# Patient Record
Sex: Female | Born: 1955 | Race: Black or African American | Hispanic: No | Smoking: Former smoker
Health system: Southern US, Community
[De-identification: ages and names within clinical notes are randomized; demographics above are authoritative.]

## PROBLEM LIST (undated history)

## (undated) DIAGNOSIS — I10 Essential (primary) hypertension: Secondary | ICD-10-CM

## (undated) DIAGNOSIS — C3491 Malignant neoplasm of unspecified part of right bronchus or lung: Secondary | ICD-10-CM

## (undated) DIAGNOSIS — E785 Hyperlipidemia, unspecified: Secondary | ICD-10-CM

## (undated) DIAGNOSIS — Z9221 Personal history of antineoplastic chemotherapy: Secondary | ICD-10-CM

## (undated) DIAGNOSIS — M1990 Unspecified osteoarthritis, unspecified site: Secondary | ICD-10-CM

## (undated) DIAGNOSIS — N951 Menopausal and female climacteric states: Secondary | ICD-10-CM

## (undated) DIAGNOSIS — Z923 Personal history of irradiation: Secondary | ICD-10-CM

## (undated) DIAGNOSIS — E039 Hypothyroidism, unspecified: Secondary | ICD-10-CM

## (undated) DIAGNOSIS — E079 Disorder of thyroid, unspecified: Secondary | ICD-10-CM

## (undated) HISTORY — DX: Menopausal and female climacteric states: N95.1

## (undated) HISTORY — PX: CYSTECTOMY: SUR359

## (undated) HISTORY — DX: Disorder of thyroid, unspecified: E07.9

## (undated) HISTORY — DX: Essential (primary) hypertension: I10

## (undated) HISTORY — DX: Hyperlipidemia, unspecified: E78.5

---

## 1977-08-05 HISTORY — PX: BREAST EXCISIONAL BIOPSY: SUR124

## 1984-08-05 HISTORY — PX: TUBAL LIGATION: SHX77

## 2003-08-06 HISTORY — PX: ABDOMINAL HYSTERECTOMY: SHX81

## 2007-12-29 ENCOUNTER — Ambulatory Visit: Payer: Self-pay | Admitting: Obstetrics and Gynecology

## 2008-01-21 ENCOUNTER — Ambulatory Visit: Payer: Self-pay | Admitting: Gastroenterology

## 2008-01-21 LAB — HM COLONOSCOPY

## 2009-01-13 ENCOUNTER — Ambulatory Visit: Payer: Self-pay | Admitting: Obstetrics and Gynecology

## 2010-10-30 ENCOUNTER — Ambulatory Visit: Payer: Self-pay

## 2011-12-10 ENCOUNTER — Ambulatory Visit: Payer: Self-pay

## 2014-11-02 ENCOUNTER — Ambulatory Visit: Payer: Self-pay

## 2014-11-02 LAB — HM MAMMOGRAPHY

## 2014-11-21 ENCOUNTER — Ambulatory Visit
Admit: 2014-11-21 | Disposition: A | Discharge status: Home / Self Care | Payer: Self-pay | Attending: Unknown Physician Specialty | Admitting: Unknown Physician Specialty

## 2015-01-16 DIAGNOSIS — D169 Benign neoplasm of bone and articular cartilage, unspecified: Secondary | ICD-10-CM | POA: Insufficient documentation

## 2015-02-03 HISTORY — PX: CYSTECTOMY: SUR359

## 2015-02-07 ENCOUNTER — Other Ambulatory Visit: Payer: Self-pay | Admitting: Unknown Physician Specialty

## 2015-02-07 NOTE — Telephone Encounter (Signed)
Please get patient an appointment for follow up on her cholesterol

## 2015-02-08 NOTE — Telephone Encounter (Signed)
Called and left the patient a voicemail to return my call and schedule a follow-up visit.

## 2015-02-09 NOTE — Telephone Encounter (Signed)
Called and spoke to patient. She stated that she would call on Monday to schedule a follow up visit for cholesterol.

## 2015-03-08 ENCOUNTER — Other Ambulatory Visit: Payer: Self-pay | Admitting: Family Medicine

## 2015-03-09 NOTE — Telephone Encounter (Signed)
Looks like your patient. Last seen in March and due in June

## 2015-04-07 ENCOUNTER — Other Ambulatory Visit: Payer: Self-pay | Admitting: Unknown Physician Specialty

## 2015-05-08 ENCOUNTER — Other Ambulatory Visit: Payer: Self-pay | Admitting: Unknown Physician Specialty

## 2015-05-08 NOTE — Telephone Encounter (Signed)
Needs seen further refills 

## 2015-06-06 ENCOUNTER — Other Ambulatory Visit: Payer: Self-pay | Admitting: Unknown Physician Specialty

## 2015-06-20 ENCOUNTER — Other Ambulatory Visit: Payer: Self-pay | Admitting: Unknown Physician Specialty

## 2015-06-21 NOTE — Telephone Encounter (Signed)
Needs seen further refills 

## 2015-07-04 ENCOUNTER — Other Ambulatory Visit: Payer: Self-pay | Admitting: Unknown Physician Specialty

## 2015-07-05 NOTE — Telephone Encounter (Signed)
Pt needs check further refills

## 2015-07-22 ENCOUNTER — Other Ambulatory Visit: Payer: Self-pay | Admitting: Unknown Physician Specialty

## 2015-08-02 ENCOUNTER — Other Ambulatory Visit: Payer: Self-pay | Admitting: Unknown Physician Specialty

## 2015-08-03 NOTE — Telephone Encounter (Signed)
Called and left patient a voicemail asking for her to please return my call.  

## 2015-08-03 NOTE — Telephone Encounter (Signed)
Pt needs to be seen

## 2015-08-03 NOTE — Telephone Encounter (Signed)
Looked in patient's chart before I tried to call again. She scheduled an appointment for 08/08/15.

## 2015-08-04 DIAGNOSIS — N951 Menopausal and female climacteric states: Secondary | ICD-10-CM

## 2015-08-04 DIAGNOSIS — E785 Hyperlipidemia, unspecified: Secondary | ICD-10-CM

## 2015-08-04 DIAGNOSIS — I10 Essential (primary) hypertension: Secondary | ICD-10-CM

## 2015-08-04 DIAGNOSIS — E039 Hypothyroidism, unspecified: Secondary | ICD-10-CM | POA: Insufficient documentation

## 2015-08-08 ENCOUNTER — Encounter: Payer: Self-pay | Admitting: Unknown Physician Specialty

## 2015-08-08 ENCOUNTER — Ambulatory Visit (INDEPENDENT_AMBULATORY_CARE_PROVIDER_SITE_OTHER): Payer: Managed Care, Other (non HMO) | Admitting: Unknown Physician Specialty

## 2015-08-08 VITALS — BP 126/74 | HR 74 | Temp 98.6°F | Ht 59.9 in | Wt 150.6 lb

## 2015-08-08 DIAGNOSIS — E039 Hypothyroidism, unspecified: Secondary | ICD-10-CM

## 2015-08-08 DIAGNOSIS — E785 Hyperlipidemia, unspecified: Secondary | ICD-10-CM

## 2015-08-08 DIAGNOSIS — I10 Essential (primary) hypertension: Secondary | ICD-10-CM

## 2015-08-08 LAB — LIPID PANEL PICCOLO, WAIVED
CHOL/HDL RATIO PICCOLO,WAIVE: 2.5 mg/dL
CHOLESTEROL PICCOLO, WAIVED: 138 mg/dL (ref <200)
HDL CHOL PICCOLO, WAIVED: 54 mg/dL — AB (ref >59)
LDL CHOL CALC PICCOLO WAIVED: 56 mg/dL (ref <100)
TRIGLYCERIDES PICCOLO,WAIVED: 138 mg/dL (ref <150)
VLDL Chol Calc Piccolo,Waive: 28 mg/dL (ref <30)

## 2015-08-08 LAB — MICROALBUMIN, URINE WAIVED
CREATININE, URINE WAIVED: 50 mg/dL (ref 10–300)
Microalb, Ur Waived: 10 mg/L (ref 0–19)
Microalb/Creat Ratio: <30 mg/g (ref <30)

## 2015-08-08 MED ORDER — LEVOTHYROXINE SODIUM 50 MCG PO TABS: 50.0000 ug | ORAL_TABLET | Freq: Every day | ORAL | Status: DC

## 2015-08-08 MED ORDER — ATORVASTATIN CALCIUM 10 MG PO TABS: 10.0000 mg | ORAL_TABLET | Freq: Every day | ORAL | Status: DC

## 2015-08-08 MED ORDER — LISINOPRIL 20 MG PO TABS
20.0000 mg | ORAL_TABLET | Freq: Every day | ORAL | Status: DC
Start: 2015-08-08 — End: 2016-04-02

## 2015-08-08 MED ORDER — FLUTICASONE PROPIONATE 50 MCG/ACT NA SUSP: 2.0000 | Freq: Every day | NASAL | Status: DC

## 2015-08-08 MED ORDER — AMLODIPINE BESYLATE 5 MG PO TABS: 5.0000 mg | ORAL_TABLET | Freq: Every day | ORAL | Status: DC

## 2015-08-08 NOTE — Assessment & Plan Note (Signed)
Await TSH

## 2015-08-08 NOTE — Assessment & Plan Note (Signed)
Stable, continue present medications.   

## 2015-08-08 NOTE — Progress Notes (Signed)
BP 126/74 mmHg  Pulse 74  Temp(Src) 98.6 F (37 C)  Ht 4' 11.9" (1.521 m)  Wt 150 lb 9.6 oz (68.312 kg)  BMI 29.53 kg/m2  SpO2 97%  LMP 08/08/2003 (Approximate)   Subjective:    Patient ID: Janet [name], female    DOB: 28-Jan-1956, 60 y.o.   MRN: [id number]  HPI: Janet [name] is a 60 y.o. female  Chief Complaint  Patient presents with  . Hyperlipidemia  . Hypertension  . Hypothyroidism  . Medication Refill    pt states she needs refill on levothyroxine   Hypertension Using medications without difficulty Average home BPs: 120's and somethimes 130's   No problems or lightheadedness No chest pain with exertion or shortness of breath No Edema   Hyperlipidemia Using medications without problems: No Muscle aches  Diet compliance: good Exercise:"at work"  Hypothyroid Denies fatigue, weight gain, and constipation    Relevant past medical, surgical, family and social history reviewed and updated as indicated. Interim medical history since our last visit reviewed. Allergies and medications reviewed and updated.  Review of Systems  Per HPI unless specifically indicated above     Objective:    BP 126/74 mmHg  Pulse 74  Temp(Src) 98.6 F (37 C)  Ht 4' 11.9" (1.521 m)  Wt 150 lb 9.6 oz (68.312 kg)  BMI 29.53 kg/m2  SpO2 97%  LMP 08/08/2003 (Approximate)  Wt Readings from Last 3 Encounters:  08/08/15 150 lb 9.6 oz (68.312 kg)  10/21/14 152 lb (68.947 kg)    Physical Exam  Constitutional: She is oriented to person, place, and time. She appears well-developed and well-nourished. No distress.  HENT:  Head: Normocephalic and atraumatic.  Eyes: Conjunctivae and lids are normal. Right eye exhibits no discharge. Left eye exhibits no discharge. No scleral icterus.  Neck: Normal range of motion. Neck supple. No JVD present. Carotid bruit is not present.  Cardiovascular: Normal rate, regular rhythm and normal heart sounds.   Pulmonary/Chest: Effort normal  and breath sounds normal.  Abdominal: Normal appearance. There is no splenomegaly or hepatomegaly.  Musculoskeletal: Normal range of motion.  Neurological: She is alert and oriented to person, place, and time.  Skin: Skin is warm, dry and intact. No rash noted. No pallor.  Psychiatric: She has a normal mood and affect. Her behavior is normal. Judgment and thought content normal.    Results for orders placed or performed in visit on 08/04/15  HM MAMMOGRAPHY  Result Value Ref Range   HM Mammogram from [facility]   HM COLONOSCOPY  Result Value Ref Range   HM Colonoscopy from [facility]       Assessment & Plan:   Problem List Items Addressed This Visit      Unprioritized   Hypertension - Primary    Stable, continue present medications.        Relevant Medications   amLODipine (NORVASC) 5 MG tablet   atorvastatin (LIPITOR) 10 MG tablet   lisinopril (PRINIVIL,ZESTRIL) 20 MG tablet   Other Relevant Orders   Microalbumin, Urine Waived   Uric acid   Comprehensive metabolic panel   Hypothyroidism    Await TSH      Relevant Medications   levothyroxine (SYNTHROID, LEVOTHROID) 50 MCG tablet   Other Relevant Orders   TSH   Hyperlipidemia    Check lipid panel      Relevant Medications   amLODipine (NORVASC) 5 MG tablet   atorvastatin (LIPITOR) 10 MG tablet   lisinopril (PRINIVIL,ZESTRIL) 20 MG  tablet   Other Relevant Orders   Lipid Panel Piccolo, Waived        Follow up plan: Return in about 6 months (around 02/05/2016) for physical.

## 2015-08-08 NOTE — Assessment & Plan Note (Signed)
Check lipid panel  

## 2015-08-09 ENCOUNTER — Encounter: Payer: Self-pay | Admitting: Unknown Physician Specialty

## 2015-08-09 LAB — COMPREHENSIVE METABOLIC PANEL
ALBUMIN: 4 g/dL (ref 3.6–4.8)
ALK PHOS: 81 IU/L (ref 39–117)
ALT: 10 IU/L (ref 0–32)
AST: 14 IU/L (ref 0–40)
Albumin/Globulin Ratio: 1.6 (ref 1.1–2.5)
BUN / CREAT RATIO: 14 (ref 11–26)
BUN: 8 mg/dL (ref 8–27)
Bilirubin Total: <0.2 mg/dL (ref 0.0–1.2)
CHLORIDE: 105 mmol/L (ref 96–106)
CO2: 23 mmol/L (ref 18–29)
Calcium: 9.2 mg/dL (ref 8.7–10.3)
Creatinine, Ser: 0.57 mg/dL (ref 0.57–1.00)
GFR calc Af Amer: 117 mL/min/{1.73_m2} (ref >59)
GFR calc non Af Amer: 101 mL/min/{1.73_m2} (ref >59)
GLUCOSE: 92 mg/dL (ref 65–99)
Globulin, Total: 2.5 g/dL (ref 1.5–4.5)
Potassium: 4.2 mmol/L (ref 3.5–5.2)
SODIUM: 143 mmol/L (ref 134–144)
Total Protein: 6.5 g/dL (ref 6.0–8.5)

## 2015-08-09 LAB — TSH: TSH: 2.39 u[IU]/mL (ref 0.450–4.500)

## 2015-08-09 LAB — URIC ACID: Uric Acid: 3.9 mg/dL (ref 2.5–7.1)

## 2015-08-21 ENCOUNTER — Other Ambulatory Visit: Payer: Self-pay | Admitting: Family Medicine

## 2015-09-04 ENCOUNTER — Other Ambulatory Visit: Payer: Self-pay | Admitting: Unknown Physician Specialty

## 2015-09-05 ENCOUNTER — Other Ambulatory Visit: Payer: Self-pay | Admitting: Unknown Physician Specialty

## 2015-09-11 ENCOUNTER — Other Ambulatory Visit: Payer: Self-pay

## 2015-09-11 MED ORDER — AMLODIPINE BESYLATE 5 MG PO TABS: 5.0000 mg | ORAL_TABLET | Freq: Every day | ORAL | Status: DC

## 2015-09-11 NOTE — Telephone Encounter (Signed)
New rx was sent to [facility] so I DC the rx at [facility].

## 2015-09-11 NOTE — Telephone Encounter (Signed)
Routing to provider. Patient was last seen 08/08/15 and has appointment 02/07/16. Pharmacy is [facility]. Can this just be called in to [place]? And DC from [place]?

## 2015-09-11 NOTE — Telephone Encounter (Signed)
Pt would like it go [facility]

## 2015-09-11 NOTE — Telephone Encounter (Signed)
We got a refill request for patient's amlodipine. It was sent to [facility] 08/08/15 for 90 days. I tried to call the patient and asked if she was getting it from [place] or if she needed it to go to [place] where the request came from. I left a voicemail asking for her to please return my call.

## 2015-09-24 ENCOUNTER — Other Ambulatory Visit: Payer: Self-pay | Admitting: Unknown Physician Specialty

## 2015-10-03 ENCOUNTER — Other Ambulatory Visit: Payer: Self-pay | Admitting: Unknown Physician Specialty

## 2015-10-13 ENCOUNTER — Other Ambulatory Visit: Payer: Self-pay | Admitting: Unknown Physician Specialty

## 2015-10-22 ENCOUNTER — Other Ambulatory Visit: Payer: Self-pay | Admitting: Unknown Physician Specialty

## 2015-11-14 ENCOUNTER — Other Ambulatory Visit: Payer: Self-pay | Admitting: Unknown Physician Specialty

## 2015-11-19 ENCOUNTER — Other Ambulatory Visit: Payer: Self-pay | Admitting: Unknown Physician Specialty

## 2015-12-13 ENCOUNTER — Other Ambulatory Visit: Payer: Self-pay | Admitting: Unknown Physician Specialty

## 2015-12-30 ENCOUNTER — Other Ambulatory Visit: Payer: Self-pay | Admitting: Unknown Physician Specialty

## 2016-01-11 ENCOUNTER — Other Ambulatory Visit: Payer: Self-pay | Admitting: Unknown Physician Specialty

## 2016-01-29 ENCOUNTER — Other Ambulatory Visit: Payer: Self-pay | Admitting: Unknown Physician Specialty

## 2016-02-07 ENCOUNTER — Encounter: Payer: Managed Care, Other (non HMO) | Admitting: Unknown Physician Specialty

## 2016-02-14 ENCOUNTER — Other Ambulatory Visit: Payer: Self-pay | Admitting: Unknown Physician Specialty

## 2016-02-27 ENCOUNTER — Other Ambulatory Visit: Payer: Self-pay | Admitting: Unknown Physician Specialty

## 2016-03-15 ENCOUNTER — Other Ambulatory Visit: Payer: Self-pay | Admitting: Unknown Physician Specialty

## 2016-03-17 ENCOUNTER — Other Ambulatory Visit: Payer: Self-pay | Admitting: Unknown Physician Specialty

## 2016-03-28 ENCOUNTER — Other Ambulatory Visit: Payer: Self-pay | Admitting: Family Medicine

## 2016-04-02 ENCOUNTER — Ambulatory Visit (INDEPENDENT_AMBULATORY_CARE_PROVIDER_SITE_OTHER): Payer: Managed Care, Other (non HMO) | Admitting: Unknown Physician Specialty

## 2016-04-02 ENCOUNTER — Encounter: Payer: Self-pay | Admitting: Unknown Physician Specialty

## 2016-04-02 VITALS — BP 139/84 | HR 55 | Temp 98.0°F | Ht 60.6 in | Wt 154.2 lb

## 2016-04-02 DIAGNOSIS — Z720 Tobacco use: Secondary | ICD-10-CM | POA: Diagnosis not present

## 2016-04-02 DIAGNOSIS — I10 Essential (primary) hypertension: Secondary | ICD-10-CM | POA: Diagnosis not present

## 2016-04-02 DIAGNOSIS — E785 Hyperlipidemia, unspecified: Secondary | ICD-10-CM

## 2016-04-02 DIAGNOSIS — Z0000 Encounter for general adult medical examination without abnormal findings: Secondary | ICD-10-CM

## 2016-04-02 DIAGNOSIS — F17200 Nicotine dependence, unspecified, uncomplicated: Secondary | ICD-10-CM | POA: Insufficient documentation

## 2016-04-02 LAB — MICROALBUMIN, URINE WAIVED
CREATININE, URINE WAIVED: 10 mg/dL (ref 10–300)
MICROALB, UR WAIVED: 10 mg/L (ref 0–19)

## 2016-04-02 MED ORDER — LISINOPRIL 20 MG PO TABS: 20.0000 mg | ORAL_TABLET | Freq: Every day | ORAL | 1 refills | Status: DC

## 2016-04-02 MED ORDER — ATORVASTATIN CALCIUM 10 MG PO TABS: 10.0000 mg | ORAL_TABLET | Freq: Every day | ORAL | 1 refills | Status: DC

## 2016-04-02 MED ORDER — LEVOTHYROXINE SODIUM 50 MCG PO TABS: 50.0000 ug | ORAL_TABLET | Freq: Every day | ORAL | 3 refills | Status: DC

## 2016-04-02 MED ORDER — AMLODIPINE BESYLATE 5 MG PO TABS: 5.0000 mg | ORAL_TABLET | Freq: Every day | ORAL | 1 refills | Status: DC

## 2016-04-02 NOTE — Progress Notes (Signed)
BP 139/84 (BP Location: Left Arm, Cuff Size: Large)   Pulse (!) 55   Temp 98 F (36.7 C)   Ht 5' 0.6" (1.539 m)   Wt 154 lb 3.2 oz (69.9 kg)   LMP 08/08/2003 (Approximate)   SpO2 98%   BMI 29.52 kg/m    Subjective:    Patient ID: Janet [name], female    DOB: [birth year], 60 y.o.   MRN: [id number]  HPI: Janet [name] is a 60 y.o. female  Chief Complaint  Patient presents with  . Annual Exam   Hypertension Using medications without difficulty Average home BPs SBP 120-130  No problems or lightheadedness No chest pain with exertion or shortness of breath No Edema   Hyperlipidemia Using medications without problems: No Muscle aches  Diet compliance: Doesn't watch what she eats Exercise: regular on job  Relevant past medical, surgical, family and social history reviewed and updated as indicated. Interim medical history since our last visit reviewed. Allergies and medications reviewed and updated.  Review of Systems  Constitutional: Negative.   HENT: Negative.   Eyes: Negative.   Respiratory: Negative.   Cardiovascular: Negative.   Gastrointestinal: Negative.   Endocrine: Negative.   Genitourinary: Negative.   Musculoskeletal: Negative.   Skin: Negative.   Allergic/Immunologic: Negative.   Neurological: Negative.   Hematological: Negative.   Psychiatric/Behavioral: Negative.     Per HPI unless specifically indicated above     Objective:    BP 139/84 (BP Location: Left Arm, Cuff Size: Large)   Pulse (!) 55   Temp 98 F (36.7 C)   Ht 5' 0.6" (1.539 m)   Wt 154 lb 3.2 oz (69.9 kg)   LMP 08/08/2003 (Approximate)   SpO2 98%   BMI 29.52 kg/m   Wt Readings from Last 3 Encounters:  04/02/16 154 lb 3.2 oz (69.9 kg)  08/08/15 150 lb 9.6 oz (68.3 kg)  10/21/14 152 lb (68.9 kg)    Physical Exam  Constitutional: She is oriented to person, place, and time. She appears well-developed and well-nourished.  HENT:  Head: Normocephalic and atraumatic.    Eyes: Pupils are equal, round, and reactive to light. Right eye exhibits no discharge. Left eye exhibits no discharge. No scleral icterus.  Neck: Normal range of motion. Neck supple. Carotid bruit is not present. No thyromegaly present.  Cardiovascular: Normal rate, regular rhythm and normal heart sounds.  Exam reveals no gallop and no friction rub.   No murmur heard. Pulmonary/Chest: Effort normal and breath sounds normal. No respiratory distress. She has no wheezes. She has no rales.  Abdominal: Soft. Bowel sounds are normal. There is no tenderness. There is no rebound.  Genitourinary: No breast swelling, tenderness or discharge.  Musculoskeletal: Normal range of motion.  Lymphadenopathy:    She has no cervical adenopathy.  Neurological: She is alert and oriented to person, place, and time.  Skin: Skin is warm, dry and intact. No rash noted.  Psychiatric: She has a normal mood and affect. Her speech is normal and behavior is normal. Judgment and thought content normal. Cognition and memory are normal.    Results for orders placed or performed in visit on 08/08/15  Lipid Panel Piccolo, [employer]  Result Value Ref Range   Cholesterol Piccolo, Waived 138 <200 mg/dL   HDL Chol Piccolo, Waived 54 (L) >59 mg/dL   Triglycerides Piccolo,Waived 138 <150 mg/dL   Chol/HDL Ratio Piccolo,Waive 2.5 mg/dL   LDL Chol Calc Piccolo Waived 56 <100 mg/dL   VLDL  Chol Calc Piccolo,Waive 28 <30 mg/dL  Microalbumin, Urine Waived  Result Value Ref Range   Microalb, Ur Waived 10 0 - 19 mg/L   Creatinine, Urine Waived 50 10 - 300 mg/dL   Microalb/Creat Ratio <30 <30 mg/g  Uric acid  Result Value Ref Range   Uric Acid 3.9 2.5 - 7.1 mg/dL  Comprehensive metabolic panel  Result Value Ref Range   Glucose 92 65 - 99 mg/dL   BUN 8 8 - 27 mg/dL   Creatinine, Ser 0.57 0.57 - 1.00 mg/dL   GFR calc non Af Amer 101 >59 mL/min/1.73   GFR calc Af Amer 117 >59 mL/min/1.73   BUN/Creatinine Ratio 14 11 - 26   Sodium  143 134 - 144 mmol/L   Potassium 4.2 3.5 - 5.2 mmol/L   Chloride 105 96 - 106 mmol/L   CO2 23 18 - 29 mmol/L   Calcium 9.2 8.7 - 10.3 mg/dL   Total Protein 6.5 6.0 - 8.5 g/dL   Albumin 4.0 3.6 - 4.8 g/dL   Globulin, Total 2.5 1.5 - 4.5 g/dL   Albumin/Globulin Ratio 1.6 1.1 - 2.5   Bilirubin Total <0.2 0.0 - 1.2 mg/dL   Alkaline Phosphatase 81 39 - 117 IU/L   AST 14 0 - 40 IU/L   ALT 10 0 - 32 IU/L  TSH  Result Value Ref Range   TSH 2.390 0.450 - 4.500 uIU/mL      Assessment & Plan:   Problem List Items Addressed This Visit      Unprioritized   Hyperlipidemia    Stable, continue present medications.        Relevant Medications   amLODipine (NORVASC) 5 MG tablet   atorvastatin (LIPITOR) 10 MG tablet   lisinopril (PRINIVIL,ZESTRIL) 20 MG tablet   Other Relevant Orders   Lipid Panel w/o Chol/HDL Ratio   Hypertension    Stable, continue present medications.        Relevant Medications   amLODipine (NORVASC) 5 MG tablet   atorvastatin (LIPITOR) 10 MG tablet   lisinopril (PRINIVIL,ZESTRIL) 20 MG tablet   Other Relevant Orders   Comprehensive metabolic panel   TSH   Microalbumin, Urine Waived   Uric acid   Smoking    Encouraged to quit and referred to the quit smoking class       Other Visit Diagnoses    Annual physical exam    -  Primary   Relevant Orders   CBC with Differential/Platelet          Follow up plan: Return in about 6 months (around 10/02/2016).

## 2016-04-02 NOTE — Patient Instructions (Addendum)
DASH Eating Plan DASH stands for "Dietary Approaches to Stop Hypertension." The DASH eating plan is a healthy eating plan that has been shown to reduce high blood pressure (hypertension). Additional health benefits may include reducing the risk of type 2 diabetes mellitus, heart disease, and stroke. The DASH eating plan may also help with weight loss. WHAT DO I NEED TO KNOW ABOUT THE DASH EATING PLAN? For the DASH eating plan, you will follow these general guidelines:  Choose foods with a percent daily value for sodium of less than 5% (as listed on the food label).  Use salt-free seasonings or herbs instead of table salt or sea salt.  Check with your health care provider or pharmacist before using salt substitutes.  Eat lower-sodium products, often labeled as "lower sodium" or "no salt added."  Eat fresh foods.  Eat more vegetables, fruits, and low-fat dairy products.  Choose whole grains. Look for the word "whole" as the first word in the ingredient list.  Choose fish and skinless chicken or turkey more often than red meat. Limit fish, poultry, and meat to 6 oz (170 g) each day.  Limit sweets, desserts, sugars, and sugary drinks.  Choose heart-healthy fats.  Limit cheese to 1 oz (28 g) per day.  Eat more home-cooked food and less restaurant, buffet, and fast food.  Limit fried foods.  Cook foods using methods other than frying.  Limit canned vegetables. If you do use them, rinse them well to decrease the sodium.  When eating at a restaurant, ask that your food be prepared with less salt, or no salt if possible. WHAT FOODS CAN I EAT? Seek help from a dietitian for individual calorie needs. Grains Whole grain or whole wheat bread. Brown rice. Whole grain or whole wheat pasta. Quinoa, bulgur, and whole grain cereals. Low-sodium cereals. Corn or whole wheat flour tortillas. Whole grain cornbread. Whole grain crackers. Low-sodium crackers. Vegetables Fresh or frozen vegetables  (raw, steamed, roasted, or grilled). Low-sodium or reduced-sodium tomato and vegetable juices. Low-sodium or reduced-sodium tomato sauce and paste. Low-sodium or reduced-sodium canned vegetables.  Fruits All fresh, canned (in natural juice), or frozen fruits. Meat and Other Protein Products Ground beef (85% or leaner), grass-fed beef, or beef trimmed of fat. Skinless chicken or turkey. Ground chicken or turkey. Pork trimmed of fat. All fish and seafood. Eggs. Dried beans, peas, or lentils. Unsalted nuts and seeds. Unsalted canned beans. Dairy Low-fat dairy products, such as skim or 1% milk, 2% or reduced-fat cheeses, low-fat ricotta or cottage cheese, or plain low-fat yogurt. Low-sodium or reduced-sodium cheeses. Fats and Oils Tub margarines without trans fats. Light or reduced-fat mayonnaise and salad dressings (reduced sodium). Avocado. Safflower, olive, or canola oils. Natural peanut or almond butter. Other Unsalted popcorn and pretzels. The items listed above may not be a complete list of recommended foods or beverages. Contact your dietitian for more options. WHAT FOODS ARE NOT RECOMMENDED? Grains White bread. White pasta. White rice. Refined cornbread. Bagels and croissants. Crackers that contain trans fat. Vegetables Creamed or fried vegetables. Vegetables in a cheese sauce. Regular canned vegetables. Regular canned tomato sauce and paste. Regular tomato and vegetable juices. Fruits Dried fruits. Canned fruit in light or heavy syrup. Fruit juice. Meat and Other Protein Products Fatty cuts of meat. Ribs, chicken wings, bacon, sausage, bologna, salami, chitterlings, fatback, hot dogs, bratwurst, and packaged luncheon meats. Salted nuts and seeds. Canned beans with salt. Dairy Whole or 2% milk, cream, half-and-half, and cream cheese. Whole-fat or sweetened yogurt. Full-fat   cheeses or blue cheese. Nondairy creamers and whipped toppings. Processed cheese, cheese spreads, or cheese  curds. Condiments Onion and garlic salt, seasoned salt, table salt, and sea salt. Canned and packaged gravies. Worcestershire sauce. Tartar sauce. Barbecue sauce. Teriyaki sauce. Soy sauce, including reduced sodium. Steak sauce. Fish sauce. Oyster sauce. Cocktail sauce. Horseradish. Ketchup and mustard. Meat flavorings and tenderizers. Bouillon cubes. Hot sauce. Tabasco sauce. Marinades. Taco seasonings. Relishes. Fats and Oils Butter, stick margarine, lard, shortening, ghee, and bacon fat. Coconut, palm kernel, or palm oils. Regular salad dressings. Other Pickles and olives. Salted popcorn and pretzels. The items listed above may not be a complete list of foods and beverages to avoid. Contact your dietitian for more information. WHERE CAN I FIND MORE INFORMATION? National Heart, Lung, and Blood Institute: travelstabloid.com   This information is not intended to replace advice given to you by your health care provider. Make sure you discuss any questions you have with your health care provider.   Document Released: 07/11/2011 Document Revised: 08/12/2014 Document Reviewed: 05/26/2013 Elsevier Interactive Patient Education 2016 Elsevier Inc. Tobacco Use Disorder Tobacco use disorder (TUD) is a mental disorder. It is the long-term use of tobacco in spite of related health problems or difficulty with normal life activities. Tobacco is most commonly smoked as cigarettes and less commonly as cigars or pipes. Smokeless chewing tobacco and snuff are also popular. People with TUD get a feeling of extreme pleasure (euphoria) from using tobacco and have a desire to use it again and again. Repeated use of tobacco can cause problems. The addictive effects of tobacco are due mainly tothe ingredient nicotine. Nicotine also causes a rush of adrenaline (epinephrine) in the body. This leads to increased blood pressure, heart rate, and breathing rate. These changes may cause  problems for people with high blood pressure, weak hearts, or lung disease. High doses of nicotine in children and pets can lead to seizures and death.  Tobacco contains a number of other unsafe chemicals. These chemicals are especially harmful when inhaled as smoke and can damage almost every organ in the body. Smokers live shorter lives than nonsmokers and are at risk of dying from a number of diseases and cancers. Tobacco smoke can also cause health problems for nonsmokers (due to inhaling secondhand smoke). Smoking is also a fire hazard.  TUD usually starts in the late teenage years and is most common in young adults between the ages of 61 and 82 years. People who start smoking earlier in life are more likely to continue smoking as adults. TUD is somewhat more common in men than women. People with TUD are at higher risk for using alcohol and other drugs of abuse. RISK FACTORS Risk factors for TUD include:   Having family members with the disorder.  Being around people who use tobacco.  Having an existing mental health issue such as schizophrenia, depression, bipolar disorder, ADHD, or posttraumatic stress disorder (PTSD). SIGNS AND SYMPTOMS  People with tobacco use disorder have two or more of the following signs and symptoms within 12 months:   Use of more tobacco over a longer period than intended.   Not able to cut down or control tobacco use.   A lot of time spent obtaining or using tobacco.   Strong desire or urge to use tobacco (craving). Cravings may last for 6 months or longer after quitting.  Use of tobacco even when use leads to major problems at work, school, or home.   Use of tobacco even when  use leads to relationship problems.   Giving up or cutting down on important life activities because of tobacco use.   Repeatedly using tobacco in situations where it puts you or others in physical danger, like smoking in bed.   Use of tobacco even when it is known that a  physical or mental problem is likely related to tobacco use.   Physical problems are numerous and may include chronic bronchitis, emphysema, lung and other cancers, gum disease, high blood pressure, heart disease, and stroke.   Mental problems caused by tobacco may include difficulty sleeping and anxiety.  Need to use greater amounts of tobacco to get the same effect. This means you have developed a tolerance.   Withdrawal symptoms as a result of stopping or rapidly cutting back use. These symptoms may last a month or more after quitting and include the following:   Depressed, anxious, or irritable mood.   Difficulty concentrating.   Increased appetite.  Restlessness or trouble sleeping.   Use of tobacco to avoid withdrawal symptoms. DIAGNOSIS  Tobacco use disorder is diagnosed by your health care provider. A diagnosis may be made by:  Your health care provider asking questions about your tobacco use and any problems it may be causing.  A physical exam.  Lab tests.  You may be referred to a mental health professional or addiction specialist. The severity of tobacco use disorder depends on the number of signs and symptoms you have:   Mild--Two or three symptoms.  Moderate--Four or five symptoms.   Severe--Six or more symptoms.  TREATMENT  Many people with tobacco use disorder are unable to quit on their own and need help. Treatment options include the following:  Nicotine replacement therapy (NRT). NRT provides nicotine without the other harmful chemicals in tobacco. NRT gradually lowers the dosage of nicotine in the body and reduces withdrawal symptoms. NRT is available in over-the-counter forms (gum, lozenges, and skin patches) as well as prescription forms (mouth inhaler and nasal spray).  Medicines.This may include:  Antidepressant medicine that may reduce nicotine cravings.  A medicine that acts on nicotine receptors in the brain to reduce cravings and  withdrawal symptoms. It may also block the effects of tobacco in people with TUD who relapse.  Counseling or talk therapy. A form of talk therapy called behavioral therapy is commonly used to treat people with TUD. Behavioral therapy looks at triggers for tobacco use, how to avoid them, and how to cope with cravings. It is most effective in person or by phone but is also available in self-help forms (books and Internet websites).  Support groups. These provide emotional support, advice, and guidance for quitting tobacco. The most effective treatment for TUD is usually a combination of medicine, talk therapy, and support groups. HOME CARE INSTRUCTIONS  Keep all follow-up visits as directed by your health care provider. This is important.  Take medicines only as directed by your health care provider.  Check with your health care provider before starting new prescription or over-the-counter medicines. SEEK MEDICAL CARE IF:  You are not able to take your medicines as prescribed.  Treatment is not helping your TUD and your symptoms get worse. SEEK IMMEDIATE MEDICAL CARE IF:  You have serious thoughts about hurting yourself or others.  You have trouble breathing, chest pain, sudden weakness, or sudden numbness in part of your body.   This information is not intended to replace advice given to you by your health care provider. Make sure you discuss any questions  you have with your health care provider.   Document Released: 03/27/2004 Document Revised: 08/12/2014 Document Reviewed: 09/17/2013 Elsevier Interactive Patient Education 2016 [place] smoking class [phone]

## 2016-04-02 NOTE — Assessment & Plan Note (Signed)
Stable, continue present medications.   

## 2016-04-02 NOTE — Assessment & Plan Note (Signed)
Encouraged to quit and referred to the quit smoking class

## 2016-04-02 NOTE — Progress Notes (Signed)
   BP (!) 146/74 (BP Location: Left Arm, Patient Position: Sitting, Cuff Size: Large)   Pulse (!) 56   Temp 98 F (36.7 C)   Ht 5' 0.6" (1.539 m)   Wt 154 lb 3.2 oz (69.9 kg)   LMP 08/08/2003 (Approximate)   SpO2 98%   BMI 29.52 kg/m    Subjective:    Patient ID: Janet [name], female    DOB: 07-Apr-1956, 60 y.o.   MRN: [id number]  HPI: Janet [name] is a 60 y.o. female  No chief complaint on file.   Relevant past medical, surgical, family and social history reviewed and updated as indicated. Interim medical history since our last visit reviewed. Allergies and medications reviewed and updated.  Review of Systems  Per HPI unless specifically indicated above     Objective:    BP (!) 146/74 (BP Location: Left Arm, Patient Position: Sitting, Cuff Size: Large)   Pulse (!) 56   Temp 98 F (36.7 C)   Ht 5' 0.6" (1.539 m)   Wt 154 lb 3.2 oz (69.9 kg)   LMP 08/08/2003 (Approximate)   SpO2 98%   BMI 29.52 kg/m   Wt Readings from Last 3 Encounters:  04/02/16 154 lb 3.2 oz (69.9 kg)  08/08/15 150 lb 9.6 oz (68.3 kg)  10/21/14 152 lb (68.9 kg)    Physical Exam  Results for orders placed or performed in visit on 08/08/15  Lipid Panel Piccolo, [employer]  Result Value Ref Range   Cholesterol Piccolo, Waived 138 <200 mg/dL   HDL Chol Piccolo, Waived 54 (L) >59 mg/dL   Triglycerides Piccolo,Waived 138 <150 mg/dL   Chol/HDL Ratio Piccolo,Waive 2.5 mg/dL   LDL Chol Calc Piccolo Waived 56 <100 mg/dL   VLDL Chol Calc Piccolo,Waive 28 <30 mg/dL  Microalbumin, Urine Waived  Result Value Ref Range   Microalb, Ur Waived 10 0 - 19 mg/L   Creatinine, Urine Waived 50 10 - 300 mg/dL   Microalb/Creat Ratio <30 <30 mg/g  Uric acid  Result Value Ref Range   Uric Acid 3.9 2.5 - 7.1 mg/dL  Comprehensive metabolic panel  Result Value Ref Range   Glucose 92 65 - 99 mg/dL   BUN 8 8 - 27 mg/dL   Creatinine, Ser 0.57 0.57 - 1.00 mg/dL   GFR calc non Af Amer 101 >59 mL/min/1.73   GFR  calc Af Amer 117 >59 mL/min/1.73   BUN/Creatinine Ratio 14 11 - 26   Sodium 143 134 - 144 mmol/L   Potassium 4.2 3.5 - 5.2 mmol/L   Chloride 105 96 - 106 mmol/L   CO2 23 18 - 29 mmol/L   Calcium 9.2 8.7 - 10.3 mg/dL   Total Protein 6.5 6.0 - 8.5 g/dL   Albumin 4.0 3.6 - 4.8 g/dL   Globulin, Total 2.5 1.5 - 4.5 g/dL   Albumin/Globulin Ratio 1.6 1.1 - 2.5   Bilirubin Total <0.2 0.0 - 1.2 mg/dL   Alkaline Phosphatase 81 39 - 117 IU/L   AST 14 0 - 40 IU/L   ALT 10 0 - 32 IU/L  TSH  Result Value Ref Range   TSH 2.390 0.450 - 4.500 uIU/mL      Assessment & Plan:   Problem List Items Addressed This Visit    None    Visit Diagnoses   None.      Follow up plan: No Follow-up on file.

## 2016-04-03 ENCOUNTER — Encounter: Payer: Self-pay | Admitting: Family Medicine

## 2016-04-03 ENCOUNTER — Telehealth: Payer: Self-pay | Admitting: Family Medicine

## 2016-04-03 DIAGNOSIS — E876 Hypokalemia: Secondary | ICD-10-CM

## 2016-04-03 LAB — COMPREHENSIVE METABOLIC PANEL
ALBUMIN: 4.1 g/dL (ref 3.6–4.8)
ALT: 10 IU/L (ref 0–32)
AST: 14 IU/L (ref 0–40)
Albumin/Globulin Ratio: 1.6 (ref 1.2–2.2)
Alkaline Phosphatase: 82 IU/L (ref 39–117)
BUN / CREAT RATIO: 11 — AB (ref 12–28)
BUN: 6 mg/dL — AB (ref 8–27)
Bilirubin Total: 0.4 mg/dL (ref 0.0–1.2)
CALCIUM: 9.3 mg/dL (ref 8.7–10.3)
CO2: 27 mmol/L (ref 18–29)
CREATININE: 0.56 mg/dL — AB (ref 0.57–1.00)
Chloride: 103 mmol/L (ref 96–106)
GFR, EST AFRICAN AMERICAN: 117 mL/min/{1.73_m2} (ref >59)
GFR, EST NON AFRICAN AMERICAN: 102 mL/min/{1.73_m2} (ref >59)
GLOBULIN, TOTAL: 2.6 g/dL (ref 1.5–4.5)
GLUCOSE: 89 mg/dL (ref 65–99)
Potassium: 3.4 mmol/L — ABNORMAL LOW (ref 3.5–5.2)
Sodium: 144 mmol/L (ref 134–144)
TOTAL PROTEIN: 6.7 g/dL (ref 6.0–8.5)

## 2016-04-03 LAB — CBC WITH DIFFERENTIAL/PLATELET
BASOS: 1 %
Basophils Absolute: 0 10*3/uL (ref 0.0–0.2)
EOS (ABSOLUTE): 0.2 10*3/uL (ref 0.0–0.4)
EOS: 3 %
HEMATOCRIT: 38.7 % (ref 34.0–46.6)
HEMOGLOBIN: 12.9 g/dL (ref 11.1–15.9)
Immature Grans (Abs): 0 10*3/uL (ref 0.0–0.1)
Immature Granulocytes: 0 %
LYMPHS ABS: 2.4 10*3/uL (ref 0.7–3.1)
Lymphs: 46 %
MCH: 31.3 pg (ref 26.6–33.0)
MCHC: 33.3 g/dL (ref 31.5–35.7)
MCV: 94 fL (ref 79–97)
MONOCYTES: 6 %
Monocytes Absolute: 0.3 10*3/uL (ref 0.1–0.9)
NEUTROS ABS: 2.3 10*3/uL (ref 1.4–7.0)
Neutrophils: 44 %
Platelets: 333 10*3/uL (ref 150–379)
RBC: 4.12 x10E6/uL (ref 3.77–5.28)
RDW: 13.8 % (ref 12.3–15.4)
WBC: 5.3 10*3/uL (ref 3.4–10.8)

## 2016-04-03 LAB — LIPID PANEL W/O CHOL/HDL RATIO
Cholesterol, Total: 143 mg/dL (ref 100–199)
HDL: 56 mg/dL (ref >39)
LDL CALC: 70 mg/dL (ref 0–99)
TRIGLYCERIDES: 84 mg/dL (ref 0–149)
VLDL Cholesterol Cal: 17 mg/dL (ref 5–40)

## 2016-04-03 LAB — TSH: TSH: 2.83 u[IU]/mL (ref 0.450–4.500)

## 2016-04-03 LAB — URIC ACID: Uric Acid: 3.8 mg/dL (ref 2.5–7.1)

## 2016-04-03 NOTE — Telephone Encounter (Signed)
Left message to call.

## 2016-04-03 NOTE — Telephone Encounter (Signed)
Please have her come in at her own convenience next week to recheck metabolic panel as her potassium was a bit low - stay well hydrated and eat lots of fruit in the meantime to help restore levels. I will put in the lab.

## 2016-04-05 NOTE — Telephone Encounter (Signed)
Left message to call.

## 2016-04-09 NOTE — Telephone Encounter (Signed)
Left message to call.

## 2016-04-10 ENCOUNTER — Encounter: Payer: Self-pay | Admitting: Family Medicine

## 2016-04-10 NOTE — Telephone Encounter (Signed)
Could not reach by phone after multiple attempts. Letter sent through the mail.

## 2016-04-26 ENCOUNTER — Other Ambulatory Visit: Payer: Self-pay | Admitting: Unknown Physician Specialty

## 2016-05-24 NOTE — Telephone Encounter (Signed)
Your patient 

## 2016-10-04 ENCOUNTER — Other Ambulatory Visit: Payer: Self-pay | Admitting: Unknown Physician Specialty

## 2016-10-04 ENCOUNTER — Ambulatory Visit: Payer: Managed Care, Other (non HMO) | Admitting: Unknown Physician Specialty

## 2016-10-07 ENCOUNTER — Ambulatory Visit (INDEPENDENT_AMBULATORY_CARE_PROVIDER_SITE_OTHER): Payer: Managed Care, Other (non HMO) | Admitting: Unknown Physician Specialty

## 2016-10-07 ENCOUNTER — Encounter: Payer: Self-pay | Admitting: Unknown Physician Specialty

## 2016-10-07 VITALS — BP 150/82 | HR 64 | Temp 98.5°F | Ht 60.7 in | Wt 156.5 lb

## 2016-10-07 DIAGNOSIS — E785 Hyperlipidemia, unspecified: Secondary | ICD-10-CM

## 2016-10-07 DIAGNOSIS — M62838 Other muscle spasm: Secondary | ICD-10-CM | POA: Diagnosis not present

## 2016-10-07 DIAGNOSIS — I10 Essential (primary) hypertension: Secondary | ICD-10-CM

## 2016-10-07 MED ORDER — LISINOPRIL 20 MG PO TABS: 20.0000 mg | ORAL_TABLET | Freq: Every day | ORAL | 1 refills | Status: DC

## 2016-10-07 MED ORDER — CYCLOBENZAPRINE HCL 10 MG PO TABS: 10.0000 mg | ORAL_TABLET | Freq: Three times a day (TID) | ORAL | 0 refills | Status: DC | PRN

## 2016-10-07 MED ORDER — AMLODIPINE BESYLATE 5 MG PO TABS: 5.0000 mg | ORAL_TABLET | Freq: Every day | ORAL | 1 refills | Status: DC

## 2016-10-07 MED ORDER — ATORVASTATIN CALCIUM 10 MG PO TABS: 10.0000 mg | ORAL_TABLET | Freq: Every day | ORAL | 1 refills | Status: DC

## 2016-10-07 NOTE — Progress Notes (Signed)
BP (!) 150/82 (BP Location: Left Arm, Cuff Size: Large)   Pulse 64   Temp 98.5 F (36.9 C)   Ht 5' 0.7" (1.542 m) Comment: pt had shoes on  Wt 156 lb 8 oz (71 kg) Comment: pt had shoes on  LMP 08/08/2003 (Approximate)   SpO2 97%   BMI 29.86 kg/m    Subjective:    Patient ID: Janet [name], female    DOB: 09-Oct-1955, 60 y.o.   MRN: [id number]  HPI: Janet [name] is a 61 y.o. female  Chief Complaint  Patient presents with  . Hyperlipidemia  . Hypertension  . Hypothyroidism  . Spasms    pt states she thinks she may be having muscle spasms in right shoulder, states it has been going on for about a week. States her job is very physical and wonders if this could be the reason.    Hypertension Using medications without difficulty Average home BPs SBP 120-130       No problems or lightheadedness No chest pain with exertion or shortness of breath No Edema  Hyperlipidemia Using medications without problems: No Muscle aches  Diet compliance: Doesn't watch what she eats Exercise: On the job  Muscle spasm In right shoulder for about 1 week.  States this started at work.  States it is works after working her physical job.    Relevant past medical, surgical, family and social history reviewed and updated as indicated. Interim medical history since our last visit reviewed. Allergies and medications reviewed and updated.  Review of Systems  Per HPI unless specifically indicated above     Objective:    BP (!) 150/82 (BP Location: Left Arm, Cuff Size: Large)   Pulse 64   Temp 98.5 F (36.9 C)   Ht 5' 0.7" (1.542 m) Comment: pt had shoes on  Wt 156 lb 8 oz (71 kg) Comment: pt had shoes on  LMP 08/08/2003 (Approximate)   SpO2 97%   BMI 29.86 kg/m   Wt Readings from Last 3 Encounters:  10/07/16 156 lb 8 oz (71 kg)  04/02/16 154 lb 3.2 oz (69.9 kg)  08/08/15 150 lb 9.6 oz (68.3 kg)    Physical Exam  Constitutional: She is oriented to person, place, and time.  She appears well-developed and well-nourished. No distress.  HENT:  Head: Normocephalic and atraumatic.  Eyes: Conjunctivae and lids are normal. Right eye exhibits no discharge. Left eye exhibits no discharge. No scleral icterus.  Neck: Normal range of motion. Neck supple. No JVD present. Carotid bruit is not present.  Cardiovascular: Normal rate, regular rhythm and normal heart sounds.   Pulmonary/Chest: Effort normal and breath sounds normal.  Abdominal: Normal appearance. There is no splenomegaly or hepatomegaly.  Musculoskeletal: Normal range of motion.  Neurological: She is alert and oriented to person, place, and time.  Skin: Skin is warm, dry and intact. No rash noted. No pallor.  Psychiatric: She has a normal mood and affect. Her behavior is normal. Judgment and thought content normal.       Assessment & Plan:   Problem List Items Addressed This Visit      Unprioritized   Hyperlipidemia    Check lipid panel      Relevant Medications   atorvastatin (LIPITOR) 10 MG tablet   amLODipine (NORVASC) 5 MG tablet   lisinopril (PRINIVIL,ZESTRIL) 20 MG tablet   Other Relevant Orders   Lipid Panel w/o Chol/HDL Ratio   Hypertension    High here but good  numbers at home.  Continue present meds      Relevant Medications   atorvastatin (LIPITOR) 10 MG tablet   amLODipine (NORVASC) 5 MG tablet   lisinopril (PRINIVIL,ZESTRIL) 20 MG tablet   Other Relevant Orders   Comprehensive metabolic panel    Other Visit Diagnoses    Muscle spasm of right shoulder    -  Primary   Relevant Medications   cyclobenzaprine (FLEXERIL) 10 MG tablet       Follow up plan: Return in about 6 months (around 04/09/2017) for physical.

## 2016-10-07 NOTE — Assessment & Plan Note (Signed)
Check lipid panel  

## 2016-10-07 NOTE — Assessment & Plan Note (Addendum)
High here but good numbers at home.  Continue present meds

## 2016-10-08 ENCOUNTER — Encounter: Payer: Self-pay | Admitting: Unknown Physician Specialty

## 2016-10-08 LAB — LIPID PANEL W/O CHOL/HDL RATIO
Cholesterol, Total: 157 mg/dL (ref 100–199)
HDL: 54 mg/dL (ref >39)
LDL Calculated: 87 mg/dL (ref 0–99)
Triglycerides: 82 mg/dL (ref 0–149)
VLDL Cholesterol Cal: 16 mg/dL (ref 5–40)

## 2016-10-08 LAB — COMPREHENSIVE METABOLIC PANEL
ALK PHOS: 83 IU/L (ref 39–117)
ALT: 12 IU/L (ref 0–32)
AST: 13 IU/L (ref 0–40)
Albumin/Globulin Ratio: 1.7 (ref 1.2–2.2)
Albumin: 4.3 g/dL (ref 3.6–4.8)
BILIRUBIN TOTAL: 0.2 mg/dL (ref 0.0–1.2)
BUN/Creatinine Ratio: 14 (ref 12–28)
BUN: 8 mg/dL (ref 8–27)
CHLORIDE: 104 mmol/L (ref 96–106)
CO2: 26 mmol/L (ref 18–29)
Calcium: 9.1 mg/dL (ref 8.7–10.3)
Creatinine, Ser: 0.57 mg/dL (ref 0.57–1.00)
GFR calc non Af Amer: 100 mL/min/{1.73_m2} (ref >59)
GFR, EST AFRICAN AMERICAN: 116 mL/min/{1.73_m2} (ref >59)
GLUCOSE: 91 mg/dL (ref 65–99)
Globulin, Total: 2.5 g/dL (ref 1.5–4.5)
Potassium: 3.9 mmol/L (ref 3.5–5.2)
Sodium: 143 mmol/L (ref 134–144)
TOTAL PROTEIN: 6.8 g/dL (ref 6.0–8.5)

## 2016-12-02 ENCOUNTER — Telehealth: Payer: Self-pay

## 2016-12-02 NOTE — Telephone Encounter (Signed)
Received faxes from [facility] requesting 90 day supplies of medications be sent in to them. Tried calling patient to see if she is using [facility] now because it is not listed as a preferred pharmacy. Patient did not answer my call so I left a VM asking for her to please return my call.

## 2016-12-03 NOTE — Telephone Encounter (Signed)
Called and left patient a VM asking for her to please return my call.  

## 2016-12-06 NOTE — Telephone Encounter (Signed)
Called and left patient a VM asking for her to please return my call. Will also send patient a letter asking for her to please let us know about her pharmacy.

## 2016-12-10 ENCOUNTER — Other Ambulatory Visit: Payer: Self-pay | Admitting: Unknown Physician Specialty

## 2016-12-10 MED ORDER — LEVOTHYROXINE SODIUM 50 MCG PO TABS: 50.0000 ug | ORAL_TABLET | Freq: Every day | ORAL | 3 refills | Status: DC

## 2016-12-10 MED ORDER — LISINOPRIL 20 MG PO TABS: 20.0000 mg | ORAL_TABLET | Freq: Every day | ORAL | 1 refills | Status: DC

## 2016-12-10 MED ORDER — AMLODIPINE BESYLATE 5 MG PO TABS: 5.0000 mg | ORAL_TABLET | Freq: Every day | ORAL | 1 refills | Status: DC

## 2016-12-10 MED ORDER — ATORVASTATIN CALCIUM 10 MG PO TABS: 10.0000 mg | ORAL_TABLET | Freq: Every day | ORAL | 1 refills | Status: DC

## 2016-12-10 NOTE — Telephone Encounter (Signed)
Patient called in regards to a call she received yesterday from the office. Patient stated that she would like for any medication refills to be sent to [facility]. Please Advise.   Patient contact: [phone]  Thank you.

## 2016-12-10 NOTE — Telephone Encounter (Signed)
Patient needs all meds sent to [facility]. Pharmacy updated in chart.

## 2017-04-04 ENCOUNTER — Ambulatory Visit (INDEPENDENT_AMBULATORY_CARE_PROVIDER_SITE_OTHER): Payer: Managed Care, Other (non HMO) | Admitting: Unknown Physician Specialty

## 2017-04-04 ENCOUNTER — Encounter: Payer: Self-pay | Admitting: Unknown Physician Specialty

## 2017-04-04 VITALS — BP 150/65 | HR 71 | Temp 98.2°F | Ht 59.5 in | Wt 143.6 lb

## 2017-04-04 DIAGNOSIS — R634 Abnormal weight loss: Secondary | ICD-10-CM | POA: Diagnosis not present

## 2017-04-04 DIAGNOSIS — E785 Hyperlipidemia, unspecified: Secondary | ICD-10-CM

## 2017-04-04 DIAGNOSIS — Z0000 Encounter for general adult medical examination without abnormal findings: Secondary | ICD-10-CM | POA: Diagnosis not present

## 2017-04-04 DIAGNOSIS — I10 Essential (primary) hypertension: Secondary | ICD-10-CM | POA: Diagnosis not present

## 2017-04-04 DIAGNOSIS — E039 Hypothyroidism, unspecified: Secondary | ICD-10-CM

## 2017-04-04 DIAGNOSIS — K219 Gastro-esophageal reflux disease without esophagitis: Secondary | ICD-10-CM | POA: Insufficient documentation

## 2017-04-04 MED ORDER — OMEPRAZOLE 20 MG PO CPDR
20.0000 mg | DELAYED_RELEASE_CAPSULE | Freq: Every day | ORAL | 3 refills | Status: DC
Start: 2017-04-04 — End: 2017-05-13

## 2017-04-04 NOTE — Assessment & Plan Note (Addendum)
Order chest x-ray.  Check labs.  Refer to GI for further evaluation

## 2017-04-04 NOTE — Assessment & Plan Note (Signed)
Weight loss.  Check TSH

## 2017-04-04 NOTE — Progress Notes (Signed)
BP (!) 150/65   Pulse 71   Temp 98.2 F (36.8 C)   Ht 4' 11.5" (1.511 m)   Wt 143 lb 9.6 oz (65.1 kg)   LMP 08/08/2003 (Approximate)   SpO2 98%   BMI 28.52 kg/m    Subjective:    Patient ID: Janet [name], female    DOB: 06-May-1956, 62 y.o.   MRN: [id number]  HPI: Janet [name] is a 61 y.o. female  Chief Complaint  Patient presents with  . Annual Exam    pt states she has been having trouble with light headedness and poor appetite   Hypothyroid She has lost a lot of weight since last seen.  She quit smoking since last seen.    Hypertension Using medications without difficulty Average home BPs 527'P-824'M systolic   Lightheaded when going from sitting to standing No chest pain with exertion or shortness of breath No Edema  Hyperlipidemia Using medications without problems: No Muscle aches  Diet compliance: Eating less due to dental problems Exercise:works  GERD Went to [facility] due to "something stuck" and Ranitidine 150 mg BID given not helping.    Depression screen [facility] 2/9 04/04/2017 04/02/2016  Decreased Interest 3 0  Down, Depressed, Hopeless 1 0  PHQ - 2 Score 4 0  Altered sleeping 1 -  Tired, decreased energy 2 -  Change in appetite 2 -  Feeling bad or failure about yourself  2 -  Trouble concentrating 0 -  Moving slowly or fidgety/restless 0 -  Suicidal thoughts 0 -  PHQ-9 Score 11 -     Social History   Social History  . Marital status: Married    Spouse name: N/A  . Number of children: N/A  . Years of education: N/A   Occupational History  . Not on file.   Social History Main Topics  . Smoking status: Former Smoker    Packs/day: 0.25    Types: Cigarettes    Quit date: 02/01/2017  . Smokeless tobacco: Never Used  . Alcohol use No  . Drug use: No  . Sexual activity: Not Currently   Other Topics Concern  . Not on file   Social History Narrative  . No narrative on file   Family History  Problem Relation Age of Onset  .  Cancer Mother        leukemia  . Hypertension Mother   . Stroke Mother   . Stroke Father   . Pneumonia Father   . Diabetes Sister   . Hyperlipidemia Sister    Past Medical History:  Diagnosis Date  . Hyperlipidemia   . Hypertension   . Menopausal state   . Thyroid disease    Past Surgical History:  Procedure Laterality Date  . ABDOMINAL HYSTERECTOMY  2005  . CYSTECTOMY Right    breast  . CYSTECTOMY  02/2015   back of neck  . TUBAL LIGATION  1986    Relevant past medical, surgical, family and social history reviewed and updated as indicated. Interim medical history since our last visit reviewed. Allergies and medications reviewed and updated.  Review of Systems  Constitutional: Negative.   HENT: Negative.   Eyes: Negative.   Respiratory: Negative for shortness of breath.   Cardiovascular: Negative.   Gastrointestinal: Negative.   Musculoskeletal: Negative.   Psychiatric/Behavioral: Negative.     Per HPI unless specifically indicated above     Objective:    BP (!) 150/65   Pulse 71   Temp 98.2  F (36.8 C)   Ht 4' 11.5" (1.511 m)   Wt 143 lb 9.6 oz (65.1 kg)   LMP 08/08/2003 (Approximate)   SpO2 98%   BMI 28.52 kg/m   Wt Readings from Last 3 Encounters:  04/04/17 143 lb 9.6 oz (65.1 kg)  10/07/16 156 lb 8 oz (71 kg)  04/02/16 154 lb 3.2 oz (69.9 kg)    Physical Exam  Constitutional: She is oriented to person, place, and time. She appears well-developed and well-nourished.  HENT:  Head: Normocephalic and atraumatic.  Eyes: Pupils are equal, round, and reactive to light. Right eye exhibits no discharge. Left eye exhibits no discharge. No scleral icterus.  Neck: Normal range of motion. Neck supple. Carotid bruit is not present. No thyromegaly present.  Cardiovascular: Normal rate, regular rhythm and normal heart sounds.  Exam reveals no gallop and no friction rub.   No murmur heard. Pulmonary/Chest: Effort normal and breath sounds normal. No respiratory  distress. She has no wheezes. She has no rales.  Abdominal: Soft. Bowel sounds are normal. There is no tenderness. There is no rebound.  Genitourinary: No breast swelling, tenderness or discharge.  Musculoskeletal: Normal range of motion.  Lymphadenopathy:    She has no cervical adenopathy.  Neurological: She is alert and oriented to person, place, and time.  Skin: Skin is warm, dry and intact. No rash noted.  Psychiatric: She has a normal mood and affect. Her speech is normal and behavior is normal. Judgment and thought content normal. Cognition and memory are normal.    Results for orders placed or performed in visit on 10/07/16  Comprehensive metabolic panel  Result Value Ref Range   Glucose 91 65 - 99 mg/dL   BUN 8 8 - 27 mg/dL   Creatinine, Ser 0.57 0.57 - 1.00 mg/dL   GFR calc non Af Amer 100 >59 mL/min/1.73   GFR calc Af Amer 116 >59 mL/min/1.73   BUN/Creatinine Ratio 14 12 - 28   Sodium 143 134 - 144 mmol/L   Potassium 3.9 3.5 - 5.2 mmol/L   Chloride 104 96 - 106 mmol/L   CO2 26 18 - 29 mmol/L   Calcium 9.1 8.7 - 10.3 mg/dL   Total Protein 6.8 6.0 - 8.5 g/dL   Albumin 4.3 3.6 - 4.8 g/dL   Globulin, Total 2.5 1.5 - 4.5 g/dL   Albumin/Globulin Ratio 1.7 1.2 - 2.2   Bilirubin Total 0.2 0.0 - 1.2 mg/dL   Alkaline Phosphatase 83 39 - 117 IU/L   AST 13 0 - 40 IU/L   ALT 12 0 - 32 IU/L  Lipid Panel w/o Chol/HDL Ratio  Result Value Ref Range   Cholesterol, Total 157 100 - 199 mg/dL   Triglycerides 82 0 - 149 mg/dL   HDL 54 >39 mg/dL   VLDL Cholesterol Cal 16 5 - 40 mg/dL   LDL Calculated 87 0 - 99 mg/dL      Assessment & Plan:   Problem List Items Addressed This Visit      Unprioritized   Abnormal weight loss    Order chest x-ray.  Check labs.  Refer to GI for further evaluation      Relevant Orders   DG Chest 2 View   Ambulatory referral to Gastroenterology   CBC with Differential/Platelet   Comprehensive metabolic panel   TSH   GERD (gastroesophageal reflux  disease)    Feels something is stuck.  Change to Omeprazole and refer to GI for further evaluation  Relevant Medications   omeprazole (PRILOSEC) 20 MG capsule   Other Relevant Orders   Ambulatory referral to Gastroenterology   Hyperlipidemia    Check lipid panel      Hypertension    Not to goal but good numbers at home      Relevant Orders   Comprehensive metabolic panel   Lipid Panel w/o Chol/HDL Ratio   Hypothyroidism    Weight loss.  Check TSH      Relevant Orders   TSH    Other Visit Diagnoses    Annual physical exam    -  Primary   Relevant Orders   MM DIGITAL SCREENING BILATERAL       Follow up plan: Return in about 6 months (around 10/02/2017).

## 2017-04-04 NOTE — Assessment & Plan Note (Signed)
Feels something is stuck.  Change to Omeprazole and refer to GI for further evaluation

## 2017-04-04 NOTE — Assessment & Plan Note (Signed)
Check lipid panel  

## 2017-04-04 NOTE — Patient Instructions (Signed)
Please do call to schedule your mammogram; the number to schedule one at either [facility] or [facility] is [phone]   

## 2017-04-04 NOTE — Assessment & Plan Note (Addendum)
Not to goal but good numbers at home

## 2017-04-05 LAB — CBC WITH DIFFERENTIAL/PLATELET
BASOS: 1 %
Basophils Absolute: 0 10*3/uL (ref 0.0–0.2)
EOS (ABSOLUTE): 0 10*3/uL (ref 0.0–0.4)
Eos: 0 %
Hematocrit: 35.6 % (ref 34.0–46.6)
Hemoglobin: 11.7 g/dL (ref 11.1–15.9)
IMMATURE GRANS (ABS): 0 10*3/uL (ref 0.0–0.1)
IMMATURE GRANULOCYTES: 0 %
LYMPHS: 41 %
Lymphocytes Absolute: 2.4 10*3/uL (ref 0.7–3.1)
MCH: 30.7 pg (ref 26.6–33.0)
MCHC: 32.9 g/dL (ref 31.5–35.7)
MCV: 93 fL (ref 79–97)
MONOCYTES: 7 %
Monocytes Absolute: 0.4 10*3/uL (ref 0.1–0.9)
NEUTROS PCT: 51 %
Neutrophils Absolute: 3 10*3/uL (ref 1.4–7.0)
PLATELETS: 377 10*3/uL (ref 150–379)
RBC: 3.81 x10E6/uL (ref 3.77–5.28)
RDW: 13.6 % (ref 12.3–15.4)
WBC: 5.9 10*3/uL (ref 3.4–10.8)

## 2017-04-05 LAB — COMPREHENSIVE METABOLIC PANEL
ALT: 10 IU/L (ref 0–32)
AST: 18 IU/L (ref 0–40)
Albumin/Globulin Ratio: 1.7 (ref 1.2–2.2)
Albumin: 4.2 g/dL (ref 3.6–4.8)
Alkaline Phosphatase: 67 IU/L (ref 39–117)
BUN/Creatinine Ratio: 8 — ABNORMAL LOW (ref 12–28)
BUN: 5 mg/dL — AB (ref 8–27)
Bilirubin Total: 0.4 mg/dL (ref 0.0–1.2)
CALCIUM: 9.1 mg/dL (ref 8.7–10.3)
CO2: 26 mmol/L (ref 20–29)
Chloride: 102 mmol/L (ref 96–106)
Creatinine, Ser: 0.65 mg/dL (ref 0.57–1.00)
GFR, EST AFRICAN AMERICAN: 111 mL/min/{1.73_m2} (ref >59)
GFR, EST NON AFRICAN AMERICAN: 96 mL/min/{1.73_m2} (ref >59)
GLUCOSE: 98 mg/dL (ref 65–99)
Globulin, Total: 2.5 g/dL (ref 1.5–4.5)
POTASSIUM: 3.2 mmol/L — AB (ref 3.5–5.2)
Sodium: 145 mmol/L — ABNORMAL HIGH (ref 134–144)
TOTAL PROTEIN: 6.7 g/dL (ref 6.0–8.5)

## 2017-04-05 LAB — LIPID PANEL W/O CHOL/HDL RATIO
Cholesterol, Total: 172 mg/dL (ref 100–199)
HDL: 55 mg/dL (ref >39)
LDL Calculated: 100 mg/dL — ABNORMAL HIGH (ref 0–99)
TRIGLYCERIDES: 84 mg/dL (ref 0–149)
VLDL Cholesterol Cal: 17 mg/dL (ref 5–40)

## 2017-04-05 LAB — TSH: TSH: 1.78 u[IU]/mL (ref 0.450–4.500)

## 2017-04-08 ENCOUNTER — Encounter: Payer: Self-pay | Admitting: Unknown Physician Specialty

## 2017-04-09 ENCOUNTER — Encounter: Payer: Self-pay | Admitting: Gastroenterology

## 2017-05-06 ENCOUNTER — Ambulatory Visit
Admission: RE | Admit: 2017-05-06 | Discharge: 2017-05-06 | Disposition: A | Discharge status: Home / Self Care | Payer: Managed Care, Other (non HMO) | Source: Ambulatory Visit | Attending: Unknown Physician Specialty | Admitting: Unknown Physician Specialty

## 2017-05-06 DIAGNOSIS — Z1231 Encounter for screening mammogram for malignant neoplasm of breast: Secondary | ICD-10-CM | POA: Insufficient documentation

## 2017-05-06 DIAGNOSIS — Z0000 Encounter for general adult medical examination without abnormal findings: Secondary | ICD-10-CM

## 2017-05-07 ENCOUNTER — Encounter (INDEPENDENT_AMBULATORY_CARE_PROVIDER_SITE_OTHER): Payer: Self-pay

## 2017-05-07 ENCOUNTER — Encounter: Payer: Self-pay | Admitting: Gastroenterology

## 2017-05-07 ENCOUNTER — Ambulatory Visit (INDEPENDENT_AMBULATORY_CARE_PROVIDER_SITE_OTHER): Payer: Managed Care, Other (non HMO) | Admitting: Gastroenterology

## 2017-05-07 VITALS — BP 130/75 | HR 54 | Temp 97.8°F | Ht 59.5 in | Wt 136.2 lb

## 2017-05-07 DIAGNOSIS — Z86010 Personal history of colonic polyps: Secondary | ICD-10-CM

## 2017-05-07 DIAGNOSIS — R634 Abnormal weight loss: Secondary | ICD-10-CM | POA: Diagnosis not present

## 2017-05-07 DIAGNOSIS — R1310 Dysphagia, unspecified: Secondary | ICD-10-CM | POA: Diagnosis not present

## 2017-05-07 MED ORDER — POLYETHYLENE GLYCOL 3350 17 GM/SCOOP PO POWD: ORAL | 3 refills | Status: DC

## 2017-05-07 NOTE — Progress Notes (Signed)
[name] MD, MRCP(U.K) [address]  [place]  [place],  [address]  Main: [phone]  Fax: [phone]   Gastroenterology Consultation  Referring Provider:     Kathrine Haddock, NP Primary Care Physician:  Kathrine Haddock, NP Primary Gastroenterologist:  Dr. [name]  Reason for Consultation:     GERD        HPI:   Janet [name] is a 61 y.o. y/o female referred for consultation & management  by Dr. Kathrine Haddock, NP.    She has been referred for GERD. Last office note with provider states abnormal weight loss as well. Labs 03/2017- CBC,LFT's,TSH-normal.  She has had reflux she says since July 2018 . She describes her symptoms as she is having a hard time chewing as she had bad teeth, when she swallows she thinks the food gets stuck in her throat, she also has a burning sensation in her throat. She was commenced on Ranitidine and omeprazole- used up the whole bottle but didn't work. She says she took the meds before her meals. She has lost weight - unsure, clothes are loose. She has not had a colonoscopy in many years. She says she has had colon polyps when she had her first one 10 years, no family history of cancer or polyps of the colon. She is having bowel movements less offten , feels incomplete evacuation , no blood in her stool.   Past Medical History:  Diagnosis Date  . Hyperlipidemia   . Hypertension   . Menopausal state   . Thyroid disease     Past Surgical History:  Procedure Laterality Date  . ABDOMINAL HYSTERECTOMY  2005  . BREAST EXCISIONAL BIOPSY Right 1979  . CYSTECTOMY Right    breast  . CYSTECTOMY  02/2015   back of neck  . TUBAL LIGATION  1986    Prior to Admission medications   Medication Sig Start Date End Date Taking? Authorizing Provider  amLODipine (NORVASC) 5 MG tablet Take 1 tablet (5 mg total) by mouth daily. 12/10/16   Kathrine Haddock, NP  atorvastatin (LIPITOR) 10 MG tablet Take 1 tablet (10 mg total) by mouth daily at 6 PM.  12/10/16   Kathrine Haddock, NP  cyclobenzaprine (FLEXERIL) 10 MG tablet Take 1 tablet (10 mg total) by mouth 3 (three) times daily as needed for muscle spasms. Patient not taking: Reported on 04/04/2017 10/07/16   Kathrine Haddock, NP  levothyroxine (SYNTHROID, LEVOTHROID) 50 MCG tablet Take 1 tablet (50 mcg total) by mouth daily. 12/10/16   Kathrine Haddock, NP  lisinopril (PRINIVIL,ZESTRIL) 20 MG tablet Take 1 tablet (20 mg total) by mouth daily. 12/10/16   Kathrine Haddock, NP  omeprazole (PRILOSEC) 20 MG capsule Take 1 capsule (20 mg total) by mouth daily. 04/04/17   Kathrine Haddock, NP  ranitidine (ZANTAC) 150 MG tablet TK 1 T PO BID 03/01/17   [provider]    Family History  Problem Relation Age of Onset  . Cancer Mother        leukemia  . Hypertension Mother   . Stroke Mother   . Stroke Father   . Pneumonia Father   . Diabetes Sister   . Hyperlipidemia Sister   . Breast cancer Maternal Aunt 70     Social History  Substance Use Topics  . Smoking status: Former Smoker    Packs/day: 0.25    Types: Cigarettes    Quit date: 02/01/2017  . Smokeless tobacco: Never Used  . Alcohol use No  Allergies as of 05/07/2017  . (No Known Allergies)    Review of Systems:    All systems reviewed and negative except where noted in HPI.   Physical Exam:  LMP 08/08/2003 (Approximate)  Patient's last menstrual period was 08/08/2003 (approximate). Psych:  Alert and cooperative. Normal mood and affect. General:   Alert,  Well-developed, well-nourished, pleasant and cooperative in NAD Head:  Normocephalic and atraumatic. Eyes:  Sclera clear, no icterus.   Conjunctiva pink. Ears:  Normal auditory acuity. Nose:  No deformity, discharge, or lesions. Mouth:  No deformity or lesions,oropharynx pink & moist. Neck:  Supple; no masses or thyromegaly. Lungs:  Respirations even and unlabored.  Clear throughout to auscultation.   No wheezes, crackles, or rhonchi. No acute distress. Heart:  Regular rate  and rhythm; no murmurs, clicks, rubs, or gallops. Abdomen:  Normal bowel sounds.  No bruits.  Soft, non-tender and non-distended without masses, hepatosplenomegaly or hernias noted.  No guarding or rebound tenderness.    Neurologic:  Alert and oriented x3;  grossly normal neurologically. Skin:  Intact without significant lesions or rashes. No jaundice. Lymph Nodes:  No significant cervical adenopathy. Psych:  Alert and cooperative. Normal mood and affect.  Imaging Studies: Mm Digital Screening Bilateral  Result Date: 05/06/2017 CLINICAL DATA:  Screening. EXAM: DIGITAL SCREENING BILATERAL MAMMOGRAM WITH CAD COMPARISON:  Previous exam(s). ACR Breast Density Category b: There are scattered areas of fibroglandular density. FINDINGS: There are no findings suspicious for malignancy. Images were processed with CAD. IMPRESSION: No mammographic evidence of malignancy. A result letter of this screening mammogram will be mailed directly to the patient. RECOMMENDATION: Screening mammogram in one year. (Code:SM-B-01Y) BI-RADS CATEGORY  1: Negative. Electronically Signed   By: Evangeline Dakin M.D.   On: 05/06/2017 12:40    Assessment and Plan:   Janet [name] is a 61 y.o. y/o female has been referred for GERD. Her history is suggestive of weight loss , diarrhea, new onset constipation.   Plan  1. EGD+colonoscopy to evaluate for dysphagia and change in bowel movements  2. Miralax once a day everyday  3. High fiber diet -print out provided  4. Quit smoking in June and doing well  5. If these tests are negative and she continues to lose weight at next visit will then need CT chest/abdomen/pelvis to r/o malignancy    I have discussed alternative options, risks & benefits,  which include, but are not limited to, bleeding, infection, perforation,respiratory complication & drug reaction.  The patient agrees with this plan & written consent will be obtained.     Follow up in 6-8 weeks   Dr [name]  MD,MRCP(U.K)

## 2017-05-07 NOTE — Addendum Note (Signed)
Addended by: Leontine Locket Z on: 05/07/2017 11:00 AM   Modules accepted: Orders, SmartSet

## 2017-05-08 ENCOUNTER — Other Ambulatory Visit: Payer: Self-pay | Admitting: Unknown Physician Specialty

## 2017-05-13 ENCOUNTER — Encounter
Admission: RE | Disposition: A | Discharge status: Home / Self Care | Payer: Self-pay | Source: Ambulatory Visit | Attending: Gastroenterology

## 2017-05-13 ENCOUNTER — Ambulatory Visit: Payer: Managed Care, Other (non HMO) | Admitting: Certified Registered"

## 2017-05-13 ENCOUNTER — Other Ambulatory Visit: Payer: Self-pay | Admitting: Unknown Physician Specialty

## 2017-05-13 ENCOUNTER — Ambulatory Visit
Admission: RE | Admit: 2017-05-13 | Discharge: 2017-05-13 | Disposition: A | Discharge status: Home / Self Care | Payer: Managed Care, Other (non HMO) | Source: Ambulatory Visit | Attending: Gastroenterology | Admitting: Gastroenterology

## 2017-05-13 ENCOUNTER — Encounter: Payer: Self-pay | Admitting: *Deleted

## 2017-05-13 DIAGNOSIS — D122 Benign neoplasm of ascending colon: Secondary | ICD-10-CM | POA: Diagnosis not present

## 2017-05-13 DIAGNOSIS — I10 Essential (primary) hypertension: Secondary | ICD-10-CM | POA: Insufficient documentation

## 2017-05-13 DIAGNOSIS — E785 Hyperlipidemia, unspecified: Secondary | ICD-10-CM | POA: Diagnosis not present

## 2017-05-13 DIAGNOSIS — R1310 Dysphagia, unspecified: Secondary | ICD-10-CM | POA: Diagnosis not present

## 2017-05-13 DIAGNOSIS — E039 Hypothyroidism, unspecified: Secondary | ICD-10-CM | POA: Insufficient documentation

## 2017-05-13 DIAGNOSIS — R194 Change in bowel habit: Secondary | ICD-10-CM | POA: Diagnosis not present

## 2017-05-13 DIAGNOSIS — Z79899 Other long term (current) drug therapy: Secondary | ICD-10-CM | POA: Diagnosis not present

## 2017-05-13 DIAGNOSIS — K6389 Other specified diseases of intestine: Secondary | ICD-10-CM | POA: Diagnosis not present

## 2017-05-13 DIAGNOSIS — Z87891 Personal history of nicotine dependence: Secondary | ICD-10-CM | POA: Diagnosis not present

## 2017-05-13 DIAGNOSIS — K621 Rectal polyp: Secondary | ICD-10-CM | POA: Insufficient documentation

## 2017-05-13 DIAGNOSIS — K219 Gastro-esophageal reflux disease without esophagitis: Secondary | ICD-10-CM | POA: Diagnosis not present

## 2017-05-13 DIAGNOSIS — Z86010 Personal history of colonic polyps: Secondary | ICD-10-CM

## 2017-05-13 DIAGNOSIS — R634 Abnormal weight loss: Secondary | ICD-10-CM

## 2017-05-13 HISTORY — PX: ESOPHAGOGASTRODUODENOSCOPY (EGD) WITH PROPOFOL: SHX5813

## 2017-05-13 HISTORY — PX: COLONOSCOPY WITH PROPOFOL: SHX5780

## 2017-05-13 HISTORY — DX: Hypothyroidism, unspecified: E03.9

## 2017-05-13 SURGERY — COLONOSCOPY WITH PROPOFOL
Anesthesia: General

## 2017-05-13 MED ORDER — POLYETHYLENE GLYCOL 3350 17 G PO PACK: 17.0000 g | PACK | Freq: Every day | ORAL | 0 refills | Status: DC

## 2017-05-13 MED ORDER — LIDOCAINE HCL (CARDIAC) 20 MG/ML IV SOLN
INTRAVENOUS | Status: DC | PRN
  Administered 2017-05-13: 50 mg via INTRAVENOUS

## 2017-05-13 MED ORDER — PROPOFOL 500 MG/50ML IV EMUL
INTRAVENOUS | Status: AC
  Filled 2017-05-13: qty 50

## 2017-05-13 MED ORDER — PROPOFOL 10 MG/ML IV BOLUS
INTRAVENOUS | Status: DC | PRN
  Administered 2017-05-13: 60 mg via INTRAVENOUS

## 2017-05-13 MED ORDER — PHENYLEPHRINE HCL 10 MG/ML IJ SOLN
INTRAMUSCULAR | Status: AC
  Filled 2017-05-13: qty 1

## 2017-05-13 MED ORDER — MIDAZOLAM HCL 2 MG/2ML IJ SOLN
INTRAMUSCULAR | Status: AC
  Filled 2017-05-13: qty 2

## 2017-05-13 MED ORDER — OMEPRAZOLE 40 MG PO CPDR: 40.0000 mg | DELAYED_RELEASE_CAPSULE | Freq: Every day | ORAL | 1 refills | Status: DC

## 2017-05-13 MED ORDER — LIDOCAINE HCL (PF) 2 % IJ SOLN
INTRAMUSCULAR | Status: AC
  Filled 2017-05-13: qty 10

## 2017-05-13 MED ORDER — PROPOFOL 500 MG/50ML IV EMUL
INTRAVENOUS | Status: DC | PRN
  Administered 2017-05-13: 150 ug/kg/min via INTRAVENOUS

## 2017-05-13 MED ORDER — SODIUM CHLORIDE 0.9 % IV SOLN
INTRAVENOUS | Status: DC
  Administered 2017-05-13: 1000 mL via INTRAVENOUS

## 2017-05-13 MED ORDER — MIDAZOLAM HCL 2 MG/2ML IJ SOLN
INTRAMUSCULAR | Status: DC | PRN
  Administered 2017-05-13: 2 mg via INTRAVENOUS

## 2017-05-13 MED ORDER — PHENYLEPHRINE HCL 10 MG/ML IJ SOLN
INTRAMUSCULAR | Status: DC | PRN
  Administered 2017-05-13: 100 ug via INTRAVENOUS

## 2017-05-13 NOTE — Op Note (Signed)
[facility] Gastroenterology Patient Name: Janet [name] Procedure Date: 05/13/2017 8:20 AM MRN: [id number] Account #: [id number] Date of Birth: 02/09/1956 Admit Type: Outpatient Age: 61 Room: [facility] ENDO ROOM 1 Gender: Female Note Status: Finalized Procedure:            Upper GI endoscopy Indications:          Dysphagia Providers:            [name] MD, MD Referring MD:         Kathrine Haddock (Referring MD) Medicines:            Monitored Anesthesia Care Complications:        No immediate complications. Procedure:            Pre-Anesthesia Assessment:                       - Prior to the procedure, a History and Physical was                        performed, and patient medications, allergies and                        sensitivities were reviewed. The patient's tolerance of                        previous anesthesia was reviewed.                       - The risks and benefits of the procedure and the                        sedation options and risks were discussed with the                        patient. All questions were answered and informed                        consent was obtained.                       - ASA Grade Assessment: III - A patient with severe                        systemic disease.                       After obtaining informed consent, the endoscope was                        passed under direct vision. Throughout the procedure,                        the patient's blood pressure, pulse, and oxygen                        saturations were monitored continuously. The Endoscope                        was introduced through the mouth, and advanced to the  third part of duodenum. The upper GI endoscopy was                        accomplished with ease. The patient tolerated the                        procedure well. Findings:      The examined duodenum was normal.      The stomach was normal.      The examined esophagus  was normal. This was biopsied with a cold forceps       for evaluation of eosinophilic esophagitis.      The exam was otherwise without abnormality. Impression:           - Normal examined duodenum.                       - Normal stomach.                       - Normal esophagus. Biopsied.                       - The examination was otherwise normal. Recommendation:       - Discharge patient to home (with escort).                       - Resume previous diet.                       - Continue present medications.                       - Await pathology results.                       - Use Prilosec OTC 20 mg PO daily for 6 weeks.                       - Return to my office in 6 weeks. Procedure Code(s):    --- Professional ---                       [phone], Esophagogastroduodenoscopy, flexible, transoral;                        with biopsy, single or multiple Diagnosis Code(s):    --- Professional ---                       R13.10, Dysphagia, unspecified CPT copyright 2016 American Medical Association. All rights reserved. The codes documented in this report are preliminary and upon coder review may  be revised to meet current compliance requirements. [name], MD [name] MD, MD 05/13/2017 8:29:47 AM This report has been signed electronically. Number of Addenda: 0 Note Initiated On: 05/13/2017 8:20 AM      [facility]

## 2017-05-13 NOTE — Anesthesia Post-op Follow-up Note (Signed)
Anesthesia QCDR form completed.        

## 2017-05-13 NOTE — Transfer of Care (Signed)
Immediate Anesthesia Transfer of Care Note  Patient: Janet [name]  Procedure(s) Performed: COLONOSCOPY WITH PROPOFOL (N/A ) ESOPHAGOGASTRODUODENOSCOPY (EGD) WITH PROPOFOL (N/A )  Patient Location: PACU  Anesthesia Type:General  Level of Consciousness: sedated  Airway & Oxygen Therapy: Patient Spontanous Breathing and Patient connected to nasal cannula oxygen  Post-op Assessment: Report given to RN and Post -op Vital signs reviewed and stable  Post vital signs: Reviewed and stable  Last Vitals:  Vitals:   05/13/17 0858 05/13/17 0902  BP: (!) 87/57 106/64  Pulse: 64 (!) 55  Resp: 19 19  Temp: (!) 35.7 C   SpO2: 100% 100%    Last Pain:  Vitals:   05/13/17 0858  TempSrc: Tympanic         Complications: No apparent anesthesia complications

## 2017-05-13 NOTE — H&P (Signed)
[name] MD [address] [place], [place] [address] Phone: [phone] Fax : [phone]  Primary Care Physician:  Kathrine Haddock, NP Primary Gastroenterologist:  Dr. [name]   Pre-Procedure History & Physical: HPI:  Janet [name] is a 61 y.o. female is here for an endoscopy and colonoscopy.   Past Medical History:  Diagnosis Date  . Hyperlipidemia   . Hypertension   . Hypothyroidism   . Menopausal state   . Thyroid disease     Past Surgical History:  Procedure Laterality Date  . ABDOMINAL HYSTERECTOMY  2005  . BREAST EXCISIONAL BIOPSY Right 1979  . CYSTECTOMY Right    breast  . CYSTECTOMY  02/2015   back of neck  . TUBAL LIGATION  1986    Prior to Admission medications   Medication Sig Start Date End Date Taking? Authorizing Provider  amLODipine (NORVASC) 5 MG tablet TAKE 1 TABLET DAILY 05/08/17   Kathrine Haddock, NP  atorvastatin (LIPITOR) 10 MG tablet Take 1 tablet (10 mg total) by mouth daily at 6 PM. 12/10/16   Kathrine Haddock, NP  cyclobenzaprine (FLEXERIL) 10 MG tablet Take 1 tablet (10 mg total) by mouth 3 (three) times daily as needed for muscle spasms. Patient not taking: Reported on 05/07/2017 10/07/16   Kathrine Haddock, NP  levothyroxine (SYNTHROID, LEVOTHROID) 50 MCG tablet Take 1 tablet (50 mcg total) by mouth daily. 12/10/16   Kathrine Haddock, NP  lisinopril (PRINIVIL,ZESTRIL) 20 MG tablet TAKE 1 TABLET DAILY 05/08/17   Kathrine Haddock, NP  polyethylene glycol powder (GLYCOLAX/MIRALAX) powder 17 grams daily 05/07/17   [name], MD    Allergies as of 05/07/2017  . (No Known Allergies)    Family History  Problem Relation Age of Onset  . Cancer Mother        leukemia  . Hypertension Mother   . Stroke Mother   . Stroke Father   . Pneumonia Father   . Diabetes Sister   . Hyperlipidemia Sister   . Breast cancer Maternal Aunt 70    Social History   Social History  . Marital status: Married    Spouse name: N/A  . Number of children: N/A   . Years of education: N/A   Occupational History  . Not on file.   Social History Main Topics  . Smoking status: Former Smoker    Packs/day: 0.25    Types: Cigarettes    Quit date: 02/01/2017  . Smokeless tobacco: Never Used  . Alcohol use No  . Drug use: No  . Sexual activity: Not Currently   Other Topics Concern  . Not on file   Social History Narrative  . No narrative on file    Review of Systems: See HPI, otherwise negative ROS  Physical Exam: BP 139/69   Pulse 70   Temp (!) 97.2 F (36.2 C) (Tympanic)   Resp 16   Ht 5' (1.524 m)   Wt 136 lb (61.7 kg)   LMP 08/08/2003 (Approximate)   SpO2 100%   BMI 26.56 kg/m  General:   Alert,  pleasant and cooperative in NAD Head:  Normocephalic and atraumatic. Neck:  Supple; no masses or thyromegaly. Lungs:  Clear throughout to auscultation.    Heart:  Regular rate and rhythm. Abdomen:  Soft, nontender and nondistended. Normal bowel sounds, without guarding, and without rebound.   Neurologic:  Alert and  oriented x4;  grossly normal neurologically.  Impression/Plan: Janet [name] is here for an endoscopy and colonoscopy to be  performed for dysphagia and change in bowel habits.   Risks, benefits, limitations, and alternatives regarding  endoscopy and colonoscopy have been reviewed with the patient.  Questions have been answered.  All parties agreeable.   [name], MD  05/13/2017, 8:17 AM

## 2017-05-13 NOTE — Anesthesia Procedure Notes (Signed)
Performed by: Alphonza Tramell Pre-anesthesia Checklist: Patient identified, Emergency Drugs available, Suction available, Patient being monitored and Timeout performed Patient Re-evaluated:Patient Re-evaluated prior to induction Oxygen Delivery Method: Nasal cannula Induction Type: IV induction       

## 2017-05-13 NOTE — Op Note (Signed)
[facility] Gastroenterology Patient Name: Janet [name] Procedure Date: 05/13/2017 7:48 AM MRN: [id number] Account #: [id number] Date of Birth: 06-10-1956 Admit Type: Outpatient Age: 61 Room: [facility] ENDO ROOM 1 Gender: Female Note Status: Finalized Procedure:            Colonoscopy Indications:          Change in bowel habits Providers:            [name] MD, MD Referring MD:         Kathrine Haddock (Referring MD) Medicines:            Monitored Anesthesia Care Complications:        No immediate complications. Procedure:            Pre-Anesthesia Assessment:                       - Prior to the procedure, a History and Physical was                        performed, and patient medications, allergies and                        sensitivities were reviewed. The patient's tolerance of                        previous anesthesia was reviewed.                       - The risks and benefits of the procedure and the                        sedation options and risks were discussed with the                        patient. All questions were answered and informed                        consent was obtained.                       - ASA Grade Assessment: III - A patient with severe                        systemic disease.                       After obtaining informed consent, the colonoscope was                        passed under direct vision. Throughout the procedure,                        the patient's blood pressure, pulse, and oxygen                        saturations were monitored continuously. The                        Colonoscope was introduced through the anus and                        advanced  to the the cecum, identified by the                        appendiceal orifice, IC valve and transillumination.                        The colonoscopy was performed with ease. The patient                        tolerated the procedure well. The quality of the bowel                      preparation was good. Findings:      Four sessile polyps were found in the rectum. The polyps were 3 to 4 mm       in size. These polyps were removed with a cold biopsy forceps. Resection       and retrieval were complete.      A 8 mm polyp was found in the sigmoid colon. The polyp was sessile. The       polyp was removed with a cold snare. Resection and retrieval were       complete.      Two sessile polyps were found in the ascending colon. The polyps were 3       to 4 mm in size. These polyps were removed with a cold biopsy forceps.       Resection and retrieval were complete.      Two sessile polyps were found in the ascending colon. The polyps were 5       to 6 mm in size. These polyps were removed with a cold snare. Resection       and retrieval were complete.      The exam was otherwise without abnormality on direct and retroflexion       views. Impression:           - Four 3 to 4 mm polyps in the rectum, removed with a                        cold biopsy forceps. Resected and retrieved.                       - One 8 mm polyp in the sigmoid colon, removed with a                        cold snare. Resected and retrieved.                       - Two 3 to 4 mm polyps in the ascending colon, removed                        with a cold biopsy forceps. Resected and retrieved.                       - Two 5 to 6 mm polyps in the ascending colon, removed                        with a cold snare. Resected and retrieved.                       -  The examination was otherwise normal on direct and                        retroflexion views. Recommendation:       - Discharge patient to home (with escort).                       - Resume previous diet.                       - Continue present medications.                       - Await pathology results.                       - Repeat colonoscopy in 3 - 5 years for surveillance                        based on pathology results.                        - Miralax 1 capful (17 grams) in 8 ounces of water PO                        daily for 6 weeks.                       - Return to my office in 6 weeks. Procedure Code(s):    --- Professional ---                       [phone], Colonoscopy, flexible; with removal of tumor(s),                        polyp(s), or other lesion(s) by snare technique                       45380, 74, Colonoscopy, flexible; with biopsy, single                        or multiple Diagnosis Code(s):    --- Professional ---                       K62.1, Rectal polyp                       D12.2, Benign neoplasm of ascending colon                       D12.5, Benign neoplasm of sigmoid colon                       R19.4, Change in bowel habit CPT copyright 2016 American Medical Association. All rights reserved. The codes documented in this report are preliminary and upon coder review may  be revised to meet current compliance requirements. [name], MD [name] MD, MD 05/13/2017 8:58:33 AM This report has been signed electronically. Number of Addenda: 0 Note Initiated On: 05/13/2017 7:48 AM Scope Withdrawal Time: 0 hours 19 minutes 23 seconds  Total Procedure Duration: 0 hours 23 minutes 16 seconds       [facility]

## 2017-05-13 NOTE — Anesthesia Preprocedure Evaluation (Signed)
Anesthesia Evaluation  Patient identified by MRN, date of birth, ID band Patient awake    Reviewed: Allergy & Precautions, H&P , NPO status , Patient's Chart, lab work & pertinent test results, reviewed documented beta blocker date and time   Airway Mallampati: II   Neck ROM: full    Dental  (+) Poor Dentition   Pulmonary neg pulmonary ROS, former smoker,    Pulmonary exam normal        Cardiovascular hypertension, negative cardio ROS Normal cardiovascular exam Rhythm:regular Rate:Normal     Neuro/Psych negative neurological ROS  negative psych ROS   GI/Hepatic negative GI ROS, Neg liver ROS, GERD  Medicated,  Endo/Other  negative endocrine ROSHypothyroidism   Renal/GU negative Renal ROS  negative genitourinary   Musculoskeletal   Abdominal   Peds  Hematology negative hematology ROS (+)   Anesthesia Other Findings Past Medical History: No date: Hyperlipidemia No date: Hypertension No date: Hypothyroidism No date: Menopausal state No date: Thyroid disease Past Surgical History: 2005: ABDOMINAL HYSTERECTOMY 1979: BREAST EXCISIONAL BIOPSY; Right No date: CYSTECTOMY; Right     Comment:  breast 02/2015: CYSTECTOMY     Comment:  back of neck 1986: TUBAL LIGATION BMI    Body Mass Index:  26.56 kg/m     Reproductive/Obstetrics negative OB ROS                             Anesthesia Physical Anesthesia Plan  ASA: III  Anesthesia Plan: General   Post-op Pain Management:    Induction:   PONV Risk Score and Plan: 3 and Ondansetron, Dexamethasone, Midazolam and Propofol infusion  Airway Management Planned:   Additional Equipment:   Intra-op Plan:   Post-operative Plan:   Informed Consent: I have reviewed the patients History and Physical, chart, labs and discussed the procedure including the risks, benefits and alternatives for the proposed anesthesia with the patient or  authorized representative who has indicated his/her understanding and acceptance.   Dental Advisory Given  Plan Discussed with: CRNA  Anesthesia Plan Comments:         Anesthesia Quick Evaluation

## 2017-05-14 ENCOUNTER — Encounter: Payer: Self-pay | Admitting: Gastroenterology

## 2017-05-14 LAB — SURGICAL PATHOLOGY

## 2017-05-14 NOTE — Anesthesia Postprocedure Evaluation (Signed)
Anesthesia Post Note  Patient: Janet [name]  Procedure(s) Performed: COLONOSCOPY WITH PROPOFOL (N/A ) ESOPHAGOGASTRODUODENOSCOPY (EGD) WITH PROPOFOL (N/A )  Patient location during evaluation: PACU Anesthesia Type: General Level of consciousness: awake and alert Pain management: pain level controlled Vital Signs Assessment: post-procedure vital signs reviewed and stable Respiratory status: spontaneous breathing, nonlabored ventilation, respiratory function stable and patient connected to nasal cannula oxygen Cardiovascular status: blood pressure returned to baseline and stable Postop Assessment: no apparent nausea or vomiting Anesthetic complications: no     Last Vitals:  Vitals:   05/13/17 0918 05/13/17 0928  BP: 108/68   Pulse: 62 (!) 58  Resp: 12 12  Temp:    SpO2: 100% 100%    Last Pain:  Vitals:   05/13/17 0858  TempSrc: Tympanic                 Molli Barrows

## 2017-05-16 ENCOUNTER — Telehealth: Payer: Self-pay | Admitting: Gastroenterology

## 2017-05-16 NOTE — Telephone Encounter (Signed)
LVM for patient to call and schedule a 6 week follow up appt with Dr. Vicente Males.

## 2017-05-19 ENCOUNTER — Encounter: Payer: Self-pay | Admitting: Gastroenterology

## 2017-05-19 ENCOUNTER — Telehealth: Payer: Self-pay | Admitting: Gastroenterology

## 2017-05-19 NOTE — Telephone Encounter (Signed)
Left voice message for patient to call and schedule a 6 week follow up with Dr. Vicente Males. Letter sent

## 2017-05-28 ENCOUNTER — Telehealth: Payer: Self-pay | Admitting: Gastroenterology

## 2017-05-28 NOTE — Telephone Encounter (Signed)
Patient left a voice message that she is returning your call °

## 2017-05-29 ENCOUNTER — Telehealth: Payer: Self-pay | Admitting: Gastroenterology

## 2017-05-29 NOTE — Telephone Encounter (Signed)
Continue taking Prilosec if experiencing symptoms

## 2017-05-29 NOTE — Telephone Encounter (Signed)
Patient wants to know if she needs to continue taking the prilosec, please call patient.

## 2017-06-17 ENCOUNTER — Encounter: Payer: Self-pay | Admitting: Gastroenterology

## 2017-07-08 ENCOUNTER — Telehealth: Payer: Self-pay | Admitting: Unknown Physician Specialty

## 2017-07-08 NOTE — Telephone Encounter (Signed)
Patient stopped by office with a couple scripts she needs cheryl to look at to see if ok for her to take these. She needs to know ASAP as she needs to get these filled.  These were prescribed by [facility]  Naproxen and methocarbamol   I made a copy of the script for [facility] to look at.  She also wanted to let Malachy Mood know she never had the xray done that she gave her at the last visit.  Thanks I put the copy of the script in [facility]

## 2017-07-08 NOTE — Telephone Encounter (Signed)
Routing to provider. Prescriptions are for naproxen 500 mg- 1 tablet BID for 15 days and methocarbamol 500 mg- 1 tablet by BID for 10 days. ED note in care everywhere in chart.

## 2017-07-08 NOTE — Telephone Encounter (Signed)
Called and left patient a VM (signed DPR) letting her know what Malachy Mood said about medications. I also let the patient know that Malachy Mood wants her to come in to be seen if she is still loosing weight. Asked for patient to call to schedule an appointment if she is.

## 2017-07-08 NOTE — Telephone Encounter (Signed)
Thanks, those are fine.  Is she still losing weight?  If she is, I would like to see her back soon

## 2017-07-09 ENCOUNTER — Ambulatory Visit (INDEPENDENT_AMBULATORY_CARE_PROVIDER_SITE_OTHER): Payer: Managed Care, Other (non HMO) | Admitting: Unknown Physician Specialty

## 2017-07-09 ENCOUNTER — Encounter: Payer: Self-pay | Admitting: Unknown Physician Specialty

## 2017-07-09 DIAGNOSIS — IMO0001 Reserved for inherently not codable concepts without codable children: Secondary | ICD-10-CM

## 2017-07-09 DIAGNOSIS — R634 Abnormal weight loss: Secondary | ICD-10-CM

## 2017-07-09 DIAGNOSIS — R911 Solitary pulmonary nodule: Secondary | ICD-10-CM

## 2017-07-09 DIAGNOSIS — C3411 Malignant neoplasm of upper lobe, right bronchus or lung: Secondary | ICD-10-CM | POA: Insufficient documentation

## 2017-07-09 NOTE — Progress Notes (Signed)
BP 120/69   Pulse 60   Temp 98.5 F (36.9 C) (Oral)   Wt 119 lb 12.8 oz (54.3 kg)   LMP 08/08/2003 (Approximate)   SpO2 98%   BMI 23.40 kg/m    Subjective:    Patient ID: Janet [name], female    DOB: 06-11-1956, 61 y.o.   MRN: [id number]  HPI: Janet [name] is a 61 y.o. female  Chief Complaint  Patient presents with  . Hospitalization Follow-up  . Weight Loss    Weight loss Pt states she has had weight loss.  She has lost 6 pounds in the last 6 months. States she doesn't eat well.  States nothing smells or tastes the same.  I've reviewed her history and notes she had a CT of of her neck with contrast through ENT.  Chest CT was recommended for a upper lobe nodule.  This seems lost to f/u.  When she swallows something gets stuck.  No SOB or chest pain.  No fever.  Smoked for most of her life.  I ordered a chest x-ray but didn't know where to go  Shoulder pain Presented to the ER earlier in the week.  Points to upper back right side and states she is having a spasm.  NSAIDs and muscle relaxant given.      Depression screen [facility] 2/9 07/09/2017 04/04/2017 04/02/2016  Decreased Interest 0 3 0  Down, Depressed, Hopeless 0 1 0  PHQ - 2 Score 0 4 0  Altered sleeping 1 1 -  Tired, decreased energy 1 2 -  Change in appetite 2 2 -  Feeling bad or failure about yourself  1 2 -  Trouble concentrating 0 0 -  Moving slowly or fidgety/restless 0 0 -  Suicidal thoughts 0 0 -  PHQ-9 Score 5 11 -     Relevant past medical, surgical, family and social history reviewed and updated as indicated. Interim medical history since our last visit reviewed. Allergies and medications reviewed and updated.  Review of Systems  Constitutional: Positive for fatigue and unexpected weight change.  HENT: Negative.   Eyes: Negative.   Respiratory: Negative.   Cardiovascular: Negative.   Gastrointestinal:       Dysphagia  Genitourinary: Negative.   Skin: Negative.   Neurological: Negative.      Per HPI unless specifically indicated above     Objective:    BP 120/69   Pulse 60   Temp 98.5 F (36.9 C) (Oral)   Wt 119 lb 12.8 oz (54.3 kg)   LMP 08/08/2003 (Approximate)   SpO2 98%   BMI 23.40 kg/m   Wt Readings from Last 3 Encounters:  07/09/17 119 lb 12.8 oz (54.3 kg)  05/13/17 136 lb (61.7 kg)  05/07/17 136 lb 3.2 oz (61.8 kg)    Physical Exam  Constitutional: She is oriented to person, place, and time. She appears well-developed and well-nourished. No distress.  HENT:  Head: Normocephalic and atraumatic.  Eyes: Conjunctivae and lids are normal. Right eye exhibits no discharge. Left eye exhibits no discharge. No scleral icterus.  Neck: Normal range of motion. Neck supple. No JVD present. Carotid bruit is not present.  Cardiovascular: Normal rate, regular rhythm and normal heart sounds.  Pulmonary/Chest: Effort normal. She has decreased breath sounds.  Abdominal: Normal appearance. There is no splenomegaly or hepatomegaly.  Musculoskeletal: Normal range of motion.  Neurological: She is alert and oriented to person, place, and time.  Skin: Skin is warm, dry and intact.  No rash noted. No pallor.  Psychiatric: She has a normal mood and affect. Her behavior is normal. Judgment and thought content normal.     Assessment & Plan:   Problem List Items Addressed This Visit      Unprioritized   Abnormal weight loss    Pt with significant non-purposeful weight loss.  Will get labs and f/u on a pulmonary nodule through chest x-ray and chest CT      Relevant Orders   CBC with Differential/Platelet   Comprehensive metabolic panel   TSH   Lung nodule < 6cm on CT    Sub solid Lung nodule 12 by 24 mm noted right upper lung on CT dated from 2016 with ENT while evaluating head neck.  No f/u noted.  Order CT      Relevant Orders   CT Chest Wo Contrast      Discussed pt with Dr. Wynetta Emery  Follow up plan: Return in about 4 weeks (around 08/06/2017).

## 2017-07-09 NOTE — Assessment & Plan Note (Signed)
Pt with significant non-purposeful weight loss.  Will get labs and f/u on a pulmonary nodule through chest x-ray and chest CT

## 2017-07-09 NOTE — Assessment & Plan Note (Addendum)
Sub solid Lung nodule 12 by 24 mm noted right upper lung on CT dated from 2016 with ENT while evaluating head neck.  No f/u noted.  Order CT

## 2017-07-10 ENCOUNTER — Ambulatory Visit (INDEPENDENT_AMBULATORY_CARE_PROVIDER_SITE_OTHER): Payer: Managed Care, Other (non HMO) | Admitting: Gastroenterology

## 2017-07-10 ENCOUNTER — Encounter: Payer: Self-pay | Admitting: Gastroenterology

## 2017-07-10 ENCOUNTER — Ambulatory Visit
Admission: RE | Admit: 2017-07-10 | Discharge: 2017-07-10 | Disposition: A | Discharge status: Home / Self Care | Payer: Managed Care, Other (non HMO) | Source: Ambulatory Visit | Attending: Unknown Physician Specialty | Admitting: Unknown Physician Specialty

## 2017-07-10 VITALS — BP 126/76 | HR 52 | Temp 97.9°F | Ht 59.5 in | Wt 120.8 lb

## 2017-07-10 DIAGNOSIS — Z87891 Personal history of nicotine dependence: Secondary | ICD-10-CM | POA: Diagnosis not present

## 2017-07-10 DIAGNOSIS — R634 Abnormal weight loss: Secondary | ICD-10-CM | POA: Insufficient documentation

## 2017-07-10 DIAGNOSIS — R918 Other nonspecific abnormal finding of lung field: Secondary | ICD-10-CM | POA: Diagnosis not present

## 2017-07-10 DIAGNOSIS — I700 Atherosclerosis of aorta: Secondary | ICD-10-CM | POA: Insufficient documentation

## 2017-07-10 LAB — COMPREHENSIVE METABOLIC PANEL
ALBUMIN: 4.3 g/dL (ref 3.6–4.8)
ALT: 6 IU/L (ref 0–32)
AST: 13 IU/L (ref 0–40)
Albumin/Globulin Ratio: 1.9 (ref 1.2–2.2)
Alkaline Phosphatase: 59 IU/L (ref 39–117)
BUN / CREAT RATIO: 11 — AB (ref 12–28)
BUN: 8 mg/dL (ref 8–27)
Bilirubin Total: <0.2 mg/dL (ref 0.0–1.2)
CALCIUM: 9.6 mg/dL (ref 8.7–10.3)
CO2: 29 mmol/L (ref 20–29)
CREATININE: 0.71 mg/dL (ref 0.57–1.00)
Chloride: 103 mmol/L (ref 96–106)
GFR, EST AFRICAN AMERICAN: 106 mL/min/{1.73_m2} (ref >59)
GFR, EST NON AFRICAN AMERICAN: 92 mL/min/{1.73_m2} (ref >59)
GLUCOSE: 93 mg/dL (ref 65–99)
Globulin, Total: 2.3 g/dL (ref 1.5–4.5)
Potassium: 4.3 mmol/L (ref 3.5–5.2)
Sodium: 147 mmol/L — ABNORMAL HIGH (ref 134–144)
TOTAL PROTEIN: 6.6 g/dL (ref 6.0–8.5)

## 2017-07-10 LAB — CBC WITH DIFFERENTIAL/PLATELET
BASOS ABS: 0 10*3/uL (ref 0.0–0.2)
Basos: 1 %
EOS (ABSOLUTE): 0.1 10*3/uL (ref 0.0–0.4)
EOS: 1 %
HEMOGLOBIN: 10.8 g/dL — AB (ref 11.1–15.9)
Hematocrit: 32.7 % — ABNORMAL LOW (ref 34.0–46.6)
IMMATURE GRANS (ABS): 0 10*3/uL (ref 0.0–0.1)
IMMATURE GRANULOCYTES: 0 %
LYMPHS: 51 %
Lymphocytes Absolute: 3.1 10*3/uL (ref 0.7–3.1)
MCH: 30.5 pg (ref 26.6–33.0)
MCHC: 33 g/dL (ref 31.5–35.7)
MCV: 92 fL (ref 79–97)
MONOCYTES: 5 %
Monocytes Absolute: 0.3 10*3/uL (ref 0.1–0.9)
NEUTROS ABS: 2.5 10*3/uL (ref 1.4–7.0)
NEUTROS PCT: 42 %
PLATELETS: 394 10*3/uL — AB (ref 150–379)
RBC: 3.54 x10E6/uL — ABNORMAL LOW (ref 3.77–5.28)
RDW: 14.3 % (ref 12.3–15.4)
WBC: 5.9 10*3/uL (ref 3.4–10.8)

## 2017-07-10 LAB — TSH: TSH: 5.1 u[IU]/mL — ABNORMAL HIGH (ref 0.450–4.500)

## 2017-07-10 NOTE — Progress Notes (Signed)
Janet Bellows MD, MRCP(U.K) [address]  [place]  [place], [place] [address]  Main: [phone]  Fax: [phone]   Primary Care Physician: Kathrine Haddock, NP  Primary Gastroenterologist:  Dr. Jonathon [name]   Chief Complaint  Patient presents with  . Follow-up    HPI: Janet [name] is a 61 y.o. female    Summary of history :  Patient is here today follow-up for dysphagia.  She was last seen on 05/07/2017 for GERD.  She also had mentioned abnormal weight loss.  She said she has had reflux since July 2018.  She has bad teeth that had a hard time chewing her food and had difficulty swallowing her food.  She has tried PPI as well as Zantac in the past and said it has not helped her.  She has lost weight unsure amount of weight she has lost and  over what period of time.  Interval history 05/08/2079 07/10/2017  I performed an EGD as well as colonoscopy in 05/13/2017  , EGD showed no abrnomality and biopsies showed features of reflux. Colonoscopy revealed multiple tubular adenomas.   Lost 16 ;lbs of weight since last visit  Saw dentist after her last visit and had all her teeth extracted and is due to get false teeth . Denies any abdominal pain . Has bowel movements every 3-4 days . No reflux , says not taking her PPI. Quit smoking . Denies any vaginal bleeding . Apetite is good but cant eat due to lack of teeth .    Current Outpatient Medications  Medication Sig Dispense Refill  . amLODipine (NORVASC) 5 MG tablet TAKE 1 TABLET DAILY 90 tablet 1  . atorvastatin (LIPITOR) 10 MG tablet TAKE 1 TABLET DAILY AT 6 P.M. 90 tablet 1  . levothyroxine (SYNTHROID, LEVOTHROID) 50 MCG tablet Take 1 tablet (50 mcg total) by mouth daily. 90 tablet 3  . lisinopril (PRINIVIL,ZESTRIL) 20 MG tablet TAKE 1 TABLET DAILY 90 tablet 1  . methocarbamol (ROBAXIN) 500 MG tablet Take 500 mg by mouth 2 (two) times daily.    . naproxen (NAPROSYN) 500 MG tablet Take 500 mg by mouth 2 (two) times daily.      Marland Kitchen omeprazole (PRILOSEC) 40 MG capsule Take 1 capsule (40 mg total) by mouth daily. 30 capsule 1  . polyethylene glycol (MIRALAX) packet Take 17 g by mouth daily. 14 each 0   No current facility-administered medications for this visit.     Allergies as of 07/10/2017  . (No Known Allergies)    ROS:  General: Negative for anorexia, weight loss, fever, chills, fatigue, weakness. ENT: Negative for hoarseness, difficulty swallowing , nasal congestion. CV: Negative for chest pain, angina, palpitations, dyspnea on exertion, peripheral edema.  Respiratory: Negative for dyspnea at rest, dyspnea on exertion, cough, sputum, wheezing.  GI: See history of present illness. GU:  Negative for dysuria, hematuria, urinary incontinence, urinary frequency, nocturnal urination.  Endo: Negative for unusual weight change.    Physical Examination:   BP 126/76 (BP Location: Left Arm, Patient Position: Sitting, Cuff Size: Normal)   Pulse (!) 52   Temp 97.9 F (36.6 C) (Oral)   Ht 4' 11.5" (1.511 m)   Wt 120 lb 12.8 oz (54.8 kg)   LMP 08/08/2003 (Approximate)   BMI 23.99 kg/m   General: Well-nourished, well-developed in no acute distress.  Eyes: No icterus. Conjunctivae pink. Mouth: No teeth  Lungs: Clear to auscultation bilaterally. Non-labored. Heart: Regular rate and rhythm, no murmurs rubs or  gallops.  Abdomen: Bowel sounds are normal, nontender, nondistended, no hepatosplenomegaly or masses, no abdominal bruits or hernia , no rebound or guarding.   Extremities: No lower extremity edema. No clubbing or deformities. Neuro: Alert and oriented x 3.  Grossly intact. Skin: Warm and dry, no jaundice.   Psych: Alert and cooperative, normal mood and affect.   Imaging Studies: No results found.  Assessment and Plan:   Janet [name] is a 61 y.o. y/o female here to follow up  for GERD. Her history is suggestive of weight loss , constipation.   Plan  1. Linzess 290 mcg once day - for  constipation  2. Ensure TID 3. High fiber diet -print out provided  4. CT abdomen/pelvis to be done at the the same time she has a CT chest ordered r/o malignancy due to weight loss, my office will call and help coordinate the same    Dr Janet Bellows  MD,MRCP [facility]) Follow up in 8 weeks

## 2017-07-11 ENCOUNTER — Telehealth: Payer: Self-pay | Admitting: Unknown Physician Specialty

## 2017-07-11 ENCOUNTER — Encounter: Payer: Self-pay | Admitting: *Deleted

## 2017-07-11 NOTE — Telephone Encounter (Signed)
Left message that Janet [name] will be in contace

## 2017-07-11 NOTE — Progress Notes (Signed)
  Oncology Nurse Navigator Documentation  Navigator Location: [facility] (07/11/17 1500) Referral date to RadOnc/MedOnc: 07/11/17 (07/11/17 1500) )Navigator Encounter Type: Introductory phone call (07/11/17 1500)   Abnormal Finding Date: 07/10/17 (07/11/17 1500)                   Treatment Phase: Abnormal Scans (07/11/17 1500) Barriers/Navigation Needs: Coordination of Care (07/11/17 1500)   Interventions: Coordination of Care (07/11/17 1500)   Coordination of Care: Appts (07/11/17 1500)        Acuity: Level 2 (07/11/17 1500)   Acuity Level 2: Initial guidance, education and coordination as needed;Assistance expediting appointments (07/11/17 1500)  message left with patient introducing to navigator services. Pt informed that I will follow up results on CT scan scheduled for next Thursday and schedule further appts as necessary. Contact info given and instructed pt to call with any further questions or needs but informed to be expecting another phone call next week regarding further appts needed.    Time Spent with Patient: 15 (07/11/17 1500)

## 2017-07-17 ENCOUNTER — Ambulatory Visit
Admission: RE | Admit: 2017-07-17 | Discharge: 2017-07-17 | Disposition: A | Discharge status: Home / Self Care | Payer: Managed Care, Other (non HMO) | Source: Ambulatory Visit | Attending: Unknown Physician Specialty | Admitting: Unknown Physician Specialty

## 2017-07-17 DIAGNOSIS — I700 Atherosclerosis of aorta: Secondary | ICD-10-CM | POA: Diagnosis not present

## 2017-07-17 DIAGNOSIS — Z90710 Acquired absence of both cervix and uterus: Secondary | ICD-10-CM | POA: Diagnosis not present

## 2017-07-17 DIAGNOSIS — R634 Abnormal weight loss: Secondary | ICD-10-CM | POA: Diagnosis present

## 2017-07-17 DIAGNOSIS — R911 Solitary pulmonary nodule: Secondary | ICD-10-CM | POA: Diagnosis present

## 2017-07-17 DIAGNOSIS — IMO0001 Reserved for inherently not codable concepts without codable children: Secondary | ICD-10-CM

## 2017-07-17 DIAGNOSIS — R918 Other nonspecific abnormal finding of lung field: Secondary | ICD-10-CM | POA: Diagnosis not present

## 2017-07-17 MED ORDER — IOPAMIDOL (ISOVUE-300) INJECTION 61%
100.0000 mL | Freq: Once | INTRAVENOUS | Status: AC | PRN
  Administered 2017-07-17: 85 mL via INTRAVENOUS

## 2017-07-18 ENCOUNTER — Other Ambulatory Visit: Payer: Self-pay | Admitting: *Deleted

## 2017-07-18 ENCOUNTER — Telehealth: Payer: Self-pay | Admitting: Oncology

## 2017-07-18 ENCOUNTER — Telehealth: Payer: Self-pay | Admitting: *Deleted

## 2017-07-18 DIAGNOSIS — R918 Other nonspecific abnormal finding of lung field: Secondary | ICD-10-CM

## 2017-07-18 NOTE — Telephone Encounter (Signed)
Spoke with pt's sister, Joycelyn Schmid, who stated would let the pt know to give me a call back on Monday. Message left with Concettina with contact info to call me back on Monday.

## 2017-07-18 NOTE — Telephone Encounter (Signed)
Message left with patient to call back to review CT results as well as scheduled appts next week. Awaiting call back at this time. Will try calling pt again on Monday if doesn't call back today.

## 2017-07-18 NOTE — Telephone Encounter (Signed)
Patient is a 61 year old female who had a a chest x-ray which noted a spiculated density in her right upper lobe.  She has a history of tobacco use and had complained of unintentional weight loss.  Subsequent CT scan completed on July 17, 2017 revealed a 3.1 cm central right upper lobe spiculated mass compatible with a primary bronchogenic lung carcinoma.  Also noted were enlarged superior hilar adenopathy concerning for nodal metastasis.  Patient has been referred to the cancer center and will require a PET scan for further evaluation and to optimize management.  PET scan is also needed to assist in further diagnostic studies including possible biopsy.

## 2017-07-21 ENCOUNTER — Encounter: Payer: Self-pay | Admitting: *Deleted

## 2017-07-21 NOTE — Progress Notes (Signed)
  Oncology Nurse Navigator Documentation  Navigator Location: [facility] (07/21/17 0800)   )Navigator Encounter Type: Telephone (07/21/17 0800) Telephone: Incoming Call;Appt Confirmation/Clarification (07/21/17 0800)                       Barriers/Navigation Needs: Coordination of Care (07/21/17 0800)   Interventions: Coordination of Care;Education (07/21/17 0800)   Coordination of Care: Appts;Radiology (07/21/17 0800) Education Method: Verbal (07/21/17 0800)          Oncology Nurse Navigator Documentation  Navigator Location: [facility] (07/21/17 0800)   )Navigator Encounter Type: Telephone (07/21/17 0800) Telephone: Incoming Call;Appt Confirmation/Clarification (07/21/17 0800)                       Barriers/Navigation Needs: Coordination of Care (07/21/17 0800)   Interventions: Coordination of Care;Education (07/21/17 0800)   Coordination of Care: Appts;Radiology (07/21/17 0800) Education Method: Verbal (07/21/17 0800)       pt called in to confirm appts scheduled this week for PET scan and consultation with med-onc. Reviewed appt scheduled for PET scan on Wed 12/19 at 12:30pm. Instructed to arrive at 12pm at the medical mall and to remain NPO after midnight the night before scan. Informed that may take meds on empty stomach if tolerable with a small sip of water. Reviewed with pt appt scheduled with Dr. Grayland Ormond on Thursday 12/20 at 10am. Pt requested earlier appt due to conflict in schedule. appt on 12/20 moved to 8:45am. Informed pt that will notify her if any appts need to be changed. Instructed pt to call with any questions. Pt verbalized understanding and confirmed upcoming appts.         Time Spent with Patient: 30 (07/21/17 0800)            Time Spent with Patient: 30 (07/21/17 0800)

## 2017-07-22 ENCOUNTER — Telehealth: Payer: Self-pay | Admitting: *Deleted

## 2017-07-22 ENCOUNTER — Other Ambulatory Visit: Payer: Self-pay | Admitting: *Deleted

## 2017-07-22 NOTE — Progress Notes (Signed)
[facility]  [phone] [phone]  ID: Janet [name] OB: 28-Oct-1955  MR#: [id number]  [facility]#:[id number]  [name], [name] [name], RN as Registered Nurse  CHIEF COMPLAINT: Right upper lobe lung mass.  INTERVAL HISTORY: Patient is a 61 year old female who had a chest x-ray and subsequent CT scan after workup for reported unintentional weight loss. CT scan revealed a right upper lobe mass highly concerning for malignancy.  Currently, she is anxious but otherwise feels well.  She has no neurologic complaints.  She denies any recent fevers or illnesses. She has a good appetite.  She denies any chest pain, shortness of breath, hemoptysis, or cough.  She has no nausea, vomiting, constipation, or diarrhea.  She has no melena or hematochezia.  Patient otherwise feels well and offers no further specific complaints today.  REVIEW OF SYSTEMS:   Review of Systems  Constitutional: Positive for weight loss. Negative for fever and malaise/fatigue.  Respiratory: Negative.  Negative for cough and shortness of breath.   Cardiovascular: Negative.  Negative for chest pain and leg swelling.  Gastrointestinal: Negative.  Negative for abdominal pain, blood in stool and melena.  Genitourinary: Negative.  Negative for dysuria.  Musculoskeletal: Negative.   Skin: Negative.  Negative for rash.  Neurological: Negative.  Negative for sensory change and weakness.  Psychiatric/Behavioral: The patient is nervous/anxious.     As per HPI. Otherwise, a complete review of systems is negative.  PAST MEDICAL HISTORY: Past Medical History:  Diagnosis Date  . Hyperlipidemia   . Hypertension   . Hypothyroidism   . Menopausal state   . Thyroid disease     PAST SURGICAL HISTORY: Past Surgical History:  Procedure Laterality Date  . ABDOMINAL HYSTERECTOMY  2005  . BREAST EXCISIONAL BIOPSY Right 1979  .  COLONOSCOPY WITH PROPOFOL N/A 05/13/2017   Procedure: COLONOSCOPY WITH PROPOFOL;  Surgeon: [name], MD;  Location: [facility] ENDOSCOPY;  Service: Gastroenterology;  Laterality: N/A;  . CYSTECTOMY Right    breast  . CYSTECTOMY  02/2015   back of neck  . ESOPHAGOGASTRODUODENOSCOPY (EGD) WITH PROPOFOL N/A 05/13/2017   Procedure: ESOPHAGOGASTRODUODENOSCOPY (EGD) WITH PROPOFOL;  Surgeon: [name], MD;  Location: [facility] ENDOSCOPY;  Service: Gastroenterology;  Laterality: N/A;  . TUBAL LIGATION  1986    FAMILY HISTORY: Family History  Problem Relation Age of Onset  . Hypertension Mother   . Stroke Mother   . Leukemia Mother   . Stroke Father   . Pneumonia Father   . Diabetes Sister   . Hyperlipidemia Sister   . Breast cancer Maternal Aunt 5    ADVANCED DIRECTIVES (Y/N):  N  HEALTH MAINTENANCE: Social History   Tobacco Use  . Smoking status: Former Smoker    Packs/day: 0.25    Types: Cigarettes    Last attempt to quit: 02/01/2017    Years since quitting: 0.4  . Smokeless tobacco: Never Used  Substance Use Topics  . Alcohol use: No    Alcohol/week: 0.0 oz  . Drug use: No     Colonoscopy:  PAP:  Bone density:  Lipid panel:  No Known Allergies  Current Outpatient Medications  Medication Sig Dispense Refill  . amLODipine (NORVASC) 5 MG tablet TAKE 1 TABLET DAILY 90 tablet 1  . atorvastatin (LIPITOR) 10 MG tablet TAKE 1 TABLET DAILY AT 6 P.M. 90 tablet 1  . levothyroxine (SYNTHROID, LEVOTHROID) 50 MCG tablet Take 1 tablet (50 mcg total) by mouth  daily. 90 tablet 3  . lisinopril (PRINIVIL,ZESTRIL) 20 MG tablet TAKE 1 TABLET DAILY 90 tablet 1  . omeprazole (PRILOSEC) 40 MG capsule Take 1 capsule (40 mg total) by mouth daily. (Patient not taking: Reported on 07/24/2017) 30 capsule 1  . polyethylene glycol (MIRALAX) packet Take 17 g by mouth daily. (Patient not taking: Reported on 07/24/2017) 14 each 0   No current facility-administered medications for this visit.      OBJECTIVE: Vitals:   07/24/17 0849  BP: (!) 176/80  Pulse: 64  Resp: 20  Temp: (!) 96 F (35.6 C)     Body mass index is 25.14 kg/m.    ECOG FS:0 - Asymptomatic  General: Well-developed, well-nourished, no acute distress. Eyes: Pink conjunctiva, anicteric sclera. HEENT: Normocephalic, moist mucous membranes, clear oropharnyx. Lungs: Clear to auscultation bilaterally. Heart: Regular rate and rhythm. No rubs, murmurs, or gallops. Abdomen: Soft, nontender, nondistended. No organomegaly noted, normoactive bowel sounds. Musculoskeletal: No edema, cyanosis, or clubbing. Neuro: Alert, answering all questions appropriately. Cranial nerves grossly intact. Skin: No rashes or petechiae noted. Psych: Normal affect. Lymphatics: No cervical, calvicular, axillary or inguinal LAD.   LAB RESULTS:  Lab Results  Component Value Date   NA 147 (H) 07/09/2017   K 4.3 07/09/2017   CL 103 07/09/2017   CO2 29 07/09/2017   GLUCOSE 93 07/09/2017   BUN 8 07/09/2017   CREATININE 0.71 07/09/2017   CALCIUM 9.6 07/09/2017   PROT 6.6 07/09/2017   ALBUMIN 4.3 07/09/2017   AST 13 07/09/2017   ALT 6 07/09/2017   ALKPHOS 59 07/09/2017   BILITOT <0.2 07/09/2017   GFRNONAA 92 07/09/2017   GFRAA 106 07/09/2017    Lab Results  Component Value Date   WBC 5.9 07/09/2017   NEUTROABS 2.5 07/09/2017   HGB 10.8 (L) 07/09/2017   HCT 32.7 (L) 07/09/2017   MCV 92 07/09/2017   PLT 394 (H) 07/09/2017     STUDIES: Dg Chest 2 View  Result Date: 07/10/2017 CLINICAL DATA:  Review 40 pound weight loss over a six-month period. Patient discontinued smoking in June 2018. No current symptoms. EXAM: CHEST  2 VIEW COMPARISON:  None in PACs FINDINGS: The lungs are adequately inflated. There is subtle increased density in the right upper lobe superior-medially. The heart and pulmonary vascularity are normal. The mediastinum is normal in width. No hilar masses are observed. There is an ovoid 8 x 12 mm soft tissue  nodular density in the retrocardiac region on the lateral view which likely reflects a pulmonary vessel on hand. It is not seen on the frontal view. There is no pleural effusion. There is calcification in the wall of the thoracic aorta. There is mild degenerative disc disease of the thoracic spine. IMPRESSION: Ill-defined increased density in the right upper lobe medially. This may reflect a confluence of densities. Given the patient's symptoms and smoking history, chest CT scanning is recommended. No CHF nor pneumonia. Thoracic aortic atherosclerosis. Electronically Signed   By: David  Martinique M.D.   On: 07/10/2017 14:17   Ct Chest Wo Contrast  Result Date: 07/17/2017 CLINICAL DATA:  Weight loss, previous smoker, right upper lobe spiculated density by chest x-ray. EXAM: CT CHEST WITHOUT CONTRAST TECHNIQUE: Multidetector CT imaging of the chest was performed following the standard protocol without IV contrast. COMPARISON:  07/10/2017, 11/21/2014 FINDINGS: Cardiovascular: Limited without IV contrast. Atherosclerosis of the major branch vessels and thoracic aorta. No significant aneurysm. No enlarged mediastinal hemorrhage or hematoma. Native coronary atherosclerosis noted. Normal heart  size. No pericardial effusion. Mediastinum/Nodes: Limited without IV contrast. Right superior hilar lymph node suspected measuring 17 mm in short axis, image 40 series 2. Thyroid, trachea, esophagus are within normal limits for noncontrast imaging. No hiatal hernia. Lungs/Pleura: Central right upper lobe spiculated mass now measures 3.1 x 2.2 x 1.9 cm. Mass is compatible with a primary bronchogenic lung carcinoma. This has progressed when compared to a neck CT from 11/21/2014. Additional nodules noted in the right lung, 1 measuring 4 mm in the right lower lobe laterally, image 67 and a second in the inferior right lower lobe measure 5 mm, image 92 series 3. These are indeterminate but concerning for small metastases. Tiny  indeterminate nodule also noted in medial left lower lobe, image 73 series 3 measuring 3 mm. Additional 3 mm nodule in the superior segment of the left lower lobe, image 52 series 3. No superimposed pneumonia, collapse or consolidation. No pleural effusion or pneumothorax. Trachea and central airways are patent. Upper Abdomen: Abdominal atherosclerosis noted. No acute upper abdominal finding. Musculoskeletal: Degenerative changes throughout the spine. No acute osseous finding or fracture. Intact sternum. IMPRESSION: 3.1 cm central right upper lobe spiculated mass compatible with primary bronchogenic lung carcinoma. Study is limited without IV contrast. Noncontrast CT findings are also suspicious for right superior hilar adenopathy concerning for nodal metastasis. Bilateral lower lobe indeterminate subcentimeter pulmonary nodules measuring up to 5 mm in the right lower lobe. Difficult to exclude small metastases. These results will be called to the ordering clinician or representative by the Radiologist Assistant, and communication documented in the PACS or zVision Dashboard. Aortic Atherosclerosis (ICD10-I70.0). Electronically Signed   By: Jerilynn Mages.  Shick M.D.   On: 07/17/2017 17:15   Ct Abdomen Pelvis W Contrast  Result Date: 07/17/2017 CLINICAL DATA:  Weight loss, constipation, reflux EXAM: CT ABDOMEN AND PELVIS WITH CONTRAST TECHNIQUE: Multidetector CT imaging of the abdomen and pelvis was performed using the standard protocol following bolus administration of intravenous contrast. CONTRAST:  34mL ISOVUE-300 IOPAMIDOL (ISOVUE-300) INJECTION 61% COMPARISON:  None. FINDINGS: Lower chest: No acute abnormality. Hepatobiliary: No focal liver abnormality is seen. No gallstones, gallbladder wall thickening, or biliary dilatation. Pancreas: Unremarkable. No pancreatic ductal dilatation or surrounding inflammatory changes. Spleen: Normal in size without focal abnormality. Adrenals/Urinary Tract: Adrenal glands are  unremarkable. Kidneys are normal, without renal calculi, focal lesion, or hydronephrosis. Bladder is unremarkable. Stomach/Bowel: Stomach is within normal limits. Appendix appears normal. No evidence of bowel wall thickening, distention, or inflammatory changes. Vascular/Lymphatic: Aortic atherosclerosis noted. Negative for aneurysm, dissection or occlusive process. No retroperitoneal abnormality or hemorrhage. No adenopathy. Reproductive: Remote hysterectomy. No adnexal abnormality or mass. No pelvic free fluid. Other: No abdominal wall hernia or abnormality. No abdominopelvic ascites. Musculoskeletal: Degenerative changes noted throughout the spine. No acute osseous finding. IMPRESSION: No acute intra-abdominal or pelvic finding by CT. Abdominal atherosclerosis without aneurysm Remote hysterectomy Electronically Signed   By: Jerilynn Mages.  Shick M.D.   On: 07/17/2017 17:00    ASSESSMENT: Right upper lobe lung mass  PLAN:    1. Right upper lobe lung mass: CT results reviewed independently and reported as above highly suspicious for underlying malignancy.  Because of the upcoming holidays, both additional imaging and pulmonary evaluation for biopsy have been delayed. She has a PET scan as well as an MRI of the brain scheduled on August 04, 2017.  Initial consult with pulmonary is on August 08, 2017.  She will likely require bronchoscopy and/or EBUS.  Return to clinic 1 week after her biopsy  to discuss the results and treatment planning.  Approximately 60 minutes was spent in discussion of which greater than 50% was consultation.  Patient expressed understanding and was in agreement with this plan. She also understands that She can call clinic at any time with any questions, concerns, or complaints.   Cancer Staging No matching staging information was found for the patient.  Lloyd Huger, MD   07/26/2017 10:49 AM

## 2017-07-22 NOTE — Telephone Encounter (Signed)
Message left with patient to notify her that her PET scan scheduled for 12/19 has been cancelled. Pt informed that Dr. Grayland Ormond still wants to see her at her scheduled appt on 12/20 at the cancer center at 8:45am to review CT scan and discuss next steps. Instructed pt to call back with any further questions.

## 2017-07-23 ENCOUNTER — Ambulatory Visit: Payer: Managed Care, Other (non HMO)

## 2017-07-24 ENCOUNTER — Inpatient Hospital Stay: Payer: Managed Care, Other (non HMO) | Attending: Oncology | Admitting: Oncology

## 2017-07-24 ENCOUNTER — Other Ambulatory Visit: Payer: Self-pay

## 2017-07-24 ENCOUNTER — Encounter: Payer: Self-pay | Admitting: Oncology

## 2017-07-24 ENCOUNTER — Encounter: Payer: Self-pay | Admitting: *Deleted

## 2017-07-24 VITALS — BP 176/80 | HR 64 | Temp 96.0°F | Resp 20 | Wt 126.6 lb

## 2017-07-24 DIAGNOSIS — F419 Anxiety disorder, unspecified: Secondary | ICD-10-CM | POA: Diagnosis not present

## 2017-07-24 DIAGNOSIS — R419 Unspecified symptoms and signs involving cognitive functions and awareness: Secondary | ICD-10-CM

## 2017-07-24 DIAGNOSIS — Z780 Asymptomatic menopausal state: Secondary | ICD-10-CM | POA: Diagnosis not present

## 2017-07-24 DIAGNOSIS — R918 Other nonspecific abnormal finding of lung field: Secondary | ICD-10-CM | POA: Insufficient documentation

## 2017-07-24 DIAGNOSIS — R634 Abnormal weight loss: Secondary | ICD-10-CM

## 2017-07-24 DIAGNOSIS — Z79899 Other long term (current) drug therapy: Secondary | ICD-10-CM | POA: Diagnosis not present

## 2017-07-24 DIAGNOSIS — Z90710 Acquired absence of both cervix and uterus: Secondary | ICD-10-CM | POA: Diagnosis not present

## 2017-07-24 DIAGNOSIS — E785 Hyperlipidemia, unspecified: Secondary | ICD-10-CM | POA: Insufficient documentation

## 2017-07-24 DIAGNOSIS — E039 Hypothyroidism, unspecified: Secondary | ICD-10-CM

## 2017-07-24 DIAGNOSIS — M5134 Other intervertebral disc degeneration, thoracic region: Secondary | ICD-10-CM | POA: Diagnosis not present

## 2017-07-24 DIAGNOSIS — Z87891 Personal history of nicotine dependence: Secondary | ICD-10-CM | POA: Insufficient documentation

## 2017-07-24 DIAGNOSIS — Z803 Family history of malignant neoplasm of breast: Secondary | ICD-10-CM | POA: Insufficient documentation

## 2017-07-24 DIAGNOSIS — I2510 Atherosclerotic heart disease of native coronary artery without angina pectoris: Secondary | ICD-10-CM | POA: Insufficient documentation

## 2017-07-24 DIAGNOSIS — Z806 Family history of leukemia: Secondary | ICD-10-CM | POA: Insufficient documentation

## 2017-07-24 DIAGNOSIS — Z8052 Family history of malignant neoplasm of bladder: Secondary | ICD-10-CM

## 2017-07-24 DIAGNOSIS — I10 Essential (primary) hypertension: Secondary | ICD-10-CM | POA: Diagnosis not present

## 2017-07-24 NOTE — Progress Notes (Signed)
Patient here today for new evaluation regarding lung mass.

## 2017-07-24 NOTE — Progress Notes (Signed)
  Oncology Nurse Navigator Documentation  Navigator Location: [facility] (07/24/17 0900)   )Navigator Encounter Type: Initial MedOnc (07/24/17 0900)                       Treatment Phase: Abnormal Scans (07/24/17 0900) Barriers/Navigation Needs: Coordination of Care (07/24/17 0900)   Interventions: Coordination of Care (07/24/17 0900)   Coordination of Care: Appts;Radiology (07/24/17 0900)     met with patient during initial med-onc consultation with Dr. Grayland Ormond. All questions answered at the time of visit. Pt informed that will be notified this afternoon or tomorrow morning with biopsy plan. Pt aware that we are still working with her insurance to get PET scan approved. Once approved, pt was informed that will schedule her PET along with a Brain MRI. Contact info given to patient and instructed to call with any questions or needs. Pt verbalized understanding.             Time Spent with Patient: 60 (07/24/17 0900)

## 2017-07-25 ENCOUNTER — Telehealth: Payer: Self-pay | Admitting: *Deleted

## 2017-07-25 ENCOUNTER — Institutional Professional Consult (permissible substitution): Payer: Managed Care, Other (non HMO) | Admitting: Internal Medicine

## 2017-07-25 NOTE — Telephone Encounter (Signed)
Spoke with patient this morning regarding appts scheduled for PET scan, brain MRI, and consult with Dr. Juanell Fairly. Pt confirmed all appts and requested that appts be mailed to her. AVS printed and mailed out to patient. Instructed pt that once we know when her biopsy is scheduled then she can plan on seeing Dr. Grayland Ormond about 4 days after that to review results and discuss treatment planning. Instructed to call with any further questions or concerns. Pt verbalized understanding.

## 2017-08-04 ENCOUNTER — Ambulatory Visit
Admission: RE | Admit: 2017-08-04 | Discharge: 2017-08-04 | Disposition: A | Discharge status: Home / Self Care | Payer: Managed Care, Other (non HMO) | Source: Ambulatory Visit | Attending: Oncology | Admitting: Oncology

## 2017-08-04 DIAGNOSIS — R918 Other nonspecific abnormal finding of lung field: Secondary | ICD-10-CM

## 2017-08-04 MED ORDER — GADOBENATE DIMEGLUMINE 529 MG/ML IV SOLN
10.0000 mL | Freq: Once | INTRAVENOUS | Status: AC | PRN
  Administered 2017-08-04: 10 mL via INTRAVENOUS

## 2017-08-05 DIAGNOSIS — Z923 Personal history of irradiation: Secondary | ICD-10-CM

## 2017-08-05 DIAGNOSIS — C3491 Malignant neoplasm of unspecified part of right bronchus or lung: Secondary | ICD-10-CM

## 2017-08-05 DIAGNOSIS — Z9221 Personal history of antineoplastic chemotherapy: Secondary | ICD-10-CM

## 2017-08-05 HISTORY — DX: Personal history of irradiation: Z92.3

## 2017-08-05 HISTORY — DX: Malignant neoplasm of unspecified part of right bronchus or lung: C34.91

## 2017-08-05 HISTORY — DX: Personal history of antineoplastic chemotherapy: Z92.21

## 2017-08-07 NOTE — Progress Notes (Signed)
Janet [name]      Assessment and Plan:  Lung nodule --3 cm RUL lung nodule.  --right supraclavicular lymphadenopathy, which lights up on PET scan. Will send for needle biopsy of this node.   Mediastinal lymphadenopathy.  --Suspect due to lung cancer.  --As above will send for supraclavicular node biopsy. If non-diagnostic, will plan for EBUS bronchoscopy.   Date: 08/08/2017  MRN# [id number] Janet [name] 06/10/1956  Referring Physician: Dr. Grayland [name].   Janet [name] is a 62 y.o. old female seen in [name] for chief complaint of:    Chief Complaint  Patient presents with  . Advice Only    lung mass:     HPI:  She notes that in August of 2018 she was seen by her PCP she had been losing weight. She was having symptoms of acid reflux. She was also sent for a CXR which was abnormal, this showed a lung nodule and further workup was undertaken. She has lost 30 lbs or more.  She has no dyspnea or cough. She denies any respiratory symptoms.  She smoked half ppd until June of 2018.   Images personally reviewed, CT chest 07/17/17; 3 cm nodule noted in the right upper lobe.  Right paratracheal lymphadenopathy is noted, however there is no contrast to delineate this.  MRI of the head on 12/31 and CT abdomen pelvis on 12/13 were negative for metastatic disease.  PET scan 08/08/17 shows right upper lobe nodule with increased uptake, which is contiguous with right paratracheal nodule, right 11 R nodule, with mild uptake in a subcarinal nodule as well.  There also appears to be increased uptake in the right clavicular node  PMHX:   Past Medical History:  Diagnosis Date  . Hyperlipidemia   . Hypertension   . Hypothyroidism   . Menopausal state   . Thyroid disease    Surgical Hx:  Past Surgical History:  Procedure Laterality Date  . ABDOMINAL HYSTERECTOMY  2005  . BREAST EXCISIONAL BIOPSY Right 1979  . COLONOSCOPY WITH PROPOFOL N/A 05/13/2017   Procedure: COLONOSCOPY WITH PROPOFOL;  Surgeon: Janet Bellows, MD;  Location: [facility] ENDOSCOPY;  Service: Gastroenterology;  Laterality: N/A;  . CYSTECTOMY Right    breast  . CYSTECTOMY  02/2015   back of neck  . ESOPHAGOGASTRODUODENOSCOPY (EGD) WITH PROPOFOL N/A 05/13/2017   Procedure: ESOPHAGOGASTRODUODENOSCOPY (EGD) WITH PROPOFOL;  Surgeon: Janet Bellows, MD;  Location: [facility] ENDOSCOPY;  Service: Gastroenterology;  Laterality: N/A;  . TUBAL LIGATION  1986   Family Hx:  Family History  Problem Relation Age of Onset  . Hypertension Mother   . Stroke Mother   . Leukemia Mother   . Stroke Father   . Pneumonia Father   . Diabetes Sister   . Hyperlipidemia Sister   . Breast cancer Maternal Aunt 70   Social Hx:   Social History   Tobacco Use  . Smoking status: Former Smoker    Packs/day: 0.25    Types: Cigarettes    Last attempt to quit: 02/01/2017    Years since quitting: 0.5  . Smokeless tobacco: Never Used  Substance Use Topics  . Alcohol use: No    Alcohol/week: 0.0 oz  . Drug use: No   Medication:    Current Outpatient Medications:  .  amLODipine (NORVASC) 5 MG tablet, TAKE 1 TABLET DAILY, Disp: 90 tablet, Rfl: 1 .  atorvastatin (LIPITOR) 10 MG tablet, TAKE 1 TABLET DAILY AT 6 P.M., Disp: 90 tablet, Rfl: 1 .  levothyroxine (SYNTHROID, LEVOTHROID) 50 MCG tablet, Take 1 tablet (50 mcg total) by mouth daily., Disp: 90 tablet, Rfl: 3 .  lisinopril (PRINIVIL,ZESTRIL) 20 MG tablet, TAKE 1 TABLET DAILY, Disp: 90 tablet, Rfl: 1 .  omeprazole (PRILOSEC) 40 MG capsule, Take 1 capsule (40 mg total) by mouth daily., Disp: 30 capsule, Rfl: 1 .  polyethylene glycol (MIRALAX) packet, Take 17 g by mouth daily., Disp: 14 each, Rfl: 0   Allergies:  Patient has no known allergies.  Review of Systems: Gen:  Denies  fever, sweats, chills HEENT: Denies blurred vision, double vision. bleeds, sore throat Cvc:  No dizziness, chest pain. Resp:   Denies cough or sputum production, shortness of  breath Gi: Denies swallowing difficulty, stomach pain. Gu:  Denies bladder incontinence, burning urine Ext:   No Joint pain, stiffness. Skin: No skin rash,  hives  Endoc:  No polyuria, polydipsia. Psych: No depression, insomnia. Other:  All other systems were reviewed with the patient and were negative other that what is mentioned in the HPI.   Physical Examination:   VS: BP 130/72 (BP Location: Left Arm, Cuff Size: Normal)   Pulse 65   Resp 16   Ht 5' (1.524 m)   Wt 119 lb (54 kg)   LMP 08/08/2003 (Approximate) Comment: Hysterectomy 2004  SpO2 98%   BMI 23.24 kg/m   General Appearance: No distress  Neuro:without focal findings,  speech normal,  HEENT: PERRLA, EOM intact.  Palpable right supraclavicular node with tenderness.  Pulmonary: normal breath sounds, No wheezing.  CardiovascularNormal S1,S2.  No m/r/g.   Abdomen: Benign, Soft, non-tender. Renal:  No costovertebral tenderness  GU:  No performed at this time. Endoc: No evident thyromegaly, no signs of acromegaly. Skin:   warm, no rashes, no ecchymosis  Extremities: normal, no cyanosis, clubbing.  Other findings:    LABORATORY PANEL:   CBC No results for input(s): WBC, HGB, HCT, PLT in the last 168 hours. ------------------------------------------------------------------------------------------------------------------  Chemistries  No results for input(s): NA, K, CL, CO2, GLUCOSE, BUN, CREATININE, CALCIUM, MG, AST, ALT, ALKPHOS, BILITOT in the last 168 hours.  Invalid input(s): GFRCGP ------------------------------------------------------------------------------------------------------------------  Cardiac Enzymes No results for input(s): TROPONINI in the last 168 hours. ------------------------------------------------------------  RADIOLOGY:  No results found.     Thank  you for the [name] and for allowing [facility] to assist in the care of your patient. Our  recommendations are noted above.  Please contact us if we can be of further service.   Janet Stalker, MD.  Board Certified in Internal Medicine, Pulmonary Medicine, [place], and Sleep Medicine.   Pulmonary and Critical Care Office Number: [phone]  Patricia Pesa, M.D.  Merton Border, M.D  08/08/2017

## 2017-08-08 ENCOUNTER — Ambulatory Visit (INDEPENDENT_AMBULATORY_CARE_PROVIDER_SITE_OTHER): Payer: Managed Care, Other (non HMO) | Admitting: Internal Medicine

## 2017-08-08 ENCOUNTER — Ambulatory Visit
Admission: RE | Admit: 2017-08-08 | Discharge: 2017-08-08 | Disposition: A | Discharge status: Home / Self Care | Payer: Managed Care, Other (non HMO) | Source: Ambulatory Visit | Attending: Oncology | Admitting: Oncology

## 2017-08-08 ENCOUNTER — Encounter: Payer: Self-pay | Admitting: Unknown Physician Specialty

## 2017-08-08 ENCOUNTER — Ambulatory Visit (INDEPENDENT_AMBULATORY_CARE_PROVIDER_SITE_OTHER): Payer: Managed Care, Other (non HMO) | Admitting: Unknown Physician Specialty

## 2017-08-08 ENCOUNTER — Encounter: Payer: Self-pay | Admitting: Internal Medicine

## 2017-08-08 VITALS — BP 127/75 | HR 60 | Temp 97.6°F | Wt 119.6 lb

## 2017-08-08 VITALS — BP 130/72 | HR 65 | Resp 16 | Ht 60.0 in | Wt 119.0 lb

## 2017-08-08 DIAGNOSIS — R918 Other nonspecific abnormal finding of lung field: Secondary | ICD-10-CM | POA: Diagnosis not present

## 2017-08-08 DIAGNOSIS — R590 Localized enlarged lymph nodes: Secondary | ICD-10-CM | POA: Diagnosis not present

## 2017-08-08 DIAGNOSIS — I10 Essential (primary) hypertension: Secondary | ICD-10-CM | POA: Diagnosis not present

## 2017-08-08 DIAGNOSIS — E039 Hypothyroidism, unspecified: Secondary | ICD-10-CM

## 2017-08-08 DIAGNOSIS — E785 Hyperlipidemia, unspecified: Secondary | ICD-10-CM | POA: Diagnosis not present

## 2017-08-08 DIAGNOSIS — R634 Abnormal weight loss: Secondary | ICD-10-CM

## 2017-08-08 LAB — GLUCOSE, CAPILLARY: Glucose-Capillary: 87 mg/dL (ref 65–99)

## 2017-08-08 MED ORDER — FLUDEOXYGLUCOSE F - 18 (FDG) INJECTION
12.5200 | Freq: Once | INTRAVENOUS | Status: AC | PRN
  Administered 2017-08-08: 12.52 via INTRAVENOUS

## 2017-08-08 NOTE — Assessment & Plan Note (Signed)
Stable, continue present medications.   

## 2017-08-08 NOTE — Progress Notes (Signed)
BP 127/75   Pulse 60   Temp 97.6 F (36.4 C) (Oral)   Wt 119 lb 9.6 oz (54.3 kg)   LMP 08/08/2003 (Approximate) Comment: Hysterectomy 2004  SpO2 98%   BMI 23.36 kg/m    Subjective:    Patient ID: Janet [name], female    DOB: 08/14/1955, 62 y.o.   MRN: [id number]  HPI: Janet [name] is a 62 y.o. female  Chief Complaint  Patient presents with  . Lung Lesion    4 week f/up   Weight loss Pt is here for f/u of her weight loss.  After chest CT she was found to have lung cancer.  Staging is in process.  Since then, lost 1 more pounds.  Eating about as well as she can.  Having insurance issues with getting MRI and PET  Back pain Right upper back.  In the location of lung mass.  [facility] helps and would like a refill.    Hypertension Using medications without difficulty Average home BPs not chekcing   No problems or lightheadedness No chest pain with exertion or shortness of breath No Edema  Hyperlipidemia Using medications without problems: No Muscle aches    Social History   Socioeconomic History  . Marital status: Married    Spouse name: Not on file  . Number of children: Not on file  . Years of education: Not on file  . Highest education level: Not on file  Social Needs  . Financial resource strain: Not on file  . Food insecurity - worry: Not on file  . Food insecurity - inability: Not on file  . Transportation needs - medical: Not on file  . Transportation needs - non-medical: Not on file  Occupational History  . Not on file  Tobacco Use  . Smoking status: Former Smoker    Packs/day: 0.25    Types: Cigarettes    Last attempt to quit: 02/01/2017    Years since quitting: 0.5  . Smokeless tobacco: Never Used  Substance and Sexual Activity  . Alcohol use: No    Alcohol/week: 0.0 oz  . Drug use: No  . Sexual activity: Not Currently  Other Topics Concern  . Not on file  Social History Narrative  . Not on file   Family History  Problem Relation  Age of Onset  . Hypertension Mother   . Stroke Mother   . Leukemia Mother   . Stroke Father   . Pneumonia Father   . Diabetes Sister   . Hyperlipidemia Sister   . Breast cancer Maternal Aunt 70   Past Medical History:  Diagnosis Date  . Hyperlipidemia   . Hypertension   . Hypothyroidism   . Menopausal state   . Thyroid disease    Past Surgical History:  Procedure Laterality Date  . ABDOMINAL HYSTERECTOMY  2005  . BREAST EXCISIONAL BIOPSY Right 1979  . COLONOSCOPY WITH PROPOFOL N/A 05/13/2017   Procedure: COLONOSCOPY WITH PROPOFOL;  Surgeon: [name], MD;  Location: [facility] ENDOSCOPY;  Service: Gastroenterology;  Laterality: N/A;  . CYSTECTOMY Right    breast  . CYSTECTOMY  02/2015   back of neck  . ESOPHAGOGASTRODUODENOSCOPY (EGD) WITH PROPOFOL N/A 05/13/2017   Procedure: ESOPHAGOGASTRODUODENOSCOPY (EGD) WITH PROPOFOL;  Surgeon: [name], MD;  Location: [facility] ENDOSCOPY;  Service: Gastroenterology;  Laterality: N/A;  . TUBAL LIGATION  1986    Relevant past medical, surgical, family and social history reviewed and updated as indicated. Interim medical history since our last visit  reviewed. Allergies and medications reviewed and updated.  Review of Systems  Constitutional: Positive for unexpected weight change.  Respiratory: Negative.   Cardiovascular: Negative.   Psychiatric/Behavioral: Negative.     Per HPI unless specifically indicated above     Objective:    BP 127/75   Pulse 60   Temp 97.6 F (36.4 C) (Oral)   Wt 119 lb 9.6 oz (54.3 kg)   LMP 08/08/2003 (Approximate) Comment: Hysterectomy 2004  SpO2 98%   BMI 23.36 kg/m   Wt Readings from Last 3 Encounters:  08/08/17 119 lb 9.6 oz (54.3 kg)  08/08/17 119 lb (54 kg)  07/24/17 126 lb 9.6 oz (57.4 kg)    Physical Exam  Constitutional: She is oriented to person, place, and time. She appears well-developed and well-nourished. No distress.  HENT:  Head: Normocephalic and atraumatic.  Eyes: Conjunctivae and  lids are normal. Right eye exhibits no discharge. Left eye exhibits no discharge. No scleral icterus.  Neck: Normal range of motion. Neck supple. No JVD present. Carotid bruit is not present.  Cardiovascular: Normal rate, regular rhythm and normal heart sounds.  Pulmonary/Chest: Effort normal and breath sounds normal.  Abdominal: Normal appearance. There is no splenomegaly or hepatomegaly.  Musculoskeletal: Normal range of motion.  Neurological: She is alert and oriented to person, place, and time.  Skin: Skin is warm, dry and intact. No rash noted. No pallor.  Psychiatric: She has a normal mood and affect. Her behavior is normal. Judgment and thought content normal.    Results for orders placed or performed during the hospital encounter of 08/08/17  Glucose, capillary  Result Value Ref Range   Glucose-Capillary 87 65 - 99 mg/dL      Assessment & Plan:   Problem List Items Addressed This Visit      Unprioritized   Abnormal weight loss - Primary   Hyperlipidemia    Stable, continue present medications.        Hypertension    Stable, continue present medications.        Hypothyroidism    Last TSH a little elevated.  Reluctant to increase dose due to weight loss.  Will monitor q 6 months      Mass of upper lobe of right lung    Staging ongoing for lung cancer          Follow up plan: Return in about 5 months (around 01/06/2018).

## 2017-08-08 NOTE — Patient Instructions (Addendum)
Will set you up for a biopsy of a lymph node. If this is not possible or inconclusive we will proceed with bronchoscopy.

## 2017-08-08 NOTE — Assessment & Plan Note (Signed)
Last TSH a little elevated.  Reluctant to increase dose due to weight loss.  Will monitor q 6 months

## 2017-08-08 NOTE — Assessment & Plan Note (Signed)
Staging ongoing for lung cancer

## 2017-08-11 ENCOUNTER — Other Ambulatory Visit: Payer: Self-pay | Admitting: Internal Medicine

## 2017-08-11 ENCOUNTER — Telehealth: Payer: Self-pay | Admitting: Internal Medicine

## 2017-08-11 NOTE — Telephone Encounter (Signed)
error 

## 2017-08-18 ENCOUNTER — Other Ambulatory Visit: Payer: Self-pay | Admitting: Radiology

## 2017-08-18 ENCOUNTER — Other Ambulatory Visit: Payer: Self-pay | Admitting: Physician Assistant

## 2017-08-18 ENCOUNTER — Telehealth: Payer: Self-pay | Admitting: *Deleted

## 2017-08-18 NOTE — Telephone Encounter (Signed)
Pt called in this morning to request to move appt on 1/21 to later in the afternoon due to her work schedule. appts with Dr. Grayland Ormond and Dr. Baruch Gouty have been moved to the afternoon. Pt aware of new appt time on 1/21.   Pt reminded of biopsy scheduled for tomorrow. Instructions reviewed with patient to remain NPO after midnight and that would need driver to take her home. Pt given contact info to discuss further instructions with Juanita. Nothing further needed at this time.

## 2017-08-19 ENCOUNTER — Other Ambulatory Visit: Payer: Self-pay | Admitting: Student

## 2017-08-19 ENCOUNTER — Ambulatory Visit
Admission: RE | Admit: 2017-08-19 | Discharge: 2017-08-19 | Disposition: A | Discharge status: Home / Self Care | Payer: Managed Care, Other (non HMO) | Source: Ambulatory Visit | Attending: Internal Medicine | Admitting: Internal Medicine

## 2017-08-19 DIAGNOSIS — C3490 Malignant neoplasm of unspecified part of unspecified bronchus or lung: Secondary | ICD-10-CM | POA: Diagnosis not present

## 2017-08-19 DIAGNOSIS — Z79890 Hormone replacement therapy (postmenopausal): Secondary | ICD-10-CM | POA: Diagnosis not present

## 2017-08-19 DIAGNOSIS — R918 Other nonspecific abnormal finding of lung field: Secondary | ICD-10-CM | POA: Diagnosis not present

## 2017-08-19 DIAGNOSIS — E785 Hyperlipidemia, unspecified: Secondary | ICD-10-CM | POA: Insufficient documentation

## 2017-08-19 DIAGNOSIS — I10 Essential (primary) hypertension: Secondary | ICD-10-CM | POA: Diagnosis not present

## 2017-08-19 DIAGNOSIS — R590 Localized enlarged lymph nodes: Secondary | ICD-10-CM | POA: Insufficient documentation

## 2017-08-19 DIAGNOSIS — E039 Hypothyroidism, unspecified: Secondary | ICD-10-CM | POA: Diagnosis not present

## 2017-08-19 DIAGNOSIS — Z87891 Personal history of nicotine dependence: Secondary | ICD-10-CM | POA: Insufficient documentation

## 2017-08-19 DIAGNOSIS — Z79899 Other long term (current) drug therapy: Secondary | ICD-10-CM | POA: Diagnosis not present

## 2017-08-19 DIAGNOSIS — C770 Secondary and unspecified malignant neoplasm of lymph nodes of head, face and neck: Secondary | ICD-10-CM | POA: Insufficient documentation

## 2017-08-19 LAB — CBC
HCT: 37.5 % (ref 35.0–47.0)
Hemoglobin: 12.6 g/dL (ref 12.0–16.0)
MCH: 31.4 pg (ref 26.0–34.0)
MCHC: 33.6 g/dL (ref 32.0–36.0)
MCV: 93.3 fL (ref 80.0–100.0)
PLATELETS: 384 10*3/uL (ref 150–440)
RBC: 4.02 MIL/uL (ref 3.80–5.20)
RDW: 13.7 % (ref 11.5–14.5)
WBC: 4.8 10*3/uL (ref 3.6–11.0)

## 2017-08-19 LAB — APTT: APTT: 28 s (ref 24–36)

## 2017-08-19 LAB — PROTIME-INR
INR: 1.02
PROTHROMBIN TIME: 13.3 s (ref 11.4–15.2)

## 2017-08-19 MED ORDER — FENTANYL CITRATE (PF) 100 MCG/2ML IJ SOLN
INTRAMUSCULAR | Status: AC
  Filled 2017-08-19: qty 2

## 2017-08-19 MED ORDER — MIDAZOLAM HCL 2 MG/2ML IJ SOLN
INTRAMUSCULAR | Status: AC
  Filled 2017-08-19: qty 2

## 2017-08-19 MED ORDER — MIDAZOLAM HCL 2 MG/2ML IJ SOLN
INTRAMUSCULAR | Status: AC | PRN
  Administered 2017-08-19: 1 mg via INTRAVENOUS

## 2017-08-19 MED ORDER — SODIUM CHLORIDE 0.9 % IV SOLN
INTRAVENOUS | Status: DC
  Administered 2017-08-19: 13:00:00 via INTRAVENOUS

## 2017-08-19 MED ORDER — FENTANYL CITRATE (PF) 100 MCG/2ML IJ SOLN
INTRAMUSCULAR | Status: AC | PRN
  Administered 2017-08-19: 50 ug via INTRAVENOUS

## 2017-08-19 NOTE — Procedures (Signed)
  Procedure: [place] core R supraclav LAN 18g x4 EBL:   minimal Complications:  none immediate  See full dictation in [facility].  Dillard Cannon MD Main # [phone] Pager  [phone]

## 2017-08-19 NOTE — H&P (Signed)
Chief Complaint: Right upper lobe lung mass  Referring Physician(s): Ramachandran,Pradeep  Supervising Physician: Arne Cleveland  Patient Status: [facility] - Out-pt  History of Present Illness: Janet [name] is a 62 y.o. female  who was seen by her PCP in August of 2018 due to weight loss. She has lost about 30 pounds.  She was having symptoms of acid reflux.   She was also sent for a CXR which showed a lung nodule.  CT chest 07/17/17 = 3 cm nodule noted in the right upper lobe.  Right paratracheal lymphadenopathy is noted, however there is no contrast to delineate this.    MRI of the head on 12/31 and CT abdomen pelvis on 12/13 were negative for metastatic disease.  PET scan 08/08/17 = 1. Hypermetabolic 3 cm right upper lobe lung mass consistent with primary lung neoplasm. Fairly extensive right hilar and ipsilateral and contralateral mediastinal lymphadenopathy. 2. Small but hypermetabolic right supraclavicular consistent with metastatic disease. 3. No findings for abdominal/pelvic metastatic disease or osseous metastatic disease.  We are asked to perform an image guided biopsy of the lymph node.   Past Medical History:  Diagnosis Date  . Hyperlipidemia   . Hypertension   . Hypothyroidism   . Menopausal state   . Thyroid disease     Past Surgical History:  Procedure Laterality Date  . ABDOMINAL HYSTERECTOMY  2005  . BREAST EXCISIONAL BIOPSY Right 1979  . COLONOSCOPY WITH PROPOFOL N/A 05/13/2017   Procedure: COLONOSCOPY WITH PROPOFOL;  Surgeon: [name], MD;  Location: [facility] ENDOSCOPY;  Service: Gastroenterology;  Laterality: N/A;  . CYSTECTOMY Right    breast  . CYSTECTOMY  02/2015   back of neck  . ESOPHAGOGASTRODUODENOSCOPY (EGD) WITH PROPOFOL N/A 05/13/2017   Procedure: ESOPHAGOGASTRODUODENOSCOPY (EGD) WITH PROPOFOL;  Surgeon: [name], MD;  Location: [facility] ENDOSCOPY;  Service: Gastroenterology;  Laterality: N/A;  . TUBAL LIGATION  1986     Allergies: Patient has no known allergies.  Medications: Prior to Admission medications   Medication Sig Start Date End Date Taking? Authorizing Provider  amLODipine (NORVASC) 5 MG tablet TAKE 1 TABLET DAILY 05/08/17   Kathrine Haddock, NP  atorvastatin (LIPITOR) 10 MG tablet TAKE 1 TABLET DAILY AT 6 P.M. 05/13/17   Kathrine Haddock, NP  levothyroxine (SYNTHROID, LEVOTHROID) 50 MCG tablet Take 1 tablet (50 mcg total) by mouth daily. 12/10/16   Kathrine Haddock, NP  lisinopril (PRINIVIL,ZESTRIL) 20 MG tablet TAKE 1 TABLET DAILY 05/08/17   Kathrine Haddock, NP  omeprazole (PRILOSEC) 40 MG capsule Take 1 capsule (40 mg total) by mouth daily. 05/13/17 05/13/18  [name], MD  polyethylene glycol [facility]) packet Take 17 g by mouth daily. 05/13/17   [name], MD     Family History  Problem Relation Age of Onset  . Hypertension Mother   . Stroke Mother   . Leukemia Mother   . Stroke Father   . Pneumonia Father   . Diabetes Sister   . Hyperlipidemia Sister   . Breast cancer Maternal Aunt 70    Social History   Socioeconomic History  . Marital status: Married    Spouse name: Not on file  . Number of children: Not on file  . Years of education: Not on file  . Highest education level: Not on file  Social Needs  . Financial resource strain: Not on file  . Food insecurity - worry: Not on file  . Food insecurity - inability: Not on file  . Transportation needs - medical: Not  on file  . Transportation needs - non-medical: Not on file  Occupational History  . Not on file  Tobacco Use  . Smoking status: Former Smoker    Packs/day: 0.25    Types: Cigarettes    Last attempt to quit: 02/01/2017    Years since quitting: 0.5  . Smokeless tobacco: Never Used  Substance and Sexual Activity  . Alcohol use: No    Alcohol/week: 0.0 oz  . Drug use: No  . Sexual activity: Not Currently  Other Topics Concern  . Not on file  Social History Narrative  . Not on file    Review of Systems: A 12  point ROS discussed  Review of Systems  Constitutional: Positive for activity change and unexpected weight change.  HENT: Negative.   Respiratory: Negative.   Cardiovascular: Negative.   Gastrointestinal: Negative.        Reflux  Genitourinary: Negative.   Musculoskeletal: Negative.   Skin: Negative.   Neurological: Negative.   Hematological: Negative.   Psychiatric/Behavioral: Negative.     Vital Signs: BP 136/76   Pulse 73   Temp 98.9 F (37.2 C) (Oral)   Ht 5' (1.524 m)   Wt 120 lb (54.4 kg)   LMP 08/08/2003 (Approximate) Comment: Hysterectomy 2004  BMI 23.44 kg/m   Physical Exam  Constitutional: She is oriented to person, place, and time. She appears well-developed.  HENT:  Head: Normocephalic and atraumatic.  Eyes: EOM are normal.  Neck: Normal range of motion.  Cardiovascular: Normal rate, regular rhythm and normal heart sounds.  Pulmonary/Chest: Effort normal and breath sounds normal.  Abdominal: Soft.  Musculoskeletal: Normal range of motion.  Neurological: She is alert and oriented to person, place, and time.  Skin: Skin is warm and dry.  Psychiatric: She has a normal mood and affect. Her behavior is normal. Judgment and thought content normal.    Imaging: Mr Jeri Cos Wo Contrast  Result Date: 08/04/2017 CLINICAL DATA:  Recently diagnosed lung cancer. Evaluation for brain metastases. EXAM: MRI HEAD WITHOUT AND WITH CONTRAST TECHNIQUE: Multiplanar, multiecho pulse sequences of the brain and surrounding structures were obtained without and with intravenous contrast. CONTRAST:  10 mL MultiHance COMPARISON:  None. FINDINGS: Brain: There is no evidence of acute infarct, intracranial hemorrhage, mass, midline shift, or extra-axial fluid collection. The ventricles and sulci are normal. The supratentorial brain is normal in signal. There is a small amount of faint, non-masslike enhancement centrally in the pons without corresponding T2 signal abnormality. No abnormal  enhancement is identified elsewhere. Vascular: Major intracranial vascular flow voids are preserved. Skull and upper cervical spine: Unremarkable bone marrow signal. Sinuses/Orbits: Unremarkable orbits.  No significant sinus disease. Other: 1 cm Tornwaldt cyst. IMPRESSION: 1. No evidence of intracranial metastases. 2. 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 Electronically Signed   By: Logan Bores M.D.   On: 08/04/2017 13:51   Nm Pet Image Initial (pi) Skull Base To Thigh  Result Date: 08/08/2017 CLINICAL DATA:  Initial treatment strategy for right upper lobe lung mass. EXAM: NUCLEAR MEDICINE PET SKULL BASE TO THIGH TECHNIQUE: 12.5 mCi F-18 FDG was injected intravenously. Full-ring PET imaging was performed from the skull base to thigh after the radiotracer. CT data was obtained and used for attenuation correction and anatomic localization. FASTING BLOOD GLUCOSE:  Value: 87 mg/dl COMPARISON:  Chest CT 07/17/2017 FINDINGS: NECK: No adenopathy in the neck is identified. There is a right sided supraclavicular lymph node measuring 9 mm with SUV  max of 5.3. CHEST: The 3 cm spiculated right upper lobe lung mass is hypermetabolic with SUV max of 01.6. This is consistent with primary lung neoplasm. Fairly extensive hypermetabolic adenopathy in the mediastinum and right hilum. 14 mm right hilar lymph node and adjacent mediastinal lymph node have an SUV max of 10.4. Several other ipsilateral and contralateral mediastinal lymph nodes are hypermetabolic. No axillary adenopathy. No breast masses. Stable 3 mm right lower lobe pulmonary nodule on image number 81. Stable emphysematous changes. ABDOMEN/PELVIS: No abnormal hypermetabolic activity within the liver, pancreas, adrenal glands, or spleen. No hypermetabolic lymph nodes in the abdomen or pelvis. SKELETON: No focal hypermetabolic activity to suggest skeletal metastasis. IMPRESSION: 1. Hypermetabolic 3 cm right upper lobe  lung mass consistent with primary lung neoplasm. Fairly extensive right hilar and ipsilateral and contralateral mediastinal lymphadenopathy. 2. Small but hypermetabolic right supraclavicular consistent with metastatic disease. 3. No findings for abdominal/pelvic metastatic disease or osseous metastatic disease. Electronically Signed   By: Marijo Sanes M.D.   On: 08/08/2017 11:16    Labs:  CBC: Recent Labs    04/04/17 0946 07/09/17 1615 08/19/17 1240  WBC 5.9 5.9 4.8  HGB 11.7 10.8* 12.6  HCT 35.6 32.7* 37.5  PLT 377 394* 384    COAGS: Recent Labs    08/19/17 1240  INR 1.02  APTT 28    BMP: Recent Labs    10/07/16 0859 04/04/17 0946 07/09/17 1615  NA 143 145* 147*  K 3.9 3.2* 4.3  CL 104 102 103  CO2 26 26 29   GLUCOSE 91 98 93  BUN 8 5* 8  CALCIUM 9.1 9.1 9.6  CREATININE 0.57 0.65 0.71  GFRNONAA 100 96 92  GFRAA 116 111 106    LIVER FUNCTION TESTS: Recent Labs    10/07/16 0859 04/04/17 0946 07/09/17 1615  BILITOT 0.2 0.4 <0.2  AST 13 18 13   ALT 12 10 6   ALKPHOS 83 67 59  PROT 6.8 6.7 6.6  ALBUMIN 4.3 4.2 4.3    TUMOR MARKERS: No results for input(s): AFPTM, CEA, CA199, CHROMGRNA in the last 8760 hours.  Assessment and Plan:  Hypermetabolic 3 cm right upper lobe lung mass consistent with primary lung neoplasm. Fairly extensive right hilar and ipsilateral and contralateral mediastinal lymphadenopathy.  Small but hypermetabolic right supraclavicular lymph node consistent with metastatic disease.  Will proceed with image guided biopsy of the lymph node today by Dr. Vernard Gambles.  Risks and benefits discussed with the patient including, but not limited to bleeding, infection, damage to adjacent structures or low yield requiring additional tests. All of the patient's questions were answered, patient is agreeable to proceed. Consent signed and in chart.  Thank you for this interesting consult.  I greatly enjoyed meeting Janet [name] and look forward to  participating in their care.  A copy of this report was sent to the requesting provider on this date.  Electronically Signed: Murrell Redden, PA-C 08/19/2017, 1:08 PM   I spent a total of  30 Minutes in face to face in clinical consultation, greater than 50% of which was counseling/coordinating care for lymph node biopsy.

## 2017-08-19 NOTE — Discharge Instructions (Signed)
Needle Biopsy, Care After °Refer to this sheet in the next few weeks. These instructions provide you with information about caring for yourself after your procedure. Your health care provider may also give you more specific instructions. Your treatment has been planned according to current medical practices, but problems sometimes occur. Call your health care provider if you have any problems or questions after your procedure. °What can I expect after the procedure? °After your procedure, it is common to have soreness, bruising, or mild pain at the biopsy site. This should go away in a few days. °Follow these instructions at home: °· Rest as directed by your health care provider. °· Take medicines only as directed by your health care provider. °· There are many different ways to close and cover the biopsy site, including stitches (sutures), skin glue, and adhesive strips. Follow your health care provider's instructions about: °? Biopsy site care. °? Bandage (dressing) changes and removal. °? Biopsy site closure removal. °· Check your biopsy site every day for signs of infection. Watch for: °? Redness, swelling, or pain. °? Fluid, blood, or pus. °Contact a health care provider if: °· You have a fever. °· You have redness, swelling, or pain at the biopsy site that lasts longer than a few days. °· You have fluid, blood, or pus coming from the biopsy site. °· You feel nauseous. °· You vomit. °Get help right away if: °· You have shortness of breath. °· You have trouble breathing. °· You have chest pain. °· You feel dizzy or you faint. °· You have bleeding that does not stop with pressure or a bandage. °· You cough up blood. °· You have pain in your abdomen. °This information is not intended to replace advice given to you by your health care provider. Make sure you discuss any questions you have with your health care provider. °Document Released: 12/06/2014 Document Revised: 12/28/2015 Document Reviewed:  07/18/2014 °Elsevier Interactive Patient Education © 2018 [employer] ° °

## 2017-08-21 ENCOUNTER — Telehealth: Payer: Self-pay | Admitting: *Deleted

## 2017-08-21 LAB — SURGICAL PATHOLOGY

## 2017-08-21 NOTE — Telephone Encounter (Signed)
Call Report: U/S guided biopsy.  Dx: Metastatic adenocarcinoma.

## 2017-08-22 ENCOUNTER — Ambulatory Visit: Payer: Managed Care, Other (non HMO) | Admitting: Oncology

## 2017-08-22 LAB — ACID FAST SMEAR (AFB, MYCOBACTERIA): Acid Fast Smear: NEGATIVE

## 2017-08-22 LAB — ACID FAST SMEAR (AFB)

## 2017-08-24 NOTE — Progress Notes (Signed)
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  INTERVAL HISTORY: Patient returns to clinic today for discussion of her pathology results and treatment planning.  She had consultation with radiation oncology earlier today.  She continues to be anxious, but otherwise feels well. She has no neurologic complaints.  She denies any recent fevers or illnesses. She has a good appetite.  She denies any chest pain, shortness of breath, hemoptysis, or cough.  She has no nausea, vomiting, constipation, or diarrhea.  She has no melena or hematochezia.  Patient otherwise feels well and offers no further specific complaints today.  REVIEW OF SYSTEMS:   Review of Systems  Constitutional: Positive for weight loss. Negative for fever and malaise/fatigue.  Respiratory: Negative.  Negative for cough and shortness of breath.   Cardiovascular: Negative.  Negative for chest pain and leg swelling.  Gastrointestinal: Negative.  Negative for abdominal pain, blood in stool and melena.  Genitourinary: Negative.  Negative for dysuria.  Musculoskeletal: Negative.   Skin: Negative.  Negative for rash.  Neurological: Negative.  Negative for sensory change and weakness.  Psychiatric/Behavioral: The patient is nervous/anxious.     As per HPI. Otherwise, a complete review of systems is negative.  PAST MEDICAL HISTORY: Past Medical History:  Diagnosis Date  . Hyperlipidemia   . Hypertension   . Hypothyroidism   . Menopausal state   . Thyroid disease     PAST SURGICAL HISTORY: Past Surgical History:  Procedure Laterality Date  . ABDOMINAL HYSTERECTOMY  2005  . BREAST EXCISIONAL BIOPSY Right 1979  . COLONOSCOPY WITH PROPOFOL  N/A 05/13/2017   Procedure: COLONOSCOPY WITH PROPOFOL;  Surgeon: [name], MD;  Location: [facility] ENDOSCOPY;  Service: Gastroenterology;  Laterality: N/A;  . CYSTECTOMY Right    breast  . CYSTECTOMY  02/2015   back of neck  . ESOPHAGOGASTRODUODENOSCOPY (EGD) WITH PROPOFOL N/A 05/13/2017   Procedure: ESOPHAGOGASTRODUODENOSCOPY (EGD) WITH PROPOFOL;  Surgeon: [name], MD;  Location: [facility] ENDOSCOPY;  Service: Gastroenterology;  Laterality: N/A;  . TUBAL LIGATION  1986    FAMILY HISTORY: Family History  Problem Relation Age of Onset  . Hypertension Mother   . Stroke Mother   . Leukemia Mother   . Stroke Father   . Pneumonia Father   . Diabetes Sister   . Hyperlipidemia Sister   . Breast cancer Maternal Aunt 54    ADVANCED DIRECTIVES (Y/N):  N  HEALTH MAINTENANCE: Social History   Tobacco Use  . Smoking status: Former Smoker    Packs/day: 0.25    Types: Cigarettes    Last attempt to quit: 02/01/2017    Years since quitting: 0.5  . Smokeless tobacco: Never Used  Substance Use Topics  . Alcohol use: No    Alcohol/week: 0.0 oz  . Drug use: No     Colonoscopy:  PAP:  Bone density:  Lipid panel:  No Known Allergies  Current Outpatient Medications  Medication Sig Dispense Refill  . amLODipine (NORVASC) 5 MG tablet TAKE 1 TABLET DAILY 90 tablet 1  . atorvastatin (LIPITOR) 10 MG tablet TAKE 1 TABLET DAILY AT 6 P.M. 90 tablet 1  . levothyroxine (SYNTHROID, LEVOTHROID) 50 MCG tablet Take 1 tablet (50 mcg total) by mouth daily. 90 tablet 3  .  lidocaine-prilocaine (EMLA) cream Apply to affected area once 30 g 3  . lisinopril (PRINIVIL,ZESTRIL) 20 MG tablet TAKE 1 TABLET DAILY 90 tablet 1  . omeprazole (PRILOSEC) 40 MG capsule Take 1 capsule (40 mg total) by mouth daily. (Patient not taking: Reported on 08/19/2017) 30 capsule 1  . ondansetron (ZOFRAN) 8 MG tablet Take 1 tablet (8 mg total) by mouth 2 (two) times daily as needed for refractory nausea / vomiting. 30 tablet 2  .  polyethylene glycol (MIRALAX) packet Take 17 g by mouth daily. (Patient not taking: Reported on 08/25/2017) 14 each 0  . prochlorperazine (COMPAZINE) 10 MG tablet Take 1 tablet (10 mg total) by mouth every 6 (six) hours as needed (Nausea or vomiting). 60 tablet 2  . traMADol (ULTRAM) 50 MG tablet Take 1 tablet (50 mg total) by mouth 2 (two) times daily. 60 tablet 0   No current facility-administered medications for this visit.     OBJECTIVE: There were no vitals filed for this visit.   There is no height or weight on file to calculate BMI.    ECOG FS:0 - Asymptomatic  General: Well-developed, well-nourished, no acute distress. Eyes: Pink conjunctiva, anicteric sclera. Lungs: Clear to auscultation bilaterally. Heart: Regular rate and rhythm. No rubs, murmurs, or gallops. Abdomen: Soft, nontender, nondistended. No organomegaly noted, normoactive bowel sounds. Musculoskeletal: No edema, cyanosis, or clubbing. Neuro: Alert, answering all questions appropriately. Cranial nerves grossly intact. Skin: No rashes or petechiae noted. Psych: Normal affect.   LAB RESULTS:  Lab Results  Component Value Date   NA 147 (H) 07/09/2017   K 4.3 07/09/2017   CL 103 07/09/2017   CO2 29 07/09/2017   GLUCOSE 93 07/09/2017   BUN 8 07/09/2017   CREATININE 0.71 07/09/2017   CALCIUM 9.6 07/09/2017   PROT 6.6 07/09/2017   ALBUMIN 4.3 07/09/2017   AST 13 07/09/2017   ALT 6 07/09/2017   ALKPHOS 59 07/09/2017   BILITOT <0.2 07/09/2017   GFRNONAA 92 07/09/2017   GFRAA 106 07/09/2017    Lab Results  Component Value Date   WBC 4.8 08/19/2017   NEUTROABS 2.5 07/09/2017   HGB 12.6 08/19/2017   HCT 37.5 08/19/2017   MCV 93.3 08/19/2017   PLT 384 08/19/2017     STUDIES: Mr Janet [name] QQ Contrast  Result Date: 08/04/2017 CLINICAL DATA:  Recently diagnosed lung cancer. Evaluation for brain metastases. EXAM: MRI HEAD WITHOUT AND WITH CONTRAST TECHNIQUE: Multiplanar, multiecho pulse sequences of the  brain and surrounding structures were obtained without and with intravenous contrast. CONTRAST:  10 mL MultiHance COMPARISON:  None. FINDINGS: Brain: There is no evidence of acute infarct, intracranial hemorrhage, mass, midline shift, or extra-axial fluid collection. The ventricles and sulci are normal. The supratentorial brain is normal in signal. There is a small amount of faint, non-masslike enhancement centrally in the pons without corresponding T2 signal abnormality. No abnormal enhancement is identified elsewhere. Vascular: Major intracranial vascular flow voids are preserved. Skull and upper cervical spine: Unremarkable bone marrow signal. Sinuses/Orbits: Unremarkable orbits.  No significant sinus disease. Other: 1 cm Tornwaldt cyst. IMPRESSION: 1. No evidence of intracranial metastases. 2. 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 Electronically Signed   By: Logan Bores M.D.   On: 08/04/2017 13:51   Nm Pet Image Initial (pi) Skull Base To Thigh  Result Date: 08/08/2017 CLINICAL DATA:  Initial treatment strategy for right upper lobe lung mass. EXAM: NUCLEAR MEDICINE PET SKULL BASE TO THIGH TECHNIQUE: 12.5  mCi F-18 FDG was injected intravenously. Full-ring PET imaging was performed from the skull base to thigh after the radiotracer. CT data was obtained and used for attenuation correction and anatomic localization. FASTING BLOOD GLUCOSE:  Value: 87 mg/dl COMPARISON:  Chest CT 07/17/2017 FINDINGS: NECK: No adenopathy in the neck is identified. There is a right sided supraclavicular lymph node measuring 9 mm with SUV max of 5.3. CHEST: The 3 cm spiculated right upper lobe lung mass is hypermetabolic with SUV max of 13.2. This is consistent with primary lung neoplasm. Fairly extensive hypermetabolic adenopathy in the mediastinum and right hilum. 14 mm right hilar lymph node and adjacent mediastinal lymph node have an SUV max of 10.4. Several other ipsilateral  and contralateral mediastinal lymph nodes are hypermetabolic. No axillary adenopathy. No breast masses. Stable 3 mm right lower lobe pulmonary nodule on image number 81. Stable emphysematous changes. ABDOMEN/PELVIS: No abnormal hypermetabolic activity within the liver, pancreas, adrenal glands, or spleen. No hypermetabolic lymph nodes in the abdomen or pelvis. SKELETON: No focal hypermetabolic activity to suggest skeletal metastasis. IMPRESSION: 1. Hypermetabolic 3 cm right upper lobe lung mass consistent with primary lung neoplasm. Fairly extensive right hilar and ipsilateral and contralateral mediastinal lymphadenopathy. 2. Small but hypermetabolic right supraclavicular consistent with metastatic disease. 3. No findings for abdominal/pelvic metastatic disease or osseous metastatic disease. Electronically Signed   By: Marijo Sanes M.D.   On: 08/08/2017 11:16   [place] Core Biopsy (lymph Nodes)  Result Date: 08/19/2017 CLINICAL DATA:  Hypermetabolic 3 cm right upper lobe lung mass consistent with primary lung neoplasm. Fairly extensive right hilar and ipsilateral and contralateral mediastinal lymphadenopathy. Small but hypermetabolic right supraclavicular adenopathy consistent with metastatic disease.  Biopsy requested. EXAM: ULTRASOUND GUIDED CORE BIOPSY OF RIGHT SUPRACLAVICULAR ADENOPATHY MEDICATIONS: Intravenous Fentanyl and Versed were administered as conscious sedation during continuous monitoring of the patient's level of consciousness and physiological / cardiorespiratory status by the radiology RN, with a total moderate sedation time of 13 minutes. PROCEDURE: The procedure, risks, benefits, and alternatives were explained to the patient. Questions regarding the procedure were encouraged and answered. The patient understands and consents to the procedure. Survey ultrasound of the right supraclavicular region was performed and enlarged heterogeneous adenopathy localized. An appropriate skin entry site was  determined and marked. The operative field was prepped with chlorhexidine in a sterile fashion, and a sterile drape was applied covering the operative field. A sterile gown and sterile gloves were used for the procedure. Local anesthesia was provided with 1% Lidocaine. Under real-time ultrasound guidance, a 17 gauge trocar needle was advanced to the margin of the lesion. Once needle tip position was confirmed, coaxial 18-gauge core biopsy samples were obtained, submitted in saline to surgical pathology. The guide needle was removed. Postprocedure scans show no hemorrhage or other apparent complication. The patient tolerated the procedure well. COMPLICATIONS: None. FINDINGS: Right supraclavicular pathologic appearing adenopathy was localized. Representative core biopsy samples obtained as above. IMPRESSION: 1. Technically successful ultrasound-guided core biopsy, right supraclavicular adenopathy Electronically Signed   By: Lucrezia Europe M.D.   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  PLAN:    1. 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  Patient had consultation with radiation oncology earlier today and will proceed with concurrent chemotherapy using carboplatinum and Taxol weekly along with daily XRT.  Given the stage of disease, she will also  benefit from maintenance immunotherapy with Imfinzi every 2 weeks for 12 months.  Patient require port placement prior to initiating treatment.  Return to clinic on September 09, 2017 to initiate cycle 1 of weekly carboplatinum and Taxol.   Approximately 30 minutes was spent in discussion of which greater than 50% was consultation.  Patient expressed understanding and was in agreement with this plan. She also understands that She can call clinic at any time with any questions, concerns, or complaints.   Cancer  Staging Cancer of upper lobe of right lung [facility]) Staging form: Lung, AJCC 8th Edition - Clinical stage from 08/24/2017: Stage IIIB (cT2a, cN3, cM0) - Signed by Lloyd Huger, MD on 08/24/2017   Lloyd Huger, MD   08/25/2017 5:04 PM

## 2017-08-25 ENCOUNTER — Other Ambulatory Visit: Payer: Self-pay

## 2017-08-25 ENCOUNTER — Encounter: Payer: Self-pay | Admitting: Radiation Oncology

## 2017-08-25 ENCOUNTER — Inpatient Hospital Stay: Payer: Managed Care, Other (non HMO) | Attending: Oncology | Admitting: Oncology

## 2017-08-25 ENCOUNTER — Ambulatory Visit
Admission: RE | Admit: 2017-08-25 | Discharge: 2017-08-25 | Disposition: A | Discharge status: Home / Self Care | Payer: Managed Care, Other (non HMO) | Source: Ambulatory Visit | Attending: Radiation Oncology | Admitting: Radiation Oncology

## 2017-08-25 ENCOUNTER — Ambulatory Visit: Payer: Managed Care, Other (non HMO) | Admitting: Oncology

## 2017-08-25 VITALS — BP 155/83 | HR 81 | Temp 96.7°F | Resp 20 | Wt 117.9 lb

## 2017-08-25 DIAGNOSIS — Z7189 Other specified counseling: Secondary | ICD-10-CM | POA: Insufficient documentation

## 2017-08-25 DIAGNOSIS — E039 Hypothyroidism, unspecified: Secondary | ICD-10-CM | POA: Insufficient documentation

## 2017-08-25 DIAGNOSIS — R590 Localized enlarged lymph nodes: Secondary | ICD-10-CM

## 2017-08-25 DIAGNOSIS — C3411 Malignant neoplasm of upper lobe, right bronchus or lung: Secondary | ICD-10-CM | POA: Insufficient documentation

## 2017-08-25 DIAGNOSIS — Z803 Family history of malignant neoplasm of breast: Secondary | ICD-10-CM | POA: Insufficient documentation

## 2017-08-25 DIAGNOSIS — R599 Enlarged lymph nodes, unspecified: Secondary | ICD-10-CM | POA: Insufficient documentation

## 2017-08-25 DIAGNOSIS — R634 Abnormal weight loss: Secondary | ICD-10-CM | POA: Diagnosis not present

## 2017-08-25 DIAGNOSIS — Z9049 Acquired absence of other specified parts of digestive tract: Secondary | ICD-10-CM | POA: Diagnosis not present

## 2017-08-25 DIAGNOSIS — M25511 Pain in right shoulder: Secondary | ICD-10-CM | POA: Diagnosis not present

## 2017-08-25 DIAGNOSIS — Z510 Encounter for antineoplastic radiation therapy: Secondary | ICD-10-CM | POA: Diagnosis not present

## 2017-08-25 DIAGNOSIS — Z87891 Personal history of nicotine dependence: Secondary | ICD-10-CM | POA: Insufficient documentation

## 2017-08-25 DIAGNOSIS — Z79899 Other long term (current) drug therapy: Secondary | ICD-10-CM | POA: Insufficient documentation

## 2017-08-25 DIAGNOSIS — F419 Anxiety disorder, unspecified: Secondary | ICD-10-CM | POA: Insufficient documentation

## 2017-08-25 DIAGNOSIS — E785 Hyperlipidemia, unspecified: Secondary | ICD-10-CM | POA: Diagnosis not present

## 2017-08-25 DIAGNOSIS — I10 Essential (primary) hypertension: Secondary | ICD-10-CM | POA: Insufficient documentation

## 2017-08-25 DIAGNOSIS — Z806 Family history of leukemia: Secondary | ICD-10-CM | POA: Insufficient documentation

## 2017-08-25 MED ORDER — LIDOCAINE-PRILOCAINE 2.5-2.5 % EX CREA: TOPICAL_CREAM | CUTANEOUS | 3 refills | Status: DC

## 2017-08-25 MED ORDER — PROCHLORPERAZINE MALEATE 10 MG PO TABS: 10.0000 mg | ORAL_TABLET | Freq: Four times a day (QID) | ORAL | 2 refills | Status: DC | PRN

## 2017-08-25 MED ORDER — TRAMADOL HCL 50 MG PO TABS: 50.0000 mg | ORAL_TABLET | Freq: Two times a day (BID) | ORAL | 0 refills | Status: DC

## 2017-08-25 MED ORDER — ONDANSETRON HCL 8 MG PO TABS: 8.0000 mg | ORAL_TABLET | Freq: Two times a day (BID) | ORAL | 2 refills | Status: DC | PRN

## 2017-08-25 NOTE — Progress Notes (Signed)
START ON PATHWAY REGIMEN - Non-Small Cell Lung     Administer weekly:     Paclitaxel      Carboplatin   **Always confirm dose/schedule in your pharmacy ordering system**    Patient Characteristics: Stage III - Unresectable, PS = 0, 1 AJCC T Category: T2a Current Disease Status: No Distant Mets or Local Recurrence AJCC N Category: N3 AJCC M Category: M0 AJCC 8 Stage Grouping: IIIB Performance Status: PS = 0, 1 Intent of Therapy: Curative Intent, Discussed with Patient

## 2017-08-25 NOTE — Progress Notes (Signed)
Patient here today for follow up regarding lung cancer, also seen by Dr. Baruch Gouty today.

## 2017-08-25 NOTE — Consult Note (Signed)
NEW PATIENT EVALUATION  Name: Janet [name]  MRN: [id number]  Date:   08/25/2017     DOB: January 10, 1956   This 62 y.o. female patient presents to the clinic for initial evaluation of stage IIIB (. T3 N3 M0) adenocarcinoma of the right upper lobe  REFERRING PHYSICIAN: Kathrine Haddock, NP  CHIEF COMPLAINT:  Chief Complaint  Patient presents with  . Lung Cancer    Pt is here for initial consultation of lung cancer.      DIAGNOSIS: The encounter diagnosis was Cancer of upper lobe of right lung ([place]).   PREVIOUS INVESTIGATIONS:  PET CT and CT scans as well as MRI of brain reviewed Pathology report reviewed Clinical notes reviewed  HPI: Patient is a 62 year old female who presented with right shoulder pain weight loss and was found on CT scan to have a right upper lobe mass concerning for malignancy. PET CT scan demonstrated a 3 cm hypermetabolic right upper lobe lesion consistent with primary lung cancer. She also extensive hypermetabolic right hilar ipsilateral and contralateral mediastinal adenopathy as well as hypermetabolic right supraclavicular lymph node. She underwent core biopsy of the right supraclavicular lymph node which was positive for metastatic adenocarcinoma consistent with lung origin. She is seen medical oncology today for consideration of concurrent chemoradiation. She is recently had her teeth extracted and is having difficulty with chewing. She is having no dysphagia at this time. She also specifically denies cough or hemoptysis. Brain MRI showed no evidence of intracranial lesions.  PLANNED TREATMENT REGIMEN: Concurrent chemoradiation with radiation given in a split course fashion  PAST MEDICAL HISTORY:  has a past medical history of Hyperlipidemia, Hypertension, Hypothyroidism, Menopausal state, and Thyroid disease.    PAST SURGICAL HISTORY:  Past Surgical History:  Procedure Laterality Date  . ABDOMINAL HYSTERECTOMY  2005  . BREAST EXCISIONAL BIOPSY Right 1979   . COLONOSCOPY WITH PROPOFOL N/A 05/13/2017   Procedure: COLONOSCOPY WITH PROPOFOL;  Surgeon: [name], MD;  Location[facility] ENDOSCOPY;  Service: Gastroenterology;  Laterality: N/A;  . CYSTECTOMY Right    breast  . CYSTECTOMY  02/2015   back of neck  . ESOPHAGOGASTRODUODENOSCOPY (EGD) WITH PROPOFOL N/A 05/13/2017   Procedure: ESOPHAGOGASTRODUODENOSCOPY (EGD) WITH PROPOFOL;  Surgeon: [name], MD;  Location: [facility] ENDOSCOPY;  Service: Gastroenterology;  Laterality: N/A;  . TUBAL LIGATION  1986    FAMILY HISTORY: family history includes Breast cancer (age of onset: 58) in her maternal aunt; Diabetes in her sister; Hyperlipidemia in her sister; Hypertension in her mother; Leukemia in her mother; Pneumonia in her father; Stroke in her father and mother.  SOCIAL HISTORY:  reports that she quit smoking about 6 months ago. Her smoking use included cigarettes. She smoked 0.25 packs per day. she has never used smokeless tobacco. She reports that she does not drink alcohol or use drugs.  ALLERGIES: Patient has no known allergies.  MEDICATIONS:  Current Outpatient Medications  Medication Sig Dispense Refill  . amLODipine (NORVASC) 5 MG tablet TAKE 1 TABLET DAILY 90 tablet 1  . atorvastatin (LIPITOR) 10 MG tablet TAKE 1 TABLET DAILY AT 6 P.M. 90 tablet 1  . levothyroxine (SYNTHROID, LEVOTHROID) 50 MCG tablet Take 1 tablet (50 mcg total) by mouth daily. 90 tablet 3  . lisinopril (PRINIVIL,ZESTRIL) 20 MG tablet TAKE 1 TABLET DAILY 90 tablet 1  . omeprazole (PRILOSEC) 40 MG capsule Take 1 capsule (40 mg total) by mouth daily. (Patient not taking: Reported on 08/19/2017) 30 capsule 1  . polyethylene glycol (MIRALAX) packet Take 17  g by mouth daily. (Patient not taking: Reported on 08/25/2017) 14 each 0   No current facility-administered medications for this encounter.     ECOG PERFORMANCE STATUS:  1 - Symptomatic but completely ambulatory  REVIEW OF SYSTEMS: Except for the right shoulder pain weight  loss Patient denies any weight loss, fatigue, weakness, fever, chills or night sweats. Patient denies any loss of vision, blurred vision. Patient denies any ringing  of the ears or hearing loss. No irregular heartbeat. Patient denies heart murmur or history of fainting. Patient denies any chest pain or pain radiating to her upper extremities. Patient denies any shortness of breath, difficulty breathing at night, cough or hemoptysis. Patient denies any swelling in the lower legs. Patient denies any nausea vomiting, vomiting of blood, or coffee ground material in the vomitus. Patient denies any stomach pain. Patient states has had normal bowel movements no significant constipation or diarrhea. Patient denies any dysuria, hematuria or significant nocturia. Patient denies any problems walking, swelling in the joints or loss of balance. Patient denies any skin changes, loss of hair or loss of weight. Patient denies any excessive worrying or anxiety or significant depression. Patient denies any problems with insomnia. Patient denies excessive thirst, polyuria, polydipsia. Patient denies any swollen glands, patient denies easy bruising or easy bleeding. Patient denies any recent infections, allergies or URI. Patient "s visual fields have not changed significantly in recent time.    PHYSICAL EXAM: BP (!) 155/83   Pulse 81   Temp (!) 96.7 F (35.9 C)   Resp 20   Wt 117 lb 15.1 oz (53.5 kg)   LMP 08/08/2003 (Approximate) Comment: Hysterectomy 2004  BMI 23.03 kg/m patient does have prominent right supraclavicular palpable adenopathy Well-developed well-nourished patient in NAD. HEENT reveals PERLA, EOMI, discs not visualized.  Oral cavity is clear. No oral mucosal lesions are identified. Neck is clear without evidence of cervical or supraclavicular adenopathy. Lungs are clear to A&P. Cardiac examination is essentially unremarkable with regular rate and rhythm without murmur rub or thrill. Abdomen is benign with  no organomegaly or masses noted. Motor sensory and DTR levels are equal and symmetric in the upper and lower extremities. Cranial nerves II through XII are grossly intact. Proprioception is intact. No peripheral adenopathy or edema is identified. No motor or sensory levels are noted. Crude visual fields are within normal range.  LABORATORY DATA: Pathology reports reviewed    RADIOLOGY RESULTS: CT scan PET CT scan and MRI of brain all reviewed and compatible with the above-stated findings   IMPRESSION: Stage IIIB adenocarcinoma of the right upper lobe with right supraclavicular and mediastinal adenopathy in 62 year old female  PLAN: At this time I to go ahead with split course radiation therapy with concurrent chemotherapy. I would plan on delivering 4000 cGy over 4 weeks in evaluating after 1 week break for response. This will be a difficult lesion to treat since its bilateral adenopathy must be radiated putting the esophagus at risk of esophagitis. Risks and benefits of treatment including possible dysphasia fatigue alteration of blood counts skin reaction all were discussed in detail with the patient. Nurse navigator was present.There will be extra effort by both professional staff as well as technical staff to coordinate and manage concurrent chemoradiation and ensuing side effects during her treatments. I personally ordered and scheduled CT simulation for later this week. Would like to eventually go up to close to 6600 cGy with a boost field. I believe treating with split course fashion will allow [place] to  treat aggressively with minimal side effects.  I would like to take this opportunity to thank you for allowing me to participate in the care of your patient.Noreene Filbert, MD

## 2017-08-26 ENCOUNTER — Telehealth (INDEPENDENT_AMBULATORY_CARE_PROVIDER_SITE_OTHER): Payer: Self-pay

## 2017-08-26 ENCOUNTER — Encounter: Payer: Self-pay | Admitting: *Deleted

## 2017-08-26 NOTE — Telephone Encounter (Signed)
Attempted to contact the patient to schedule her port placement. I had to leave a message for a return call.

## 2017-08-26 NOTE — Progress Notes (Signed)
  Oncology Nurse Navigator Documentation  Navigator Location: [facility] Onc (08/26/17 0900)   )Navigator Encounter Type: Diagnostic Results;Follow-up Appt;Initial RadOnc (08/26/17 0900)     Confirmed Diagnosis Date: 08/21/17 (08/26/17 0900)               Patient Visit Type: MedOnc;RadOnc (08/26/17 0900) Treatment Phase: Pre-Tx/Tx Discussion (08/26/17 0900) Barriers/Navigation Needs: Education;Coordination of Care (08/26/17 0900) Education: Understanding Cancer/ Treatment Options;Newly Diagnosed Cancer Education;Preparing for Upcoming Surgery/ Treatment (08/26/17 0900) Interventions: Coordination of Care;Education (08/26/17 0900)   Coordination of Care: Appts;Chemo (08/26/17 0900) Education Method: Written;Verbal (08/26/17 0900)         met with patient during follow up visit on 08/25/17 to discuss biopsy results and treatment planning. All questions answered at the time of visit. Pt voiced concerns about work schedule and pursuing treatment. Reassured pt that we are here to assist her with FMLA paperwork/disability if needed. Encouraged pt to discuss circumstances with employer and to notify us if needs any further assistance. Pt given materials regarding diagnosis as well as information regarding supportive services available. Reviewed upcoming appts with patient. Pt stated that will discuss everything with her employer on 08/26/17 and that will call if needs further assistance. Pt verbalized understanding of all instructions. Nothing further needed at this time.       Time Spent with Patient: 90 (08/26/17 0900)

## 2017-08-27 ENCOUNTER — Ambulatory Visit
Admission: RE | Admit: 2017-08-27 | Discharge: 2017-08-27 | Disposition: A | Discharge status: Home / Self Care | Payer: Managed Care, Other (non HMO) | Source: Ambulatory Visit | Attending: Radiation Oncology | Admitting: Radiation Oncology

## 2017-08-27 ENCOUNTER — Encounter (INDEPENDENT_AMBULATORY_CARE_PROVIDER_SITE_OTHER): Payer: Self-pay

## 2017-08-27 DIAGNOSIS — C3411 Malignant neoplasm of upper lobe, right bronchus or lung: Secondary | ICD-10-CM | POA: Diagnosis not present

## 2017-08-28 ENCOUNTER — Telehealth (INDEPENDENT_AMBULATORY_CARE_PROVIDER_SITE_OTHER): Payer: Self-pay

## 2017-08-28 ENCOUNTER — Other Ambulatory Visit (INDEPENDENT_AMBULATORY_CARE_PROVIDER_SITE_OTHER): Payer: Self-pay | Admitting: Vascular Surgery

## 2017-08-28 NOTE — Telephone Encounter (Signed)
Patient is scheduled for port placement on 09/01/17. She initially was to arrive at 11:00, I have attempted to call and let her know her new arrival time is 12:15pm. I left a message regarding her new arrival time.

## 2017-08-31 MED ORDER — CEFAZOLIN SODIUM-DEXTROSE 2-4 GM/100ML-% IV SOLN
2.0000 g | Freq: Once | INTRAVENOUS | Status: AC
  Administered 2017-09-01: 2 g via INTRAVENOUS

## 2017-09-01 ENCOUNTER — Encounter: Payer: Self-pay | Admitting: *Deleted

## 2017-09-01 ENCOUNTER — Other Ambulatory Visit: Payer: Managed Care, Other (non HMO)

## 2017-09-01 ENCOUNTER — Encounter
Admission: RE | Disposition: A | Discharge status: Home / Self Care | Payer: Self-pay | Source: Ambulatory Visit | Attending: Vascular Surgery

## 2017-09-01 ENCOUNTER — Ambulatory Visit
Admission: RE | Admit: 2017-09-01 | Discharge: 2017-09-01 | Disposition: A | Discharge status: Home / Self Care | Payer: Managed Care, Other (non HMO) | Source: Ambulatory Visit | Attending: Vascular Surgery | Admitting: Vascular Surgery

## 2017-09-01 DIAGNOSIS — Z9889 Other specified postprocedural states: Secondary | ICD-10-CM | POA: Insufficient documentation

## 2017-09-01 DIAGNOSIS — Z8249 Family history of ischemic heart disease and other diseases of the circulatory system: Secondary | ICD-10-CM | POA: Insufficient documentation

## 2017-09-01 DIAGNOSIS — Z780 Asymptomatic menopausal state: Secondary | ICD-10-CM | POA: Diagnosis not present

## 2017-09-01 DIAGNOSIS — Z87891 Personal history of nicotine dependence: Secondary | ICD-10-CM | POA: Diagnosis not present

## 2017-09-01 DIAGNOSIS — Z803 Family history of malignant neoplasm of breast: Secondary | ICD-10-CM | POA: Diagnosis not present

## 2017-09-01 DIAGNOSIS — C3411 Malignant neoplasm of upper lobe, right bronchus or lung: Secondary | ICD-10-CM | POA: Diagnosis not present

## 2017-09-01 DIAGNOSIS — Z806 Family history of leukemia: Secondary | ICD-10-CM | POA: Diagnosis not present

## 2017-09-01 DIAGNOSIS — Z833 Family history of diabetes mellitus: Secondary | ICD-10-CM | POA: Insufficient documentation

## 2017-09-01 DIAGNOSIS — E079 Disorder of thyroid, unspecified: Secondary | ICD-10-CM | POA: Diagnosis not present

## 2017-09-01 DIAGNOSIS — Z79899 Other long term (current) drug therapy: Secondary | ICD-10-CM | POA: Insufficient documentation

## 2017-09-01 DIAGNOSIS — E785 Hyperlipidemia, unspecified: Secondary | ICD-10-CM | POA: Diagnosis not present

## 2017-09-01 DIAGNOSIS — Z90710 Acquired absence of both cervix and uterus: Secondary | ICD-10-CM | POA: Insufficient documentation

## 2017-09-01 DIAGNOSIS — C3490 Malignant neoplasm of unspecified part of unspecified bronchus or lung: Secondary | ICD-10-CM | POA: Diagnosis present

## 2017-09-01 DIAGNOSIS — I10 Essential (primary) hypertension: Secondary | ICD-10-CM | POA: Insufficient documentation

## 2017-09-01 DIAGNOSIS — Z823 Family history of stroke: Secondary | ICD-10-CM | POA: Diagnosis not present

## 2017-09-01 HISTORY — PX: PORTA CATH INSERTION: CATH118285

## 2017-09-01 HISTORY — DX: Unspecified osteoarthritis, unspecified site: M19.90

## 2017-09-01 SURGERY — PORTA CATH INSERTION
Anesthesia: Moderate Sedation

## 2017-09-01 MED ORDER — MIDAZOLAM HCL 5 MG/5ML IJ SOLN
INTRAMUSCULAR | Status: AC
  Filled 2017-09-01: qty 5

## 2017-09-01 MED ORDER — SODIUM CHLORIDE 0.9 % IV SOLN
INTRAVENOUS | Status: DC
  Administered 2017-09-01: 13:00:00 via INTRAVENOUS

## 2017-09-01 MED ORDER — SODIUM CHLORIDE 0.9 % IR SOLN
Freq: Once | Status: AC
  Administered 2017-09-01: 15:00:00
  Filled 2017-09-01: qty 2

## 2017-09-01 MED ORDER — FENTANYL CITRATE (PF) 100 MCG/2ML IJ SOLN
INTRAMUSCULAR | Status: DC | PRN
  Administered 2017-09-01 (×2): 25 ug via INTRAVENOUS
  Administered 2017-09-01: 50 ug via INTRAVENOUS

## 2017-09-01 MED ORDER — ONDANSETRON HCL 4 MG/2ML IJ SOLN: 4.0000 mg | Freq: Four times a day (QID) | INTRAMUSCULAR | Status: DC | PRN

## 2017-09-01 MED ORDER — LIDOCAINE-EPINEPHRINE (PF) 1 %-1:200000 IJ SOLN
INTRAMUSCULAR | Status: AC
  Filled 2017-09-01: qty 30

## 2017-09-01 MED ORDER — FENTANYL CITRATE (PF) 100 MCG/2ML IJ SOLN
INTRAMUSCULAR | Status: AC
  Filled 2017-09-01: qty 2

## 2017-09-01 MED ORDER — HEPARIN (PORCINE) IN NACL 2-0.9 UNIT/ML-% IJ SOLN
INTRAMUSCULAR | Status: AC
  Filled 2017-09-01: qty 500

## 2017-09-01 MED ORDER — MIDAZOLAM HCL 2 MG/2ML IJ SOLN
INTRAMUSCULAR | Status: DC | PRN
  Administered 2017-09-01 (×2): 2 mg via INTRAVENOUS
  Administered 2017-09-01: 1 mg via INTRAVENOUS

## 2017-09-01 MED ORDER — LIDOCAINE-EPINEPHRINE (PF) 1 %-1:200000 IJ SOLN
INTRAMUSCULAR | Status: DC | PRN
  Administered 2017-09-01: 10 mL via INTRADERMAL

## 2017-09-01 MED ORDER — HYDROMORPHONE HCL 1 MG/ML IJ SOLN: 1.0000 mg | Freq: Once | INTRAMUSCULAR | Status: DC | PRN

## 2017-09-01 SURGICAL SUPPLY — 10 items
CANNULA 5F STIFF (CANNULA) ×3 IMPLANT
DERMABOND ADVANCED (GAUZE/BANDAGES/DRESSINGS) ×2
DERMABOND ADVANCED .7 DNX12 (GAUZE/BANDAGES/DRESSINGS) ×1 IMPLANT
KIT PORT POWER 8FR ISP CVUE (Miscellaneous) ×3 IMPLANT
PACK ANGIOGRAPHY (CUSTOM PROCEDURE TRAY) ×3 IMPLANT
PAD GROUND ADULT SPLIT (MISCELLANEOUS) ×3 IMPLANT
PENCIL ELECTRO HAND CTR (MISCELLANEOUS) ×3 IMPLANT
SUT MNCRL AB 4-0 PS2 18 (SUTURE) ×3 IMPLANT
SUT PROLENE 0 CT 1 30 (SUTURE) ×3 IMPLANT
SUTURE VIC 3-0 (SUTURE) ×3 IMPLANT

## 2017-09-01 NOTE — Discharge Instructions (Signed)
Implanted Port Home Guide An implanted port is a type of central line that is placed under the skin. Central lines are used to provide IV access when treatment or nutrition needs to be given through a person's veins. Implanted ports are used for long-term IV access. An implanted port may be placed because:  You need IV medicine that would be irritating to the small veins in your hands or arms.  You need long-term IV medicines, such as antibiotics.  You need IV nutrition for a long period.  You need frequent blood draws for lab tests.  You need dialysis.  Implanted ports are usually placed in the chest area, but they can also be placed in the upper arm, the abdomen, or the leg. An implanted port has two main parts:  Reservoir. The reservoir is round and will appear as a small, raised area under your skin. The reservoir is the part where a needle is inserted to give medicines or draw blood.  Catheter. The catheter is a thin, flexible tube that extends from the reservoir. The catheter is placed into a large vein. Medicine that is inserted into the reservoir goes into the catheter and then into the vein.  How will I care for my incision site? Do not get the incision site wet. Bathe or shower as directed by your health care provider. How is my port accessed? Special steps must be taken to access the port:  Before the port is accessed, a numbing cream can be placed on the skin. This helps numb the skin over the port site.  Your health care provider uses a sterile technique to access the port. ? Your health care provider must put on a mask and sterile gloves. ? The skin over your port is cleaned carefully with an antiseptic and allowed to dry. ? The port is gently pinched between sterile gloves, and a needle is inserted into the port.  Only "non-coring" port needles should be used to access the port. Once the port is accessed, a blood return should be checked. This helps ensure that the port  is in the vein and is not clogged.  If your port needs to remain accessed for a constant infusion, a clear (transparent) bandage will be placed over the needle site. The bandage and needle will need to be changed every week, or as directed by your health care provider.  Keep the bandage covering the needle clean and dry. Do not get it wet. Follow your health care provider's instructions on how to take a shower or bath while the port is accessed.  If your port does not need to stay accessed, no bandage is needed over the port.  What is flushing? Flushing helps keep the port from getting clogged. Follow your health care provider's instructions on how and when to flush the port. Ports are usually flushed with saline solution or a medicine called heparin. The need for flushing will depend on how the port is used.  If the port is used for intermittent medicines or blood draws, the port will need to be flushed: ? After medicines have been given. ? After blood has been drawn. ? As part of routine maintenance.  If a constant infusion is running, the port may not need to be flushed.  How long will my port stay implanted? The port can stay in for as long as your health care provider thinks it is needed. When it is time for the port to come out, surgery will be   done to remove it. The procedure is similar to the one performed when the port was put in. When should I seek immediate medical care? When you have an implanted port, you should seek immediate medical care if:  You notice a bad smell coming from the incision site.  You have swelling, redness, or drainage at the incision site.  You have more swelling or pain at the port site or the surrounding area.  You have a fever that is not controlled with medicine.  This information is not intended to replace advice given to you by your health care provider. Make sure you discuss any questions you have with your health care provider. Document  Released: 07/22/2005 Document Revised: 12/28/2015 Document Reviewed: 03/29/2013 Elsevier Interactive Patient Education  2017 Elsevier Inc.  

## 2017-09-01 NOTE — H&P (Signed)
[facility] History & Physical Update  The patient was interviewed and re-examined.  The patient's previous History and Physical has been reviewed and is unchanged.  There is no change in the plan of care. We plan to proceed with the scheduled procedure.  Leotis Pain, MD  09/01/2017, 12:07 PM

## 2017-09-01 NOTE — Op Note (Signed)
      [facility]       Operative Note  Date: 09/01/2017  Preoperative diagnosis:  1. Lung cancer  Postoperative diagnosis:  Same as above  Procedures: #1. Ultrasound guidance for vascular access to the left internal jugular vein. #2. Fluoroscopic guidance for placement of catheter. #3. Placement of CT compatible Port-A-Cath, left internal jugular vein.  Surgeon: Leotis Pain, MD.   Anesthesia: Local with moderate conscious sedation for approximately 20  minutes using 5 mg of Versed and 100 mcg of Fentanyl  Fluoroscopy time: less than 1 minute  Contrast used: 0  Estimated blood loss: 3 cc  Indication for the procedure:  The patient is a 61 y.o.female with lung cancer.  The patient needs a Port-A-Cath for durable venous access, chemotherapy, lab draws, and CT scans. We are asked to place this. Risks and benefits were discussed and informed consent was obtained.  Description of procedure: The patient was brought to the vascular and interventional radiology suite.  Moderate conscious sedation was administered throughout the procedure during a face to face encounter with the patient with my supervision of the RN administering medicines and monitoring the patient's vital signs, pulse oximetry, telemetry and mental status throughout from the start of the procedure until the patient was taken to the recovery room. The left neck chest and shoulder were sterilely prepped and draped, and a sterile surgical field was created. Ultrasound was used to help visualize a patent left internal jugular vein. This was then accessed under direct ultrasound guidance without difficulty with the Seldinger needle and a permanent image was recorded. A J-wire was placed. After skin nick and dilatation, the peel-away sheath was then placed over the wire. I then anesthetized an area under the clavicle approximately 1-2 fingerbreadths. A transverse incision was created and an inferior pocket was  created with electrocautery and blunt dissection. The port was then brought onto the field, placed into the pocket and secured to the chest wall with 2 Prolene sutures. The catheter was connected to the port and tunneled from the subclavicular incision to the access site. Fluoroscopic guidance was then used to cut the catheter to an appropriate length. The catheter was then placed through the peel-away sheath and the peel-away sheath was removed. The catheter tip was parked in excellent location under fluorocoscopic guidance just into the atrium. The pocket was then irrigated with antibiotic impregnated saline and the wound was closed with a running 3-0 Vicryl and a 4-0 Monocryl. The access incision was closed with a single 4-0 Monocryl. The Huber needle was used to flush the port with heparinized saline. Dermabond was then placed as a dressing. The patient tolerated the procedure well and was taken to the recovery room in stable condition.   Leotis Pain 09/01/2017 2:51 PM   This note was created with Dragon Medical transcription system. Any errors in dictation are purely unintentional.

## 2017-09-02 ENCOUNTER — Encounter: Payer: Self-pay | Admitting: Vascular Surgery

## 2017-09-02 ENCOUNTER — Other Ambulatory Visit: Payer: Managed Care, Other (non HMO)

## 2017-09-02 DIAGNOSIS — C3411 Malignant neoplasm of upper lobe, right bronchus or lung: Secondary | ICD-10-CM | POA: Diagnosis not present

## 2017-09-02 NOTE — Patient Instructions (Signed)

## 2017-09-03 ENCOUNTER — Inpatient Hospital Stay: Payer: Managed Care, Other (non HMO)

## 2017-09-04 ENCOUNTER — Ambulatory Visit
Admission: RE | Admit: 2017-09-04 | Discharge: 2017-09-04 | Disposition: A | Discharge status: Home / Self Care | Payer: Managed Care, Other (non HMO) | Source: Ambulatory Visit | Attending: Radiation Oncology | Admitting: Radiation Oncology

## 2017-09-04 DIAGNOSIS — C3411 Malignant neoplasm of upper lobe, right bronchus or lung: Secondary | ICD-10-CM | POA: Diagnosis not present

## 2017-09-06 ENCOUNTER — Other Ambulatory Visit: Payer: Self-pay | Admitting: Oncology

## 2017-09-06 NOTE — Progress Notes (Signed)
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  INTERVAL HISTORY: Patient returns to clinic today for further evaluation and initiation of cycle 1 of weekly carboplatinum and Taxol.  She also initiated XRT this week.  She currently feels well and is asymptomatic. She has no neurologic complaints.  She denies any recent fevers or illnesses. She has a good appetite.  She denies any chest pain, shortness of breath, hemoptysis, or cough.  She has no nausea, vomiting, constipation, or diarrhea.  She has no melena or hematochezia.  Patient offers no specific complaints today.  REVIEW OF SYSTEMS:   Review of Systems  Constitutional: Negative.  Negative for fever, malaise/fatigue and weight loss.  Respiratory: Negative.  Negative for cough and shortness of breath.   Cardiovascular: Negative.  Negative for chest pain and leg swelling.  Gastrointestinal: Negative.  Negative for abdominal pain, blood in stool and melena.  Genitourinary: Negative.  Negative for dysuria.  Musculoskeletal: Negative.   Skin: Negative.  Negative for rash.  Neurological: Negative.  Negative for sensory change and weakness.  Psychiatric/Behavioral: The patient is nervous/anxious.     As per HPI. Otherwise, a complete review of systems is negative.  PAST MEDICAL HISTORY: Past Medical History:  Diagnosis Date  . Arthritis    right shoulder  . Cancer ([place])    right lung  . Hyperlipidemia   . Hypertension   . Hypothyroidism   . Menopausal state   . Thyroid disease     PAST SURGICAL HISTORY: Past Surgical History:  Procedure Laterality Date  . ABDOMINAL HYSTERECTOMY  2005  . BREAST EXCISIONAL BIOPSY Right 1979  . COLONOSCOPY  WITH PROPOFOL N/A 05/13/2017   Procedure: COLONOSCOPY WITH PROPOFOL;  Surgeon: [name], MD;  Location: [facility] ENDOSCOPY;  Service: Gastroenterology;  Laterality: N/A;  . CYSTECTOMY Right    breast  . CYSTECTOMY  02/2015   back of neck  . ESOPHAGOGASTRODUODENOSCOPY (EGD) WITH PROPOFOL N/A 05/13/2017   Procedure: ESOPHAGOGASTRODUODENOSCOPY (EGD) WITH PROPOFOL;  Surgeon: [name], MD;  Location: [facility] ENDOSCOPY;  Service: Gastroenterology;  Laterality: N/A;  . PORTA CATH INSERTION N/A 09/01/2017   Procedure: PORTA CATH INSERTION;  Surgeon: Algernon Huxley, MD;  Location: [place] CV LAB;  Service: Cardiovascular;  Laterality: N/A;  . TUBAL LIGATION  1986    FAMILY HISTORY: Family History  Problem Relation Age of Onset  . Hypertension Mother   . Stroke Mother   . Leukemia Mother   . Stroke Father   . Pneumonia Father   . Diabetes Sister   . Hyperlipidemia Sister   . Breast cancer Maternal Aunt 13    ADVANCED DIRECTIVES (Y/N):  N  HEALTH MAINTENANCE: Social History   Tobacco Use  . Smoking status: Former Smoker    Packs/day: 0.25    Types: Cigarettes    Last attempt to quit: 02/01/2017    Years since quitting: 0.6  . Smokeless tobacco: Never Used  Substance Use Topics  . Alcohol use: No    Alcohol/week: 0.0 oz  . Drug use: No     Colonoscopy:  PAP:  Bone density:  Lipid panel:  No Known Allergies  Current Outpatient Medications  Medication Sig Dispense Refill  . amLODipine (NORVASC) 5 MG tablet TAKE 1 TABLET  DAILY 90 tablet 1  . atorvastatin (LIPITOR) 10 MG tablet TAKE 1 TABLET DAILY AT 6 P.M. 90 tablet 1  . levothyroxine (SYNTHROID, LEVOTHROID) 50 MCG tablet Take 1 tablet (50 mcg total) by mouth daily. 90 tablet 3  . lidocaine-prilocaine (EMLA) cream Apply to affected area once 30 g 3  . lisinopril (PRINIVIL,ZESTRIL) 20 MG tablet TAKE 1 TABLET DAILY 90 tablet 1  . ondansetron (ZOFRAN) 8 MG tablet Take 1 tablet (8 mg total) by mouth 2 (two) times daily as needed  for refractory nausea / vomiting. 30 tablet 2  . prochlorperazine (COMPAZINE) 10 MG tablet Take 1 tablet (10 mg total) by mouth every 6 (six) hours as needed (Nausea or vomiting). 60 tablet 2  . traMADol (ULTRAM) 50 MG tablet Take 1 tablet (50 mg total) by mouth 2 (two) times daily. 60 tablet 0   No current facility-administered medications for this visit.     OBJECTIVE: Vitals:   09/09/17 0937  BP: 132/75  Pulse: 69  Temp: 98.1 F (36.7 C)     Body mass index is 22.52 kg/m.    ECOG FS:0 - Asymptomatic  General: Well-developed, well-nourished, no acute distress. Eyes: Pink conjunctiva, anicteric sclera. Lungs: Clear to auscultation bilaterally. Heart: Regular rate and rhythm. No rubs, murmurs, or gallops. Abdomen: Soft, nontender, nondistended. No organomegaly noted, normoactive bowel sounds. Musculoskeletal: No edema, cyanosis, or clubbing. Neuro: Alert, answering all questions appropriately. Cranial nerves grossly intact. Skin: No rashes or petechiae noted. Psych: Normal affect.   LAB RESULTS:  Lab Results  Component Value Date   NA 140 09/09/2017   K 3.8 09/09/2017   CL 104 09/09/2017   CO2 28 09/09/2017   GLUCOSE 89 09/09/2017   BUN 7 09/09/2017   CREATININE 0.52 09/09/2017   CALCIUM 9.4 09/09/2017   PROT 7.5 09/09/2017   ALBUMIN 4.0 09/09/2017   AST 50 (H) 09/09/2017   ALT 10 (L) 09/09/2017   ALKPHOS 66 09/09/2017   BILITOT 0.4 09/09/2017   GFRNONAA >60 09/09/2017   GFRAA >60 09/09/2017    Lab Results  Component Value Date   WBC 5.4 09/09/2017   NEUTROABS 3.2 09/09/2017   HGB 12.0 09/09/2017   HCT 36.1 09/09/2017   MCV 92.9 09/09/2017   PLT 319 09/09/2017     STUDIES: [place] Core Biopsy (lymph Nodes)  Result Date: 08/19/2017 CLINICAL DATA:  Hypermetabolic 3 cm right upper lobe lung mass consistent with primary lung neoplasm. Fairly extensive right hilar and ipsilateral and contralateral mediastinal lymphadenopathy. Small but hypermetabolic right  supraclavicular adenopathy consistent with metastatic disease.  Biopsy requested. EXAM: ULTRASOUND GUIDED CORE BIOPSY OF RIGHT SUPRACLAVICULAR ADENOPATHY MEDICATIONS: Intravenous Fentanyl and Versed were administered as conscious sedation during continuous monitoring of the patient's level of consciousness and physiological / cardiorespiratory status by the radiology RN, with a total moderate sedation time of 13 minutes. PROCEDURE: The procedure, risks, benefits, and alternatives were explained to the patient. Questions regarding the procedure were encouraged and answered. The patient understands and consents to the procedure. Survey ultrasound of the right supraclavicular region was performed and enlarged heterogeneous adenopathy localized. An appropriate skin entry site was determined and marked. The operative field was prepped with chlorhexidine in a sterile fashion, and a sterile drape was applied covering the operative field. A sterile gown and sterile gloves were used for the procedure. Local anesthesia was provided with 1% Lidocaine. Under real-time ultrasound guidance, a 17 gauge trocar needle was advanced to the margin of the lesion. Once needle tip position  was confirmed, coaxial 18-gauge core biopsy samples were obtained, submitted in saline to surgical pathology. The guide needle was removed. Postprocedure scans show no hemorrhage or other apparent complication. The patient tolerated the procedure well. COMPLICATIONS: None. FINDINGS: Right supraclavicular pathologic appearing adenopathy was localized. Representative core biopsy samples obtained as above. IMPRESSION: 1. Technically successful ultrasound-guided core biopsy, right supraclavicular adenopathy Electronically Signed   By: Lucrezia Europe M.D.   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  PLAN:    1. 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  Patient had consultation with radiation oncology and will proceed with concurrent chemotherapy using carboplatinum and Taxol weekly along with daily XRT.  Given the stage of disease, she will also benefit from maintenance immunotherapy with Imfinzi every 2 weeks for 12 months.  Continue daily XRT.  Proceed with cycle 1 of weekly carboplatinum and Taxol.  Return to clinic in 1 week for consideration of cycle 2.  Approximately 30 minutes was spent in discussion of which greater than 50% was consultation.  Patient expressed understanding and was in agreement with this plan. She also understands that She can call clinic at any time with any questions, concerns, or complaints.   Cancer Staging Cancer of upper lobe of right lung [facility]) Staging form: Lung, AJCC 8th Edition - Clinical stage from 08/24/2017: Stage IIIB (cT2a, cN3, cM0) - Signed by Lloyd Huger, MD on 08/24/2017   Lloyd Huger, MD   09/12/2017 3:33 PM

## 2017-09-08 ENCOUNTER — Ambulatory Visit
Admission: RE | Admit: 2017-09-08 | Discharge: 2017-09-08 | Disposition: A | Discharge status: Home / Self Care | Payer: Managed Care, Other (non HMO) | Source: Ambulatory Visit | Attending: Radiation Oncology | Admitting: Radiation Oncology

## 2017-09-08 DIAGNOSIS — C3411 Malignant neoplasm of upper lobe, right bronchus or lung: Secondary | ICD-10-CM | POA: Diagnosis not present

## 2017-09-09 ENCOUNTER — Inpatient Hospital Stay: Admission: RE | Admit: 2017-09-09 | Payer: Self-pay | Source: Ambulatory Visit

## 2017-09-09 ENCOUNTER — Encounter: Payer: Self-pay | Admitting: Oncology

## 2017-09-09 ENCOUNTER — Ambulatory Visit: Admission: RE | Admit: 2017-09-09 | Payer: Managed Care, Other (non HMO) | Source: Ambulatory Visit

## 2017-09-09 ENCOUNTER — Inpatient Hospital Stay: Payer: Managed Care, Other (non HMO)

## 2017-09-09 ENCOUNTER — Inpatient Hospital Stay: Payer: Managed Care, Other (non HMO) | Attending: Oncology

## 2017-09-09 ENCOUNTER — Other Ambulatory Visit: Payer: Self-pay

## 2017-09-09 ENCOUNTER — Encounter: Payer: Self-pay | Admitting: *Deleted

## 2017-09-09 ENCOUNTER — Telehealth: Payer: Self-pay

## 2017-09-09 ENCOUNTER — Inpatient Hospital Stay (HOSPITAL_BASED_OUTPATIENT_CLINIC_OR_DEPARTMENT_OTHER): Payer: Managed Care, Other (non HMO) | Admitting: Oncology

## 2017-09-09 VITALS — BP 104/66 | HR 66 | Temp 97.6°F | Resp 18

## 2017-09-09 VITALS — BP 132/75 | HR 69 | Temp 98.1°F | Wt 115.3 lb

## 2017-09-09 DIAGNOSIS — I10 Essential (primary) hypertension: Secondary | ICD-10-CM

## 2017-09-09 DIAGNOSIS — F419 Anxiety disorder, unspecified: Secondary | ICD-10-CM | POA: Diagnosis not present

## 2017-09-09 DIAGNOSIS — M1990 Unspecified osteoarthritis, unspecified site: Secondary | ICD-10-CM

## 2017-09-09 DIAGNOSIS — E785 Hyperlipidemia, unspecified: Secondary | ICD-10-CM | POA: Insufficient documentation

## 2017-09-09 DIAGNOSIS — Z79899 Other long term (current) drug therapy: Secondary | ICD-10-CM | POA: Insufficient documentation

## 2017-09-09 DIAGNOSIS — E039 Hypothyroidism, unspecified: Secondary | ICD-10-CM | POA: Insufficient documentation

## 2017-09-09 DIAGNOSIS — D649 Anemia, unspecified: Secondary | ICD-10-CM | POA: Diagnosis not present

## 2017-09-09 DIAGNOSIS — C3411 Malignant neoplasm of upper lobe, right bronchus or lung: Secondary | ICD-10-CM | POA: Diagnosis not present

## 2017-09-09 DIAGNOSIS — Z5111 Encounter for antineoplastic chemotherapy: Secondary | ICD-10-CM | POA: Insufficient documentation

## 2017-09-09 DIAGNOSIS — R630 Anorexia: Secondary | ICD-10-CM | POA: Diagnosis not present

## 2017-09-09 DIAGNOSIS — Z87891 Personal history of nicotine dependence: Secondary | ICD-10-CM

## 2017-09-09 DIAGNOSIS — D72819 Decreased white blood cell count, unspecified: Secondary | ICD-10-CM | POA: Diagnosis not present

## 2017-09-09 DIAGNOSIS — Z803 Family history of malignant neoplasm of breast: Secondary | ICD-10-CM | POA: Diagnosis not present

## 2017-09-09 LAB — CBC WITH DIFFERENTIAL/PLATELET
Basophils Absolute: 0.3 10*3/uL — ABNORMAL HIGH (ref 0–0.1)
Basophils Relative: 6 %
EOS ABS: 0 10*3/uL (ref 0–0.7)
EOS PCT: 1 %
HCT: 36.1 % (ref 35.0–47.0)
Hemoglobin: 12 g/dL (ref 12.0–16.0)
LYMPHS ABS: 1.5 10*3/uL (ref 1.0–3.6)
Lymphocytes Relative: 28 %
MCH: 30.8 pg (ref 26.0–34.0)
MCHC: 33.2 g/dL (ref 32.0–36.0)
MCV: 92.9 fL (ref 80.0–100.0)
MONOS PCT: 5 %
Monocytes Absolute: 0.3 10*3/uL (ref 0.2–0.9)
Neutro Abs: 3.2 10*3/uL (ref 1.4–6.5)
Neutrophils Relative %: 60 %
PLATELETS: 319 10*3/uL (ref 150–440)
RBC: 3.88 MIL/uL (ref 3.80–5.20)
RDW: 13 % (ref 11.5–14.5)
WBC: 5.4 10*3/uL (ref 3.6–11.0)

## 2017-09-09 LAB — COMPREHENSIVE METABOLIC PANEL
ALT: 10 U/L — AB (ref 14–54)
AST: 50 U/L — AB (ref 15–41)
Albumin: 4 g/dL (ref 3.5–5.0)
Alkaline Phosphatase: 66 U/L (ref 38–126)
Anion gap: 8 (ref 5–15)
BUN: 7 mg/dL (ref 6–20)
CHLORIDE: 104 mmol/L (ref 101–111)
CO2: 28 mmol/L (ref 22–32)
CREATININE: 0.52 mg/dL (ref 0.44–1.00)
Calcium: 9.4 mg/dL (ref 8.9–10.3)
GFR calc Af Amer: >60 mL/min (ref >60)
Glucose, Bld: 89 mg/dL (ref 65–99)
Potassium: 3.8 mmol/L (ref 3.5–5.1)
Sodium: 140 mmol/L (ref 135–145)
Total Bilirubin: 0.4 mg/dL (ref 0.3–1.2)
Total Protein: 7.5 g/dL (ref 6.5–8.1)

## 2017-09-09 MED ORDER — HEPARIN SOD (PORK) LOCK FLUSH 100 UNIT/ML IV SOLN
500.0000 [IU] | Freq: Once | INTRAVENOUS | Status: AC
  Administered 2017-09-09: 500 [IU] via INTRAVENOUS
  Filled 2017-09-09: qty 5

## 2017-09-09 MED ORDER — HEPARIN SOD (PORK) LOCK FLUSH 100 UNIT/ML IV SOLN: 500.0000 [IU] | Freq: Once | INTRAVENOUS | Status: DC | PRN

## 2017-09-09 MED ORDER — SODIUM CHLORIDE 0.9 % IV SOLN
173.2000 mg | Freq: Once | INTRAVENOUS | Status: AC
  Administered 2017-09-09: 170 mg via INTRAVENOUS
  Filled 2017-09-09: qty 17

## 2017-09-09 MED ORDER — SODIUM CHLORIDE 0.9 % IV SOLN: 10.0000 mg | Freq: Once | INTRAVENOUS | Status: DC

## 2017-09-09 MED ORDER — DIPHENHYDRAMINE HCL 50 MG/ML IJ SOLN
25.0000 mg | Freq: Once | INTRAMUSCULAR | Status: AC
  Administered 2017-09-09: 25 mg via INTRAVENOUS
  Filled 2017-09-09: qty 1

## 2017-09-09 MED ORDER — FAMOTIDINE IN NACL 20-0.9 MG/50ML-% IV SOLN
20.0000 mg | Freq: Once | INTRAVENOUS | Status: AC
Start: 2017-09-09 — End: 2017-09-09
  Administered 2017-09-09: 20 mg via INTRAVENOUS
  Filled 2017-09-09: qty 50

## 2017-09-09 MED ORDER — DEXAMETHASONE SODIUM PHOSPHATE 10 MG/ML IJ SOLN
10.0000 mg | Freq: Once | INTRAMUSCULAR | Status: AC
  Administered 2017-09-09: 10 mg via INTRAVENOUS
  Filled 2017-09-09: qty 1

## 2017-09-09 MED ORDER — SODIUM CHLORIDE 0.9% FLUSH
10.0000 mL | Freq: Once | INTRAVENOUS | Status: AC
  Administered 2017-09-09: 10 mL via INTRAVENOUS
  Filled 2017-09-09: qty 10

## 2017-09-09 MED ORDER — SODIUM CHLORIDE 0.9 % IV SOLN
45.0000 mg/m2 | Freq: Once | INTRAVENOUS | Status: AC
  Administered 2017-09-09: 66 mg via INTRAVENOUS
  Filled 2017-09-09: qty 11

## 2017-09-09 MED ORDER — PALONOSETRON HCL INJECTION 0.25 MG/5ML
0.2500 mg | Freq: Once | INTRAVENOUS | Status: AC
  Administered 2017-09-09: 0.25 mg via INTRAVENOUS
  Filled 2017-09-09: qty 5

## 2017-09-09 MED ORDER — SODIUM CHLORIDE 0.9 % IV SOLN
Freq: Once | INTRAVENOUS | Status: AC
  Administered 2017-09-09: 11:00:00 via INTRAVENOUS
  Filled 2017-09-09: qty 1000

## 2017-09-09 NOTE — Telephone Encounter (Signed)
Nutrition  Patient identified on Malnutrition Screening report for weight loss and poor appetite.    Called patient and left message to return call.  Eppie Barhorst B. Zenia Resides, [place], [place] Registered Dietitian [phone] (pager)

## 2017-09-09 NOTE — Progress Notes (Signed)
  Oncology Nurse Navigator Documentation  Navigator Location: [facility] Onc (09/09/17 1000)   )Navigator Encounter Type: Treatment (09/09/17 1000)                   Treatment Initiated Date: 09/08/17 (09/09/17 1000) Patient Visit Type: MedOnc (09/09/17 1000) Treatment Phase: First Chemo Tx (09/09/17 1000) Barriers/Navigation Needs: No barriers at this time;No Questions;No Needs (09/09/17 1000)   Interventions: None required (09/09/17 1000)    met with patient prior to receiving first chemo treatment today. All questions answered during visit. Reviewed treatment schedule with patient. Instructed to call if has any further questions or needs. Pt verbalized understanding. Nothing further needed at this time.                  Time Spent with Patient: 30 (09/09/17 1000)

## 2017-09-09 NOTE — Telephone Encounter (Signed)
Nutrition  Patient returned call and agreeable to nutrition appointment at [facility] on Monday, Feb 11 prior to radiation appointment.    Amalee Olsen B. Zenia Resides, [place], [place] Registered Dietitian [phone] (pager)

## 2017-09-10 ENCOUNTER — Ambulatory Visit
Admission: RE | Admit: 2017-09-10 | Discharge: 2017-09-10 | Disposition: A | Discharge status: Home / Self Care | Payer: Managed Care, Other (non HMO) | Source: Ambulatory Visit | Attending: Radiation Oncology | Admitting: Radiation Oncology

## 2017-09-10 DIAGNOSIS — C3411 Malignant neoplasm of upper lobe, right bronchus or lung: Secondary | ICD-10-CM | POA: Diagnosis not present

## 2017-09-11 ENCOUNTER — Ambulatory Visit
Admission: RE | Admit: 2017-09-11 | Discharge: 2017-09-11 | Disposition: A | Discharge status: Home / Self Care | Payer: Managed Care, Other (non HMO) | Source: Ambulatory Visit | Attending: Radiation Oncology | Admitting: Radiation Oncology

## 2017-09-11 DIAGNOSIS — C3411 Malignant neoplasm of upper lobe, right bronchus or lung: Secondary | ICD-10-CM | POA: Diagnosis not present

## 2017-09-12 ENCOUNTER — Encounter: Payer: Self-pay | Admitting: Oncology

## 2017-09-12 ENCOUNTER — Ambulatory Visit
Admission: RE | Admit: 2017-09-12 | Discharge: 2017-09-12 | Disposition: A | Discharge status: Home / Self Care | Payer: Managed Care, Other (non HMO) | Source: Ambulatory Visit | Attending: Radiation Oncology | Admitting: Radiation Oncology

## 2017-09-12 DIAGNOSIS — C3411 Malignant neoplasm of upper lobe, right bronchus or lung: Secondary | ICD-10-CM | POA: Diagnosis not present

## 2017-09-12 NOTE — Progress Notes (Signed)
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  INTERVAL HISTORY: Patient returns to clinic today for further evaluation and consideration of cycle 2 of weekly carboplatinum and Taxol.  She tolerated her first treatment well without significant side effects.  She is also tolerating daily XRT.  She currently feels well and is asymptomatic. She has no neurologic complaints.  She denies any recent fevers or illnesses. She has a good appetite.  She denies any chest pain, shortness of breath, hemoptysis, or cough.  She has no nausea, vomiting, constipation, or diarrhea.  She has no melena or hematochezia.  Patient offers no specific complaints today.  REVIEW OF SYSTEMS:   Review of Systems  Constitutional: Negative.  Negative for fever, malaise/fatigue and weight loss.  Respiratory: Negative.  Negative for cough and shortness of breath.   Cardiovascular: Negative.  Negative for chest pain and leg swelling.  Gastrointestinal: Negative.  Negative for abdominal pain, blood in stool and melena.  Genitourinary: Negative.  Negative for dysuria.  Musculoskeletal: Negative.   Skin: Negative.  Negative for rash.  Neurological: Negative.  Negative for sensory change and weakness.  Psychiatric/Behavioral: The patient is nervous/anxious.     As per HPI. Otherwise, a complete review of systems is negative.  PAST MEDICAL HISTORY: Past Medical History:  Diagnosis Date  . Arthritis    right shoulder  . Cancer ([place])    right lung  . Hyperlipidemia   . Hypertension   . Hypothyroidism   . Menopausal state   . Thyroid disease     PAST SURGICAL HISTORY: Past Surgical History:  Procedure Laterality Date  .  ABDOMINAL HYSTERECTOMY  2005  . BREAST EXCISIONAL BIOPSY Right 1979  . COLONOSCOPY WITH PROPOFOL N/A 05/13/2017   Procedure: COLONOSCOPY WITH PROPOFOL;  Surgeon: [name], MD;  Location: [facility] ENDOSCOPY;  Service: Gastroenterology;  Laterality: N/A;  . CYSTECTOMY Right    breast  . CYSTECTOMY  02/2015   back of neck  . ESOPHAGOGASTRODUODENOSCOPY (EGD) WITH PROPOFOL N/A 05/13/2017   Procedure: ESOPHAGOGASTRODUODENOSCOPY (EGD) WITH PROPOFOL;  Surgeon: [name], MD;  Location: [facility] ENDOSCOPY;  Service: Gastroenterology;  Laterality: N/A;  . PORTA CATH INSERTION N/A 09/01/2017   Procedure: PORTA CATH INSERTION;  Surgeon: Algernon Huxley, MD;  Location: [place] CV LAB;  Service: Cardiovascular;  Laterality: N/A;  . TUBAL LIGATION  1986    FAMILY HISTORY: Family History  Problem Relation Age of Onset  . Hypertension Mother   . Stroke Mother   . Leukemia Mother   . Stroke Father   . Pneumonia Father   . Diabetes Sister   . Hyperlipidemia Sister   . Breast cancer Maternal Aunt 76    ADVANCED DIRECTIVES (Y/N):  N  HEALTH MAINTENANCE: Social History   Tobacco Use  . Smoking status: Former Smoker    Packs/day: 0.25    Types: Cigarettes    Last attempt to quit: 02/01/2017    Years since quitting: 0.6  . Smokeless tobacco: Never Used  Substance Use Topics  . Alcohol use: No    Alcohol/week: 0.0 oz  . Drug use: No     Colonoscopy:  PAP:  Bone density:  Lipid panel:  No Known Allergies  Current Outpatient Medications  Medication Sig Dispense  Refill  . amLODipine (NORVASC) 5 MG tablet TAKE 1 TABLET DAILY 90 tablet 1  . atorvastatin (LIPITOR) 10 MG tablet TAKE 1 TABLET DAILY AT 6 P.M. 90 tablet 1  . levothyroxine (SYNTHROID, LEVOTHROID) 50 MCG tablet Take 1 tablet (50 mcg total) by mouth daily. 90 tablet 3  . lidocaine-prilocaine (EMLA) cream Apply to affected area once 30 g 3  . lisinopril (PRINIVIL,ZESTRIL) 20 MG tablet TAKE 1 TABLET DAILY 90 tablet 1  . ondansetron  (ZOFRAN) 8 MG tablet Take 1 tablet (8 mg total) by mouth 2 (two) times daily as needed for refractory nausea / vomiting. 30 tablet 2  . prochlorperazine (COMPAZINE) 10 MG tablet Take 1 tablet (10 mg total) by mouth every 6 (six) hours as needed (Nausea or vomiting). 60 tablet 2  . traMADol (ULTRAM) 50 MG tablet Take 1 tablet (50 mg total) by mouth 2 (two) times daily. 60 tablet 0  . sucralfate (CARAFATE) 1 g tablet Take 1 tablet (1 g total) by mouth 3 (three) times daily before meals. 90 tablet 3   No current facility-administered medications for this visit.     OBJECTIVE: Vitals:   09/16/17 0901  BP: 92/61  Pulse: 89  Temp: (!) 96.8 F (36 C)     Body mass index is 22.13 kg/m.    ECOG FS:0 - Asymptomatic  General: Well-developed, well-nourished, no acute distress. Eyes: Pink conjunctiva, anicteric sclera. Lungs: Clear to auscultation bilaterally. Heart: Regular rate and rhythm. No rubs, murmurs, or gallops. Abdomen: Soft, nontender, nondistended. No organomegaly noted, normoactive bowel sounds. Musculoskeletal: No edema, cyanosis, or clubbing. Neuro: Alert, answering all questions appropriately. Cranial nerves grossly intact. Skin: No rashes or petechiae noted. Psych: Normal affect.   LAB RESULTS:  Lab Results  Component Value Date   NA 133 (L) 09/16/2017   K 3.8 09/16/2017   CL 99 (L) 09/16/2017   CO2 25 09/16/2017   GLUCOSE 134 (H) 09/16/2017   BUN 33 (H) 09/16/2017   CREATININE 0.93 09/16/2017   CALCIUM 8.9 09/16/2017   PROT 6.9 09/16/2017   ALBUMIN 3.6 09/16/2017   AST 19 09/16/2017   ALT 10 (L) 09/16/2017   ALKPHOS 64 09/16/2017   BILITOT 0.5 09/16/2017   GFRNONAA >60 09/16/2017   GFRAA >60 09/16/2017    Lab Results  Component Value Date   WBC 4.3 09/16/2017   NEUTROABS 3.1 09/16/2017   HGB 11.5 (L) 09/16/2017   HCT 34.8 (L) 09/16/2017   MCV 93.1 09/16/2017   PLT 319 09/16/2017     STUDIES: No results found.  ASSESSMENT: Stage IIIB adenocarcinoma  of the right upper lobe lung.  PLAN:    1. 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  Patient will receive concurrent radiation and chemotherapy with weekly carboplatinum and Taxol.  Given the stage of disease, she will also benefit from maintenance immunotherapy with Imfinzi every 2 weeks for 12 months.  Continue daily XRT.  Proceed with cycle 2 of weekly carboplatinum and Taxol.  Return to clinic in 1 week for consideration of cycle 3.  Approximately 30 minutes was spent in discussion of which greater than 50% was consultation.  Patient expressed understanding and was in agreement with this plan. She also understands that She can call clinic at any time with any questions, concerns, or complaints.   Cancer Staging Cancer of upper lobe of right lung [facility]) Staging form: Lung, AJCC 8th Edition - Clinical  stage from 08/24/2017: Stage IIIB (cT2a, cN3, cM0) - Signed by Lloyd Huger, MD on 08/24/2017   Lloyd Huger, MD   09/19/2017 11:14 AM

## 2017-09-15 ENCOUNTER — Ambulatory Visit
Admission: RE | Admit: 2017-09-15 | Discharge: 2017-09-15 | Disposition: A | Discharge status: Home / Self Care | Payer: Managed Care, Other (non HMO) | Source: Ambulatory Visit | Attending: Radiation Oncology | Admitting: Radiation Oncology

## 2017-09-15 ENCOUNTER — Inpatient Hospital Stay: Payer: Managed Care, Other (non HMO)

## 2017-09-15 DIAGNOSIS — C3411 Malignant neoplasm of upper lobe, right bronchus or lung: Secondary | ICD-10-CM | POA: Diagnosis not present

## 2017-09-15 NOTE — Progress Notes (Signed)
Nutrition Assessment   Reason for Assessment:   Patient identified on Malnutrition Screening report for weight loss and poor appetite  ASSESSMENT:  62 year old female with lung cancer currently receiving radiation and chemotherapy.  Past medical history of HLD, HTN, thyroid disease.    Met with patient in clinic today prior to radiation therapy.  Patient reports foods do not taste right (started prior to chemotherapy) and smell is off.  Reports ate a piece of toast and drank coffee this am for breakfast.  Yesterday ate few bites of fish for lunch and small bowl of ice cream for dinner.  Reports drank 2 ensure original/also has plus.  Patient reports problems with constipation and points to mid chest saying she feels like food gets hung in there (noted saw GI as recently at 12/6). Reports drinks water, cranberry juice mostly.    Reports no nausea  Nutrition Focused Physical Exam: deferred patient wearing jacket and fully clothed  Medications: zofran, compazine  Labs: reviewed  Anthropometrics:   Height: 60 inches Weight: 115 lb 4.8 oz UBW: 140s in August 2018 per patient (noted 143 lb 04/04/17) BMI: 22  20% weight loss in the last 5 1/2 months, significant   Estimated Energy Needs  Kcals: 1500-1800 calories/d Protein: 62-78 g/d Fluid: 1.8 L/d  NUTRITION DIAGNOSIS: Malnutrition related to cancer and cancer related treaments as evidenced by 20% weight loss in 5 1/2 months and eating < 75% of energy needs for > or equal to month   MALNUTRITION DIAGNOSIS: Patient meets criteria for severe malnutrition in context of chronic illness as evidenced by 20% weight loss in 5 1/2 months and eating < or equal to 75% of energy needs for > or equal to 1 month   INTERVENTION:   Discussed importance of good nutrition during treatment. Encouraged small frequent meals. Encouraged high calorie, high protein oral nutrition supplement 3 times per day.  Provided patient with 1st complimentary  case of ensure plus today Discussed importance of soft, moist high calorie, high protein foods to help with ease of moving foods through esophagus.  Handout provided Discussed taste change and strategies to help. Handout provided Patient may benefit from appetite stimulant, if MD feels appropriate.   Contact information provided    MONITORING, EVALUATION, GOAL: weight trends, intake   NEXT VISIT: March 7 prior to radiation  Monea Pesantez B. Zenia Resides, [place], [place] Registered Dietitian [phone] (pager)

## 2017-09-16 ENCOUNTER — Encounter: Payer: Self-pay | Admitting: Oncology

## 2017-09-16 ENCOUNTER — Inpatient Hospital Stay (HOSPITAL_BASED_OUTPATIENT_CLINIC_OR_DEPARTMENT_OTHER): Payer: Managed Care, Other (non HMO) | Admitting: Oncology

## 2017-09-16 ENCOUNTER — Other Ambulatory Visit: Payer: Self-pay

## 2017-09-16 ENCOUNTER — Inpatient Hospital Stay: Payer: Managed Care, Other (non HMO)

## 2017-09-16 ENCOUNTER — Ambulatory Visit
Admission: RE | Admit: 2017-09-16 | Discharge: 2017-09-16 | Disposition: A | Discharge status: Home / Self Care | Payer: Managed Care, Other (non HMO) | Source: Ambulatory Visit | Attending: Radiation Oncology | Admitting: Radiation Oncology

## 2017-09-16 VITALS — BP 92/61 | HR 89 | Temp 96.8°F | Wt 113.3 lb

## 2017-09-16 DIAGNOSIS — Z79899 Other long term (current) drug therapy: Secondary | ICD-10-CM

## 2017-09-16 DIAGNOSIS — I10 Essential (primary) hypertension: Secondary | ICD-10-CM

## 2017-09-16 DIAGNOSIS — Z87891 Personal history of nicotine dependence: Secondary | ICD-10-CM | POA: Diagnosis not present

## 2017-09-16 DIAGNOSIS — Z803 Family history of malignant neoplasm of breast: Secondary | ICD-10-CM

## 2017-09-16 DIAGNOSIS — E785 Hyperlipidemia, unspecified: Secondary | ICD-10-CM

## 2017-09-16 DIAGNOSIS — E039 Hypothyroidism, unspecified: Secondary | ICD-10-CM

## 2017-09-16 DIAGNOSIS — F419 Anxiety disorder, unspecified: Secondary | ICD-10-CM | POA: Diagnosis not present

## 2017-09-16 DIAGNOSIS — C3411 Malignant neoplasm of upper lobe, right bronchus or lung: Secondary | ICD-10-CM

## 2017-09-16 DIAGNOSIS — M1990 Unspecified osteoarthritis, unspecified site: Secondary | ICD-10-CM | POA: Diagnosis not present

## 2017-09-16 LAB — COMPREHENSIVE METABOLIC PANEL
ALBUMIN: 3.6 g/dL (ref 3.5–5.0)
ALT: 10 U/L — ABNORMAL LOW (ref 14–54)
ANION GAP: 9 (ref 5–15)
AST: 19 U/L (ref 15–41)
Alkaline Phosphatase: 64 U/L (ref 38–126)
BILIRUBIN TOTAL: 0.5 mg/dL (ref 0.3–1.2)
BUN: 33 mg/dL — ABNORMAL HIGH (ref 6–20)
CO2: 25 mmol/L (ref 22–32)
Calcium: 8.9 mg/dL (ref 8.9–10.3)
Chloride: 99 mmol/L — ABNORMAL LOW (ref 101–111)
Creatinine, Ser: 0.93 mg/dL (ref 0.44–1.00)
GFR calc Af Amer: >60 mL/min (ref >60)
GFR calc non Af Amer: >60 mL/min (ref >60)
GLUCOSE: 134 mg/dL — AB (ref 65–99)
POTASSIUM: 3.8 mmol/L (ref 3.5–5.1)
Sodium: 133 mmol/L — ABNORMAL LOW (ref 135–145)
TOTAL PROTEIN: 6.9 g/dL (ref 6.5–8.1)

## 2017-09-16 LAB — CBC WITH DIFFERENTIAL/PLATELET
BASOS ABS: 0 10*3/uL (ref 0–0.1)
Basophils Relative: 1 %
Eosinophils Absolute: 0 10*3/uL (ref 0–0.7)
Eosinophils Relative: 1 %
HEMATOCRIT: 34.8 % — AB (ref 35.0–47.0)
Hemoglobin: 11.5 g/dL — ABNORMAL LOW (ref 12.0–16.0)
LYMPHS PCT: 21 %
Lymphs Abs: 0.9 10*3/uL — ABNORMAL LOW (ref 1.0–3.6)
MCH: 30.9 pg (ref 26.0–34.0)
MCHC: 33.2 g/dL (ref 32.0–36.0)
MCV: 93.1 fL (ref 80.0–100.0)
MONO ABS: 0.3 10*3/uL (ref 0.2–0.9)
Monocytes Relative: 6 %
NEUTROS ABS: 3.1 10*3/uL (ref 1.4–6.5)
Neutrophils Relative %: 71 %
Platelets: 319 10*3/uL (ref 150–440)
RBC: 3.73 MIL/uL — AB (ref 3.80–5.20)
RDW: 12.4 % (ref 11.5–14.5)
WBC: 4.3 10*3/uL (ref 3.6–11.0)

## 2017-09-16 MED ORDER — PACLITAXEL CHEMO INJECTION 300 MG/50ML
45.0000 mg/m2 | Freq: Once | INTRAVENOUS | Status: AC
  Administered 2017-09-16: 66 mg via INTRAVENOUS
  Filled 2017-09-16: qty 11

## 2017-09-16 MED ORDER — SODIUM CHLORIDE 0.9 % IV SOLN
170.0000 mg | Freq: Once | INTRAVENOUS | Status: AC
  Administered 2017-09-16: 170 mg via INTRAVENOUS
  Filled 2017-09-16: qty 17

## 2017-09-16 MED ORDER — SODIUM CHLORIDE 0.9% FLUSH
10.0000 mL | Freq: Once | INTRAVENOUS | Status: AC
  Administered 2017-09-16: 10 mL via INTRAVENOUS
  Filled 2017-09-16: qty 10

## 2017-09-16 MED ORDER — HEPARIN SOD (PORK) LOCK FLUSH 100 UNIT/ML IV SOLN
500.0000 [IU] | Freq: Once | INTRAVENOUS | Status: AC
  Administered 2017-09-16: 500 [IU] via INTRAVENOUS
  Filled 2017-09-16: qty 5

## 2017-09-16 MED ORDER — SODIUM CHLORIDE 0.9 % IV SOLN
Freq: Once | INTRAVENOUS | Status: AC
  Administered 2017-09-16: 10:00:00 via INTRAVENOUS
  Filled 2017-09-16: qty 1000

## 2017-09-16 MED ORDER — FAMOTIDINE IN NACL 20-0.9 MG/50ML-% IV SOLN
20.0000 mg | Freq: Once | INTRAVENOUS | Status: AC
  Administered 2017-09-16: 20 mg via INTRAVENOUS
  Filled 2017-09-16: qty 50

## 2017-09-16 MED ORDER — PALONOSETRON HCL INJECTION 0.25 MG/5ML
0.2500 mg | Freq: Once | INTRAVENOUS | Status: AC
  Administered 2017-09-16: 0.25 mg via INTRAVENOUS
  Filled 2017-09-16: qty 5

## 2017-09-16 MED ORDER — DIPHENHYDRAMINE HCL 50 MG/ML IJ SOLN
25.0000 mg | Freq: Once | INTRAMUSCULAR | Status: AC
  Administered 2017-09-16: 25 mg via INTRAVENOUS
  Filled 2017-09-16: qty 1

## 2017-09-17 ENCOUNTER — Other Ambulatory Visit: Payer: Self-pay | Admitting: *Deleted

## 2017-09-17 ENCOUNTER — Ambulatory Visit
Admission: RE | Admit: 2017-09-17 | Discharge: 2017-09-17 | Disposition: A | Discharge status: Home / Self Care | Payer: Managed Care, Other (non HMO) | Source: Ambulatory Visit | Attending: Radiation Oncology | Admitting: Radiation Oncology

## 2017-09-17 DIAGNOSIS — C3411 Malignant neoplasm of upper lobe, right bronchus or lung: Secondary | ICD-10-CM | POA: Diagnosis not present

## 2017-09-17 MED ORDER — SUCRALFATE 1 G PO TABS: 1.0000 g | ORAL_TABLET | Freq: Three times a day (TID) | ORAL | 3 refills | Status: DC

## 2017-09-18 ENCOUNTER — Ambulatory Visit
Admission: RE | Admit: 2017-09-18 | Discharge: 2017-09-18 | Disposition: A | Discharge status: Home / Self Care | Payer: Managed Care, Other (non HMO) | Source: Ambulatory Visit | Attending: Radiation Oncology | Admitting: Radiation Oncology

## 2017-09-18 DIAGNOSIS — C3411 Malignant neoplasm of upper lobe, right bronchus or lung: Secondary | ICD-10-CM | POA: Diagnosis not present

## 2017-09-19 ENCOUNTER — Ambulatory Visit
Admission: RE | Admit: 2017-09-19 | Discharge: 2017-09-19 | Disposition: A | Discharge status: Home / Self Care | Payer: Managed Care, Other (non HMO) | Source: Ambulatory Visit | Attending: Radiation Oncology | Admitting: Radiation Oncology

## 2017-09-19 DIAGNOSIS — C3411 Malignant neoplasm of upper lobe, right bronchus or lung: Secondary | ICD-10-CM | POA: Diagnosis not present

## 2017-09-19 LAB — FUNGUS CULTURE RESULT

## 2017-09-19 LAB — FUNGAL ORGANISM REFLEX

## 2017-09-19 LAB — FUNGUS CULTURE WITH STAIN

## 2017-09-21 NOTE — Progress Notes (Signed)
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  INTERVAL HISTORY: Patient returns to clinic today for further evaluation and consideration of cycle 3 of weekly carboplatinum and Taxol.  She is tolerating her treatments well without significant side effects.  She is also tolerating daily XRT.  She complains of a decreased appetite, but otherwise feels well and is asymptomatic. She has no neurologic complaints.  She denies any recent fevers or illnesses. She denies any chest pain, shortness of breath, hemoptysis, or cough.  She has no nausea, vomiting, constipation, or diarrhea.  She has no melena or hematochezia.  Patient offers no further specific complaints today.  REVIEW OF SYSTEMS:   Review of Systems  Constitutional: Negative.  Negative for fever, malaise/fatigue and weight loss.  Respiratory: Negative.  Negative for cough and shortness of breath.   Cardiovascular: Negative.  Negative for chest pain and leg swelling.  Gastrointestinal: Negative.  Negative for abdominal pain, blood in stool and melena.  Genitourinary: Negative.  Negative for dysuria.  Musculoskeletal: Negative.   Skin: Negative.  Negative for rash.  Neurological: Negative.  Negative for sensory change and weakness.  Psychiatric/Behavioral: The patient is nervous/anxious.     As per HPI. Otherwise, a complete review of systems is negative.  PAST MEDICAL HISTORY: Past Medical History:  Diagnosis Date  . Arthritis    right shoulder  . Cancer ([place])    right lung  . Hyperlipidemia   . Hypertension   . Hypothyroidism   . Menopausal state   . Thyroid disease     PAST SURGICAL HISTORY: Past Surgical History:  Procedure  Laterality Date  . ABDOMINAL HYSTERECTOMY  2005  . BREAST EXCISIONAL BIOPSY Right 1979  . COLONOSCOPY WITH PROPOFOL N/A 05/13/2017   Procedure: COLONOSCOPY WITH PROPOFOL;  Surgeon: [name], MD;  Location: [facility] ENDOSCOPY;  Service: Gastroenterology;  Laterality: N/A;  . CYSTECTOMY Right    breast  . CYSTECTOMY  02/2015   back of neck  . ESOPHAGOGASTRODUODENOSCOPY (EGD) WITH PROPOFOL N/A 05/13/2017   Procedure: ESOPHAGOGASTRODUODENOSCOPY (EGD) WITH PROPOFOL;  Surgeon: [name], MD;  Location: [facility] ENDOSCOPY;  Service: Gastroenterology;  Laterality: N/A;  . PORTA CATH INSERTION N/A 09/01/2017   Procedure: PORTA CATH INSERTION;  Surgeon: Algernon Huxley, MD;  Location: [place] CV LAB;  Service: Cardiovascular;  Laterality: N/A;  . TUBAL LIGATION  1986    FAMILY HISTORY: Family History  Problem Relation Age of Onset  . Hypertension Mother   . Stroke Mother   . Leukemia Mother   . Stroke Father   . Pneumonia Father   . Diabetes Sister   . Hyperlipidemia Sister   . Breast cancer Maternal Aunt 68    ADVANCED DIRECTIVES (Y/N):  N  HEALTH MAINTENANCE: Social History   Tobacco Use  . Smoking status: Former Smoker    Packs/day: 0.25    Types: Cigarettes    Last attempt to quit: 02/01/2017    Years since quitting: 0.6  . Smokeless tobacco: Never Used  Substance Use Topics  . Alcohol use: No    Alcohol/week: 0.0 oz  . Drug use: No     Colonoscopy:  PAP:  Bone density:  Lipid panel:  No Known Allergies  Current Outpatient Medications  Medication Sig  Dispense Refill  . amLODipine (NORVASC) 5 MG tablet TAKE 1 TABLET DAILY 90 tablet 1  . atorvastatin (LIPITOR) 10 MG tablet TAKE 1 TABLET DAILY AT 6 P.M. 90 tablet 1  . levothyroxine (SYNTHROID, LEVOTHROID) 50 MCG tablet Take 1 tablet (50 mcg total) by mouth daily. 90 tablet 3  . lidocaine-prilocaine (EMLA) cream Apply to affected area once 30 g 3  . lisinopril (PRINIVIL,ZESTRIL) 20 MG tablet TAKE 1 TABLET DAILY 90 tablet  1  . sucralfate (CARAFATE) 1 g tablet Take 1 tablet (1 g total) by mouth 3 (three) times daily before meals. 90 tablet 3  . megestrol (MEGACE) 40 MG tablet Take 1 tablet (40 mg total) by mouth daily. 30 tablet 2  . ondansetron (ZOFRAN) 8 MG tablet Take 1 tablet (8 mg total) by mouth 2 (two) times daily as needed for refractory nausea / vomiting. (Patient not taking: Reported on 09/23/2017) 30 tablet 2  . prochlorperazine (COMPAZINE) 10 MG tablet Take 1 tablet (10 mg total) by mouth every 6 (six) hours as needed (Nausea or vomiting). (Patient not taking: Reported on 09/23/2017) 60 tablet 2  . traMADol (ULTRAM) 50 MG tablet Take 1 tablet (50 mg total) by mouth 2 (two) times daily. (Patient not taking: Reported on 09/23/2017) 60 tablet 0   No current facility-administered medications for this visit.     OBJECTIVE: Vitals:   09/23/17 1007  BP: 97/65  Pulse: 93  Resp: 18  Temp: 97.7 F (36.5 C)     Body mass index is 21.81 kg/m.    ECOG FS:0 - Asymptomatic  General: Well-developed, well-nourished, no acute distress. Eyes: Pink conjunctiva, anicteric sclera. Lungs: Clear to auscultation bilaterally. Heart: Regular rate and rhythm. No rubs, murmurs, or gallops. Abdomen: Soft, nontender, nondistended. No organomegaly noted, normoactive bowel sounds. Musculoskeletal: No edema, cyanosis, or clubbing. Neuro: Alert, answering all questions appropriately. Cranial nerves grossly intact. Skin: No rashes or petechiae noted. Psych: Normal affect.   LAB RESULTS:  Lab Results  Component Value Date   NA 137 09/23/2017   K 3.4 (L) 09/23/2017   CL 103 09/23/2017   CO2 25 09/23/2017   GLUCOSE 126 (H) 09/23/2017   BUN 19 09/23/2017   CREATININE 0.75 09/23/2017   CALCIUM 9.1 09/23/2017   PROT 6.9 09/23/2017   ALBUMIN 3.8 09/23/2017   AST 25 09/23/2017   ALT 13 (L) 09/23/2017   ALKPHOS 59 09/23/2017   BILITOT 0.3 09/23/2017   GFRNONAA >60 09/23/2017   GFRAA >60 09/23/2017    Lab Results    Component Value Date   WBC 2.6 (L) 09/23/2017   NEUTROABS 1.7 09/23/2017   HGB 11.3 (L) 09/23/2017   HCT 33.6 (L) 09/23/2017   MCV 92.5 09/23/2017   PLT 293 09/23/2017     STUDIES: No results found.  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  PLAN:    1. 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  Patient will receive concurrent radiation and chemotherapy with weekly carboplatinum and Taxol.  Given the stage of disease, she will also benefit from maintenance immunotherapy with Imfinzi every 2 weeks for 12 months. Continue daily XRT completing on October 09, 2017.  Proceed with cycle 3 of weekly carboplatinum and Taxol. Return to clinic in 1 week for consideration of cycle 4. 2. Leukopenia: Mild, monitor. 3. Poor appetite: Patient was given a prescription for Megace today.  Approximately 30 minutes was  spent in discussion of which greater than 50% was consultation.  Patient expressed understanding and was in agreement with this plan. She also understands that She can call clinic at any time with any questions, concerns, or complaints.   Cancer Staging Cancer of upper lobe of right lung [facility]) Staging form: Lung, AJCC 8th Edition - Clinical stage from 08/24/2017: Stage IIIB (cT2a, cN3, cM0) - Signed by Lloyd Huger, MD on 08/24/2017   Lloyd Huger, MD   09/23/2017 10:45 AM

## 2017-09-22 ENCOUNTER — Ambulatory Visit
Admission: RE | Admit: 2017-09-22 | Discharge: 2017-09-22 | Disposition: A | Discharge status: Home / Self Care | Payer: Managed Care, Other (non HMO) | Source: Ambulatory Visit | Attending: Radiation Oncology | Admitting: Radiation Oncology

## 2017-09-22 DIAGNOSIS — C3411 Malignant neoplasm of upper lobe, right bronchus or lung: Secondary | ICD-10-CM | POA: Diagnosis not present

## 2017-09-23 ENCOUNTER — Inpatient Hospital Stay: Payer: Managed Care, Other (non HMO)

## 2017-09-23 ENCOUNTER — Ambulatory Visit
Admission: RE | Admit: 2017-09-23 | Discharge: 2017-09-23 | Disposition: A | Discharge status: Home / Self Care | Payer: Managed Care, Other (non HMO) | Source: Ambulatory Visit | Attending: Radiation Oncology | Admitting: Radiation Oncology

## 2017-09-23 ENCOUNTER — Inpatient Hospital Stay (HOSPITAL_BASED_OUTPATIENT_CLINIC_OR_DEPARTMENT_OTHER): Payer: Managed Care, Other (non HMO) | Admitting: Oncology

## 2017-09-23 ENCOUNTER — Other Ambulatory Visit: Payer: Self-pay

## 2017-09-23 VITALS — BP 97/65 | HR 93 | Temp 97.7°F | Resp 18 | Wt 111.7 lb

## 2017-09-23 DIAGNOSIS — C3411 Malignant neoplasm of upper lobe, right bronchus or lung: Secondary | ICD-10-CM

## 2017-09-23 DIAGNOSIS — E785 Hyperlipidemia, unspecified: Secondary | ICD-10-CM | POA: Diagnosis not present

## 2017-09-23 DIAGNOSIS — R419 Unspecified symptoms and signs involving cognitive functions and awareness: Secondary | ICD-10-CM

## 2017-09-23 DIAGNOSIS — R630 Anorexia: Secondary | ICD-10-CM

## 2017-09-23 DIAGNOSIS — Z87891 Personal history of nicotine dependence: Secondary | ICD-10-CM | POA: Diagnosis not present

## 2017-09-23 DIAGNOSIS — E039 Hypothyroidism, unspecified: Secondary | ICD-10-CM | POA: Diagnosis not present

## 2017-09-23 DIAGNOSIS — Z79899 Other long term (current) drug therapy: Secondary | ICD-10-CM

## 2017-09-23 DIAGNOSIS — D72819 Decreased white blood cell count, unspecified: Secondary | ICD-10-CM | POA: Diagnosis not present

## 2017-09-23 DIAGNOSIS — I10 Essential (primary) hypertension: Secondary | ICD-10-CM

## 2017-09-23 DIAGNOSIS — M1990 Unspecified osteoarthritis, unspecified site: Secondary | ICD-10-CM

## 2017-09-23 LAB — CBC WITH DIFFERENTIAL/PLATELET
Basophils Absolute: 0 10*3/uL (ref 0–0.1)
Basophils Relative: 1 %
EOS ABS: 0 10*3/uL (ref 0–0.7)
EOS PCT: 1 %
HCT: 33.6 % — ABNORMAL LOW (ref 35.0–47.0)
HEMOGLOBIN: 11.3 g/dL — AB (ref 12.0–16.0)
LYMPHS ABS: 0.7 10*3/uL — AB (ref 1.0–3.6)
Lymphocytes Relative: 26 %
MCH: 31 pg (ref 26.0–34.0)
MCHC: 33.5 g/dL (ref 32.0–36.0)
MCV: 92.5 fL (ref 80.0–100.0)
MONO ABS: 0.1 10*3/uL — AB (ref 0.2–0.9)
MONOS PCT: 5 %
Neutro Abs: 1.7 10*3/uL (ref 1.4–6.5)
Neutrophils Relative %: 67 %
PLATELETS: 293 10*3/uL (ref 150–440)
RBC: 3.64 MIL/uL — ABNORMAL LOW (ref 3.80–5.20)
RDW: 12.9 % (ref 11.5–14.5)
WBC: 2.6 10*3/uL — ABNORMAL LOW (ref 3.6–11.0)

## 2017-09-23 LAB — COMPREHENSIVE METABOLIC PANEL
ALT: 13 U/L — AB (ref 14–54)
AST: 25 U/L (ref 15–41)
Albumin: 3.8 g/dL (ref 3.5–5.0)
Alkaline Phosphatase: 59 U/L (ref 38–126)
Anion gap: 9 (ref 5–15)
BUN: 19 mg/dL (ref 6–20)
CALCIUM: 9.1 mg/dL (ref 8.9–10.3)
CHLORIDE: 103 mmol/L (ref 101–111)
CO2: 25 mmol/L (ref 22–32)
CREATININE: 0.75 mg/dL (ref 0.44–1.00)
GFR calc Af Amer: >60 mL/min (ref >60)
GFR calc non Af Amer: >60 mL/min (ref >60)
GLUCOSE: 126 mg/dL — AB (ref 65–99)
Potassium: 3.4 mmol/L — ABNORMAL LOW (ref 3.5–5.1)
SODIUM: 137 mmol/L (ref 135–145)
Total Bilirubin: 0.3 mg/dL (ref 0.3–1.2)
Total Protein: 6.9 g/dL (ref 6.5–8.1)

## 2017-09-23 MED ORDER — SODIUM CHLORIDE 0.9 % IV SOLN: 10.0000 mg | Freq: Once | INTRAVENOUS | Status: DC

## 2017-09-23 MED ORDER — FAMOTIDINE IN NACL 20-0.9 MG/50ML-% IV SOLN: 20.0000 mg | Freq: Once | INTRAVENOUS | Status: DC

## 2017-09-23 MED ORDER — HEPARIN SOD (PORK) LOCK FLUSH 100 UNIT/ML IV SOLN
500.0000 [IU] | Freq: Once | INTRAVENOUS | Status: AC
  Administered 2017-09-23: 500 [IU] via INTRAVENOUS
  Filled 2017-09-23: qty 5

## 2017-09-23 MED ORDER — DEXAMETHASONE SODIUM PHOSPHATE 10 MG/ML IJ SOLN
10.0000 mg | Freq: Once | INTRAMUSCULAR | Status: AC
  Administered 2017-09-23: 10 mg via INTRAVENOUS
  Filled 2017-09-23: qty 1

## 2017-09-23 MED ORDER — FAMOTIDINE 200 MG/20ML IV SOLN
Freq: Once | INTRAVENOUS | Status: AC
  Administered 2017-09-23: 11:00:00 via INTRAVENOUS
  Filled 2017-09-23: qty 100

## 2017-09-23 MED ORDER — SODIUM CHLORIDE 0.9 % IV SOLN
45.0000 mg/m2 | Freq: Once | INTRAVENOUS | Status: AC
  Administered 2017-09-23: 66 mg via INTRAVENOUS
  Filled 2017-09-23: qty 11

## 2017-09-23 MED ORDER — SODIUM CHLORIDE 0.9 % IV SOLN
Freq: Once | INTRAVENOUS | Status: AC
  Administered 2017-09-23: 11:00:00 via INTRAVENOUS
  Filled 2017-09-23: qty 1000

## 2017-09-23 MED ORDER — DIPHENHYDRAMINE HCL 50 MG/ML IJ SOLN
25.0000 mg | Freq: Once | INTRAMUSCULAR | Status: AC
  Administered 2017-09-23: 25 mg via INTRAVENOUS
  Filled 2017-09-23: qty 1

## 2017-09-23 MED ORDER — SODIUM CHLORIDE 0.9 % IV SOLN
170.0000 mg | Freq: Once | INTRAVENOUS | Status: AC
  Administered 2017-09-23: 170 mg via INTRAVENOUS
  Filled 2017-09-23: qty 17

## 2017-09-23 MED ORDER — PALONOSETRON HCL INJECTION 0.25 MG/5ML
0.2500 mg | Freq: Once | INTRAVENOUS | Status: AC
  Administered 2017-09-23: 0.25 mg via INTRAVENOUS
  Filled 2017-09-23: qty 5

## 2017-09-23 MED ORDER — MEGESTROL ACETATE 40 MG PO TABS: 40.0000 mg | ORAL_TABLET | Freq: Every day | ORAL | 2 refills | Status: DC

## 2017-09-23 NOTE — Progress Notes (Signed)
Here for follow up

## 2017-09-24 ENCOUNTER — Ambulatory Visit
Admission: RE | Admit: 2017-09-24 | Discharge: 2017-09-24 | Disposition: A | Discharge status: Home / Self Care | Payer: Managed Care, Other (non HMO) | Source: Ambulatory Visit | Attending: Radiation Oncology | Admitting: Radiation Oncology

## 2017-09-24 DIAGNOSIS — C3411 Malignant neoplasm of upper lobe, right bronchus or lung: Secondary | ICD-10-CM | POA: Diagnosis not present

## 2017-09-25 ENCOUNTER — Ambulatory Visit
Admission: RE | Admit: 2017-09-25 | Discharge: 2017-09-25 | Disposition: A | Discharge status: Home / Self Care | Payer: Managed Care, Other (non HMO) | Source: Ambulatory Visit | Attending: Radiation Oncology | Admitting: Radiation Oncology

## 2017-09-25 DIAGNOSIS — C3411 Malignant neoplasm of upper lobe, right bronchus or lung: Secondary | ICD-10-CM | POA: Diagnosis not present

## 2017-09-26 ENCOUNTER — Ambulatory Visit
Admission: RE | Admit: 2017-09-26 | Discharge: 2017-09-26 | Disposition: A | Discharge status: Home / Self Care | Payer: Managed Care, Other (non HMO) | Source: Ambulatory Visit | Attending: Radiation Oncology | Admitting: Radiation Oncology

## 2017-09-26 DIAGNOSIS — C3411 Malignant neoplasm of upper lobe, right bronchus or lung: Secondary | ICD-10-CM | POA: Diagnosis not present

## 2017-09-28 NOTE — Progress Notes (Signed)
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  INTERVAL HISTORY: Patient returns to clinic today for further evaluation and consideration of cycle 4 of weekly carboplatinum and Taxol.  She continues to tolerate her treatments well without significant side effects. She is also tolerating daily XRT. She has no neurologic complaints.  She denies any recent fevers or illnesses. She denies any chest pain, shortness of breath, hemoptysis, or cough.  She has no nausea, vomiting, constipation, or diarrhea.  She has no melena or hematochezia.  Patient offers no further specific complaints today.  REVIEW OF SYSTEMS:   Review of Systems  Constitutional: Negative.  Negative for fever, malaise/fatigue and weight loss.  Respiratory: Negative.  Negative for cough and shortness of breath.   Cardiovascular: Negative.  Negative for chest pain and leg swelling.  Gastrointestinal: Negative.  Negative for abdominal pain, blood in stool and melena.  Genitourinary: Negative.  Negative for dysuria.  Musculoskeletal: Negative.   Skin: Negative.  Negative for rash.  Neurological: Negative.  Negative for sensory change and weakness.  Psychiatric/Behavioral: Negative.  The patient is not nervous/anxious.     As per HPI. Otherwise, a complete review of systems is negative.  PAST MEDICAL HISTORY: Past Medical History:  Diagnosis Date  . Arthritis    right shoulder  . Cancer ([place])    right lung  . Hyperlipidemia   . Hypertension   . Hypothyroidism   . Menopausal state   . Thyroid disease     PAST SURGICAL HISTORY: Past Surgical History:  Procedure Laterality Date  . ABDOMINAL HYSTERECTOMY  2005  . BREAST  EXCISIONAL BIOPSY Right 1979  . COLONOSCOPY WITH PROPOFOL N/A 05/13/2017   Procedure: COLONOSCOPY WITH PROPOFOL;  Surgeon: [name], MD;  Location: [facility] ENDOSCOPY;  Service: Gastroenterology;  Laterality: N/A;  . CYSTECTOMY Right    breast  . CYSTECTOMY  02/2015   back of neck  . ESOPHAGOGASTRODUODENOSCOPY (EGD) WITH PROPOFOL N/A 05/13/2017   Procedure: ESOPHAGOGASTRODUODENOSCOPY (EGD) WITH PROPOFOL;  Surgeon: [name], MD;  Location: [facility] ENDOSCOPY;  Service: Gastroenterology;  Laterality: N/A;  . PORTA CATH INSERTION N/A 09/01/2017   Procedure: PORTA CATH INSERTION;  Surgeon: Algernon Huxley, MD;  Location: [place] CV LAB;  Service: Cardiovascular;  Laterality: N/A;  . TUBAL LIGATION  1986    FAMILY HISTORY: Family History  Problem Relation Age of Onset  . Hypertension Mother   . Stroke Mother   . Leukemia Mother   . Stroke Father   . Pneumonia Father   . Diabetes Sister   . Hyperlipidemia Sister   . Breast cancer Maternal Aunt 46    ADVANCED DIRECTIVES (Y/N):  N  HEALTH MAINTENANCE: Social History   Tobacco Use  . Smoking status: Former Smoker    Packs/day: 0.25    Types: Cigarettes    Last attempt to quit: 02/01/2017    Years since quitting: 0.6  . Smokeless tobacco: Never Used  Substance Use Topics  . Alcohol use: No    Alcohol/week: 0.0 oz  . Drug use: No     Colonoscopy:  PAP:  Bone density:  Lipid panel:  No Known Allergies  Current Outpatient Medications  Medication Sig Dispense Refill  . amLODipine (NORVASC) 5 MG tablet TAKE 1  TABLET DAILY 90 tablet 1  . atorvastatin (LIPITOR) 10 MG tablet TAKE 1 TABLET DAILY AT 6 P.M. 90 tablet 1  . levothyroxine (SYNTHROID, LEVOTHROID) 50 MCG tablet Take 1 tablet (50 mcg total) by mouth daily. 90 tablet 3  . lidocaine-prilocaine (EMLA) cream Apply to affected area once 30 g 3  . lisinopril (PRINIVIL,ZESTRIL) 20 MG tablet TAKE 1 TABLET DAILY 90 tablet 1  . megestrol (MEGACE) 40 MG tablet Take 1 tablet (40 mg  total) by mouth daily. 30 tablet 2  . ondansetron (ZOFRAN) 8 MG tablet Take 1 tablet (8 mg total) by mouth 2 (two) times daily as needed for refractory nausea / vomiting. 30 tablet 2  . prochlorperazine (COMPAZINE) 10 MG tablet Take 1 tablet (10 mg total) by mouth every 6 (six) hours as needed (Nausea or vomiting). 60 tablet 2  . sucralfate (CARAFATE) 1 g tablet Take 1 tablet (1 g total) by mouth 3 (three) times daily before meals. 90 tablet 3  . traMADol (ULTRAM) 50 MG tablet Take 1 tablet (50 mg total) by mouth 2 (two) times daily. 60 tablet 0   No current facility-administered medications for this visit.     OBJECTIVE: Vitals:   10/01/17 0918 10/01/17 0921  BP:  102/68  Pulse:  73  Resp: 12   Temp:  98.4 F (36.9 C)     Body mass index is 21.52 kg/m.    ECOG FS:0 - Asymptomatic  General: Well-developed, well-nourished, no acute distress. Eyes: Pink conjunctiva, anicteric sclera. Lungs: Clear to auscultation bilaterally. Heart: Regular rate and rhythm. No rubs, murmurs, or gallops. Abdomen: Soft, nontender, nondistended. No organomegaly noted, normoactive bowel sounds. Musculoskeletal: No edema, cyanosis, or clubbing. Neuro: Alert, answering all questions appropriately. Cranial nerves grossly intact. Skin: No rashes or petechiae noted. Psych: Normal affect.   LAB RESULTS:  Lab Results  Component Value Date   NA 137 10/01/2017   K 3.2 (L) 10/01/2017   CL 106 10/01/2017   CO2 24 10/01/2017   GLUCOSE 195 (H) 10/01/2017   BUN 20 10/01/2017   CREATININE 0.67 10/01/2017   CALCIUM 9.1 10/01/2017   PROT 6.7 10/01/2017   ALBUMIN 3.8 10/01/2017   AST 25 10/01/2017   ALT 12 (L) 10/01/2017   ALKPHOS 53 10/01/2017   BILITOT 0.3 10/01/2017   GFRNONAA >60 10/01/2017   GFRAA >60 10/01/2017    Lab Results  Component Value Date   WBC 2.7 (L) 10/01/2017   NEUTROABS 2.0 10/01/2017   HGB 10.6 (L) 10/01/2017   HCT 31.0 (L) 10/01/2017   MCV 92.9 10/01/2017   PLT 264 10/01/2017       STUDIES: No results found.  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  PLAN:    1. 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  Patient will receive concurrent radiation and chemotherapy with weekly carboplatinum and Taxol.  Given the stage of disease, she will also benefit from maintenance immunotherapy with Imfinzi every 2 weeks for 12 months. Continue daily XRT completing on October 09, 2017.  Proceed with cycle 4 of weekly carboplatinum and Taxol. Return to clinic in 1 week for consideration of cycle 5. 2. Leukopenia: Mild, monitor. 3. Poor appetite: Continue Megace as prescribed. 4.  Anemia: Mild, monitor.  Approximately 30 minutes was spent in discussion of which greater than 50% was consultation.  Patient expressed understanding and was in agreement with this plan. She  also understands that She can call clinic at any time with any questions, concerns, or complaints.   Cancer Staging Cancer of upper lobe of right lung [facility]) Staging form: Lung, AJCC 8th Edition - Clinical stage from 08/24/2017: Stage IIIB (cT2a, cN3, cM0) - Signed by Lloyd Huger, MD on 08/24/2017   Lloyd Huger, MD   10/03/2017 1:25 PM

## 2017-09-29 ENCOUNTER — Ambulatory Visit
Admission: RE | Admit: 2017-09-29 | Discharge: 2017-09-29 | Disposition: A | Discharge status: Home / Self Care | Payer: Managed Care, Other (non HMO) | Source: Ambulatory Visit | Attending: Radiation Oncology | Admitting: Radiation Oncology

## 2017-09-29 DIAGNOSIS — C3411 Malignant neoplasm of upper lobe, right bronchus or lung: Secondary | ICD-10-CM | POA: Diagnosis not present

## 2017-09-30 ENCOUNTER — Ambulatory Visit
Admission: RE | Admit: 2017-09-30 | Discharge: 2017-09-30 | Disposition: A | Discharge status: Home / Self Care | Payer: Managed Care, Other (non HMO) | Source: Ambulatory Visit | Attending: Radiation Oncology | Admitting: Radiation Oncology

## 2017-09-30 DIAGNOSIS — C3411 Malignant neoplasm of upper lobe, right bronchus or lung: Secondary | ICD-10-CM | POA: Diagnosis not present

## 2017-10-01 ENCOUNTER — Encounter: Payer: Self-pay | Admitting: Oncology

## 2017-10-01 ENCOUNTER — Ambulatory Visit
Admission: RE | Admit: 2017-10-01 | Discharge: 2017-10-01 | Disposition: A | Discharge status: Home / Self Care | Payer: Managed Care, Other (non HMO) | Source: Ambulatory Visit | Attending: Radiation Oncology | Admitting: Radiation Oncology

## 2017-10-01 ENCOUNTER — Inpatient Hospital Stay (HOSPITAL_BASED_OUTPATIENT_CLINIC_OR_DEPARTMENT_OTHER): Payer: Managed Care, Other (non HMO) | Admitting: Oncology

## 2017-10-01 ENCOUNTER — Inpatient Hospital Stay: Payer: Managed Care, Other (non HMO)

## 2017-10-01 ENCOUNTER — Other Ambulatory Visit: Payer: Self-pay

## 2017-10-01 VITALS — BP 102/68 | HR 73 | Temp 98.4°F | Resp 12 | Ht 60.0 in | Wt 110.2 lb

## 2017-10-01 DIAGNOSIS — M1990 Unspecified osteoarthritis, unspecified site: Secondary | ICD-10-CM

## 2017-10-01 DIAGNOSIS — E785 Hyperlipidemia, unspecified: Secondary | ICD-10-CM

## 2017-10-01 DIAGNOSIS — I10 Essential (primary) hypertension: Secondary | ICD-10-CM | POA: Diagnosis not present

## 2017-10-01 DIAGNOSIS — C3411 Malignant neoplasm of upper lobe, right bronchus or lung: Secondary | ICD-10-CM

## 2017-10-01 DIAGNOSIS — D72819 Decreased white blood cell count, unspecified: Secondary | ICD-10-CM | POA: Diagnosis not present

## 2017-10-01 DIAGNOSIS — F419 Anxiety disorder, unspecified: Secondary | ICD-10-CM

## 2017-10-01 DIAGNOSIS — R630 Anorexia: Secondary | ICD-10-CM | POA: Diagnosis not present

## 2017-10-01 DIAGNOSIS — Z79899 Other long term (current) drug therapy: Secondary | ICD-10-CM

## 2017-10-01 DIAGNOSIS — Z87891 Personal history of nicotine dependence: Secondary | ICD-10-CM

## 2017-10-01 DIAGNOSIS — Z803 Family history of malignant neoplasm of breast: Secondary | ICD-10-CM

## 2017-10-01 DIAGNOSIS — E039 Hypothyroidism, unspecified: Secondary | ICD-10-CM | POA: Diagnosis not present

## 2017-10-01 DIAGNOSIS — D649 Anemia, unspecified: Secondary | ICD-10-CM

## 2017-10-01 LAB — CBC WITH DIFFERENTIAL/PLATELET
Basophils Absolute: 0 10*3/uL (ref 0–0.1)
Basophils Relative: 1 %
EOS PCT: 1 %
Eosinophils Absolute: 0 10*3/uL (ref 0–0.7)
HEMATOCRIT: 31 % — AB (ref 35.0–47.0)
Hemoglobin: 10.6 g/dL — ABNORMAL LOW (ref 12.0–16.0)
LYMPHS ABS: 0.5 10*3/uL — AB (ref 1.0–3.6)
LYMPHS PCT: 17 %
MCH: 31.9 pg (ref 26.0–34.0)
MCHC: 34.3 g/dL (ref 32.0–36.0)
MCV: 92.9 fL (ref 80.0–100.0)
MONO ABS: 0.2 10*3/uL (ref 0.2–0.9)
Monocytes Relative: 6 %
NEUTROS ABS: 2 10*3/uL (ref 1.4–6.5)
Neutrophils Relative %: 75 %
PLATELETS: 264 10*3/uL (ref 150–440)
RBC: 3.34 MIL/uL — AB (ref 3.80–5.20)
RDW: 13.1 % (ref 11.5–14.5)
WBC: 2.7 10*3/uL — AB (ref 3.6–11.0)

## 2017-10-01 LAB — COMPREHENSIVE METABOLIC PANEL
ALBUMIN: 3.8 g/dL (ref 3.5–5.0)
ALT: 12 U/L — ABNORMAL LOW (ref 14–54)
ANION GAP: 7 (ref 5–15)
AST: 25 U/L (ref 15–41)
Alkaline Phosphatase: 53 U/L (ref 38–126)
BUN: 20 mg/dL (ref 6–20)
CHLORIDE: 106 mmol/L (ref 101–111)
CO2: 24 mmol/L (ref 22–32)
Calcium: 9.1 mg/dL (ref 8.9–10.3)
Creatinine, Ser: 0.67 mg/dL (ref 0.44–1.00)
GFR calc Af Amer: >60 mL/min (ref >60)
GFR calc non Af Amer: >60 mL/min (ref >60)
GLUCOSE: 195 mg/dL — AB (ref 65–99)
Potassium: 3.2 mmol/L — ABNORMAL LOW (ref 3.5–5.1)
Sodium: 137 mmol/L (ref 135–145)
Total Bilirubin: 0.3 mg/dL (ref 0.3–1.2)
Total Protein: 6.7 g/dL (ref 6.5–8.1)

## 2017-10-01 MED ORDER — SODIUM CHLORIDE 0.9 % IV SOLN
Freq: Once | INTRAVENOUS | Status: AC
  Administered 2017-10-01: 10:00:00 via INTRAVENOUS
  Filled 2017-10-01: qty 1000

## 2017-10-01 MED ORDER — SODIUM CHLORIDE 0.9 % IV SOLN
Freq: Once | INTRAVENOUS | Status: AC
  Administered 2017-10-01: 10:00:00 via INTRAVENOUS
  Filled 2017-10-01: qty 100

## 2017-10-01 MED ORDER — DIPHENHYDRAMINE HCL 50 MG/ML IJ SOLN
25.0000 mg | Freq: Once | INTRAMUSCULAR | Status: AC
  Administered 2017-10-01: 25 mg via INTRAVENOUS
  Filled 2017-10-01: qty 1

## 2017-10-01 MED ORDER — SODIUM CHLORIDE 0.9 % IV SOLN
45.0000 mg/m2 | Freq: Once | INTRAVENOUS | Status: AC
  Administered 2017-10-01: 66 mg via INTRAVENOUS
  Filled 2017-10-01: qty 11

## 2017-10-01 MED ORDER — DEXAMETHASONE SODIUM PHOSPHATE 10 MG/ML IJ SOLN
10.0000 mg | Freq: Once | INTRAMUSCULAR | Status: AC
  Administered 2017-10-01: 10 mg via INTRAVENOUS
  Filled 2017-10-01: qty 1

## 2017-10-01 MED ORDER — SODIUM CHLORIDE 0.9 % IV SOLN: 10.0000 mg | Freq: Once | INTRAVENOUS | Status: DC

## 2017-10-01 MED ORDER — HEPARIN SOD (PORK) LOCK FLUSH 100 UNIT/ML IV SOLN
INTRAVENOUS | Status: AC
  Filled 2017-10-01: qty 5

## 2017-10-01 MED ORDER — SODIUM CHLORIDE 0.9 % IV SOLN
173.2000 mg | Freq: Once | INTRAVENOUS | Status: AC
  Administered 2017-10-01: 170 mg via INTRAVENOUS
  Filled 2017-10-01: qty 17

## 2017-10-01 MED ORDER — HEPARIN SOD (PORK) LOCK FLUSH 100 UNIT/ML IV SOLN
500.0000 [IU] | Freq: Once | INTRAVENOUS | Status: AC | PRN
  Administered 2017-10-01: 500 [IU]

## 2017-10-01 MED ORDER — FAMOTIDINE IN NACL 20-0.9 MG/50ML-% IV SOLN
20.0000 mg | Freq: Once | INTRAVENOUS | Status: DC
  Filled 2017-10-01: qty 50

## 2017-10-01 MED ORDER — PALONOSETRON HCL INJECTION 0.25 MG/5ML
0.2500 mg | Freq: Once | INTRAVENOUS | Status: AC
  Administered 2017-10-01: 0.25 mg via INTRAVENOUS
  Filled 2017-10-01: qty 5

## 2017-10-01 NOTE — Progress Notes (Signed)
Patient here for pre treatment check. No changes since last appt.

## 2017-10-02 ENCOUNTER — Ambulatory Visit
Admission: RE | Admit: 2017-10-02 | Discharge: 2017-10-02 | Disposition: A | Discharge status: Home / Self Care | Payer: Managed Care, Other (non HMO) | Source: Ambulatory Visit | Attending: Radiation Oncology | Admitting: Radiation Oncology

## 2017-10-02 DIAGNOSIS — C3411 Malignant neoplasm of upper lobe, right bronchus or lung: Secondary | ICD-10-CM | POA: Diagnosis not present

## 2017-10-03 ENCOUNTER — Ambulatory Visit
Admission: RE | Admit: 2017-10-03 | Discharge: 2017-10-03 | Disposition: A | Discharge status: Home / Self Care | Payer: Managed Care, Other (non HMO) | Source: Ambulatory Visit | Attending: Radiation Oncology | Admitting: Radiation Oncology

## 2017-10-03 ENCOUNTER — Ambulatory Visit: Payer: Managed Care, Other (non HMO) | Admitting: Unknown Physician Specialty

## 2017-10-03 DIAGNOSIS — C3411 Malignant neoplasm of upper lobe, right bronchus or lung: Secondary | ICD-10-CM | POA: Insufficient documentation

## 2017-10-03 DIAGNOSIS — Z510 Encounter for antineoplastic radiation therapy: Secondary | ICD-10-CM | POA: Diagnosis not present

## 2017-10-03 DIAGNOSIS — Z87891 Personal history of nicotine dependence: Secondary | ICD-10-CM | POA: Diagnosis not present

## 2017-10-04 NOTE — Progress Notes (Signed)
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  INTERVAL HISTORY: Patient returns to clinic today for further evaluation and consideration of cycle 5 of weekly carboplatinum and Taxol.  She continues to tolerate her treatments well without significant side effects. She is also tolerating daily XRT. She has no neurologic complaints.  She denies any recent fevers or illnesses. She denies any chest pain, shortness of breath, hemoptysis, or cough.  She has no nausea, vomiting, constipation, or diarrhea.  She has no melena or hematochezia.  Patient offers no further specific complaints today.  REVIEW OF SYSTEMS:   Review of Systems  Constitutional: Negative.  Negative for fever, malaise/fatigue and weight loss.  Respiratory: Negative.  Negative for cough and shortness of breath.   Cardiovascular: Negative.  Negative for chest pain and leg swelling.  Gastrointestinal: Negative.  Negative for abdominal pain, blood in stool and melena.  Genitourinary: Negative.  Negative for dysuria.  Musculoskeletal: Negative.   Skin: Negative.  Negative for rash.  Neurological: Negative.  Negative for sensory change and weakness.  Psychiatric/Behavioral: Negative.  The patient is not nervous/anxious.     As per HPI. Otherwise, a complete review of systems is negative.  PAST MEDICAL HISTORY: Past Medical History:  Diagnosis Date  . Arthritis    right shoulder  . Cancer ([place])    right lung  . Hyperlipidemia   . Hypertension   . Hypothyroidism   . Menopausal state   . Thyroid disease     PAST SURGICAL HISTORY: Past Surgical History:  Procedure Laterality Date  . ABDOMINAL HYSTERECTOMY  2005  . BREAST  EXCISIONAL BIOPSY Right 1979  . COLONOSCOPY WITH PROPOFOL N/A 05/13/2017   Procedure: COLONOSCOPY WITH PROPOFOL;  Surgeon: [name], MD;  Location: [facility] ENDOSCOPY;  Service: Gastroenterology;  Laterality: N/A;  . CYSTECTOMY Right    breast  . CYSTECTOMY  02/2015   back of neck  . ESOPHAGOGASTRODUODENOSCOPY (EGD) WITH PROPOFOL N/A 05/13/2017   Procedure: ESOPHAGOGASTRODUODENOSCOPY (EGD) WITH PROPOFOL;  Surgeon: [name], MD;  Location: [facility] ENDOSCOPY;  Service: Gastroenterology;  Laterality: N/A;  . PORTA CATH INSERTION N/A 09/01/2017   Procedure: PORTA CATH INSERTION;  Surgeon: Algernon Huxley, MD;  Location: [place] CV LAB;  Service: Cardiovascular;  Laterality: N/A;  . TUBAL LIGATION  1986    FAMILY HISTORY: Family History  Problem Relation Age of Onset  . Hypertension Mother   . Stroke Mother   . Leukemia Mother   . Stroke Father   . Pneumonia Father   . Diabetes Sister   . Hyperlipidemia Sister   . Breast cancer Maternal Aunt 20    ADVANCED DIRECTIVES (Y/N):  N  HEALTH MAINTENANCE: Social History   Tobacco Use  . Smoking status: Former Smoker    Packs/day: 0.25    Types: Cigarettes    Last attempt to quit: 02/01/2017    Years since quitting: 0.6  . Smokeless tobacco: Never Used  Substance Use Topics  . Alcohol use: No    Alcohol/week: 0.0 oz  . Drug use: No     Colonoscopy:  PAP:  Bone density:  Lipid panel:  No Known Allergies  Current Outpatient Medications  Medication Sig Dispense Refill  . amLODipine (NORVASC) 5 MG tablet TAKE 1  TABLET DAILY 90 tablet 1  . atorvastatin (LIPITOR) 10 MG tablet TAKE 1 TABLET DAILY AT 6 P.M. 90 tablet 1  . levothyroxine (SYNTHROID, LEVOTHROID) 50 MCG tablet Take 1 tablet (50 mcg total) by mouth daily. 90 tablet 3  . lidocaine-prilocaine (EMLA) cream Apply to affected area once 30 g 3  . lisinopril (PRINIVIL,ZESTRIL) 20 MG tablet TAKE 1 TABLET DAILY 90 tablet 1  . megestrol (MEGACE) 40 MG tablet Take 1 tablet (40 mg  total) by mouth daily. 30 tablet 2  . ondansetron (ZOFRAN) 8 MG tablet Take 1 tablet (8 mg total) by mouth 2 (two) times daily as needed for refractory nausea / vomiting. 30 tablet 2  . prochlorperazine (COMPAZINE) 10 MG tablet Take 1 tablet (10 mg total) by mouth every 6 (six) hours as needed (Nausea or vomiting). 60 tablet 2  . sucralfate (CARAFATE) 1 g tablet Take 1 tablet (1 g total) by mouth 3 (three) times daily before meals. 90 tablet 3  . traMADol (ULTRAM) 50 MG tablet Take 1 tablet (50 mg total) by mouth 2 (two) times daily. 60 tablet 0   No current facility-administered medications for this visit.     OBJECTIVE: Vitals:   10/07/17 1053  BP: 119/81  Pulse: 96  Resp: 18  Temp: 98.7 F (37.1 C)  SpO2: 98%     Body mass index is 21.39 kg/m.    ECOG FS:0 - Asymptomatic  General: Well-developed, well-nourished, no acute distress. Eyes: Pink conjunctiva, anicteric sclera. Lungs: Clear to auscultation bilaterally. Heart: Regular rate and rhythm. No rubs, murmurs, or gallops. Abdomen: Soft, nontender, nondistended. No organomegaly noted, normoactive bowel sounds. Musculoskeletal: No edema, cyanosis, or clubbing. Neuro: Alert, answering all questions appropriately. Cranial nerves grossly intact. Skin: No rashes or petechiae noted. Psych: Normal affect.   LAB RESULTS:  Lab Results  Component Value Date   NA 139 10/07/2017   K 3.5 10/07/2017   CL 105 10/07/2017   CO2 25 10/07/2017   GLUCOSE 85 10/07/2017   BUN 12 10/07/2017   CREATININE 0.51 10/07/2017   CALCIUM 9.1 10/07/2017   PROT 6.7 10/07/2017   ALBUMIN 3.7 10/07/2017   AST 16 10/07/2017   ALT 13 (L) 10/07/2017   ALKPHOS 52 10/07/2017   BILITOT 0.5 10/07/2017   GFRNONAA >60 10/07/2017   GFRAA >60 10/07/2017    Lab Results  Component Value Date   WBC 2.6 (L) 10/07/2017   NEUTROABS 1.9 10/07/2017   HGB 10.8 (L) 10/07/2017   HCT 31.7 (L) 10/07/2017   MCV 92.6 10/07/2017   PLT 246 10/07/2017      STUDIES: No results found.  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  PLAN:    1. 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  Patient will receive concurrent radiation and chemotherapy with weekly carboplatinum and Taxol.  Given the stage of disease, she will also benefit from maintenance immunotherapy with Imfinzi every 2 weeks for 12 months. Continue daily XRT completing on October 09, 2017.  Proceed with cycle 5 of weekly carboplatinum and Taxol. Return to clinic in 3 weeks for further evaluation and consideration of cycle 1 of Imfinzi.  2. Leukopenia: Mild, monitor. 3. Poor appetite: Continue Megace as prescribed. 4.  Anemia: Mild, monitor.  Approximately 30 minutes was spent in discussion of which greater than 50% was consultation.  Patient expressed understanding and was in agreement with this plan. She  also understands that She can call clinic at any time with any questions, concerns, or complaints.   Cancer Staging Cancer of upper lobe of right lung [facility]) Staging form: Lung, AJCC 8th Edition - Clinical stage from 08/24/2017: Stage IIIB (cT2a, cN3, cM0) - Signed by Lloyd Huger, MD on 08/24/2017   Lloyd Huger, MD   10/10/2017 9:09 AM

## 2017-10-06 ENCOUNTER — Ambulatory Visit
Admission: RE | Admit: 2017-10-06 | Discharge: 2017-10-06 | Disposition: A | Discharge status: Home / Self Care | Payer: Managed Care, Other (non HMO) | Source: Ambulatory Visit | Attending: Radiation Oncology | Admitting: Radiation Oncology

## 2017-10-06 DIAGNOSIS — C3411 Malignant neoplasm of upper lobe, right bronchus or lung: Secondary | ICD-10-CM | POA: Diagnosis not present

## 2017-10-07 ENCOUNTER — Ambulatory Visit
Admission: RE | Admit: 2017-10-07 | Discharge: 2017-10-07 | Disposition: A | Discharge status: Home / Self Care | Payer: Managed Care, Other (non HMO) | Source: Ambulatory Visit | Attending: Radiation Oncology | Admitting: Radiation Oncology

## 2017-10-07 ENCOUNTER — Inpatient Hospital Stay: Payer: Managed Care, Other (non HMO)

## 2017-10-07 ENCOUNTER — Encounter: Payer: Self-pay | Admitting: Oncology

## 2017-10-07 ENCOUNTER — Inpatient Hospital Stay: Payer: Managed Care, Other (non HMO) | Attending: Oncology

## 2017-10-07 ENCOUNTER — Inpatient Hospital Stay (HOSPITAL_BASED_OUTPATIENT_CLINIC_OR_DEPARTMENT_OTHER): Payer: Managed Care, Other (non HMO) | Admitting: Oncology

## 2017-10-07 VITALS — BP 119/81 | HR 96 | Temp 98.7°F | Resp 18 | Ht 60.0 in | Wt 109.5 lb

## 2017-10-07 DIAGNOSIS — Z5111 Encounter for antineoplastic chemotherapy: Secondary | ICD-10-CM | POA: Insufficient documentation

## 2017-10-07 DIAGNOSIS — Z806 Family history of leukemia: Secondary | ICD-10-CM | POA: Diagnosis not present

## 2017-10-07 DIAGNOSIS — E039 Hypothyroidism, unspecified: Secondary | ICD-10-CM | POA: Insufficient documentation

## 2017-10-07 DIAGNOSIS — E785 Hyperlipidemia, unspecified: Secondary | ICD-10-CM

## 2017-10-07 DIAGNOSIS — D649 Anemia, unspecified: Secondary | ICD-10-CM | POA: Insufficient documentation

## 2017-10-07 DIAGNOSIS — D72819 Decreased white blood cell count, unspecified: Secondary | ICD-10-CM | POA: Diagnosis not present

## 2017-10-07 DIAGNOSIS — M19011 Primary osteoarthritis, right shoulder: Secondary | ICD-10-CM | POA: Insufficient documentation

## 2017-10-07 DIAGNOSIS — Z803 Family history of malignant neoplasm of breast: Secondary | ICD-10-CM | POA: Diagnosis not present

## 2017-10-07 DIAGNOSIS — R630 Anorexia: Secondary | ICD-10-CM | POA: Insufficient documentation

## 2017-10-07 DIAGNOSIS — Z87891 Personal history of nicotine dependence: Secondary | ICD-10-CM | POA: Diagnosis not present

## 2017-10-07 DIAGNOSIS — Z79899 Other long term (current) drug therapy: Secondary | ICD-10-CM | POA: Diagnosis not present

## 2017-10-07 DIAGNOSIS — C3411 Malignant neoplasm of upper lobe, right bronchus or lung: Secondary | ICD-10-CM

## 2017-10-07 DIAGNOSIS — I10 Essential (primary) hypertension: Secondary | ICD-10-CM | POA: Insufficient documentation

## 2017-10-07 LAB — CBC WITH DIFFERENTIAL/PLATELET
BASOS ABS: 0 10*3/uL (ref 0–0.1)
BASOS PCT: 1 %
Eosinophils Absolute: 0 10*3/uL (ref 0–0.7)
Eosinophils Relative: 1 %
HEMATOCRIT: 31.7 % — AB (ref 35.0–47.0)
HEMOGLOBIN: 10.8 g/dL — AB (ref 12.0–16.0)
LYMPHS PCT: 19 %
Lymphs Abs: 0.5 10*3/uL — ABNORMAL LOW (ref 1.0–3.6)
MCH: 31.4 pg (ref 26.0–34.0)
MCHC: 34 g/dL (ref 32.0–36.0)
MCV: 92.6 fL (ref 80.0–100.0)
MONO ABS: 0.2 10*3/uL (ref 0.2–0.9)
MONOS PCT: 6 %
NEUTROS ABS: 1.9 10*3/uL (ref 1.4–6.5)
Neutrophils Relative %: 73 %
Platelets: 246 10*3/uL (ref 150–440)
RBC: 3.42 MIL/uL — ABNORMAL LOW (ref 3.80–5.20)
RDW: 13.3 % (ref 11.5–14.5)
WBC: 2.6 10*3/uL — ABNORMAL LOW (ref 3.6–11.0)

## 2017-10-07 LAB — COMPREHENSIVE METABOLIC PANEL
ALBUMIN: 3.7 g/dL (ref 3.5–5.0)
ALT: 13 U/L — ABNORMAL LOW (ref 14–54)
ANION GAP: 9 (ref 5–15)
AST: 16 U/L (ref 15–41)
Alkaline Phosphatase: 52 U/L (ref 38–126)
BUN: 12 mg/dL (ref 6–20)
CALCIUM: 9.1 mg/dL (ref 8.9–10.3)
CO2: 25 mmol/L (ref 22–32)
Chloride: 105 mmol/L (ref 101–111)
Creatinine, Ser: 0.51 mg/dL (ref 0.44–1.00)
GFR calc Af Amer: >60 mL/min (ref >60)
GFR calc non Af Amer: >60 mL/min (ref >60)
GLUCOSE: 85 mg/dL (ref 65–99)
POTASSIUM: 3.5 mmol/L (ref 3.5–5.1)
SODIUM: 139 mmol/L (ref 135–145)
TOTAL PROTEIN: 6.7 g/dL (ref 6.5–8.1)
Total Bilirubin: 0.5 mg/dL (ref 0.3–1.2)

## 2017-10-07 MED ORDER — FAMOTIDINE IN NACL 20-0.9 MG/50ML-% IV SOLN: 20.0000 mg | Freq: Once | INTRAVENOUS | Status: DC

## 2017-10-07 MED ORDER — DIPHENHYDRAMINE HCL 50 MG/ML IJ SOLN
25.0000 mg | Freq: Once | INTRAMUSCULAR | Status: AC
  Administered 2017-10-07: 25 mg via INTRAVENOUS
  Filled 2017-10-07: qty 1

## 2017-10-07 MED ORDER — SODIUM CHLORIDE 0.9 % IV SOLN
173.2000 mg | Freq: Once | INTRAVENOUS | Status: AC
  Administered 2017-10-07: 170 mg via INTRAVENOUS
  Filled 2017-10-07: qty 17

## 2017-10-07 MED ORDER — SODIUM CHLORIDE 0.9 % IV SOLN
45.0000 mg/m2 | Freq: Once | INTRAVENOUS | Status: AC
  Administered 2017-10-07: 66 mg via INTRAVENOUS
  Filled 2017-10-07: qty 11

## 2017-10-07 MED ORDER — SODIUM CHLORIDE 0.9% FLUSH
10.0000 mL | Freq: Once | INTRAVENOUS | Status: AC
  Administered 2017-10-07: 10 mL via INTRAVENOUS
  Filled 2017-10-07: qty 10

## 2017-10-07 MED ORDER — SODIUM CHLORIDE 0.9 % IV SOLN
20.0000 mg | Freq: Once | INTRAVENOUS | Status: AC
  Administered 2017-10-07: 20 mg via INTRAVENOUS
  Filled 2017-10-07: qty 100

## 2017-10-07 MED ORDER — SODIUM CHLORIDE 0.9 % IV SOLN
Freq: Once | INTRAVENOUS | Status: AC
  Administered 2017-10-07: 12:00:00 via INTRAVENOUS
  Filled 2017-10-07: qty 1000

## 2017-10-07 MED ORDER — PALONOSETRON HCL INJECTION 0.25 MG/5ML
0.2500 mg | Freq: Once | INTRAVENOUS | Status: AC
  Administered 2017-10-07: 0.25 mg via INTRAVENOUS
  Filled 2017-10-07: qty 5

## 2017-10-07 MED ORDER — HEPARIN SOD (PORK) LOCK FLUSH 100 UNIT/ML IV SOLN: 500.0000 [IU] | Freq: Once | INTRAVENOUS | Status: DC | PRN

## 2017-10-07 MED ORDER — DEXAMETHASONE SODIUM PHOSPHATE 10 MG/ML IJ SOLN
10.0000 mg | Freq: Once | INTRAMUSCULAR | Status: AC
  Administered 2017-10-07: 10 mg via INTRAVENOUS
  Filled 2017-10-07: qty 1

## 2017-10-07 MED ORDER — HEPARIN SOD (PORK) LOCK FLUSH 100 UNIT/ML IV SOLN
500.0000 [IU] | Freq: Once | INTRAVENOUS | Status: AC
  Administered 2017-10-07: 500 [IU] via INTRAVENOUS
  Filled 2017-10-07: qty 5

## 2017-10-07 NOTE — Progress Notes (Signed)
No new changes noted today 

## 2017-10-08 ENCOUNTER — Ambulatory Visit: Payer: Managed Care, Other (non HMO)

## 2017-10-08 ENCOUNTER — Ambulatory Visit
Admission: RE | Admit: 2017-10-08 | Discharge: 2017-10-08 | Disposition: A | Discharge status: Home / Self Care | Payer: Managed Care, Other (non HMO) | Source: Ambulatory Visit | Attending: Radiation Oncology | Admitting: Radiation Oncology

## 2017-10-08 DIAGNOSIS — C3411 Malignant neoplasm of upper lobe, right bronchus or lung: Secondary | ICD-10-CM | POA: Diagnosis not present

## 2017-10-09 ENCOUNTER — Ambulatory Visit
Admission: RE | Admit: 2017-10-09 | Discharge: 2017-10-09 | Disposition: A | Discharge status: Home / Self Care | Payer: Managed Care, Other (non HMO) | Source: Ambulatory Visit | Attending: Radiation Oncology | Admitting: Radiation Oncology

## 2017-10-09 ENCOUNTER — Inpatient Hospital Stay: Payer: Managed Care, Other (non HMO)

## 2017-10-09 DIAGNOSIS — C3411 Malignant neoplasm of upper lobe, right bronchus or lung: Secondary | ICD-10-CM | POA: Diagnosis not present

## 2017-10-09 NOTE — Progress Notes (Signed)
Nutrition Follow-up:  Patient with lung cancer currently receiving chemotherapy and radiation therapy.  Last radiation therapy treatment due today.    Met with patient in clinic prior to radiation therapy today.  Patient reports having a hard time swallowing pills and foods.  Reports that radiation oncologist told her that she has a mass effecting her swallowing. Reports that both doctors told her that her swallowing issues would improve in a few weeks.  Reports that she is drinking 1-2 ensure per day.  Yesterday ate crackers, pork and beans and ice cream.  Also drank ensure, water and juice.  Reports after our initial meeting she was doing well, eating and drinking but then swallowing got worse. Patient seems frustrated today  Medications: carafate, megace  Labs: reviewed  Anthropometrics:   WEight has decreased to 109 lb 8 oz on 3/5 from 115 lb 4.8 oz on 2/11   NUTRITION DIAGNOSIS: Malnutrition continues   MALNUTRITION DIAGNOSIS: Severe malnutrition continues   INTERVENTION:  Encouraged patient to drink at least 3 ensure plus daily to better meet nutritional needs.  Discussed with patient she will still need to eat soft foods as well to meet 100% of nutritional needs. 2nd complimentary case of ensure plus given to patient today. Discussed chopping, grinding, pureeing foods in blender to make them smoother and easier to swallow.  Discussed ways to add moisture, liquids to foods May benefit from SLP evaluation, if swallowing does not improve     MONITORING, EVALUATION, GOAL: weight trends, intake   NEXT VISIT: phone follow-up in 1 month  Manus Weedman B. Zenia Resides, [place], [place] Registered Dietitian [phone] (pager)

## 2017-10-17 ENCOUNTER — Encounter: Payer: Self-pay | Admitting: Oncology

## 2017-10-20 ENCOUNTER — Other Ambulatory Visit: Payer: Self-pay

## 2017-10-20 ENCOUNTER — Encounter: Payer: Self-pay | Admitting: Radiation Oncology

## 2017-10-20 ENCOUNTER — Ambulatory Visit
Admission: RE | Admit: 2017-10-20 | Discharge: 2017-10-20 | Disposition: A | Discharge status: Home / Self Care | Payer: Managed Care, Other (non HMO) | Source: Ambulatory Visit | Attending: Radiation Oncology | Admitting: Radiation Oncology

## 2017-10-20 ENCOUNTER — Other Ambulatory Visit: Payer: Self-pay | Admitting: Unknown Physician Specialty

## 2017-10-20 VITALS — BP 113/66 | HR 79 | Temp 96.8°F | Resp 20 | Wt 107.9 lb

## 2017-10-20 DIAGNOSIS — C3411 Malignant neoplasm of upper lobe, right bronchus or lung: Secondary | ICD-10-CM | POA: Diagnosis not present

## 2017-10-20 NOTE — Progress Notes (Signed)
Radiation Oncology Follow up Note  Name: Janet [name]   Date:   10/20/2017 MRN:  [id number] DOB: 12/03/1955    This 62 y.o. female presents to the clinic today for reevaluation status post initial course of radiation for stage IIIB adenocarcinoma the right upper lobe.  REFERRING PROVIDER: Kathrine Haddock, NP  HPI: patient is a 62 year old female now seen out 1 week after completing initial large field chest radiation therapy for stage IIIB (T3 N3 M0) adenocarcinoma the right upper lobe. Seen today in routine follow-up she is doing well. She specifically denies hemoptysis cough or dysphagia..  COMPLICATIONS OF TREATMENT: none  FOLLOW UP COMPLIANCE: keeps appointments   PHYSICAL EXAM:  BP 113/66   Pulse 79   Temp (!) 96.8 F (36 C)   Resp 20   Wt 107 lb 14.6 oz (48.9 kg)   LMP 08/08/2003 (Approximate) Comment: Hysterectomy 2004  BMI 21.08 kg/m  Well-developed well-nourished patient in NAD. HEENT reveals PERLA, EOMI, discs not visualized.  Oral cavity is clear. No oral mucosal lesions are identified. Neck is clear without evidence of cervical or supraclavicular adenopathy. Lungs are clear to A&P. Cardiac examination is essentially unremarkable with regular rate and rhythm without murmur rub or thrill. Abdomen is benign with no organomegaly or masses noted. Motor sensory and DTR levels are equal and symmetric in the upper and lower extremities. Cranial nerves II through XII are grossly intact. Proprioception is intact. No peripheral adenopathy or edema is identified. No motor or sensory levels are noted. Crude visual fields are within normal range.  RADIOLOGY RESULTS: no current films for review  PLAN: the present time I like to re-CT scan the patient to evaluate her response. If we see significant response would like to go ahead with small field radiation therapy.would plan on delivering 2600 cGy in 13 fractions. Risks and benefits of further radiation therapy were discussed again with  the patient and she comprehend her treatment plan well. I personally set up and ordered CT simulation for small field lung boost later this week and will reevaluate those results with her.  I would like to take this opportunity to thank you for allowing me to participate in the care of your patient.Noreene Filbert, MD

## 2017-10-22 ENCOUNTER — Other Ambulatory Visit: Payer: Self-pay | Admitting: Oncology

## 2017-10-22 DIAGNOSIS — C3411 Malignant neoplasm of upper lobe, right bronchus or lung: Secondary | ICD-10-CM

## 2017-10-23 ENCOUNTER — Ambulatory Visit
Admission: RE | Admit: 2017-10-23 | Discharge: 2017-10-23 | Disposition: A | Discharge status: Home / Self Care | Payer: Managed Care, Other (non HMO) | Source: Ambulatory Visit | Attending: Radiation Oncology | Admitting: Radiation Oncology

## 2017-10-23 DIAGNOSIS — C3411 Malignant neoplasm of upper lobe, right bronchus or lung: Secondary | ICD-10-CM | POA: Diagnosis not present

## 2017-10-26 NOTE — Progress Notes (Deleted)
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  INTERVAL HISTORY: Patient returns to clinic today for further evaluation and consideration of cycle 5 of weekly carboplatinum and Taxol.  She continues to tolerate her treatments well without significant side effects. She is also tolerating daily XRT. She has no neurologic complaints.  She denies any recent fevers or illnesses. She denies any chest pain, shortness of breath, hemoptysis, or cough.  She has no nausea, vomiting, constipation, or diarrhea.  She has no melena or hematochezia.  Patient offers no further specific complaints today.  REVIEW OF SYSTEMS:   Review of Systems  Constitutional: Negative.  Negative for fever, malaise/fatigue and weight loss.  Respiratory: Negative.  Negative for cough and shortness of breath.   Cardiovascular: Negative.  Negative for chest pain and leg swelling.  Gastrointestinal: Negative.  Negative for abdominal pain, blood in stool and melena.  Genitourinary: Negative.  Negative for dysuria.  Musculoskeletal: Negative.   Skin: Negative.  Negative for rash.  Neurological: Negative.  Negative for sensory change and weakness.  Psychiatric/Behavioral: Negative.  The patient is not nervous/anxious.     As per HPI. Otherwise, a complete review of systems is negative.  PAST MEDICAL HISTORY: Past Medical History:  Diagnosis Date  . Arthritis    right shoulder  . Cancer ([place])    right lung  . Hyperlipidemia   . Hypertension   . Hypothyroidism   . Menopausal state   . Thyroid disease     PAST SURGICAL HISTORY: Past Surgical History:  Procedure Laterality Date  . ABDOMINAL HYSTERECTOMY  2005  . BREAST  EXCISIONAL BIOPSY Right 1979  . COLONOSCOPY WITH PROPOFOL N/A 05/13/2017   Procedure: COLONOSCOPY WITH PROPOFOL;  Surgeon: [name], MD;  Location[facility] ENDOSCOPY;  Service: Gastroenterology;  Laterality: N/A;  . CYSTECTOMY Right    breast  . CYSTECTOMY  02/2015   back of neck  . ESOPHAGOGASTRODUODENOSCOPY (EGD) WITH PROPOFOL N/A 05/13/2017   Procedure: ESOPHAGOGASTRODUODENOSCOPY (EGD) WITH PROPOFOL;  Surgeon: [name], MD;  Location: [facility] ENDOSCOPY;  Service: Gastroenterology;  Laterality: N/A;  . PORTA CATH INSERTION N/A 09/01/2017   Procedure: PORTA CATH INSERTION;  Surgeon: Algernon Huxley, MD;  Location: [place] CV LAB;  Service: Cardiovascular;  Laterality: N/A;  . TUBAL LIGATION  1986    FAMILY HISTORY: Family History  Problem Relation Age of Onset  . Hypertension Mother   . Stroke Mother   . Leukemia Mother   . Stroke Father   . Pneumonia Father   . Diabetes Sister   . Hyperlipidemia Sister   . Breast cancer Maternal Aunt 3    ADVANCED DIRECTIVES (Y/N):  N  HEALTH MAINTENANCE: Social History   Tobacco Use  . Smoking status: Former Smoker    Packs/day: 0.25    Types: Cigarettes    Last attempt to quit: 02/01/2017    Years since quitting: 0.7  . Smokeless tobacco: Never Used  Substance Use Topics  . Alcohol use: No    Alcohol/week: 0.0 oz  . Drug use: No     Colonoscopy:  PAP:  Bone density:  Lipid panel:  No Known Allergies  Current Outpatient Medications  Medication Sig Dispense Refill  . amLODipine (NORVASC) 5 MG tablet TAKE 1  TABLET DAILY 90 tablet 1  . atorvastatin (LIPITOR) 10 MG tablet TAKE 1 TABLET DAILY AT 6 P.M. 90 tablet 1  . levothyroxine (SYNTHROID, LEVOTHROID) 50 MCG tablet TAKE 1 TABLET DAILY 90 tablet 3  . lisinopril (PRINIVIL,ZESTRIL) 20 MG tablet TAKE 1 TABLET DAILY 90 tablet 1  . megestrol (MEGACE) 40 MG tablet Take 1 tablet (40 mg total) by mouth daily. 30 tablet 2  . sucralfate (CARAFATE) 1 g tablet Take 1 tablet (1 g total)  by mouth 3 (three) times daily before meals. 90 tablet 3  . traMADol (ULTRAM) 50 MG tablet Take 1 tablet (50 mg total) by mouth 2 (two) times daily. 60 tablet 0   No current facility-administered medications for this visit.     OBJECTIVE: There were no vitals filed for this visit.   There is no height or weight on file to calculate BMI.    ECOG FS:0 - Asymptomatic  General: Well-developed, well-nourished, no acute distress. Eyes: Pink conjunctiva, anicteric sclera. Lungs: Clear to auscultation bilaterally. Heart: Regular rate and rhythm. No rubs, murmurs, or gallops. Abdomen: Soft, nontender, nondistended. No organomegaly noted, normoactive bowel sounds. Musculoskeletal: No edema, cyanosis, or clubbing. Neuro: Alert, answering all questions appropriately. Cranial nerves grossly intact. Skin: No rashes or petechiae noted. Psych: Normal affect.   LAB RESULTS:  Lab Results  Component Value Date   NA 139 10/07/2017   K 3.5 10/07/2017   CL 105 10/07/2017   CO2 25 10/07/2017   GLUCOSE 85 10/07/2017   BUN 12 10/07/2017   CREATININE 0.51 10/07/2017   CALCIUM 9.1 10/07/2017   PROT 6.7 10/07/2017   ALBUMIN 3.7 10/07/2017   AST 16 10/07/2017   ALT 13 (L) 10/07/2017   ALKPHOS 52 10/07/2017   BILITOT 0.5 10/07/2017   GFRNONAA >60 10/07/2017   GFRAA >60 10/07/2017    Lab Results  Component Value Date   WBC 2.6 (L) 10/07/2017   NEUTROABS 1.9 10/07/2017   HGB 10.8 (L) 10/07/2017   HCT 31.7 (L) 10/07/2017   MCV 92.6 10/07/2017   PLT 246 10/07/2017     STUDIES: No results found.  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  PLAN:    1. 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  Patient will receive concurrent radiation and chemotherapy with weekly carboplatinum and Taxol.  Given the stage of disease, she will also benefit from maintenance  immunotherapy with Imfinzi every 2 weeks for 12 months. Continue daily XRT completing on October 09, 2017.  Proceed with cycle 5 of weekly carboplatinum and Taxol. Return to clinic in 3 weeks for further evaluation and consideration of cycle 1 of Imfinzi.  2. Leukopenia: Mild, monitor. 3. Poor appetite: Continue Megace as prescribed. 4.  Anemia: Mild, monitor.  Approximately 30 minutes was spent in discussion of which greater than 50% was consultation.  Patient expressed understanding and was in agreement with this plan. She also understands that She can call clinic at any time with any questions, concerns, or complaints.   Cancer Staging Cancer of upper lobe of right lung [facility]) Staging form: Lung, AJCC 8th Edition - Clinical stage from 08/24/2017: Stage IIIB (cT2a, cN3, cM0) - Signed by Lloyd Huger, MD on 08/24/2017   Lloyd Huger, MD   10/26/2017 9:38 PM

## 2017-10-27 ENCOUNTER — Other Ambulatory Visit: Payer: Self-pay | Admitting: Oncology

## 2017-10-27 DIAGNOSIS — C3411 Malignant neoplasm of upper lobe, right bronchus or lung: Secondary | ICD-10-CM | POA: Diagnosis not present

## 2017-10-27 DIAGNOSIS — C3492 Malignant neoplasm of unspecified part of left bronchus or lung: Secondary | ICD-10-CM

## 2017-10-28 ENCOUNTER — Inpatient Hospital Stay: Payer: Managed Care, Other (non HMO) | Admitting: Oncology

## 2017-10-28 ENCOUNTER — Inpatient Hospital Stay: Payer: Managed Care, Other (non HMO)

## 2017-10-30 ENCOUNTER — Ambulatory Visit
Admission: RE | Admit: 2017-10-30 | Discharge: 2017-10-30 | Disposition: A | Discharge status: Home / Self Care | Payer: Managed Care, Other (non HMO) | Source: Ambulatory Visit | Attending: Radiation Oncology | Admitting: Radiation Oncology

## 2017-10-30 DIAGNOSIS — C3411 Malignant neoplasm of upper lobe, right bronchus or lung: Secondary | ICD-10-CM | POA: Diagnosis not present

## 2017-10-31 ENCOUNTER — Ambulatory Visit: Payer: Managed Care, Other (non HMO)

## 2017-10-31 ENCOUNTER — Ambulatory Visit
Admission: RE | Admit: 2017-10-31 | Discharge: 2017-10-31 | Disposition: A | Discharge status: Home / Self Care | Payer: Managed Care, Other (non HMO) | Source: Ambulatory Visit | Attending: Oncology | Admitting: Oncology

## 2017-10-31 ENCOUNTER — Other Ambulatory Visit: Payer: Self-pay | Admitting: Oncology

## 2017-10-31 DIAGNOSIS — J439 Emphysema, unspecified: Secondary | ICD-10-CM | POA: Diagnosis not present

## 2017-10-31 DIAGNOSIS — R918 Other nonspecific abnormal finding of lung field: Secondary | ICD-10-CM | POA: Diagnosis not present

## 2017-10-31 DIAGNOSIS — R590 Localized enlarged lymph nodes: Secondary | ICD-10-CM | POA: Insufficient documentation

## 2017-10-31 DIAGNOSIS — C3492 Malignant neoplasm of unspecified part of left bronchus or lung: Secondary | ICD-10-CM

## 2017-10-31 DIAGNOSIS — I700 Atherosclerosis of aorta: Secondary | ICD-10-CM | POA: Diagnosis not present

## 2017-10-31 MED ORDER — IOPAMIDOL (ISOVUE-300) INJECTION 61%
75.0000 mL | Freq: Once | INTRAVENOUS | Status: AC | PRN
  Administered 2017-10-31: 75 mL via INTRAVENOUS

## 2017-11-03 ENCOUNTER — Ambulatory Visit
Admission: RE | Admit: 2017-11-03 | Discharge: 2017-11-03 | Disposition: A | Discharge status: Home / Self Care | Payer: Managed Care, Other (non HMO) | Source: Ambulatory Visit | Attending: Radiation Oncology | Admitting: Radiation Oncology

## 2017-11-03 DIAGNOSIS — Z510 Encounter for antineoplastic radiation therapy: Secondary | ICD-10-CM | POA: Insufficient documentation

## 2017-11-03 DIAGNOSIS — C3411 Malignant neoplasm of upper lobe, right bronchus or lung: Secondary | ICD-10-CM | POA: Insufficient documentation

## 2017-11-03 NOTE — Progress Notes (Signed)
No problem. Its approved. Richardean Canal authorization number.  Faythe Casa, NP 11/03/2017 12:59 PM

## 2017-11-04 ENCOUNTER — Encounter: Payer: Self-pay | Admitting: Oncology

## 2017-11-04 ENCOUNTER — Ambulatory Visit
Admission: RE | Admit: 2017-11-04 | Discharge: 2017-11-04 | Disposition: A | Discharge status: Home / Self Care | Payer: Managed Care, Other (non HMO) | Source: Ambulatory Visit | Attending: Radiation Oncology | Admitting: Radiation Oncology

## 2017-11-04 ENCOUNTER — Inpatient Hospital Stay (HOSPITAL_BASED_OUTPATIENT_CLINIC_OR_DEPARTMENT_OTHER): Payer: Managed Care, Other (non HMO) | Admitting: Oncology

## 2017-11-04 ENCOUNTER — Inpatient Hospital Stay: Payer: Managed Care, Other (non HMO)

## 2017-11-04 ENCOUNTER — Inpatient Hospital Stay: Payer: Managed Care, Other (non HMO) | Attending: Oncology

## 2017-11-04 VITALS — BP 106/65 | HR 64 | Temp 97.7°F | Resp 16 | Wt 118.0 lb

## 2017-11-04 DIAGNOSIS — I10 Essential (primary) hypertension: Secondary | ICD-10-CM

## 2017-11-04 DIAGNOSIS — Z79899 Other long term (current) drug therapy: Secondary | ICD-10-CM | POA: Diagnosis not present

## 2017-11-04 DIAGNOSIS — C3411 Malignant neoplasm of upper lobe, right bronchus or lung: Secondary | ICD-10-CM

## 2017-11-04 DIAGNOSIS — D649 Anemia, unspecified: Secondary | ICD-10-CM | POA: Diagnosis not present

## 2017-11-04 DIAGNOSIS — Z923 Personal history of irradiation: Secondary | ICD-10-CM | POA: Diagnosis not present

## 2017-11-04 DIAGNOSIS — R5381 Other malaise: Secondary | ICD-10-CM | POA: Diagnosis not present

## 2017-11-04 DIAGNOSIS — Z9221 Personal history of antineoplastic chemotherapy: Secondary | ICD-10-CM | POA: Diagnosis not present

## 2017-11-04 DIAGNOSIS — Z803 Family history of malignant neoplasm of breast: Secondary | ICD-10-CM | POA: Diagnosis not present

## 2017-11-04 DIAGNOSIS — I700 Atherosclerosis of aorta: Secondary | ICD-10-CM | POA: Insufficient documentation

## 2017-11-04 DIAGNOSIS — R5383 Other fatigue: Secondary | ICD-10-CM | POA: Diagnosis not present

## 2017-11-04 DIAGNOSIS — E039 Hypothyroidism, unspecified: Secondary | ICD-10-CM | POA: Insufficient documentation

## 2017-11-04 DIAGNOSIS — Z87891 Personal history of nicotine dependence: Secondary | ICD-10-CM

## 2017-11-04 DIAGNOSIS — D72819 Decreased white blood cell count, unspecified: Secondary | ICD-10-CM | POA: Diagnosis not present

## 2017-11-04 DIAGNOSIS — E785 Hyperlipidemia, unspecified: Secondary | ICD-10-CM | POA: Diagnosis not present

## 2017-11-04 DIAGNOSIS — Z5112 Encounter for antineoplastic immunotherapy: Secondary | ICD-10-CM | POA: Diagnosis not present

## 2017-11-04 LAB — COMPREHENSIVE METABOLIC PANEL
ALBUMIN: 3.3 g/dL — AB (ref 3.5–5.0)
ALK PHOS: 50 U/L (ref 38–126)
ALT: 13 U/L — ABNORMAL LOW (ref 14–54)
AST: 21 U/L (ref 15–41)
Anion gap: 7 (ref 5–15)
BILIRUBIN TOTAL: 0.4 mg/dL (ref 0.3–1.2)
BUN: 7 mg/dL (ref 6–20)
CO2: 26 mmol/L (ref 22–32)
Calcium: 9 mg/dL (ref 8.9–10.3)
Chloride: 108 mmol/L (ref 101–111)
Creatinine, Ser: 0.54 mg/dL (ref 0.44–1.00)
GFR calc Af Amer: >60 mL/min (ref >60)
GFR calc non Af Amer: >60 mL/min (ref >60)
GLUCOSE: 82 mg/dL (ref 65–99)
Potassium: 4.1 mmol/L (ref 3.5–5.1)
SODIUM: 141 mmol/L (ref 135–145)
TOTAL PROTEIN: 6.2 g/dL — AB (ref 6.5–8.1)

## 2017-11-04 LAB — CBC WITH DIFFERENTIAL/PLATELET
BASOS ABS: 0 10*3/uL (ref 0–0.1)
Basophils Relative: 1 %
EOS PCT: 1 %
Eosinophils Absolute: 0 10*3/uL (ref 0–0.7)
HCT: 26.7 % — ABNORMAL LOW (ref 35.0–47.0)
Hemoglobin: 9 g/dL — ABNORMAL LOW (ref 12.0–16.0)
Lymphocytes Relative: 17 %
Lymphs Abs: 0.5 10*3/uL — ABNORMAL LOW (ref 1.0–3.6)
MCH: 32.7 pg (ref 26.0–34.0)
MCHC: 33.7 g/dL (ref 32.0–36.0)
MCV: 96.9 fL (ref 80.0–100.0)
MONO ABS: 0.3 10*3/uL (ref 0.2–0.9)
MONOS PCT: 11 %
Neutro Abs: 2 10*3/uL (ref 1.4–6.5)
Neutrophils Relative %: 70 %
PLATELETS: 263 10*3/uL (ref 150–440)
RBC: 2.75 MIL/uL — ABNORMAL LOW (ref 3.80–5.20)
RDW: 17.4 % — AB (ref 11.5–14.5)
WBC: 2.9 10*3/uL — ABNORMAL LOW (ref 3.6–11.0)

## 2017-11-04 MED ORDER — HEPARIN SOD (PORK) LOCK FLUSH 100 UNIT/ML IV SOLN
500.0000 [IU] | Freq: Once | INTRAVENOUS | Status: AC
  Administered 2017-11-04: 500 [IU] via INTRAVENOUS

## 2017-11-04 MED ORDER — HEPARIN SOD (PORK) LOCK FLUSH 100 UNIT/ML IV SOLN
500.0000 [IU] | Freq: Once | INTRAVENOUS | Status: DC | PRN
  Filled 2017-11-04: qty 5

## 2017-11-04 MED ORDER — SODIUM CHLORIDE 0.9 % IV SOLN
Freq: Once | INTRAVENOUS | Status: AC
  Administered 2017-11-04: 11:00:00 via INTRAVENOUS
  Filled 2017-11-04: qty 1000

## 2017-11-04 MED ORDER — SODIUM CHLORIDE 0.9% FLUSH
10.0000 mL | INTRAVENOUS | Status: DC | PRN
  Administered 2017-11-04: 10 mL via INTRAVENOUS
  Filled 2017-11-04: qty 10

## 2017-11-04 MED ORDER — SODIUM CHLORIDE 0.9 % IV SOLN
10.0000 mg/kg | Freq: Once | INTRAVENOUS | Status: AC
  Administered 2017-11-04: 500 mg via INTRAVENOUS
  Filled 2017-11-04: qty 10

## 2017-11-04 NOTE — Progress Notes (Signed)
Patient here today for follow up with labs and treatment with Imfinzi (new). She reports having a good appetite and her weight has increased. She denies having any pain.

## 2017-11-04 NOTE — Progress Notes (Signed)
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  INTERVAL HISTORY: Patient returns to clinic today for further evaluation and to begin cycle 1 of immunotherapy with imfinizi.  Patient recently had CT chest showing stable disease.  She tolerated 5 cycles of weekly carbo/Taxol, last treatment completed on 10/07/17. She tolerated daily XRT with only mild dysphasia from esophagitis.  She denies any recent fevers or illnesses, chest pain, shortness of breath, hemoptysis or cough.  She denies nausea, vomiting, constipation or diarrhea.    REVIEW OF SYSTEMS:   Review of Systems  Constitutional: Positive for malaise/fatigue. Negative for chills, fever and weight loss.  HENT: Negative for congestion and ear pain.   Eyes: Negative.  Negative for blurred vision and double vision.  Respiratory: Negative.  Negative for cough, sputum production and shortness of breath.   Cardiovascular: Negative.  Negative for chest pain, palpitations and leg swelling.  Gastrointestinal: Negative.  Negative for abdominal pain, constipation, diarrhea, nausea and vomiting.  Genitourinary: Negative for dysuria, frequency and urgency.  Musculoskeletal: Negative for back pain and falls.  Skin: Negative.  Negative for rash.  Neurological: Positive for dizziness (On occasion if she stands too fast). Negative for weakness and headaches.  Endo/Heme/Allergies: Negative.  Does not bruise/bleed easily.  Psychiatric/Behavioral: Negative.  Negative for depression. The patient is not nervous/anxious and does not have insomnia.     As per HPI. Otherwise, a complete review of systems is negative.  PAST MEDICAL HISTORY: Past Medical History:    Diagnosis Date  . Arthritis    right shoulder  . Cancer ([place])    right lung  . Hyperlipidemia   . Hypertension   . Hypothyroidism   . Menopausal state   . Thyroid disease     PAST SURGICAL HISTORY: Past Surgical History:  Procedure Laterality Date  . ABDOMINAL HYSTERECTOMY  2005  . BREAST EXCISIONAL BIOPSY Right 1979  . COLONOSCOPY WITH PROPOFOL N/A 05/13/2017   Procedure: COLONOSCOPY WITH PROPOFOL;  Surgeon: [name], MD;  Location: [facility] ENDOSCOPY;  Service: Gastroenterology;  Laterality: N/A;  . CYSTECTOMY Right    breast  . CYSTECTOMY  02/2015   back of neck  . ESOPHAGOGASTRODUODENOSCOPY (EGD) WITH PROPOFOL N/A 05/13/2017   Procedure: ESOPHAGOGASTRODUODENOSCOPY (EGD) WITH PROPOFOL;  Surgeon: [name], MD;  Location: [facility] ENDOSCOPY;  Service: Gastroenterology;  Laterality: N/A;  . PORTA CATH INSERTION N/A 09/01/2017   Procedure: PORTA CATH INSERTION;  Surgeon: Algernon Huxley, MD;  Location: [place] CV LAB;  Service: Cardiovascular;  Laterality: N/A;  . TUBAL LIGATION  1986    FAMILY HISTORY: Family History  Problem Relation Age of Onset  . Hypertension Mother   . Stroke Mother   . Leukemia Mother   . Stroke Father   . Pneumonia Father   . Diabetes Sister   . Hyperlipidemia Sister   . Breast cancer Maternal Aunt 17    ADVANCED DIRECTIVES (Y/N):  N  HEALTH MAINTENANCE: Social History   Tobacco Use  . Smoking status: Former Smoker    Packs/day: 0.25    Types: Cigarettes    Last attempt to quit: 02/01/2017    Years since quitting: 0.7  . Smokeless tobacco: Never Used  Substance Use Topics  . Alcohol use: No  Alcohol/week: 0.0 oz  . Drug use: No     Colonoscopy:  PAP:  Bone density:  Lipid panel:  No Known Allergies  Current Outpatient Medications  Medication Sig Dispense Refill  . amLODipine (NORVASC) 5 MG tablet TAKE 1 TABLET DAILY 90 tablet 1  . atorvastatin (LIPITOR) 10 MG tablet TAKE 1 TABLET DAILY AT 6 P.M. 90 tablet 1  .  levothyroxine (SYNTHROID, LEVOTHROID) 50 MCG tablet TAKE 1 TABLET DAILY 90 tablet 3  . lisinopril (PRINIVIL,ZESTRIL) 20 MG tablet TAKE 1 TABLET DAILY 90 tablet 1  . megestrol (MEGACE) 40 MG tablet Take 1 tablet (40 mg total) by mouth daily. 30 tablet 2  . sucralfate (CARAFATE) 1 g tablet Take 1 tablet (1 g total) by mouth 3 (three) times daily before meals. 90 tablet 3  . traMADol (ULTRAM) 50 MG tablet Take 1 tablet (50 mg total) by mouth 2 (two) times daily. 60 tablet 0   No current facility-administered medications for this visit.    Facility-Administered Medications Ordered in Other Visits  Medication Dose Route Frequency Provider Last Rate Last Dose  . 0.9 %  sodium chloride infusion   Intravenous Once Lloyd Huger, MD      . durvalumab (IMFINZI) 500 mg in sodium chloride 0.9 % 100 mL chemo infusion  10 mg/kg (Treatment Plan Recorded) Intravenous Once Lloyd Huger, MD      . heparin lock flush 100 unit/mL  500 Units Intravenous Once Lloyd Huger, MD      . heparin lock flush 100 unit/mL  500 Units Intracatheter Once PRN Lloyd Huger, MD      . sodium chloride flush (NS) 0.9 % injection 10 mL  10 mL Intravenous PRN Lloyd Huger, MD   10 mL at 11/04/17 0905    OBJECTIVE: Vitals:   11/04/17 0952 11/04/17 0954  BP: 106/65   Pulse: 64   Resp: 16   Temp: 97.7 F (36.5 C)   SpO2:  99%     Body mass index is 23.05 kg/m.    ECOG FS:0 - Asymptomatic  Physical Exam  Constitutional: She is oriented to person, place, and time and well-developed, well-nourished, and in no distress. Vital signs are normal.  HENT:  Head: Normocephalic and atraumatic.  Eyes: Pupils are equal, round, and reactive to light.  Neck: Normal range of motion.  Cardiovascular: Normal rate, regular rhythm and normal heart sounds.  No murmur heard. Pulmonary/Chest: Effort normal and breath sounds normal. She has no wheezes.  Abdominal: Soft. Normal appearance and bowel sounds are  normal. She exhibits no distension. There is no tenderness.  Musculoskeletal: Normal range of motion. She exhibits no edema.  Neurological: She is alert and oriented to person, place, and time. Gait normal.  Skin: Skin is warm and dry. No rash noted.  Psychiatric: Mood, memory, affect and judgment normal.     LAB RESULTS:  Lab Results  Component Value Date   NA 141 11/04/2017   K 4.1 11/04/2017   CL 108 11/04/2017   CO2 26 11/04/2017   GLUCOSE 82 11/04/2017   BUN 7 11/04/2017   CREATININE 0.54 11/04/2017   CALCIUM 9.0 11/04/2017   PROT 6.2 (L) 11/04/2017   ALBUMIN 3.3 (L) 11/04/2017   AST 21 11/04/2017   ALT 13 (L) 11/04/2017   ALKPHOS 50 11/04/2017   BILITOT 0.4 11/04/2017   GFRNONAA >60 11/04/2017   GFRAA >60 11/04/2017    Lab Results  Component Value Date   WBC 2.9 (  L) 11/04/2017   NEUTROABS 2.0 11/04/2017   HGB 9.0 (L) 11/04/2017   HCT 26.7 (L) 11/04/2017   MCV 96.9 11/04/2017   PLT 263 11/04/2017     STUDIES: Ct Chest W Contrast  Result Date: 10/31/2017 CLINICAL DATA:  Restaging right lung cancer. Initial diagnosis December 2018. Treated with chemotherapy and radiation therapy. EXAM: CT CHEST WITH CONTRAST TECHNIQUE: Multidetector CT imaging of the chest was performed during intravenous contrast administration. CONTRAST:  40mL ISOVUE-300 IOPAMIDOL (ISOVUE-300) INJECTION 61% COMPARISON:  PET-CT 08/08/2017 and chest CT 07/17/2017 FINDINGS: Cardiovascular: The heart is normal in size. No pericardial effusion. Stable tortuosity and atherosclerotic calcifications involving the aorta. No dissection. Stable branch vessel calcifications including coronary artery calcifications. Mediastinum/Nodes: Hazy soft tissue density in the mediastinum surrounding the trachea could be radiation change. No discrete measurable lymph nodes in this area. There is a 12 mm right hilar node on image number 53 which is hard to compare to the prior noncontrast examinations. The esophagus is  grossly normal. No obvious supraclavicular lymph nodes.  No axillary adenopathy. The esophagus is grossly normal. Lungs/Pleura: Stable large right upper lobe lung lesion measuring a maximum of 2.5 x 2.4 cm. Stable 4 mm nodule in the right lower lobe just posterior to the major fissure on image number 73. Stable 5 mm right lower lobe pulmonary nodule on image number 99. Stable sub 4 mm pulmonary nodules in the left lower lobe. No new pulmonary nodules to suggest metastatic disease. No acute overlying pulmonary process. No pleural effusion. Upper Abdomen: No significant upper abdominal findings. No obvious adrenal gland or hepatic metastasis. Musculoskeletal: No worrisome bone lesions. IMPRESSION: 1. Stable right apical lung mass. No regression or significant surrounding radiation changes. 2. Improved appearing mediastinal and hilar adenopathy. Ill-defined soft tissue density likely treated disease without progressive findings. 3. Stable small bilateral pulmonary nodules. No new new or progressive findings. 4. No findings for upper abdominal metastatic disease. Aortic Atherosclerosis (ICD10-I70.0) and Emphysema (ICD10-J43.9). Electronically Signed   By: Marijo Sanes M.D.   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  PLAN:    1. 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 Proceed with maintenance immunotherapy with imfinizi every 2 weeks for 12 months.  She is scheduled to complete daily radiation on 11/19/2017.  Return to clinic in 2 weeks for labs, MD assessment and cycle 2 of imfinizi. 2.  Anemia: Hemoglobin today is 9. Patient is not symptomatic.  Continue to monitor. 3.  Poor appetite: Has improved tremendously with the use of Megace and "some time off" from radiation.  Her weight today is 118 pounds.  This is a 9 pound weight gain in 1 month. 4.  Leukopenia: Mild,  monitor.  Approximately 30 minutes was spent in discussion of which greater than 50% was consultation.  Patient expressed understanding and was in agreement with this plan. She also understands that She can call clinic at any time with any questions, concerns, or complaints.   Cancer Staging Cancer of upper lobe of right lung [place]) Staging form: Lung, AJCC 8th Edition - Clinical stage from 08/24/2017: Stage IIIB (cT2a, cN3, cM0) - Signed by Lloyd Huger, MD on 08/24/2017   Jacquelin Hawking, NP   11/04/2017 10:59 AM

## 2017-11-05 ENCOUNTER — Ambulatory Visit
Admission: RE | Admit: 2017-11-05 | Discharge: 2017-11-05 | Disposition: A | Discharge status: Home / Self Care | Payer: Managed Care, Other (non HMO) | Source: Ambulatory Visit | Attending: Radiation Oncology | Admitting: Radiation Oncology

## 2017-11-05 DIAGNOSIS — C3411 Malignant neoplasm of upper lobe, right bronchus or lung: Secondary | ICD-10-CM | POA: Diagnosis not present

## 2017-11-05 LAB — THYROID PANEL WITH TSH
FREE THYROXINE INDEX: 1.6 (ref 1.2–4.9)
T3 Uptake Ratio: 23 % — ABNORMAL LOW (ref 24–39)
T4, Total: 7 ug/dL (ref 4.5–12.0)
TSH: 3.23 u[IU]/mL (ref 0.450–4.500)

## 2017-11-06 ENCOUNTER — Ambulatory Visit
Admission: RE | Admit: 2017-11-06 | Discharge: 2017-11-06 | Disposition: A | Discharge status: Home / Self Care | Payer: Managed Care, Other (non HMO) | Source: Ambulatory Visit | Attending: Radiation Oncology | Admitting: Radiation Oncology

## 2017-11-06 DIAGNOSIS — C3411 Malignant neoplasm of upper lobe, right bronchus or lung: Secondary | ICD-10-CM | POA: Diagnosis not present

## 2017-11-07 ENCOUNTER — Ambulatory Visit
Admission: RE | Admit: 2017-11-07 | Discharge: 2017-11-07 | Disposition: A | Discharge status: Home / Self Care | Payer: Managed Care, Other (non HMO) | Source: Ambulatory Visit | Attending: Radiation Oncology | Admitting: Radiation Oncology

## 2017-11-07 DIAGNOSIS — C3411 Malignant neoplasm of upper lobe, right bronchus or lung: Secondary | ICD-10-CM | POA: Diagnosis not present

## 2017-11-10 ENCOUNTER — Ambulatory Visit
Admission: RE | Admit: 2017-11-10 | Discharge: 2017-11-10 | Disposition: A | Discharge status: Home / Self Care | Payer: Managed Care, Other (non HMO) | Source: Ambulatory Visit | Attending: Radiation Oncology | Admitting: Radiation Oncology

## 2017-11-10 DIAGNOSIS — C3411 Malignant neoplasm of upper lobe, right bronchus or lung: Secondary | ICD-10-CM | POA: Diagnosis not present

## 2017-11-11 ENCOUNTER — Ambulatory Visit
Admission: RE | Admit: 2017-11-11 | Discharge: 2017-11-11 | Disposition: A | Discharge status: Home / Self Care | Payer: Managed Care, Other (non HMO) | Source: Ambulatory Visit | Attending: Radiation Oncology | Admitting: Radiation Oncology

## 2017-11-11 DIAGNOSIS — C3411 Malignant neoplasm of upper lobe, right bronchus or lung: Secondary | ICD-10-CM | POA: Diagnosis not present

## 2017-11-12 ENCOUNTER — Ambulatory Visit
Admission: RE | Admit: 2017-11-12 | Discharge: 2017-11-12 | Disposition: A | Discharge status: Home / Self Care | Payer: Managed Care, Other (non HMO) | Source: Ambulatory Visit | Attending: Radiation Oncology | Admitting: Radiation Oncology

## 2017-11-12 DIAGNOSIS — C3411 Malignant neoplasm of upper lobe, right bronchus or lung: Secondary | ICD-10-CM | POA: Diagnosis not present

## 2017-11-13 ENCOUNTER — Ambulatory Visit
Admission: RE | Admit: 2017-11-13 | Discharge: 2017-11-13 | Disposition: A | Discharge status: Home / Self Care | Payer: Managed Care, Other (non HMO) | Source: Ambulatory Visit | Attending: Radiation Oncology | Admitting: Radiation Oncology

## 2017-11-13 DIAGNOSIS — C3411 Malignant neoplasm of upper lobe, right bronchus or lung: Secondary | ICD-10-CM | POA: Diagnosis not present

## 2017-11-13 NOTE — Progress Notes (Signed)
Nutrition Follow-up:  Patient with lung cancer.  Patient currently taking imfinizi (immunotherapy) and daily radiation to complete on 4/17.    Met with patient today following radiation.  Patient reports that her appetite is better after finishing chemotherapy.  "I am eating anything I want too."  Reports that she is not really drinking ensure plus that much anymore. "I can't drink as much as you want me too."  Reports that she is eating meats more without difficulty (chicken, meatloaf).  Reports that she is eating eggs, peanut butter, beans and other vegetables.  Does not complain of swallowing today.   Medications: reviewed  Labs: reviewed  Anthropometrics:   Weight has increased to 118 lb noted on 4/2 from 109 lb on 3/5.     NUTRITION DIAGNOSIS: Malnutrition improved   MALNUTRITION DIAGNOSIS: severe malnutrition improved   INTERVENTION:   Encouraged patient to continue to choose foods high in calories and protein Congratulated patient on gaining weight    MONITORING, EVALUATION, GOAL: weight trends, intake   NEXT VISIT: as needed  Raahil Ong B. Zenia Resides, [place], [place] Registered Dietitian [phone] (pager)

## 2017-11-14 ENCOUNTER — Ambulatory Visit
Admission: RE | Admit: 2017-11-14 | Discharge: 2017-11-14 | Disposition: A | Discharge status: Home / Self Care | Payer: Managed Care, Other (non HMO) | Source: Ambulatory Visit | Attending: Radiation Oncology | Admitting: Radiation Oncology

## 2017-11-14 DIAGNOSIS — C3411 Malignant neoplasm of upper lobe, right bronchus or lung: Secondary | ICD-10-CM | POA: Diagnosis not present

## 2017-11-16 ENCOUNTER — Other Ambulatory Visit: Payer: Self-pay | Admitting: Oncology

## 2017-11-16 DIAGNOSIS — D649 Anemia, unspecified: Secondary | ICD-10-CM

## 2017-11-16 NOTE — Progress Notes (Signed)
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  INTERVAL HISTORY: Patient returns to clinic today for further evaluation and consideration of cycle 2 of maintenance Durvalumab.  She tolerated her first treatment well without significant side effects.  She completed her XRT this morning.  She has no neurologic complaints.  She denies any recent fevers or illnesses. She denies any chest pain, shortness of breath, hemoptysis, or cough.  She has no nausea, vomiting, constipation, or diarrhea.  She has no melena or hematochezia.  Patient offers no specific complaints today.  REVIEW OF SYSTEMS:   Review of Systems  Constitutional: Negative.  Negative for fever, malaise/fatigue and weight loss.  Respiratory: Negative.  Negative for cough and shortness of breath.   Cardiovascular: Negative.  Negative for chest pain and leg swelling.  Gastrointestinal: Negative.  Negative for abdominal pain, blood in stool and melena.  Genitourinary: Negative.  Negative for dysuria.  Musculoskeletal: Negative.   Skin: Negative.  Negative for rash.  Neurological: Negative.  Negative for sensory change, focal weakness and weakness.  Psychiatric/Behavioral: Negative.  The patient is not nervous/anxious.     As per HPI. Otherwise, a complete review of systems is negative.  PAST MEDICAL HISTORY: Past Medical History:  Diagnosis Date  . Arthritis    right shoulder  . Cancer ([place])    right lung  . Hyperlipidemia   . Hypertension   . Hypothyroidism   . Menopausal state   . Thyroid disease     PAST SURGICAL HISTORY: Past Surgical History:  Procedure Laterality Date  . ABDOMINAL HYSTERECTOMY  2005  . BREAST EXCISIONAL  BIOPSY Right 1979  . COLONOSCOPY WITH PROPOFOL N/A 05/13/2017   Procedure: COLONOSCOPY WITH PROPOFOL;  Surgeon: [name], MD;  Location: [facility] ENDOSCOPY;  Service: Gastroenterology;  Laterality: N/A;  . CYSTECTOMY Right    breast  . CYSTECTOMY  02/2015   back of neck  . ESOPHAGOGASTRODUODENOSCOPY (EGD) WITH PROPOFOL N/A 05/13/2017   Procedure: ESOPHAGOGASTRODUODENOSCOPY (EGD) WITH PROPOFOL;  Surgeon: [name], MD;  Location: [facility] ENDOSCOPY;  Service: Gastroenterology;  Laterality: N/A;  . PORTA CATH INSERTION N/A 09/01/2017   Procedure: PORTA CATH INSERTION;  Surgeon: Algernon Huxley, MD;  Location: [place] CV LAB;  Service: Cardiovascular;  Laterality: N/A;  . TUBAL LIGATION  1986    FAMILY HISTORY: Family History  Problem Relation Age of Onset  . Hypertension Mother   . Stroke Mother   . Leukemia Mother   . Stroke Father   . Pneumonia Father   . Diabetes Sister   . Hyperlipidemia Sister   . Breast cancer Maternal Aunt 65    ADVANCED DIRECTIVES (Y/N):  N  HEALTH MAINTENANCE: Social History   Tobacco Use  . Smoking status: Former Smoker    Packs/day: 0.25    Types: Cigarettes    Last attempt to quit: 02/01/2017    Years since quitting: 0.7  . Smokeless tobacco: Never Used  Substance Use Topics  . Alcohol use: No    Alcohol/week: 0.0 oz  . Drug use: No     Colonoscopy:  PAP:  Bone density:  Lipid panel:  No Known Allergies  Current Outpatient Medications  Medication Sig Dispense Refill  . amLODipine (NORVASC) 5 MG tablet TAKE 1  TABLET DAILY 90 tablet 1  . atorvastatin (LIPITOR) 10 MG tablet TAKE 1 TABLET DAILY AT 6 P.M. 90 tablet 1  . levothyroxine (SYNTHROID, LEVOTHROID) 50 MCG tablet TAKE 1 TABLET DAILY 90 tablet 3  . lisinopril (PRINIVIL,ZESTRIL) 20 MG tablet TAKE 1 TABLET DAILY 90 tablet 1  . megestrol (MEGACE) 40 MG tablet Take 1 tablet (40 mg total) by mouth daily. 30 tablet 2  . sucralfate (CARAFATE) 1 g tablet Take 1 tablet (1 g total) by mouth 3  (three) times daily before meals. 90 tablet 3  . traMADol (ULTRAM) 50 MG tablet Take 1 tablet (50 mg total) by mouth 2 (two) times daily. 60 tablet 0   No current facility-administered medications for this visit.    Facility-Administered Medications Ordered in Other Visits  Medication Dose Route Frequency Provider Last Rate Last Dose  . durvalumab (IMFINZI) 500 mg in sodium chloride 0.9 % 100 mL chemo infusion  10 mg/kg (Treatment Plan Recorded) Intravenous Once Lloyd Huger, MD      . heparin lock flush 100 unit/mL  500 Units Intracatheter Once PRN Lloyd Huger, MD        OBJECTIVE: Vitals:   11/18/17 1045  BP: 112/70  Pulse: 76  Resp: 12  Temp: (!) 97.5 F (36.4 C)     Body mass index is 23.55 kg/m.    ECOG FS:0 - Asymptomatic  General: Well-developed, well-nourished, no acute distress. Eyes: Pink conjunctiva, anicteric sclera. Lungs: Clear to auscultation bilaterally. Heart: Regular rate and rhythm. No rubs, murmurs, or gallops. Abdomen: Soft, nontender, nondistended. No organomegaly noted, normoactive bowel sounds. Musculoskeletal: No edema, cyanosis, or clubbing. Neuro: Alert, answering all questions appropriately. Cranial nerves grossly intact. Skin: No rashes or petechiae noted. Psych: Normal affect.  LAB RESULTS:  Lab Results  Component Value Date   NA 138 11/18/2017   K 4.2 11/18/2017   CL 105 11/18/2017   CO2 25 11/18/2017   GLUCOSE 87 11/18/2017   BUN 10 11/18/2017   CREATININE 0.53 11/18/2017   CALCIUM 9.1 11/18/2017   PROT 6.7 11/18/2017   ALBUMIN 3.6 11/18/2017   AST 16 11/18/2017   ALT 10 (L) 11/18/2017   ALKPHOS 59 11/18/2017   BILITOT 0.4 11/18/2017   GFRNONAA >60 11/18/2017   GFRAA >60 11/18/2017    Lab Results  Component Value Date   WBC 2.3 (L) 11/18/2017   NEUTROABS 1.7 11/18/2017   HGB 9.8 (L) 11/18/2017   HCT 28.2 (L) 11/18/2017   MCV 96.0 11/18/2017   PLT 307 11/18/2017     STUDIES: Ct Chest W Contrast  Result  Date: 10/31/2017 CLINICAL DATA:  Restaging right lung cancer. Initial diagnosis December 2018. Treated with chemotherapy and radiation therapy. EXAM: CT CHEST WITH CONTRAST TECHNIQUE: Multidetector CT imaging of the chest was performed during intravenous contrast administration. CONTRAST:  38mL ISOVUE-300 IOPAMIDOL (ISOVUE-300) INJECTION 61% COMPARISON:  PET-CT 08/08/2017 and chest CT 07/17/2017 FINDINGS: Cardiovascular: The heart is normal in size. No pericardial effusion. Stable tortuosity and atherosclerotic calcifications involving the aorta. No dissection. Stable branch vessel calcifications including coronary artery calcifications. Mediastinum/Nodes: Hazy soft tissue density in the mediastinum surrounding the trachea could be radiation change. No discrete measurable lymph nodes in this area. There is a 12 mm right hilar node on image number 53 which is hard to compare to the prior noncontrast examinations. The esophagus is grossly normal. No obvious supraclavicular lymph nodes.  No axillary adenopathy. The esophagus is grossly normal. Lungs/Pleura: Stable large right upper lobe lung lesion  measuring a maximum of 2.5 x 2.4 cm. Stable 4 mm nodule in the right lower lobe just posterior to the major fissure on image number 73. Stable 5 mm right lower lobe pulmonary nodule on image number 99. Stable sub 4 mm pulmonary nodules in the left lower lobe. No new pulmonary nodules to suggest metastatic disease. No acute overlying pulmonary process. No pleural effusion. Upper Abdomen: No significant upper abdominal findings. No obvious adrenal gland or hepatic metastasis. Musculoskeletal: No worrisome bone lesions. IMPRESSION: 1. Stable right apical lung mass. No regression or significant surrounding radiation changes. 2. Improved appearing mediastinal and hilar adenopathy. Ill-defined soft tissue density likely treated disease without progressive findings. 3. Stable small bilateral pulmonary nodules. No new new or  progressive findings. 4. No findings for upper abdominal metastatic disease. Aortic Atherosclerosis (ICD10-I70.0) and Emphysema (ICD10-J43.9). Electronically Signed   By: Marijo Sanes M.D.   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  PLAN:    1. 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  Patient states her last infusion of carboplatinum and Taxol on October 07, 2017.  Her XRT was delayed and therefore she did not finish until this morning.  Proceed with cycle 2 of maintenance Durvalumab today.  Patient will receive treatment every 2 weeks for a total of 12 months.  Return to clinic in 2 weeks for consideration of cycle 3 and then in 4 weeks for further evaluation and consideration of cycle 4  2. Leukopenia: Patient's white blood cell count is decreased, but stable. 3. Poor appetite: Improved.  Continue Megace as needed. 4.  Anemia: Mild, monitor. 5.  Disposition: Patient is requesting documentation stating she can return to work and was given a letter to start on Dec 03, 2017.  Approximately 30 minutes spent in discussion of which greater than 50% was consultation.  Patient expressed understanding and was in agreement with this plan. She also understands that She can call clinic at any time with any questions, concerns, or complaints.   Cancer Staging Cancer of upper lobe of right lung [facility]) Staging form: Lung, AJCC 8th Edition - Clinical stage from 08/24/2017: Stage IIIB (cT2a, cN3, cM0) - Signed by Lloyd Huger, MD on 08/24/2017   Lloyd Huger, MD   11/18/2017 11:47 AM

## 2017-11-17 ENCOUNTER — Ambulatory Visit
Admission: RE | Admit: 2017-11-17 | Discharge: 2017-11-17 | Disposition: A | Discharge status: Home / Self Care | Payer: Managed Care, Other (non HMO) | Source: Ambulatory Visit | Attending: Radiation Oncology | Admitting: Radiation Oncology

## 2017-11-17 DIAGNOSIS — C3411 Malignant neoplasm of upper lobe, right bronchus or lung: Secondary | ICD-10-CM | POA: Diagnosis not present

## 2017-11-18 ENCOUNTER — Other Ambulatory Visit: Payer: Self-pay

## 2017-11-18 ENCOUNTER — Encounter: Payer: Self-pay | Admitting: Oncology

## 2017-11-18 ENCOUNTER — Ambulatory Visit: Payer: Managed Care, Other (non HMO)

## 2017-11-18 ENCOUNTER — Encounter: Payer: Self-pay | Admitting: *Deleted

## 2017-11-18 ENCOUNTER — Ambulatory Visit
Admission: RE | Admit: 2017-11-18 | Discharge: 2017-11-18 | Disposition: A | Discharge status: Home / Self Care | Payer: Managed Care, Other (non HMO) | Source: Ambulatory Visit | Attending: Radiation Oncology | Admitting: Radiation Oncology

## 2017-11-18 ENCOUNTER — Inpatient Hospital Stay: Payer: Managed Care, Other (non HMO)

## 2017-11-18 ENCOUNTER — Inpatient Hospital Stay (HOSPITAL_BASED_OUTPATIENT_CLINIC_OR_DEPARTMENT_OTHER): Payer: Managed Care, Other (non HMO) | Admitting: Oncology

## 2017-11-18 VITALS — BP 112/70 | HR 76 | Temp 97.5°F | Resp 12 | Wt 120.6 lb

## 2017-11-18 DIAGNOSIS — I10 Essential (primary) hypertension: Secondary | ICD-10-CM | POA: Diagnosis not present

## 2017-11-18 DIAGNOSIS — C3411 Malignant neoplasm of upper lobe, right bronchus or lung: Secondary | ICD-10-CM | POA: Diagnosis not present

## 2017-11-18 DIAGNOSIS — Z79899 Other long term (current) drug therapy: Secondary | ICD-10-CM | POA: Diagnosis not present

## 2017-11-18 DIAGNOSIS — I700 Atherosclerosis of aorta: Secondary | ICD-10-CM

## 2017-11-18 DIAGNOSIS — D72819 Decreased white blood cell count, unspecified: Secondary | ICD-10-CM | POA: Diagnosis not present

## 2017-11-18 DIAGNOSIS — Z87891 Personal history of nicotine dependence: Secondary | ICD-10-CM

## 2017-11-18 DIAGNOSIS — D039 Melanoma in situ, unspecified: Secondary | ICD-10-CM | POA: Diagnosis not present

## 2017-11-18 DIAGNOSIS — E785 Hyperlipidemia, unspecified: Secondary | ICD-10-CM

## 2017-11-18 DIAGNOSIS — Z923 Personal history of irradiation: Secondary | ICD-10-CM | POA: Diagnosis not present

## 2017-11-18 DIAGNOSIS — Z803 Family history of malignant neoplasm of breast: Secondary | ICD-10-CM

## 2017-11-18 DIAGNOSIS — Z9221 Personal history of antineoplastic chemotherapy: Secondary | ICD-10-CM | POA: Diagnosis not present

## 2017-11-18 DIAGNOSIS — D649 Anemia, unspecified: Secondary | ICD-10-CM

## 2017-11-18 LAB — CBC WITH DIFFERENTIAL/PLATELET
Basophils Absolute: 0 10*3/uL (ref 0–0.1)
Basophils Relative: 1 %
EOS ABS: 0.1 10*3/uL (ref 0–0.7)
Eosinophils Relative: 2 %
HCT: 28.2 % — ABNORMAL LOW (ref 35.0–47.0)
HEMOGLOBIN: 9.8 g/dL — AB (ref 12.0–16.0)
LYMPHS ABS: 0.3 10*3/uL — AB (ref 1.0–3.6)
LYMPHS PCT: 11 %
MCH: 33.2 pg (ref 26.0–34.0)
MCHC: 34.6 g/dL (ref 32.0–36.0)
MCV: 96 fL (ref 80.0–100.0)
MONOS PCT: 15 %
Monocytes Absolute: 0.4 10*3/uL (ref 0.2–0.9)
NEUTROS PCT: 71 %
Neutro Abs: 1.7 10*3/uL (ref 1.4–6.5)
Platelets: 307 10*3/uL (ref 150–440)
RBC: 2.94 MIL/uL — ABNORMAL LOW (ref 3.80–5.20)
RDW: 17.3 % — ABNORMAL HIGH (ref 11.5–14.5)
WBC: 2.3 10*3/uL — ABNORMAL LOW (ref 3.6–11.0)

## 2017-11-18 LAB — COMPREHENSIVE METABOLIC PANEL
ALK PHOS: 59 U/L (ref 38–126)
ALT: 10 U/L — ABNORMAL LOW (ref 14–54)
ANION GAP: 8 (ref 5–15)
AST: 16 U/L (ref 15–41)
Albumin: 3.6 g/dL (ref 3.5–5.0)
BILIRUBIN TOTAL: 0.4 mg/dL (ref 0.3–1.2)
BUN: 10 mg/dL (ref 6–20)
CALCIUM: 9.1 mg/dL (ref 8.9–10.3)
CO2: 25 mmol/L (ref 22–32)
Chloride: 105 mmol/L (ref 101–111)
Creatinine, Ser: 0.53 mg/dL (ref 0.44–1.00)
Glucose, Bld: 87 mg/dL (ref 65–99)
Potassium: 4.2 mmol/L (ref 3.5–5.1)
SODIUM: 138 mmol/L (ref 135–145)
TOTAL PROTEIN: 6.7 g/dL (ref 6.5–8.1)

## 2017-11-18 MED ORDER — HEPARIN SOD (PORK) LOCK FLUSH 100 UNIT/ML IV SOLN
500.0000 [IU] | Freq: Once | INTRAVENOUS | Status: AC | PRN
  Administered 2017-11-18: 500 [IU]
  Filled 2017-11-18: qty 5

## 2017-11-18 MED ORDER — SODIUM CHLORIDE 0.9 % IV SOLN
Freq: Once | INTRAVENOUS | Status: AC
Start: 2017-11-18 — End: 2017-11-18
  Administered 2017-11-18: 12:00:00 via INTRAVENOUS
  Filled 2017-11-18: qty 1000

## 2017-11-18 MED ORDER — SODIUM CHLORIDE 0.9 % IV SOLN
10.0000 mg/kg | Freq: Once | INTRAVENOUS | Status: AC
  Administered 2017-11-18: 500 mg via INTRAVENOUS
  Filled 2017-11-18: qty 10

## 2017-11-18 NOTE — Progress Notes (Signed)
Patient here for follow up

## 2017-11-19 ENCOUNTER — Ambulatory Visit: Payer: Managed Care, Other (non HMO)

## 2017-11-19 LAB — THYROID PANEL WITH TSH
FREE THYROXINE INDEX: 1.9 (ref 1.2–4.9)
T3 Uptake Ratio: 24 % (ref 24–39)
T4, Total: 8 ug/dL (ref 4.5–12.0)
TSH: 2.42 u[IU]/mL (ref 0.450–4.500)

## 2017-12-02 ENCOUNTER — Inpatient Hospital Stay: Payer: Managed Care, Other (non HMO)

## 2017-12-02 VITALS — BP 108/64 | HR 90 | Temp 98.0°F | Resp 20 | Wt 126.0 lb

## 2017-12-02 DIAGNOSIS — C3411 Malignant neoplasm of upper lobe, right bronchus or lung: Secondary | ICD-10-CM | POA: Diagnosis not present

## 2017-12-02 LAB — COMPREHENSIVE METABOLIC PANEL
ALBUMIN: 3.3 g/dL — AB (ref 3.5–5.0)
ALK PHOS: 66 U/L (ref 38–126)
ALT: 11 U/L — AB (ref 14–54)
AST: 21 U/L (ref 15–41)
Anion gap: 7 (ref 5–15)
BUN: 8 mg/dL (ref 6–20)
CALCIUM: 8.8 mg/dL — AB (ref 8.9–10.3)
CO2: 25 mmol/L (ref 22–32)
CREATININE: 0.59 mg/dL (ref 0.44–1.00)
Chloride: 106 mmol/L (ref 101–111)
GFR calc Af Amer: >60 mL/min (ref >60)
GFR calc non Af Amer: >60 mL/min (ref >60)
GLUCOSE: 94 mg/dL (ref 65–99)
Potassium: 3.4 mmol/L — ABNORMAL LOW (ref 3.5–5.1)
SODIUM: 138 mmol/L (ref 135–145)
Total Bilirubin: 0.3 mg/dL (ref 0.3–1.2)
Total Protein: 6.6 g/dL (ref 6.5–8.1)

## 2017-12-02 LAB — CBC WITH DIFFERENTIAL/PLATELET
BASOS PCT: 1 %
Basophils Absolute: 0 10*3/uL (ref 0–0.1)
EOS ABS: 0.1 10*3/uL (ref 0–0.7)
Eosinophils Relative: 2 %
HEMATOCRIT: 26.8 % — AB (ref 35.0–47.0)
Hemoglobin: 9.2 g/dL — ABNORMAL LOW (ref 12.0–16.0)
Lymphocytes Relative: 7 %
Lymphs Abs: 0.3 10*3/uL — ABNORMAL LOW (ref 1.0–3.6)
MCH: 33.2 pg (ref 26.0–34.0)
MCHC: 34.3 g/dL (ref 32.0–36.0)
MCV: 96.9 fL (ref 80.0–100.0)
MONO ABS: 0.5 10*3/uL (ref 0.2–0.9)
MONOS PCT: 13 %
NEUTROS ABS: 3.2 10*3/uL (ref 1.4–6.5)
Neutrophils Relative %: 77 %
Platelets: 266 10*3/uL (ref 150–440)
RBC: 2.77 MIL/uL — ABNORMAL LOW (ref 3.80–5.20)
RDW: 15.8 % — AB (ref 11.5–14.5)
WBC: 4.1 10*3/uL (ref 3.6–11.0)

## 2017-12-02 MED ORDER — SODIUM CHLORIDE 0.9 % IV SOLN
10.0000 mg/kg | Freq: Once | INTRAVENOUS | Status: AC
  Administered 2017-12-02: 500 mg via INTRAVENOUS
  Filled 2017-12-02: qty 10

## 2017-12-02 MED ORDER — HEPARIN SOD (PORK) LOCK FLUSH 100 UNIT/ML IV SOLN
500.0000 [IU] | Freq: Once | INTRAVENOUS | Status: AC | PRN
  Administered 2017-12-02: 500 [IU]
  Filled 2017-12-02: qty 5

## 2017-12-02 MED ORDER — SODIUM CHLORIDE 0.9 % IV SOLN
Freq: Once | INTRAVENOUS | Status: AC
  Administered 2017-12-02: 11:00:00 via INTRAVENOUS
  Filled 2017-12-02: qty 1000

## 2017-12-03 LAB — THYROID PANEL WITH TSH
FREE THYROXINE INDEX: 1.7 (ref 1.2–4.9)
T3 UPTAKE RATIO: 22 % — AB (ref 24–39)
T4, Total: 7.6 ug/dL (ref 4.5–12.0)
TSH: 1.81 u[IU]/mL (ref 0.450–4.500)

## 2017-12-14 NOTE — Progress Notes (Signed)
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  INTERVAL HISTORY: Patient returns to clinic today for further evaluation and consideration of cycle 4 of maintenance Durvalumab.  She is now finally completed her XRT.  She is tolerating her treatments well without significant side effects.  She remains active and is back to work full-time.  She has no neurologic complaints.  She denies any recent fevers or illnesses. She denies any chest pain, shortness of breath, hemoptysis, or cough.  She has no nausea, vomiting, constipation, or diarrhea.  She has no melena or hematochezia.  Patient offers no specific complaints today.  REVIEW OF SYSTEMS:   Review of Systems  Constitutional: Negative.  Negative for fever, malaise/fatigue and weight loss.  Respiratory: Negative.  Negative for cough and shortness of breath.   Cardiovascular: Negative.  Negative for chest pain and leg swelling.  Gastrointestinal: Negative.  Negative for abdominal pain, blood in stool and melena.  Genitourinary: Negative.  Negative for dysuria.  Musculoskeletal: Negative.  Negative for back pain.  Skin: Negative.  Negative for rash.  Neurological: Negative.  Negative for sensory change, focal weakness and weakness.  Psychiatric/Behavioral: Negative.  The patient is not nervous/anxious.     As per HPI. Otherwise, a complete review of systems is negative.  PAST MEDICAL HISTORY: Past Medical History:  Diagnosis Date  . Arthritis    right shoulder  . Cancer ([place])    right lung  . Hyperlipidemia   . Hypertension   . Hypothyroidism   . Menopausal state   . Thyroid disease     PAST SURGICAL HISTORY: Past Surgical History:    Procedure Laterality Date  . ABDOMINAL HYSTERECTOMY  2005  . BREAST EXCISIONAL BIOPSY Right 1979  . COLONOSCOPY WITH PROPOFOL N/A 05/13/2017   Procedure: COLONOSCOPY WITH PROPOFOL;  Surgeon: [name], MD;  Location: [facility] ENDOSCOPY;  Service: Gastroenterology;  Laterality: N/A;  . CYSTECTOMY Right    breast  . CYSTECTOMY  02/2015   back of neck  . ESOPHAGOGASTRODUODENOSCOPY (EGD) WITH PROPOFOL N/A 05/13/2017   Procedure: ESOPHAGOGASTRODUODENOSCOPY (EGD) WITH PROPOFOL;  Surgeon: [name], MD;  Location: [facility] ENDOSCOPY;  Service: Gastroenterology;  Laterality: N/A;  . PORTA CATH INSERTION N/A 09/01/2017   Procedure: PORTA CATH INSERTION;  Surgeon: Algernon Huxley, MD;  Location: [place] CV LAB;  Service: Cardiovascular;  Laterality: N/A;  . TUBAL LIGATION  1986    FAMILY HISTORY: Family History  Problem Relation Age of Onset  . Hypertension Mother   . Stroke Mother   . Leukemia Mother   . Stroke Father   . Pneumonia Father   . Diabetes Sister   . Hyperlipidemia Sister   . Breast cancer Maternal Aunt 75    ADVANCED DIRECTIVES (Y/N):  N  HEALTH MAINTENANCE: Social History   Tobacco Use  . Smoking status: Former Smoker    Packs/day: 0.25    Types: Cigarettes    Last attempt to quit: 02/01/2017    Years since quitting: 0.8  . Smokeless tobacco: Never Used  Substance Use Topics  . Alcohol use: No    Alcohol/week: 0.0 oz  . Drug use: No     Colonoscopy:  PAP:  Bone density:  Lipid panel:  No Known Allergies  Current  Outpatient Medications  Medication Sig Dispense Refill  . amLODipine (NORVASC) 5 MG tablet TAKE 1 TABLET DAILY 90 tablet 1  . atorvastatin (LIPITOR) 10 MG tablet TAKE 1 TABLET DAILY AT 6 P.M. 90 tablet 1  . levothyroxine (SYNTHROID, LEVOTHROID) 50 MCG tablet TAKE 1 TABLET DAILY 90 tablet 3  . lisinopril (PRINIVIL,ZESTRIL) 20 MG tablet TAKE 1 TABLET DAILY 90 tablet 1  . megestrol (MEGACE) 40 MG tablet Take 1 tablet (40 mg total) by mouth daily.  (Patient not taking: Reported on 12/16/2017) 30 tablet 2  . sucralfate (CARAFATE) 1 g tablet Take 1 tablet (1 g total) by mouth 3 (three) times daily before meals. (Patient not taking: Reported on 12/16/2017) 90 tablet 3  . traMADol (ULTRAM) 50 MG tablet Take 1 tablet (50 mg total) by mouth 2 (two) times daily. (Patient not taking: Reported on 12/16/2017) 60 tablet 0   No current facility-administered medications for this visit.    Facility-Administered Medications Ordered in Other Visits  Medication Dose Route Frequency Provider Last Rate Last Dose  . 0.9 %  sodium chloride infusion   Intravenous Once Lloyd Huger, MD      . durvalumab (IMFINZI) 560 mg in sodium chloride 0.9 % 100 mL chemo infusion  560 mg Intravenous Once Lloyd Huger, MD      . heparin lock flush 100 unit/mL  500 Units Intracatheter Once PRN Lloyd Huger, MD      . sodium chloride flush (NS) 0.9 % injection 10 mL  10 mL Intravenous PRN Lloyd Huger, MD   10 mL at 12/16/17 0915  . sodium chloride flush (NS) 0.9 % injection 10 mL  10 mL Intracatheter PRN Lloyd Huger, MD        OBJECTIVE: Vitals:   12/16/17 0935  BP: 101/63  Pulse: 78  Resp: 18  Temp: (!) 96.3 F (35.7 C)     Body mass index is 24.1 kg/m.    ECOG FS:0 - Asymptomatic  General: Well-developed, well-nourished, no acute distress. Eyes: Pink conjunctiva, anicteric sclera. Lungs: Clear to auscultation bilaterally. Heart: Regular rate and rhythm. No rubs, murmurs, or gallops. Abdomen: Soft, nontender, nondistended. No organomegaly noted, normoactive bowel sounds. Musculoskeletal: No edema, cyanosis, or clubbing. Neuro: Alert, answering all questions appropriately. Cranial nerves grossly intact. Skin: No rashes or petechiae noted. Psych: Normal affect.  LAB RESULTS:  Lab Results  Component Value Date   NA 138 12/16/2017   K 4.1 12/16/2017   CL 105 12/16/2017   CO2 24 12/16/2017   GLUCOSE 88 12/16/2017   BUN 11  12/16/2017   CREATININE 0.55 12/16/2017   CALCIUM 9.0 12/16/2017   PROT 7.1 12/16/2017   ALBUMIN 3.4 (L) 12/16/2017   AST 23 12/16/2017   ALT 12 (L) 12/16/2017   ALKPHOS 74 12/16/2017   BILITOT 0.2 (L) 12/16/2017   GFRNONAA >60 12/16/2017   GFRAA >60 12/16/2017    Lab Results  Component Value Date   WBC 3.2 (L) 12/16/2017   NEUTROABS 2.1 12/16/2017   HGB 10.0 (L) 12/16/2017   HCT 29.6 (L) 12/16/2017   MCV 95.4 12/16/2017   PLT 439 12/16/2017     STUDIES: No results found.  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  PLAN:    1. 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  Patient states her last infusion of carboplatinum and Taxol on October 07, 2017.  She has now completed her XRT.  Proceed with cycle 4 of maintenance Durvalumab today.  Patient will receive treatment every 2 weeks for a total of 12 months.  Return to clinic in 2 weeks for consideration of cycle 5 and then in 4 weeks for further evaluation and consideration of cycle 6.  Exline 2. Leukopenia: Mild, monitor. 3. Poor appetite: Resolved. 4.  Anemia: Improving, monitor.  Approximately 30 minutes spent in discussion of which greater than 50% was consultation.  Patient expressed understanding and was in agreement with this plan. She also understands that She can call clinic at any time with any questions, concerns, or complaints.   Cancer Staging Cancer of upper lobe of right lung [facility]) Staging form: Lung, AJCC 8th Edition - Clinical stage from 08/24/2017: Stage IIIB (cT2a, cN3, cM0) - Signed by Lloyd Huger, MD on 08/24/2017   Lloyd Huger, MD   12/16/2017 10:22 AM

## 2017-12-16 ENCOUNTER — Inpatient Hospital Stay: Payer: Managed Care, Other (non HMO) | Attending: Oncology

## 2017-12-16 ENCOUNTER — Inpatient Hospital Stay: Payer: Managed Care, Other (non HMO)

## 2017-12-16 ENCOUNTER — Encounter: Payer: Self-pay | Admitting: Oncology

## 2017-12-16 ENCOUNTER — Inpatient Hospital Stay (HOSPITAL_BASED_OUTPATIENT_CLINIC_OR_DEPARTMENT_OTHER): Payer: Managed Care, Other (non HMO) | Admitting: Oncology

## 2017-12-16 ENCOUNTER — Other Ambulatory Visit: Payer: Self-pay

## 2017-12-16 VITALS — BP 101/63 | HR 78 | Temp 96.3°F | Resp 18 | Wt 123.4 lb

## 2017-12-16 DIAGNOSIS — D649 Anemia, unspecified: Secondary | ICD-10-CM | POA: Diagnosis not present

## 2017-12-16 DIAGNOSIS — I10 Essential (primary) hypertension: Secondary | ICD-10-CM | POA: Insufficient documentation

## 2017-12-16 DIAGNOSIS — E785 Hyperlipidemia, unspecified: Secondary | ICD-10-CM

## 2017-12-16 DIAGNOSIS — Z87891 Personal history of nicotine dependence: Secondary | ICD-10-CM | POA: Insufficient documentation

## 2017-12-16 DIAGNOSIS — E039 Hypothyroidism, unspecified: Secondary | ICD-10-CM | POA: Insufficient documentation

## 2017-12-16 DIAGNOSIS — M1990 Unspecified osteoarthritis, unspecified site: Secondary | ICD-10-CM | POA: Insufficient documentation

## 2017-12-16 DIAGNOSIS — C3411 Malignant neoplasm of upper lobe, right bronchus or lung: Secondary | ICD-10-CM | POA: Diagnosis not present

## 2017-12-16 DIAGNOSIS — Z5112 Encounter for antineoplastic immunotherapy: Secondary | ICD-10-CM | POA: Insufficient documentation

## 2017-12-16 DIAGNOSIS — Z79899 Other long term (current) drug therapy: Secondary | ICD-10-CM | POA: Insufficient documentation

## 2017-12-16 DIAGNOSIS — D72819 Decreased white blood cell count, unspecified: Secondary | ICD-10-CM

## 2017-12-16 DIAGNOSIS — Z923 Personal history of irradiation: Secondary | ICD-10-CM | POA: Insufficient documentation

## 2017-12-16 LAB — CBC WITH DIFFERENTIAL/PLATELET
Basophils Absolute: 0 10*3/uL (ref 0–0.1)
Basophils Relative: 1 %
EOS PCT: 4 %
Eosinophils Absolute: 0.1 10*3/uL (ref 0–0.7)
HCT: 29.6 % — ABNORMAL LOW (ref 35.0–47.0)
HEMOGLOBIN: 10 g/dL — AB (ref 12.0–16.0)
LYMPHS ABS: 0.5 10*3/uL — AB (ref 1.0–3.6)
LYMPHS PCT: 14 %
MCH: 32.1 pg (ref 26.0–34.0)
MCHC: 33.7 g/dL (ref 32.0–36.0)
MCV: 95.4 fL (ref 80.0–100.0)
MONO ABS: 0.5 10*3/uL (ref 0.2–0.9)
Monocytes Relative: 14 %
NEUTROS ABS: 2.1 10*3/uL (ref 1.4–6.5)
Neutrophils Relative %: 67 %
PLATELETS: 439 10*3/uL (ref 150–440)
RBC: 3.1 MIL/uL — AB (ref 3.80–5.20)
RDW: 15.6 % — ABNORMAL HIGH (ref 11.5–14.5)
WBC: 3.2 10*3/uL — ABNORMAL LOW (ref 3.6–11.0)

## 2017-12-16 LAB — COMPREHENSIVE METABOLIC PANEL
ALT: 12 U/L — ABNORMAL LOW (ref 14–54)
ANION GAP: 9 (ref 5–15)
AST: 23 U/L (ref 15–41)
Albumin: 3.4 g/dL — ABNORMAL LOW (ref 3.5–5.0)
Alkaline Phosphatase: 74 U/L (ref 38–126)
BUN: 11 mg/dL (ref 6–20)
CHLORIDE: 105 mmol/L (ref 101–111)
CO2: 24 mmol/L (ref 22–32)
Calcium: 9 mg/dL (ref 8.9–10.3)
Creatinine, Ser: 0.55 mg/dL (ref 0.44–1.00)
GFR calc Af Amer: >60 mL/min (ref >60)
GFR calc non Af Amer: >60 mL/min (ref >60)
Glucose, Bld: 88 mg/dL (ref 65–99)
POTASSIUM: 4.1 mmol/L (ref 3.5–5.1)
SODIUM: 138 mmol/L (ref 135–145)
Total Bilirubin: 0.2 mg/dL — ABNORMAL LOW (ref 0.3–1.2)
Total Protein: 7.1 g/dL (ref 6.5–8.1)

## 2017-12-16 MED ORDER — HEPARIN SOD (PORK) LOCK FLUSH 100 UNIT/ML IV SOLN
500.0000 [IU] | Freq: Once | INTRAVENOUS | Status: AC | PRN
  Administered 2017-12-16: 500 [IU]
  Filled 2017-12-16: qty 5

## 2017-12-16 MED ORDER — SODIUM CHLORIDE 0.9% FLUSH
10.0000 mL | INTRAVENOUS | Status: DC | PRN
  Filled 2017-12-16: qty 10

## 2017-12-16 MED ORDER — SODIUM CHLORIDE 0.9 % IV SOLN
560.0000 mg | Freq: Once | INTRAVENOUS | Status: AC
  Administered 2017-12-16: 560 mg via INTRAVENOUS
  Filled 2017-12-16: qty 10

## 2017-12-16 MED ORDER — SODIUM CHLORIDE 0.9% FLUSH
10.0000 mL | INTRAVENOUS | Status: DC | PRN
  Administered 2017-12-16: 10 mL via INTRAVENOUS
  Filled 2017-12-16: qty 10

## 2017-12-16 MED ORDER — SODIUM CHLORIDE 0.9 % IV SOLN
Freq: Once | INTRAVENOUS | Status: AC
  Administered 2017-12-16: 10:00:00 via INTRAVENOUS
  Filled 2017-12-16: qty 1000

## 2017-12-16 MED ORDER — SODIUM CHLORIDE 0.9 % IV SOLN: 10.0000 mg/kg | Freq: Once | INTRAVENOUS | Status: DC

## 2017-12-16 NOTE — Progress Notes (Signed)
Here for follow up. Per pt appetite much better w weight gain. 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

## 2017-12-17 LAB — THYROID PANEL WITH TSH
FREE THYROXINE INDEX: 1.7 (ref 1.2–4.9)
T3 Uptake Ratio: 22 % — ABNORMAL LOW (ref 24–39)
T4, Total: 7.8 ug/dL (ref 4.5–12.0)
TSH: 1.28 u[IU]/mL (ref 0.450–4.500)

## 2017-12-30 ENCOUNTER — Encounter: Payer: Self-pay | Admitting: Radiation Oncology

## 2017-12-30 ENCOUNTER — Inpatient Hospital Stay: Payer: Managed Care, Other (non HMO)

## 2017-12-30 ENCOUNTER — Other Ambulatory Visit: Payer: Self-pay

## 2017-12-30 ENCOUNTER — Ambulatory Visit
Admission: RE | Admit: 2017-12-30 | Discharge: 2017-12-30 | Disposition: A | Discharge status: Home / Self Care | Payer: Managed Care, Other (non HMO) | Source: Ambulatory Visit | Attending: Radiation Oncology | Admitting: Radiation Oncology

## 2017-12-30 VITALS — BP 96/58 | HR 90 | Temp 96.7°F | Resp 20 | Wt 126.3 lb

## 2017-12-30 DIAGNOSIS — C3490 Malignant neoplasm of unspecified part of unspecified bronchus or lung: Secondary | ICD-10-CM | POA: Insufficient documentation

## 2017-12-30 DIAGNOSIS — C3411 Malignant neoplasm of upper lobe, right bronchus or lung: Secondary | ICD-10-CM | POA: Diagnosis not present

## 2017-12-30 DIAGNOSIS — Z9889 Other specified postprocedural states: Secondary | ICD-10-CM | POA: Diagnosis not present

## 2017-12-30 LAB — COMPREHENSIVE METABOLIC PANEL
ALBUMIN: 3.7 g/dL (ref 3.5–5.0)
ALT: 14 U/L (ref 14–54)
ANION GAP: 8 (ref 5–15)
AST: 22 U/L (ref 15–41)
Alkaline Phosphatase: 80 U/L (ref 38–126)
BILIRUBIN TOTAL: 0.2 mg/dL — AB (ref 0.3–1.2)
BUN: 19 mg/dL (ref 6–20)
CO2: 26 mmol/L (ref 22–32)
Calcium: 8.9 mg/dL (ref 8.9–10.3)
Chloride: 106 mmol/L (ref 101–111)
Creatinine, Ser: 1.05 mg/dL — ABNORMAL HIGH (ref 0.44–1.00)
GFR calc Af Amer: >60 mL/min (ref >60)
GFR, EST NON AFRICAN AMERICAN: 56 mL/min — AB (ref >60)
GLUCOSE: 136 mg/dL — AB (ref 65–99)
POTASSIUM: 4.8 mmol/L (ref 3.5–5.1)
Sodium: 140 mmol/L (ref 135–145)
TOTAL PROTEIN: 7.1 g/dL (ref 6.5–8.1)

## 2017-12-30 LAB — CBC WITH DIFFERENTIAL/PLATELET
BASOS ABS: 0 10*3/uL (ref 0–0.1)
Basophils Relative: 0 %
EOS PCT: 2 %
Eosinophils Absolute: 0.1 10*3/uL (ref 0–0.7)
HEMATOCRIT: 29.7 % — AB (ref 35.0–47.0)
HEMOGLOBIN: 10.1 g/dL — AB (ref 12.0–16.0)
LYMPHS PCT: 17 %
Lymphs Abs: 0.7 10*3/uL — ABNORMAL LOW (ref 1.0–3.6)
MCH: 32.1 pg (ref 26.0–34.0)
MCHC: 33.9 g/dL (ref 32.0–36.0)
MCV: 94.7 fL (ref 80.0–100.0)
Monocytes Absolute: 0.3 10*3/uL (ref 0.2–0.9)
Monocytes Relative: 7 %
NEUTROS ABS: 3 10*3/uL (ref 1.4–6.5)
Neutrophils Relative %: 74 %
Platelets: 312 10*3/uL (ref 150–440)
RBC: 3.13 MIL/uL — AB (ref 3.80–5.20)
RDW: 15.3 % — ABNORMAL HIGH (ref 11.5–14.5)
WBC: 4.1 10*3/uL (ref 3.6–11.0)

## 2017-12-30 MED ORDER — HEPARIN SOD (PORK) LOCK FLUSH 100 UNIT/ML IV SOLN
500.0000 [IU] | Freq: Once | INTRAVENOUS | Status: AC
  Administered 2017-12-30: 500 [IU] via INTRAVENOUS
  Filled 2017-12-30: qty 5

## 2017-12-30 MED ORDER — SODIUM CHLORIDE 0.9 % IV SOLN
Freq: Once | INTRAVENOUS | Status: AC
  Administered 2017-12-30: 14:00:00 via INTRAVENOUS
  Filled 2017-12-30: qty 1000

## 2017-12-30 MED ORDER — SODIUM CHLORIDE 0.9% FLUSH
10.0000 mL | Freq: Once | INTRAVENOUS | Status: AC
  Administered 2017-12-30: 10 mL via INTRAVENOUS
  Filled 2017-12-30: qty 10

## 2017-12-30 MED ORDER — SODIUM CHLORIDE 0.9 % IV SOLN
620.0000 mg | Freq: Once | INTRAVENOUS | Status: AC
  Administered 2017-12-30: 620 mg via INTRAVENOUS
  Filled 2017-12-30: qty 10

## 2017-12-30 MED ORDER — SODIUM CHLORIDE 0.9 % IV SOLN: 10.0000 mg/kg | Freq: Once | INTRAVENOUS | Status: DC

## 2017-12-30 NOTE — Progress Notes (Signed)
Radiation Oncology Follow up Note  Name: Janet [name]   Date:   12/30/2017 MRN:  [id number] DOB: 03/18/1956    This 62 y.o. female presents to the clinic today for one-month follow-up status post split course concurrent chemoradiation for stage IIIB (T3 N3 M0) adenocarcinoma the right upper lobe.  REFERRING PROVIDER: Kathrine Haddock, NP  HPI: Janet [name] is a 62 year old female now seen out 1 month having completed concurrent chemoradiation therapy a split course fashion for stage IIIB adenocarcinoma the lung..she is seen today in routine follow-up is doing well she specifically denies cough hemoptysis chest tightness or dysphagia. She is currently on maintenance rDurvalumab which she is tolerating well. She has not had a recent imaging.    COMPLICATIONS OF TREATMENT: none  FOLLOW UP COMPLIANCE: keeps appointments   PHYSICAL EXAM:  BP (!) 96/58   Pulse 90   Temp (!) 96.7 F (35.9 C)   Resp 20   Wt 126 lb 5.2 oz (57.3 kg)   LMP 08/08/2003 (Approximate) Comment: Hysterectomy 2004  BMI 24.67 kg/m  Well-developed well-nourished patient in NAD. HEENT reveals PERLA, EOMI, discs not visualized.  Oral cavity is clear. No oral mucosal lesions are identified. Neck is clear without evidence of cervical or supraclavicular adenopathy. Lungs are clear to A&P. Cardiac examination is essentially unremarkable with regular rate and rhythm without murmur rub or thrill. Abdomen is benign with no organomegaly or masses noted. Motor sensory and DTR levels are equal and symmetric in the upper and lower extremities. Cranial nerves II through XII are grossly intact. Proprioception is intact. No peripheral adenopathy or edema is identified. No motor or sensory levels are noted. Crude visual fields are within normal range.  RADIOLOGY RESULTS: no current films for review  PLAN: at the present time patient is doing well she continues on immunotherapy treatment. I'm please were overall progress. I've asked to see  her back in 3 months and will expect to do some agent imaging prior to that visit. Patient continues close follow-up care with medical oncology. She knows to call with any concerns.  I would like to take this opportunity to thank you for allowing me to participate in the care of your patient.Noreene Filbert, MD

## 2017-12-31 ENCOUNTER — Ambulatory Visit: Payer: Managed Care, Other (non HMO) | Admitting: Radiation Oncology

## 2018-01-06 ENCOUNTER — Encounter: Payer: Self-pay | Admitting: Unknown Physician Specialty

## 2018-01-06 ENCOUNTER — Ambulatory Visit (INDEPENDENT_AMBULATORY_CARE_PROVIDER_SITE_OTHER): Payer: Managed Care, Other (non HMO) | Admitting: Unknown Physician Specialty

## 2018-01-06 DIAGNOSIS — I10 Essential (primary) hypertension: Secondary | ICD-10-CM | POA: Diagnosis not present

## 2018-01-06 DIAGNOSIS — E785 Hyperlipidemia, unspecified: Secondary | ICD-10-CM

## 2018-01-06 DIAGNOSIS — E039 Hypothyroidism, unspecified: Secondary | ICD-10-CM

## 2018-01-06 NOTE — Assessment & Plan Note (Signed)
Good numbers last visit.  Will recheck in 6 months

## 2018-01-06 NOTE — Assessment & Plan Note (Signed)
Euthyroid.  Continue with same dose of levothyroxine.

## 2018-01-06 NOTE — Progress Notes (Signed)
BP 103/65   Pulse 70   Temp 98.8 F (37.1 C) (Oral)   Ht 4' 10.75" (1.492 m)   Wt 131 lb 12.8 oz (59.8 kg)   LMP 08/08/2003 (Approximate) Comment: Hysterectomy 2004  SpO2 97%   BMI 26.85 kg/m    Subjective:    Patient ID: Janet [name], female    DOB: 1955/11/21, 62 y.o.   MRN: [id number]  HPI: Janet [name] is a 62 y.o. female  Chief Complaint  Patient presents with  . Hyperlipidemia  . Hypertension  . Hypothyroidism   Pt with lung cancer her for f/u of other chronic conditions.  She states she is doing well with her chemotherapy treatments.    Hypertension Using medications without difficulty Average home BPs   Sometimes she is light-headed especially when stading No chest pain with exertion or shortness of breath No Edema  Hyperlipidemia Using medications without problems: No Muscle aches   Hypothyroid Pt with understandable issues with fatigue secondary to lung cancer and treatments.  Expected weight changes.  TSH done at the cancer center and labs WNL  Relevant past medical, surgical, family and social history reviewed and updated as indicated. Interim medical history since our last visit reviewed. Allergies and medications reviewed and updated.  Review of Systems  Per HPI unless specifically indicated above     Objective:    BP 103/65   Pulse 70   Temp 98.8 F (37.1 C) (Oral)   Ht 4' 10.75" (1.492 m)   Wt 131 lb 12.8 oz (59.8 kg)   LMP 08/08/2003 (Approximate) Comment: Hysterectomy 2004  SpO2 97%   BMI 26.85 kg/m   Wt Readings from Last 3 Encounters:  01/06/18 131 lb 12.8 oz (59.8 kg)  12/30/17 126 lb 5.2 oz (57.3 kg)  12/16/17 123 lb 6.4 oz (56 kg)    Physical Exam  Constitutional: She is oriented to person, place, and time. She appears well-developed and well-nourished. No distress.  HENT:  Head: Normocephalic and atraumatic.  Eyes: Conjunctivae and lids are normal. Right eye exhibits no discharge. Left eye exhibits no discharge.  No scleral icterus.  Neck: Normal range of motion. Neck supple. No JVD present. Carotid bruit is not present.  Cardiovascular: Normal rate, regular rhythm and normal heart sounds.  Pulmonary/Chest: Effort normal and breath sounds normal.  Abdominal: Normal appearance. There is no splenomegaly or hepatomegaly.  Musculoskeletal: Normal range of motion.  Neurological: She is alert and oriented to person, place, and time.  Skin: Skin is warm, dry and intact. No rash noted. No pallor.  Psychiatric: She has a normal mood and affect. Her behavior is normal. Judgment and thought content normal.    Results for orders placed or performed in visit on 12/30/17  Comprehensive metabolic panel  Result Value Ref Range   Sodium 140 135 - 145 mmol/L   Potassium 4.8 3.5 - 5.1 mmol/L   Chloride 106 101 - 111 mmol/L   CO2 26 22 - 32 mmol/L   Glucose, Bld 136 (H) 65 - 99 mg/dL   BUN 19 6 - 20 mg/dL   Creatinine, Ser 1.05 (H) 0.44 - 1.00 mg/dL   Calcium 8.9 8.9 - 10.3 mg/dL   Total Protein 7.1 6.5 - 8.1 g/dL   Albumin 3.7 3.5 - 5.0 g/dL   AST 22 15 - 41 U/L   ALT 14 14 - 54 U/L   Alkaline Phosphatase 80 38 - 126 U/L   Total Bilirubin 0.2 (L) 0.3 - 1.2 mg/dL  GFR calc non Af Amer 56 (L) >60 mL/min   GFR calc Af Amer >60 >60 mL/min   Anion gap 8 5 - 15  CBC with Differential  Result Value Ref Range   WBC 4.1 3.6 - 11.0 K/uL   RBC 3.13 (L) 3.80 - 5.20 MIL/uL   Hemoglobin 10.1 (L) 12.0 - 16.0 g/dL   HCT 29.7 (L) 35.0 - 47.0 %   MCV 94.7 80.0 - 100.0 fL   MCH 32.1 26.0 - 34.0 pg   MCHC 33.9 32.0 - 36.0 g/dL   RDW 15.3 (H) 11.5 - 14.5 %   Platelets 312 150 - 440 K/uL   Neutrophils Relative % 74 %   Neutro Abs 3.0 1.4 - 6.5 K/uL   Lymphocytes Relative 17 %   Lymphs Abs 0.7 (L) 1.0 - 3.6 K/uL   Monocytes Relative 7 %   Monocytes Absolute 0.3 0.2 - 0.9 K/uL   Eosinophils Relative 2 %   Eosinophils Absolute 0.1 0 - 0.7 K/uL   Basophils Relative 0 %   Basophils Absolute 0.0 0 - 0.1 K/uL        Assessment & Plan:   Problem List Items Addressed This Visit      Unprioritized   Hyperlipidemia    Good numbers last visit.  Will recheck in 6 months      Hypertension    Low normal BP.  Stop Lisinopril.  She will get checked at the cancer center and let us know if it's high.  CMP done through cancer center.  WNL      Hypothyroidism    Euthyroid.  Continue with same dose of levothyroxine.            Follow up plan: Return in about 3 months (around 04/08/2018), or if symptoms worsen or fail to improve.

## 2018-01-06 NOTE — Assessment & Plan Note (Addendum)
Low normal BP.  Stop Lisinopril.  She will get checked at the cancer center and let us know if it's high.  CMP done through cancer center.  WNL

## 2018-01-10 NOTE — Progress Notes (Signed)
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  INTERVAL HISTORY: Patient returns to clinic today for further evaluation and consideration of cycle 6 of maintenance Durvalumab.  She currently feels well and is asymptomatic.  She remains active and is back to work full-time. She has no neurologic complaints.  She denies any recent fevers or illnesses. She denies any chest pain, shortness of breath, hemoptysis, or cough.  She has no nausea, vomiting, constipation, or diarrhea.  She has no melena or hematochezia.  She has no urinary complaints.  Patient feels at her baseline offers no specific complaints today.   REVIEW OF SYSTEMS:   Review of Systems  Constitutional: Negative.  Negative for fever, malaise/fatigue and weight loss.  Respiratory: Negative.  Negative for cough and shortness of breath.   Cardiovascular: Negative.  Negative for chest pain and leg swelling.  Gastrointestinal: Negative.  Negative for abdominal pain, blood in stool and melena.  Genitourinary: Negative.  Negative for dysuria.  Musculoskeletal: Negative.  Negative for back pain.  Skin: Negative.  Negative for rash.  Neurological: Negative.  Negative for sensory change, focal weakness and weakness.  Psychiatric/Behavioral: Negative.  The patient is not nervous/anxious.     As per HPI. Otherwise, a complete review of systems is negative.  PAST MEDICAL HISTORY: Past Medical History:  Diagnosis Date  . Arthritis    right shoulder  . Cancer ([place])    right lung  . Hyperlipidemia   . Hypertension   . Hypothyroidism   . Menopausal state   . Thyroid disease     PAST SURGICAL HISTORY: Past Surgical History:  Procedure  Laterality Date  . ABDOMINAL HYSTERECTOMY  2005  . BREAST EXCISIONAL BIOPSY Right 1979  . COLONOSCOPY WITH PROPOFOL N/A 05/13/2017   Procedure: COLONOSCOPY WITH PROPOFOL;  Surgeon: [name], MD;  Location: [facility] ENDOSCOPY;  Service: Gastroenterology;  Laterality: N/A;  . CYSTECTOMY Right    breast  . CYSTECTOMY  02/2015   back of neck  . ESOPHAGOGASTRODUODENOSCOPY (EGD) WITH PROPOFOL N/A 05/13/2017   Procedure: ESOPHAGOGASTRODUODENOSCOPY (EGD) WITH PROPOFOL;  Surgeon: [name], MD;  Location: [facility] ENDOSCOPY;  Service: Gastroenterology;  Laterality: N/A;  . PORTA CATH INSERTION N/A 09/01/2017   Procedure: PORTA CATH INSERTION;  Surgeon: Algernon Huxley, MD;  Location: [place] CV LAB;  Service: Cardiovascular;  Laterality: N/A;  . TUBAL LIGATION  1986    FAMILY HISTORY: Family History  Problem Relation Age of Onset  . Hypertension Mother   . Stroke Mother   . Leukemia Mother   . Stroke Father   . Pneumonia Father   . Diabetes Sister   . Hyperlipidemia Sister   . Breast cancer Maternal Aunt 50    ADVANCED DIRECTIVES (Y/N):  N  HEALTH MAINTENANCE: Social History   Tobacco Use  . Smoking status: Former Smoker    Packs/day: 0.25    Types: Cigarettes    Last attempt to quit: 02/01/2017    Years since quitting: 0.9  . Smokeless tobacco: Never Used  Substance Use Topics  . Alcohol use: No    Alcohol/week: 0.0 oz  . Drug use: No     Colonoscopy:  PAP:  Bone density:  Lipid panel:  No Known Allergies  Current Outpatient Medications  Medication Sig Dispense Refill  . amLODipine (NORVASC) 5 MG tablet TAKE 1 TABLET DAILY 90 tablet 1  . atorvastatin (LIPITOR) 10 MG tablet TAKE 1 TABLET DAILY AT 6 P.M. 90 tablet 1  . levothyroxine (SYNTHROID, LEVOTHROID) 50 MCG tablet TAKE 1 TABLET DAILY 90 tablet 3   No current facility-administered medications for this visit.     OBJECTIVE: Vitals:   01/13/18 1413 01/13/18 1416  BP:  104/67  Pulse:  68  Resp: 12   Temp:  97.8  F (36.6 C)     Body mass index is 24.9 kg/m.    ECOG FS:0 - Asymptomatic  General: Well-developed, well-nourished, no acute distress. Eyes: Pink conjunctiva, anicteric sclera. Lungs: Clear to auscultation bilaterally. Heart: Regular rate and rhythm. No rubs, murmurs, or gallops. Abdomen: Soft, nontender, nondistended. No organomegaly noted, normoactive bowel sounds. Musculoskeletal: No edema, cyanosis, or clubbing. Neuro: Alert, answering all questions appropriately. Cranial nerves grossly intact. Skin: No rashes or petechiae noted. Psych: Normal affect.  LAB RESULTS:  Lab Results  Component Value Date   NA 139 01/13/2018   K 4.2 01/13/2018   CL 107 01/13/2018   CO2 27 01/13/2018   GLUCOSE 98 01/13/2018   BUN 15 01/13/2018   CREATININE 0.70 01/13/2018   CALCIUM 9.1 01/13/2018   PROT 6.8 01/13/2018   ALBUMIN 3.8 01/13/2018   AST 17 01/13/2018   ALT 14 01/13/2018   ALKPHOS 69 01/13/2018   BILITOT 0.5 01/13/2018   GFRNONAA >60 01/13/2018   GFRAA >60 01/13/2018    Lab Results  Component Value Date   WBC 3.1 (L) 01/13/2018   NEUTROABS 2.0 01/13/2018   HGB 9.7 (L) 01/13/2018   HCT 28.6 (L) 01/13/2018   MCV 94.1 01/13/2018   PLT 259 01/13/2018     STUDIES: No results found.  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  PLAN:    1. 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  Patient completed her last infusion of carboplatinum and Taxol on October 07, 2017.  She has now completed her XRT.  Proceed with cycle 6 of maintenance Durvalumab today.  Patient will receive treatment every 2 weeks for a total of 12 months.  Return to clinic in 2 weeks for consideration of cycle 7 and then in 4 weeks for further evaluation and consideration of cycle 8. 2. Leukopenia: Mild, monitor. 3.  Anemia: Patient's hemoglobin is decreased, but stable.   Monitor.  Approximately 30 minutes was spent in discussion of which greater than 50% was consultation.   Patient expressed understanding and was in agreement with this plan. She also understands that She can call clinic at any time with any questions, concerns, or complaints.   Cancer Staging Cancer of upper lobe of right lung [facility]) Staging form: Lung, AJCC 8th Edition - Clinical stage from 08/24/2017: Stage IIIB (cT2a, cN3, cM0) - Signed by Lloyd Huger, MD on 08/24/2017   Lloyd Huger, MD   01/17/2018 6:46 AM

## 2018-01-13 ENCOUNTER — Inpatient Hospital Stay (HOSPITAL_BASED_OUTPATIENT_CLINIC_OR_DEPARTMENT_OTHER): Payer: Managed Care, Other (non HMO) | Admitting: Oncology

## 2018-01-13 ENCOUNTER — Inpatient Hospital Stay: Payer: Managed Care, Other (non HMO)

## 2018-01-13 ENCOUNTER — Inpatient Hospital Stay: Payer: Managed Care, Other (non HMO) | Attending: Oncology

## 2018-01-13 ENCOUNTER — Encounter: Payer: Self-pay | Admitting: Oncology

## 2018-01-13 ENCOUNTER — Other Ambulatory Visit: Payer: Self-pay

## 2018-01-13 VITALS — BP 104/67 | HR 68 | Temp 97.8°F | Resp 12 | Ht 60.0 in | Wt 127.5 lb

## 2018-01-13 DIAGNOSIS — E039 Hypothyroidism, unspecified: Secondary | ICD-10-CM

## 2018-01-13 DIAGNOSIS — Z780 Asymptomatic menopausal state: Secondary | ICD-10-CM | POA: Diagnosis not present

## 2018-01-13 DIAGNOSIS — D72819 Decreased white blood cell count, unspecified: Secondary | ICD-10-CM | POA: Diagnosis not present

## 2018-01-13 DIAGNOSIS — M1990 Unspecified osteoarthritis, unspecified site: Secondary | ICD-10-CM | POA: Diagnosis not present

## 2018-01-13 DIAGNOSIS — C3411 Malignant neoplasm of upper lobe, right bronchus or lung: Secondary | ICD-10-CM | POA: Insufficient documentation

## 2018-01-13 DIAGNOSIS — I10 Essential (primary) hypertension: Secondary | ICD-10-CM | POA: Insufficient documentation

## 2018-01-13 DIAGNOSIS — D649 Anemia, unspecified: Secondary | ICD-10-CM | POA: Insufficient documentation

## 2018-01-13 DIAGNOSIS — E785 Hyperlipidemia, unspecified: Secondary | ICD-10-CM | POA: Insufficient documentation

## 2018-01-13 DIAGNOSIS — Z79899 Other long term (current) drug therapy: Secondary | ICD-10-CM | POA: Insufficient documentation

## 2018-01-13 DIAGNOSIS — Z5112 Encounter for antineoplastic immunotherapy: Secondary | ICD-10-CM | POA: Insufficient documentation

## 2018-01-13 DIAGNOSIS — Z87891 Personal history of nicotine dependence: Secondary | ICD-10-CM

## 2018-01-13 LAB — CBC WITH DIFFERENTIAL/PLATELET
Basophils Absolute: 0 10*3/uL (ref 0–0.1)
Basophils Relative: 1 %
Eosinophils Absolute: 0.1 10*3/uL (ref 0–0.7)
Eosinophils Relative: 2 %
HEMATOCRIT: 28.6 % — AB (ref 35.0–47.0)
Hemoglobin: 9.7 g/dL — ABNORMAL LOW (ref 12.0–16.0)
LYMPHS ABS: 0.7 10*3/uL — AB (ref 1.0–3.6)
Lymphocytes Relative: 21 %
MCH: 31.8 pg (ref 26.0–34.0)
MCHC: 33.7 g/dL (ref 32.0–36.0)
MCV: 94.1 fL (ref 80.0–100.0)
MONO ABS: 0.3 10*3/uL (ref 0.2–0.9)
MONOS PCT: 10 %
NEUTROS ABS: 2 10*3/uL (ref 1.4–6.5)
NEUTROS PCT: 66 %
Platelets: 259 10*3/uL (ref 150–440)
RBC: 3.04 MIL/uL — ABNORMAL LOW (ref 3.80–5.20)
RDW: 15.2 % — ABNORMAL HIGH (ref 11.5–14.5)
WBC: 3.1 10*3/uL — ABNORMAL LOW (ref 3.6–11.0)

## 2018-01-13 LAB — COMPREHENSIVE METABOLIC PANEL
ALBUMIN: 3.8 g/dL (ref 3.5–5.0)
ALT: 14 U/L (ref 14–54)
AST: 17 U/L (ref 15–41)
Alkaline Phosphatase: 69 U/L (ref 38–126)
Anion gap: 5 (ref 5–15)
BUN: 15 mg/dL (ref 6–20)
CHLORIDE: 107 mmol/L (ref 101–111)
CO2: 27 mmol/L (ref 22–32)
CREATININE: 0.7 mg/dL (ref 0.44–1.00)
Calcium: 9.1 mg/dL (ref 8.9–10.3)
GFR calc non Af Amer: >60 mL/min (ref >60)
GLUCOSE: 98 mg/dL (ref 65–99)
Potassium: 4.2 mmol/L (ref 3.5–5.1)
SODIUM: 139 mmol/L (ref 135–145)
Total Bilirubin: 0.5 mg/dL (ref 0.3–1.2)
Total Protein: 6.8 g/dL (ref 6.5–8.1)

## 2018-01-13 MED ORDER — HEPARIN SOD (PORK) LOCK FLUSH 100 UNIT/ML IV SOLN
INTRAVENOUS | Status: AC
  Filled 2018-01-13: qty 5

## 2018-01-13 MED ORDER — SODIUM CHLORIDE 0.9 % IV SOLN
Freq: Once | INTRAVENOUS | Status: AC
  Administered 2018-01-13: 15:00:00 via INTRAVENOUS
  Filled 2018-01-13: qty 1000

## 2018-01-13 MED ORDER — SODIUM CHLORIDE 0.9 % IV SOLN
620.0000 mg | Freq: Once | INTRAVENOUS | Status: AC
  Administered 2018-01-13: 620 mg via INTRAVENOUS
  Filled 2018-01-13: qty 10

## 2018-01-13 MED ORDER — HEPARIN SOD (PORK) LOCK FLUSH 100 UNIT/ML IV SOLN
500.0000 [IU] | Freq: Once | INTRAVENOUS | Status: AC
  Administered 2018-01-13: 500 [IU] via INTRAVENOUS

## 2018-01-13 MED ORDER — SODIUM CHLORIDE 0.9% FLUSH
10.0000 mL | Freq: Once | INTRAVENOUS | Status: AC
  Administered 2018-01-13: 10 mL via INTRAVENOUS
  Filled 2018-01-13: qty 10

## 2018-01-13 NOTE — Progress Notes (Signed)
Patient here for follow up no changes since last apt.

## 2018-01-14 LAB — THYROID PANEL WITH TSH
Free Thyroxine Index: 1.8 (ref 1.2–4.9)
T3 Uptake Ratio: 23 % — ABNORMAL LOW (ref 24–39)
T4, Total: 7.9 ug/dL (ref 4.5–12.0)
TSH: 1.22 u[IU]/mL (ref 0.450–4.500)

## 2018-01-27 ENCOUNTER — Inpatient Hospital Stay: Payer: Managed Care, Other (non HMO)

## 2018-01-27 DIAGNOSIS — C3411 Malignant neoplasm of upper lobe, right bronchus or lung: Secondary | ICD-10-CM | POA: Diagnosis not present

## 2018-01-27 MED ORDER — SODIUM CHLORIDE 0.9 % IV SOLN
620.0000 mg | Freq: Once | INTRAVENOUS | Status: AC
  Administered 2018-01-27: 620 mg via INTRAVENOUS
  Filled 2018-01-27: qty 2.4

## 2018-01-27 MED ORDER — SODIUM CHLORIDE 0.9 % IV SOLN: 10.0000 mg/kg | Freq: Once | INTRAVENOUS | Status: DC

## 2018-01-27 MED ORDER — HEPARIN SOD (PORK) LOCK FLUSH 100 UNIT/ML IV SOLN
500.0000 [IU] | Freq: Once | INTRAVENOUS | Status: AC | PRN
  Administered 2018-01-27: 500 [IU]
  Filled 2018-01-27: qty 5

## 2018-01-27 MED ORDER — SODIUM CHLORIDE 0.9 % IV SOLN
Freq: Once | INTRAVENOUS | Status: AC
  Administered 2018-01-27: 14:00:00 via INTRAVENOUS
  Filled 2018-01-27: qty 1000

## 2018-02-08 NOTE — Progress Notes (Signed)
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  INTERVAL HISTORY: Patient returns to clinic today for further evaluation and consideration of cycle 8 of maintenance Durvalumab.  She is complaining of right rib pain just below her breast.  She otherwise feels well and is asymptomatic.  She remains active and is back to work full-time. She has no neurologic complaints.  She denies any recent fevers or illnesses. She denies any chest pain, shortness of breath, hemoptysis, or cough.  She has no nausea, vomiting, constipation, or diarrhea.  She has no melena or hematochezia.  She has no urinary complaints.  Patient offers no further specific complaints today.  REVIEW OF SYSTEMS:   Review of Systems  Constitutional: Negative.  Negative for fever, malaise/fatigue and weight loss.  Respiratory: Negative.  Negative for cough and shortness of breath.   Cardiovascular: Negative.  Negative for chest pain and leg swelling.  Gastrointestinal: Negative.  Negative for abdominal pain, blood in stool and melena.  Genitourinary: Negative.  Negative for dysuria.  Musculoskeletal: Negative.  Negative for back pain.  Skin: Negative.  Negative for rash.  Neurological: Negative.  Negative for sensory change, focal weakness and weakness.  Psychiatric/Behavioral: Negative.  The patient is not nervous/anxious.     As per HPI. Otherwise, a complete review of systems is negative.  PAST MEDICAL HISTORY: Past Medical History:  Diagnosis Date  . Arthritis    right shoulder  . Cancer ([place])    right lung  . Hyperlipidemia   . Hypertension   . Hypothyroidism   . Menopausal state   . Thyroid disease     PAST SURGICAL  HISTORY: Past Surgical History:  Procedure Laterality Date  . ABDOMINAL HYSTERECTOMY  2005  . BREAST EXCISIONAL BIOPSY Right 1979  . COLONOSCOPY WITH PROPOFOL N/A 05/13/2017   Procedure: COLONOSCOPY WITH PROPOFOL;  Surgeon: [name], MD;  Location: [facility] ENDOSCOPY;  Service: Gastroenterology;  Laterality: N/A;  . CYSTECTOMY Right    breast  . CYSTECTOMY  02/2015   back of neck  . ESOPHAGOGASTRODUODENOSCOPY (EGD) WITH PROPOFOL N/A 05/13/2017   Procedure: ESOPHAGOGASTRODUODENOSCOPY (EGD) WITH PROPOFOL;  Surgeon: [name], MD;  Location: [facility] ENDOSCOPY;  Service: Gastroenterology;  Laterality: N/A;  . PORTA CATH INSERTION N/A 09/01/2017   Procedure: PORTA CATH INSERTION;  Surgeon: Algernon Huxley, MD;  Location: [place] CV LAB;  Service: Cardiovascular;  Laterality: N/A;  . TUBAL LIGATION  1986    FAMILY HISTORY: Family History  Problem Relation Age of Onset  . Hypertension Mother   . Stroke Mother   . Leukemia Mother   . Stroke Father   . Pneumonia Father   . Diabetes Sister   . Hyperlipidemia Sister   . Breast cancer Maternal Aunt 61    ADVANCED DIRECTIVES (Y/N):  N  HEALTH MAINTENANCE: Social History   Tobacco Use  . Smoking status: Former Smoker    Packs/day: 0.25    Types: Cigarettes    Last attempt to quit: 02/01/2017    Years since quitting: 1.0  . Smokeless tobacco: Never Used  Substance Use Topics  . Alcohol use: No    Alcohol/week: 0.0 oz  . Drug use: No     Colonoscopy:  PAP:  Bone density:  Lipid panel:  No Known Allergies  Current Outpatient Medications  Medication Sig Dispense Refill  . amLODipine (NORVASC) 5 MG tablet TAKE 1 TABLET DAILY 90 tablet 1  . atorvastatin (LIPITOR) 10 MG tablet TAKE 1 TABLET DAILY AT 6 P.M. 90 tablet 1  . levothyroxine (SYNTHROID, LEVOTHROID) 50 MCG tablet TAKE 1 TABLET DAILY 90 tablet 3   No current facility-administered medications for this visit.     OBJECTIVE: Vitals:   02/10/18 1405  BP: 115/67  Pulse:  75  Resp: 18  Temp: (!) 95.5 F (35.3 C)     Body mass index is 26.37 kg/m.    ECOG FS:0 - Asymptomatic  General: Well-developed, well-nourished, no acute distress. Eyes: Pink conjunctiva, anicteric sclera. Right flank: Point tenderness on rib just below right breast. Lungs: Clear to auscultation bilaterally. Heart: Regular rate and rhythm. No rubs, murmurs, or gallops. Abdomen: Soft, nontender, nondistended. No organomegaly noted, normoactive bowel sounds. Musculoskeletal: No edema, cyanosis, or clubbing. Neuro: Alert, answering all questions appropriately. Cranial nerves grossly intact. Skin: No rashes or petechiae noted. Psych: Normal affect.  LAB RESULTS:  Lab Results  Component Value Date   NA 139 02/10/2018   K 4.1 02/10/2018   CL 108 02/10/2018   CO2 26 02/10/2018   GLUCOSE 97 02/10/2018   BUN 17 02/10/2018   CREATININE 0.88 02/10/2018   CALCIUM 8.9 02/10/2018   PROT 7.0 02/10/2018   ALBUMIN 3.8 02/10/2018   AST 19 02/10/2018   ALT 13 02/10/2018   ALKPHOS 69 02/10/2018   BILITOT 0.4 02/10/2018   GFRNONAA >60 02/10/2018   GFRAA >60 02/10/2018    Lab Results  Component Value Date   WBC 3.4 (L) 02/10/2018   NEUTROABS 2.3 02/10/2018   HGB 10.2 (L) 02/10/2018   HCT 30.6 (L) 02/10/2018   MCV 93.6 02/10/2018   PLT 262 02/10/2018     STUDIES: No results found.  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  PLAN:    1. 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  Patient completed her last infusion of carboplatinum and Taxol on October 07, 2017.  She has now completed her XRT.  Proceed with cycle 8 of maintenance durvalumab today.  Patient will receive treatment every 2 weeks for a total of 12 months.  Return to clinic in 2 weeks for consideration of cycle 9 and then in 4 weeks for further evaluation and consideration of cycle 10.  Will  repeat imaging with CT scan 1-2 days prior to cycle 10. 2.  Leukopenia: White blood cell count is 3.4.  Monitor. 3.  Anemia: Patient's hemoglobin remains decreased to 10.2, but stable.  Monitor. 4.  Right flank/rib pain: Appears to be musculoskeletal: Recommended symptomatic treatment with OTC ibuprofen or acetominophen.   Patient expressed understanding and was in agreement with this plan. She also understands that She can call clinic at any time with any questions, concerns, or complaints.   Cancer Staging Cancer of upper lobe of right lung [facility]) Staging form: Lung, AJCC 8th Edition - Clinical stage from 08/24/2017: Stage IIIB (cT2a, cN3, cM0) - Signed by Lloyd Huger, MD on 08/24/2017   Lloyd Huger, MD   02/11/2018 10:35 AM

## 2018-02-10 ENCOUNTER — Other Ambulatory Visit: Payer: Self-pay

## 2018-02-10 ENCOUNTER — Inpatient Hospital Stay (HOSPITAL_BASED_OUTPATIENT_CLINIC_OR_DEPARTMENT_OTHER): Payer: Managed Care, Other (non HMO) | Admitting: Oncology

## 2018-02-10 ENCOUNTER — Inpatient Hospital Stay: Payer: Managed Care, Other (non HMO) | Attending: Oncology

## 2018-02-10 ENCOUNTER — Inpatient Hospital Stay: Payer: Managed Care, Other (non HMO)

## 2018-02-10 VITALS — BP 115/67 | HR 75 | Temp 95.5°F | Resp 18 | Wt 135.0 lb

## 2018-02-10 DIAGNOSIS — R0781 Pleurodynia: Secondary | ICD-10-CM | POA: Insufficient documentation

## 2018-02-10 DIAGNOSIS — D72819 Decreased white blood cell count, unspecified: Secondary | ICD-10-CM | POA: Diagnosis not present

## 2018-02-10 DIAGNOSIS — Z79899 Other long term (current) drug therapy: Secondary | ICD-10-CM

## 2018-02-10 DIAGNOSIS — M1990 Unspecified osteoarthritis, unspecified site: Secondary | ICD-10-CM | POA: Diagnosis not present

## 2018-02-10 DIAGNOSIS — D649 Anemia, unspecified: Secondary | ICD-10-CM | POA: Diagnosis not present

## 2018-02-10 DIAGNOSIS — I10 Essential (primary) hypertension: Secondary | ICD-10-CM | POA: Insufficient documentation

## 2018-02-10 DIAGNOSIS — Z803 Family history of malignant neoplasm of breast: Secondary | ICD-10-CM | POA: Diagnosis not present

## 2018-02-10 DIAGNOSIS — C3411 Malignant neoplasm of upper lobe, right bronchus or lung: Secondary | ICD-10-CM | POA: Insufficient documentation

## 2018-02-10 DIAGNOSIS — Z5112 Encounter for antineoplastic immunotherapy: Secondary | ICD-10-CM | POA: Diagnosis not present

## 2018-02-10 DIAGNOSIS — E039 Hypothyroidism, unspecified: Secondary | ICD-10-CM | POA: Diagnosis not present

## 2018-02-10 LAB — COMPREHENSIVE METABOLIC PANEL
ALT: 13 U/L (ref 0–44)
AST: 19 U/L (ref 15–41)
Albumin: 3.8 g/dL (ref 3.5–5.0)
Alkaline Phosphatase: 69 U/L (ref 38–126)
Anion gap: 5 (ref 5–15)
BUN: 17 mg/dL (ref 8–23)
CHLORIDE: 108 mmol/L (ref 98–111)
CO2: 26 mmol/L (ref 22–32)
Calcium: 8.9 mg/dL (ref 8.9–10.3)
Creatinine, Ser: 0.88 mg/dL (ref 0.44–1.00)
GFR calc Af Amer: >60 mL/min (ref >60)
GLUCOSE: 97 mg/dL (ref 70–99)
Potassium: 4.1 mmol/L (ref 3.5–5.1)
Sodium: 139 mmol/L (ref 135–145)
Total Bilirubin: 0.4 mg/dL (ref 0.3–1.2)
Total Protein: 7 g/dL (ref 6.5–8.1)

## 2018-02-10 LAB — CBC WITH DIFFERENTIAL/PLATELET
Basophils Absolute: 0 10*3/uL (ref 0–0.1)
Basophils Relative: 1 %
EOS ABS: 0.1 10*3/uL (ref 0–0.7)
EOS PCT: 2 %
HCT: 30.6 % — ABNORMAL LOW (ref 35.0–47.0)
Hemoglobin: 10.2 g/dL — ABNORMAL LOW (ref 12.0–16.0)
LYMPHS ABS: 0.7 10*3/uL — AB (ref 1.0–3.6)
LYMPHS PCT: 21 %
MCH: 31.4 pg (ref 26.0–34.0)
MCHC: 33.5 g/dL (ref 32.0–36.0)
MCV: 93.6 fL (ref 80.0–100.0)
MONO ABS: 0.3 10*3/uL (ref 0.2–0.9)
Monocytes Relative: 8 %
Neutro Abs: 2.3 10*3/uL (ref 1.4–6.5)
Neutrophils Relative %: 68 %
PLATELETS: 262 10*3/uL (ref 150–440)
RBC: 3.27 MIL/uL — AB (ref 3.80–5.20)
RDW: 14.6 % — ABNORMAL HIGH (ref 11.5–14.5)
WBC: 3.4 10*3/uL — AB (ref 3.6–11.0)

## 2018-02-10 MED ORDER — SODIUM CHLORIDE 0.9 % IV SOLN
Freq: Once | INTRAVENOUS | Status: AC
  Administered 2018-02-10: 15:00:00 via INTRAVENOUS
  Filled 2018-02-10: qty 1000

## 2018-02-10 MED ORDER — SODIUM CHLORIDE 0.9 % IV SOLN
620.0000 mg | Freq: Once | INTRAVENOUS | Status: AC
  Administered 2018-02-10: 620 mg via INTRAVENOUS
  Filled 2018-02-10: qty 12.4

## 2018-02-10 MED ORDER — SODIUM CHLORIDE 0.9% FLUSH
10.0000 mL | Freq: Once | INTRAVENOUS | Status: AC
  Administered 2018-02-10: 10 mL via INTRAVENOUS
  Filled 2018-02-10: qty 10

## 2018-02-10 MED ORDER — HEPARIN SOD (PORK) LOCK FLUSH 100 UNIT/ML IV SOLN
500.0000 [IU] | Freq: Once | INTRAVENOUS | Status: AC
  Administered 2018-02-10: 500 [IU] via INTRAVENOUS

## 2018-02-10 MED ORDER — HEPARIN SOD (PORK) LOCK FLUSH 100 UNIT/ML IV SOLN
500.0000 [IU] | Freq: Once | INTRAVENOUS | Status: DC | PRN
  Filled 2018-02-10: qty 5

## 2018-02-10 NOTE — Progress Notes (Signed)
Here for follow up   Overall stated " Im doing good " did state that " under right breast area -hurts w movt. " denies that she fell or hurt rib area. Vague c/o w movt.

## 2018-02-11 LAB — THYROID PANEL WITH TSH
FREE THYROXINE INDEX: 1.8 (ref 1.2–4.9)
T3 Uptake Ratio: 24 % (ref 24–39)
T4 TOTAL: 7.3 ug/dL (ref 4.5–12.0)
TSH: 1.09 u[IU]/mL (ref 0.450–4.500)

## 2018-02-24 ENCOUNTER — Inpatient Hospital Stay: Payer: Managed Care, Other (non HMO)

## 2018-02-24 VITALS — BP 110/70 | HR 72 | Temp 98.3°F | Resp 18 | Wt 134.0 lb

## 2018-02-24 DIAGNOSIS — C3411 Malignant neoplasm of upper lobe, right bronchus or lung: Secondary | ICD-10-CM | POA: Diagnosis not present

## 2018-02-24 LAB — CBC WITH DIFFERENTIAL/PLATELET
BASOS PCT: 1 %
Basophils Absolute: 0 10*3/uL (ref 0–0.1)
EOS ABS: 0 10*3/uL (ref 0–0.7)
Eosinophils Relative: 1 %
HCT: 33 % — ABNORMAL LOW (ref 35.0–47.0)
Hemoglobin: 11.1 g/dL — ABNORMAL LOW (ref 12.0–16.0)
LYMPHS ABS: 0.7 10*3/uL — AB (ref 1.0–3.6)
Lymphocytes Relative: 21 %
MCH: 31.4 pg (ref 26.0–34.0)
MCHC: 33.7 g/dL (ref 32.0–36.0)
MCV: 93.4 fL (ref 80.0–100.0)
Monocytes Absolute: 0.2 10*3/uL (ref 0.2–0.9)
Monocytes Relative: 7 %
Neutro Abs: 2.3 10*3/uL (ref 1.4–6.5)
Neutrophils Relative %: 70 %
PLATELETS: 291 10*3/uL (ref 150–440)
RBC: 3.54 MIL/uL — AB (ref 3.80–5.20)
RDW: 14.6 % — ABNORMAL HIGH (ref 11.5–14.5)
WBC: 3.3 10*3/uL — AB (ref 3.6–11.0)

## 2018-02-24 LAB — COMPREHENSIVE METABOLIC PANEL
ALT: 11 U/L (ref 0–44)
AST: 20 U/L (ref 15–41)
Albumin: 4.3 g/dL (ref 3.5–5.0)
Alkaline Phosphatase: 70 U/L (ref 38–126)
Anion gap: 9 (ref 5–15)
BILIRUBIN TOTAL: 0.2 mg/dL — AB (ref 0.3–1.2)
BUN: 13 mg/dL (ref 8–23)
CALCIUM: 9.1 mg/dL (ref 8.9–10.3)
CO2: 24 mmol/L (ref 22–32)
CREATININE: 0.58 mg/dL (ref 0.44–1.00)
Chloride: 107 mmol/L (ref 98–111)
Glucose, Bld: 105 mg/dL — ABNORMAL HIGH (ref 70–99)
Potassium: 3.8 mmol/L (ref 3.5–5.1)
Sodium: 140 mmol/L (ref 135–145)
Total Protein: 7.3 g/dL (ref 6.5–8.1)

## 2018-02-24 MED ORDER — SODIUM CHLORIDE 0.9 % IV SOLN
Freq: Once | INTRAVENOUS | Status: AC
  Administered 2018-02-24: 14:00:00 via INTRAVENOUS
  Filled 2018-02-24: qty 1000

## 2018-02-24 MED ORDER — HEPARIN SOD (PORK) LOCK FLUSH 100 UNIT/ML IV SOLN
500.0000 [IU] | Freq: Once | INTRAVENOUS | Status: AC | PRN
  Administered 2018-02-24: 500 [IU]
  Filled 2018-02-24: qty 5

## 2018-02-24 MED ORDER — SODIUM CHLORIDE 0.9 % IV SOLN
620.0000 mg | Freq: Once | INTRAVENOUS | Status: AC
  Administered 2018-02-24: 620 mg via INTRAVENOUS
  Filled 2018-02-24: qty 10

## 2018-02-24 MED ORDER — SODIUM CHLORIDE 0.9% FLUSH
10.0000 mL | INTRAVENOUS | Status: DC | PRN
  Administered 2018-02-24: 10 mL
  Filled 2018-02-24: qty 10

## 2018-02-25 LAB — THYROID PANEL WITH TSH
FREE THYROXINE INDEX: 1.9 (ref 1.2–4.9)
T3 Uptake Ratio: 24 % (ref 24–39)
T4, Total: 8 ug/dL (ref 4.5–12.0)
TSH: 3.4 u[IU]/mL (ref 0.450–4.500)

## 2018-03-06 ENCOUNTER — Ambulatory Visit
Admission: RE | Admit: 2018-03-06 | Discharge: 2018-03-06 | Disposition: A | Discharge status: Home / Self Care | Payer: Managed Care, Other (non HMO) | Source: Ambulatory Visit | Attending: Oncology | Admitting: Oncology

## 2018-03-06 DIAGNOSIS — I700 Atherosclerosis of aorta: Secondary | ICD-10-CM | POA: Insufficient documentation

## 2018-03-06 DIAGNOSIS — C3411 Malignant neoplasm of upper lobe, right bronchus or lung: Secondary | ICD-10-CM | POA: Insufficient documentation

## 2018-03-06 DIAGNOSIS — C787 Secondary malignant neoplasm of liver and intrahepatic bile duct: Secondary | ICD-10-CM | POA: Insufficient documentation

## 2018-03-06 HISTORY — DX: Malignant neoplasm of unspecified part of right bronchus or lung: C34.91

## 2018-03-06 MED ORDER — IOHEXOL 300 MG/ML  SOLN
75.0000 mL | Freq: Once | INTRAMUSCULAR | Status: AC | PRN
  Administered 2018-03-06: 75 mL via INTRAVENOUS

## 2018-03-09 NOTE — Progress Notes (Signed)
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  INTERVAL HISTORY: Patient returns to clinic today for further evaluation, discussion of her imaging results, and consideration of cycle 10 of maintenance Durvalumab.  She continues to have right flank pain, but otherwise feels well.  She continues to remain active and works full-time. She has no neurologic complaints.  She denies any recent fevers or illnesses. She denies any chest pain, shortness of breath, hemoptysis, or cough.  She has no nausea, vomiting, constipation, or diarrhea.  She has no melena or hematochezia.  She has no urinary complaints.  Patient offers no further specific complaints today.  REVIEW OF SYSTEMS:   Review of Systems  Constitutional: Negative.  Negative for fever, malaise/fatigue and weight loss.  Respiratory: Negative.  Negative for cough and shortness of breath.   Cardiovascular: Negative.  Negative for chest pain and leg swelling.  Gastrointestinal: Negative.  Negative for abdominal pain, blood in stool and melena.  Genitourinary: Positive for flank pain. Negative for dysuria.  Musculoskeletal: Negative for back pain.  Skin: Negative.  Negative for rash.  Neurological: Negative.  Negative for sensory change, focal weakness and weakness.  Psychiatric/Behavioral: Negative.  The patient is not nervous/anxious.     As per HPI. Otherwise, a complete review of systems is negative.  PAST MEDICAL HISTORY: Past Medical History:  Diagnosis Date  . Arthritis    right shoulder  . Cancer of right lung ([place]) 08/2017   Chemo + rad tx's.   Marland Kitchen Hyperlipidemia   . Hypertension   . Hypothyroidism   . Menopausal state   . Thyroid  disease     PAST SURGICAL HISTORY: Past Surgical History:  Procedure Laterality Date  . ABDOMINAL HYSTERECTOMY  2005  . BREAST EXCISIONAL BIOPSY Right 1979  . COLONOSCOPY WITH PROPOFOL N/A 05/13/2017   Procedure: COLONOSCOPY WITH PROPOFOL;  Surgeon: [name], MD;  Location: [facility] ENDOSCOPY;  Service: Gastroenterology;  Laterality: N/A;  . CYSTECTOMY Right    breast  . CYSTECTOMY  02/2015   back of neck  . ESOPHAGOGASTRODUODENOSCOPY (EGD) WITH PROPOFOL N/A 05/13/2017   Procedure: ESOPHAGOGASTRODUODENOSCOPY (EGD) WITH PROPOFOL;  Surgeon: [name], MD;  Location: [facility] ENDOSCOPY;  Service: Gastroenterology;  Laterality: N/A;  . PORTA CATH INSERTION N/A 09/01/2017   Procedure: PORTA CATH INSERTION;  Surgeon: Algernon Huxley, MD;  Location: [place] CV LAB;  Service: Cardiovascular;  Laterality: N/A;  . TUBAL LIGATION  1986    FAMILY HISTORY: Family History  Problem Relation Age of Onset  . Hypertension Mother   . Stroke Mother   . Leukemia Mother   . Stroke Father   . Pneumonia Father   . Diabetes Sister   . Hyperlipidemia Sister   . Breast cancer Maternal Aunt 19    ADVANCED DIRECTIVES (Y/N):  N  HEALTH MAINTENANCE: Social History   Tobacco Use  . Smoking status: Former Smoker    Packs/day: 0.25    Types: Cigarettes    Last attempt to quit: 02/01/2017    Years since quitting: 1.1  . Smokeless tobacco: Never Used  Substance Use Topics  . Alcohol use: No    Alcohol/week: 0.0 standard drinks  . Drug use: No     Colonoscopy:  PAP:  Bone density:  Lipid panel:  No Known Allergies  Current Outpatient Medications  Medication Sig Dispense Refill  . amLODipine (NORVASC) 5 MG tablet TAKE 1 TABLET DAILY 90 tablet 1  . atorvastatin (LIPITOR) 10 MG tablet TAKE 1 TABLET DAILY AT 6 P.M. 90 tablet 1  . levothyroxine (SYNTHROID, LEVOTHROID) 50 MCG tablet TAKE 1 TABLET DAILY 90 tablet 3  . oxyCODONE-acetaminophen (PERCOCET/ROXICET) 5-325 MG tablet Take 1 tablet by mouth  every 6 (six) hours as needed for severe pain. 30 tablet 0   No current facility-administered medications for this visit.     OBJECTIVE: Vitals:   03/10/18 1427  BP: 122/77  Pulse: 71  Resp: 18  Temp: (!) 96.9 F (36.1 C)  SpO2: 98%     Body mass index is 26.6 kg/m.    ECOG FS:0 - Asymptomatic  General: Well-developed, well-nourished, no acute distress. Eyes: Pink conjunctiva, anicteric sclera. HEENT: Normocephalic, moist mucous membranes. Lungs: Clear to auscultation bilaterally. Heart: Regular rate and rhythm. No rubs, murmurs, or gallops. Abdomen: Soft, nontender, nondistended. No organomegaly noted, normoactive bowel sounds. Musculoskeletal: No edema, cyanosis, or clubbing. Neuro: Alert, answering all questions appropriately. Cranial nerves grossly intact. Skin: No rashes or petechiae noted. Psych: Normal affect.  LAB RESULTS:  Lab Results  Component Value Date   NA 138 03/10/2018   K 3.7 03/10/2018   CL 105 03/10/2018   CO2 24 03/10/2018   GLUCOSE 99 03/10/2018   BUN 12 03/10/2018   CREATININE 0.78 03/10/2018   CALCIUM 9.2 03/10/2018   PROT 7.1 03/10/2018   ALBUMIN 3.9 03/10/2018   AST 18 03/10/2018   ALT 12 03/10/2018   ALKPHOS 63 03/10/2018   BILITOT 0.4 03/10/2018   GFRNONAA >60 03/10/2018   GFRAA >60 03/10/2018    Lab Results  Component Value Date   WBC 3.3 (L) 03/10/2018   NEUTROABS 2.2 03/10/2018   HGB 10.9 (L) 03/10/2018   HCT 32.7 (L) 03/10/2018   MCV 93.3 03/10/2018   PLT 266 03/10/2018     STUDIES: Ct Chest W Contrast  Result Date: 03/06/2018 CLINICAL DATA:  Restaging right lung cancer EXAM: CT CHEST WITH CONTRAST TECHNIQUE: Multidetector CT imaging of the chest was performed during intravenous contrast administration. CONTRAST:  18mL OMNIPAQUE IOHEXOL 300 MG/ML  SOLN COMPARISON:  10/31/2017 FINDINGS: Cardiovascular: Heart is normal in size.  No pericardial effusion. No evidence of thoracic aortic aneurysm. Atherosclerotic calcifications  of the aortic arch. Mild coronary atherosclerosis the LAD. Left chest port terminates cavoatrial junction. Mediastinum/Nodes: No suspicious mediastinal, hilar, or axillary lymphadenopathy. 9 mm left thyroid nodule (series 2/image 18). Lungs/Pleura: Radiation changes in the medial right upper lobe, left paramediastinal region, and superior segment right lower lobe. 10 mm right lower lobe nodule (series 3/image 81), previously 5 mm, compatible with metastasis. Additional small pulmonary nodules measuring up to 5 mm in the right middle lobe and bilateral lower lobes (series 3/images 53, 56, 59, 76, and 96), mildly progressive. No focal consolidation. No pleural effusion or pneumothorax. Upper Abdomen: At least two hepatic metastases, measuring 2.6 cm in segment 7 (series 2/image 103) and 1.7 cm in segment 4B (series 2/image 116), new. Musculoskeletal: Degenerative changes of the visualized thoracolumbar spine. IMPRESSION: Radiation changes, as described above. Bilateral pulmonary nodules/metastases, measuring up to 10 mm in the right lower lobe, mildly progressive. At least two hepatic metastases, measuring up to 2.6 cm in segment 7, new. Aortic Atherosclerosis (ICD10-I70.0). Electronically Signed   By: Julian Hy M.D.   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  PLAN:    1. 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  Patient also has 2 new hepatic nodules measuring up to 2.6 cm also suspicious for metastatic disease.  Will get PET scan for further evaluation.  Proceed with cycle 10 of maintenance durvalumab today, but patient will have to switch treatments in the near future. OmniSeq testing did not reveal any actionable mutations.  Return to clinic 1 to 2 days after PET scan to discuss the results  and treatment planning. 2.  Leukopenia: White count remains decreased at 3.3, but stable. 3.  Anemia: Mild at 10.9, monitor. 4.  Right flank/rib pain: Possibly related to liver metastasis.  PET scan as above.  Continue Percocet as prescribed.   Patient expressed understanding and was in agreement with this plan. She also understands that She can call clinic at any time with any questions, concerns, or complaints.   Cancer Staging Cancer of upper lobe of right lung [facility]) Staging form: Lung, AJCC 8th Edition - Clinical stage from 08/24/2017: Stage IIIB (cT2a, cN3, cM0) - Signed by Lloyd Huger, MD on 08/24/2017   Lloyd Huger, MD   03/14/2018 7:59 AM

## 2018-03-10 ENCOUNTER — Inpatient Hospital Stay: Payer: Managed Care, Other (non HMO) | Attending: Oncology

## 2018-03-10 ENCOUNTER — Other Ambulatory Visit: Payer: Self-pay | Admitting: *Deleted

## 2018-03-10 ENCOUNTER — Inpatient Hospital Stay: Payer: Managed Care, Other (non HMO)

## 2018-03-10 ENCOUNTER — Inpatient Hospital Stay (HOSPITAL_BASED_OUTPATIENT_CLINIC_OR_DEPARTMENT_OTHER): Payer: Managed Care, Other (non HMO) | Admitting: Oncology

## 2018-03-10 ENCOUNTER — Encounter: Payer: Self-pay | Admitting: Oncology

## 2018-03-10 VITALS — BP 122/77 | HR 71 | Temp 96.9°F | Resp 18 | Wt 136.2 lb

## 2018-03-10 DIAGNOSIS — E039 Hypothyroidism, unspecified: Secondary | ICD-10-CM | POA: Insufficient documentation

## 2018-03-10 DIAGNOSIS — E785 Hyperlipidemia, unspecified: Secondary | ICD-10-CM | POA: Diagnosis not present

## 2018-03-10 DIAGNOSIS — Z87891 Personal history of nicotine dependence: Secondary | ICD-10-CM | POA: Insufficient documentation

## 2018-03-10 DIAGNOSIS — I110 Hypertensive heart disease with heart failure: Secondary | ICD-10-CM

## 2018-03-10 DIAGNOSIS — Z79899 Other long term (current) drug therapy: Secondary | ICD-10-CM

## 2018-03-10 DIAGNOSIS — D72819 Decreased white blood cell count, unspecified: Secondary | ICD-10-CM | POA: Insufficient documentation

## 2018-03-10 DIAGNOSIS — I10 Essential (primary) hypertension: Secondary | ICD-10-CM | POA: Insufficient documentation

## 2018-03-10 DIAGNOSIS — C7951 Secondary malignant neoplasm of bone: Secondary | ICD-10-CM | POA: Diagnosis not present

## 2018-03-10 DIAGNOSIS — Z803 Family history of malignant neoplasm of breast: Secondary | ICD-10-CM

## 2018-03-10 DIAGNOSIS — Z5111 Encounter for antineoplastic chemotherapy: Secondary | ICD-10-CM | POA: Insufficient documentation

## 2018-03-10 DIAGNOSIS — D649 Anemia, unspecified: Secondary | ICD-10-CM

## 2018-03-10 DIAGNOSIS — R109 Unspecified abdominal pain: Secondary | ICD-10-CM

## 2018-03-10 DIAGNOSIS — Z5112 Encounter for antineoplastic immunotherapy: Secondary | ICD-10-CM | POA: Diagnosis not present

## 2018-03-10 DIAGNOSIS — C3411 Malignant neoplasm of upper lobe, right bronchus or lung: Secondary | ICD-10-CM

## 2018-03-10 DIAGNOSIS — M1990 Unspecified osteoarthritis, unspecified site: Secondary | ICD-10-CM

## 2018-03-10 DIAGNOSIS — G893 Neoplasm related pain (acute) (chronic): Secondary | ICD-10-CM | POA: Diagnosis not present

## 2018-03-10 DIAGNOSIS — C787 Secondary malignant neoplasm of liver and intrahepatic bile duct: Secondary | ICD-10-CM | POA: Diagnosis not present

## 2018-03-10 LAB — CBC WITH DIFFERENTIAL/PLATELET
BASOS ABS: 0 10*3/uL (ref 0–0.1)
Basophils Relative: 1 %
EOS PCT: 1 %
Eosinophils Absolute: 0 10*3/uL (ref 0–0.7)
HEMATOCRIT: 32.7 % — AB (ref 35.0–47.0)
Hemoglobin: 10.9 g/dL — ABNORMAL LOW (ref 12.0–16.0)
Lymphocytes Relative: 23 %
Lymphs Abs: 0.7 10*3/uL — ABNORMAL LOW (ref 1.0–3.6)
MCH: 30.9 pg (ref 26.0–34.0)
MCHC: 33.2 g/dL (ref 32.0–36.0)
MCV: 93.3 fL (ref 80.0–100.0)
MONO ABS: 0.3 10*3/uL (ref 0.2–0.9)
Monocytes Relative: 9 %
NEUTROS ABS: 2.2 10*3/uL (ref 1.4–6.5)
Neutrophils Relative %: 66 %
PLATELETS: 266 10*3/uL (ref 150–440)
RBC: 3.51 MIL/uL — ABNORMAL LOW (ref 3.80–5.20)
RDW: 14.4 % (ref 11.5–14.5)
WBC: 3.3 10*3/uL — ABNORMAL LOW (ref 3.6–11.0)

## 2018-03-10 LAB — COMPREHENSIVE METABOLIC PANEL
ALBUMIN: 3.9 g/dL (ref 3.5–5.0)
ALK PHOS: 63 U/L (ref 38–126)
ALT: 12 U/L (ref 0–44)
ANION GAP: 9 (ref 5–15)
AST: 18 U/L (ref 15–41)
BILIRUBIN TOTAL: 0.4 mg/dL (ref 0.3–1.2)
BUN: 12 mg/dL (ref 8–23)
CALCIUM: 9.2 mg/dL (ref 8.9–10.3)
CO2: 24 mmol/L (ref 22–32)
CREATININE: 0.78 mg/dL (ref 0.44–1.00)
Chloride: 105 mmol/L (ref 98–111)
GFR calc Af Amer: >60 mL/min (ref >60)
GFR calc non Af Amer: >60 mL/min (ref >60)
GLUCOSE: 99 mg/dL (ref 70–99)
Potassium: 3.7 mmol/L (ref 3.5–5.1)
Sodium: 138 mmol/L (ref 135–145)
TOTAL PROTEIN: 7.1 g/dL (ref 6.5–8.1)

## 2018-03-10 MED ORDER — SODIUM CHLORIDE 0.9 % IV SOLN
Freq: Once | INTRAVENOUS | Status: AC
  Administered 2018-03-10: 15:00:00 via INTRAVENOUS
  Filled 2018-03-10: qty 1000

## 2018-03-10 MED ORDER — OXYCODONE-ACETAMINOPHEN 5-325 MG PO TABS: 1.0000 | ORAL_TABLET | Freq: Four times a day (QID) | ORAL | 0 refills | Status: DC | PRN

## 2018-03-10 MED ORDER — HEPARIN SOD (PORK) LOCK FLUSH 100 UNIT/ML IV SOLN
500.0000 [IU] | Freq: Once | INTRAVENOUS | Status: AC | PRN
  Administered 2018-03-10: 500 [IU]

## 2018-03-10 MED ORDER — SODIUM CHLORIDE 0.9 % IV SOLN
620.0000 mg | Freq: Once | INTRAVENOUS | Status: AC
  Administered 2018-03-10: 620 mg via INTRAVENOUS
  Filled 2018-03-10: qty 10

## 2018-03-10 NOTE — Progress Notes (Signed)
Pt in for follow up, continues to complain "pain under my right breast". Pt also reports "a catch in my left hip".  Pt had CT scan on Friday.

## 2018-03-11 LAB — THYROID PANEL WITH TSH
Free Thyroxine Index: 2.2 (ref 1.2–4.9)
T3 UPTAKE RATIO: 25 % (ref 24–39)
T4, Total: 8.6 ug/dL (ref 4.5–12.0)
TSH: 3.98 u[IU]/mL (ref 0.450–4.500)

## 2018-03-19 ENCOUNTER — Telehealth: Payer: Self-pay | Admitting: *Deleted

## 2018-03-19 NOTE — Telephone Encounter (Signed)
Per Margreta Journey 03/19/18 staff message to Cancel the scheduled 03/20/18 PET scan. PET scan was cancelled as requested.  I called patient x2 and left a detailed message on patient's vmail making her aware that the PET scan scheduled for 03/20/18 @ 8:30 has been cancelled and still pending as of right now.

## 2018-03-19 NOTE — Telephone Encounter (Signed)
Suzan Garibaldi called to request more information for authorization of PET scan for this patient. [phone]

## 2018-03-19 NOTE — Telephone Encounter (Signed)
Call returned, PET can approved. Authorization # [id number], valid 8/14-11/12. PET scan and follow up can be rescheduled at this time.

## 2018-03-20 ENCOUNTER — Ambulatory Visit: Payer: Managed Care, Other (non HMO)

## 2018-03-20 ENCOUNTER — Encounter
Admission: RE | Admit: 2018-03-20 | Discharge: 2018-03-20 | Disposition: A | Discharge status: Home / Self Care | Payer: Managed Care, Other (non HMO) | Source: Ambulatory Visit | Attending: Oncology | Admitting: Oncology

## 2018-03-20 DIAGNOSIS — C3411 Malignant neoplasm of upper lobe, right bronchus or lung: Secondary | ICD-10-CM

## 2018-03-20 LAB — GLUCOSE, CAPILLARY: GLUCOSE-CAPILLARY: 85 mg/dL (ref 70–99)

## 2018-03-20 MED ORDER — FLUDEOXYGLUCOSE F - 18 (FDG) INJECTION
7.4400 | Freq: Once | INTRAVENOUS | Status: AC | PRN
  Administered 2018-03-20: 7.44 via INTRAVENOUS

## 2018-03-23 ENCOUNTER — Other Ambulatory Visit: Payer: Self-pay | Admitting: Oncology

## 2018-03-23 DIAGNOSIS — C3411 Malignant neoplasm of upper lobe, right bronchus or lung: Secondary | ICD-10-CM

## 2018-03-23 NOTE — Progress Notes (Signed)
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    INTERVAL HISTORY: Patient returns to clinic today for further evaluation, discussion of her PET scan results, and treatment planning.  She continues to have right flank pain, but otherwise feels well.  She continues to remain active and works full-time. She has no neurologic complaints.  She denies any recent fevers or illnesses. She denies any chest pain, shortness of breath, hemoptysis, or cough.  She has no nausea, vomiting, constipation, or diarrhea.  She has no melena or hematochezia.  She has no urinary complaints.  Patient offers no further specific complaints today.  REVIEW OF SYSTEMS:   Review of Systems  Constitutional: Negative.  Negative for fever, malaise/fatigue and weight loss.  Respiratory: Negative.  Negative for cough and shortness of breath.   Cardiovascular: Negative.  Negative for chest pain and leg swelling.  Gastrointestinal: Negative.  Negative for abdominal pain, blood in stool and melena.  Genitourinary: Positive for flank pain. Negative for dysuria.  Musculoskeletal: Negative for back pain.  Skin: Negative.  Negative for rash.  Neurological: Negative.  Negative for sensory change, focal weakness and weakness.  Psychiatric/Behavioral: Negative.  The patient is not nervous/anxious.     As per HPI. Otherwise, a complete review of systems is negative.  PAST MEDICAL HISTORY: Past Medical History:  Diagnosis Date  . Arthritis    right shoulder  . Cancer of right lung ([place]) 08/2017   Chemo + rad tx's.   Marland Kitchen Hyperlipidemia   . Hypertension   . Hypothyroidism   . Menopausal state   . Thyroid  disease     PAST SURGICAL HISTORY: Past Surgical History:  Procedure Laterality Date  . ABDOMINAL HYSTERECTOMY  2005  . BREAST EXCISIONAL BIOPSY Right 1979  . COLONOSCOPY WITH PROPOFOL N/A 05/13/2017   Procedure: COLONOSCOPY WITH PROPOFOL;  Surgeon: [name], MD;  Location: [facility] ENDOSCOPY;  Service: Gastroenterology;  Laterality: N/A;  . CYSTECTOMY Right    breast  . CYSTECTOMY  02/2015   back of neck  . ESOPHAGOGASTRODUODENOSCOPY (EGD) WITH PROPOFOL N/A 05/13/2017   Procedure: ESOPHAGOGASTRODUODENOSCOPY (EGD) WITH PROPOFOL;  Surgeon: [name], MD;  Location: [facility] ENDOSCOPY;  Service: Gastroenterology;  Laterality: N/A;  . PORTA CATH INSERTION N/A 09/01/2017   Procedure: PORTA CATH INSERTION;  Surgeon: Algernon Huxley, MD;  Location: [facility] LAB;  Service: Cardiovascular;  Laterality: N/A;  . TUBAL LIGATION  1986    FAMILY HISTORY: Family History  Problem Relation Age of Onset  . Hypertension Mother   . Stroke Mother   . Leukemia Mother   . Stroke Father   . Pneumonia Father   . Diabetes Sister   . Hyperlipidemia Sister   . Breast cancer Maternal Aunt 105    ADVANCED DIRECTIVES (Y/N):  N  HEALTH MAINTENANCE: Social History   Tobacco Use  . Smoking status: Former Smoker    Packs/day: 0.25    Types: Cigarettes    Last attempt to quit: 02/01/2017    Years since quitting: 1.1  . Smokeless tobacco: Never Used  Substance Use Topics  . Alcohol use: No    Alcohol/week: 0.0 standard drinks  . Drug use: No     Colonoscopy:  PAP:  Bone density:  Lipid panel:  No Known Allergies  Current Outpatient Medications  Medication Sig Dispense Refill  . amLODipine (NORVASC) 5 MG tablet TAKE 1 TABLET DAILY 90 tablet 1  . atorvastatin (LIPITOR) 10 MG tablet TAKE 1 TABLET DAILY AT 6 P.M. 90 tablet 1  . levothyroxine (SYNTHROID, LEVOTHROID) 50 MCG tablet TAKE 1 TABLET DAILY 90 tablet 3  . oxyCODONE-acetaminophen (PERCOCET/ROXICET) 5-325 MG tablet Take 1 tablet by mouth  every 6 (six) hours as needed for severe pain. 30 tablet 0  . folic acid (FOLVITE) 1 MG tablet Take 1 tablet (1 mg total) by mouth daily. 90 tablet 2   No current facility-administered medications for this visit.     OBJECTIVE: Vitals:   03/24/18 1349 03/24/18 1354  BP:  127/70  Pulse:  81  Resp: 16   Temp:  97.8 F (36.6 C)     Body mass index is 25.02 kg/m.    ECOG FS:0 - Asymptomatic  General: Well-developed, well-nourished, no acute distress. Eyes: Pink conjunctiva, anicteric sclera. HEENT: Normocephalic, moist mucous membranes, clear oropharnyx. Lungs: Clear to auscultation bilaterally. Heart: Regular rate and rhythm. No rubs, murmurs, or gallops. Abdomen: Soft, nontender, nondistended. No organomegaly noted, normoactive bowel sounds. Musculoskeletal: No edema, cyanosis, or clubbing. Neuro: Alert, answering all questions appropriately. Cranial nerves grossly intact. Skin: No rashes or petechiae noted. Psych: Normal affect. Lymphatics: No cervical, calvicular, axillary or inguinal LAD.  LAB RESULTS:  Lab Results  Component Value Date   NA 138 03/24/2018   K 3.9 03/24/2018   CL 104 03/24/2018   CO2 26 03/24/2018   GLUCOSE 104 (H) 03/24/2018   BUN 14 03/24/2018   CREATININE 0.67 03/24/2018   CALCIUM 9.4 03/24/2018   PROT 7.4 03/24/2018   ALBUMIN 4.3 03/24/2018   AST 20 03/24/2018   ALT 13 03/24/2018   ALKPHOS 71 03/24/2018   BILITOT 0.5 03/24/2018   GFRNONAA >60 03/24/2018   GFRAA >60 03/24/2018    Lab Results  Component Value Date   WBC 3.9 03/24/2018   NEUTROABS 2.8 03/24/2018   HGB 11.2 (L) 03/24/2018   HCT 33.5 (L) 03/24/2018   MCV 92.6 03/24/2018   PLT 281 03/24/2018     STUDIES: Ct Chest W Contrast  Result Date: 03/06/2018 CLINICAL DATA:  Restaging right lung cancer EXAM: CT CHEST WITH CONTRAST TECHNIQUE: Multidetector CT imaging of the chest was performed during intravenous contrast administration. CONTRAST:  34mL OMNIPAQUE IOHEXOL 300 MG/ML   SOLN COMPARISON:  10/31/2017 FINDINGS: Cardiovascular: Heart is normal in size.  No pericardial effusion. No evidence of thoracic aortic aneurysm. Atherosclerotic calcifications of the aortic arch. Mild coronary atherosclerosis the LAD. Left chest port terminates cavoatrial junction. Mediastinum/Nodes: No suspicious mediastinal, hilar, or axillary lymphadenopathy. 9 mm left thyroid nodule (series 2/image 18). Lungs/Pleura: Radiation changes in the medial right upper lobe, left paramediastinal region, and superior segment right lower lobe. 10 mm right lower lobe nodule (series 3/image 81), previously 5 mm, compatible with metastasis. Additional small pulmonary nodules measuring up to 5 mm in the right middle lobe and bilateral lower lobes (series 3/images 53, 56, 59, 76, and 96), mildly progressive. No focal consolidation. No pleural effusion or pneumothorax. Upper Abdomen: At least two hepatic metastases, measuring 2.6 cm in segment 7 (series 2/image 103) and 1.7 cm in segment 4B (series 2/image 116), new. Musculoskeletal: Degenerative changes of the visualized thoracolumbar spine. IMPRESSION: Radiation changes, as described above. Bilateral pulmonary nodules/metastases, measuring up to 10 mm in the right lower lobe, mildly progressive.  At least two hepatic metastases, measuring up to 2.6 cm in segment 7, new. Aortic Atherosclerosis (ICD10-I70.0). Electronically Signed   By: Julian Hy M.D.   On: 03/06/2018 17:13   Nm Pet Image Restag (ps) Skull Base To Thigh  Result Date: 03/20/2018 CLINICAL DATA:  Subsequent treatment strategy for right lung cancer. EXAM: NUCLEAR MEDICINE PET SKULL BASE TO THIGH TECHNIQUE: 7.4 mCi F-18 FDG was injected intravenously. Full-ring PET imaging was performed from the skull base to thigh after the radiotracer. CT data was obtained and used for attenuation correction and anatomic localization. Fasting blood glucose: 85 mg/dl COMPARISON:  Multiple exams, including 08/08/2017,  and CT chest from 03/06/2018 FINDINGS: Mediastinal blood pool activity: SUV max 2.0 NECK: Activity in the right scalene musculature without CT abnormality, likely physiologic/incidental. Incidental CT findings: Bilateral carotid atherosclerotic calcification. CHEST: Radiation pneumonitis in the right upper lobe with prominent reduction in the prior masslike lesion which is currently difficult to separate from the radiation pneumonitis. Maximum SUV in this vicinity 3.3, formerly 12.5. The prior right supraclavicular lymph node has resolved. Radiation pneumonitis in the space superior segment right lower lobe, with low-grade activity. 0.9 cm right lower lobe nodule on image 100/3 with maximum SUV 1.5. Additional bilateral smaller nodules are not appreciably hypermetabolic but are below sensitive PET-CT size thresholds; these nodules were documented as being progressive in size on the more recent chest CT. The previous hypermetabolic adenopathy in the chest has resolved. Incidental CT findings: Right Port-A-Cath tip: Right atrium. Atherosclerotic calcification of the thoracic aorta and branch vessels. ABDOMEN/PELVIS: A hypermetabolic mass in segment 7 of the liver measures approximately 2.3 by 2.3 cm and has a maximum SUV of 11.8. A hypermetabolic mildly hypodense mass primarily in segment 4 of the liver measures 2.1 by 1.4 cm with maximum SUV 11.8. A 1.4 by 0.9 cm mass of the left adrenal gland is new compared to prior PET-CT and has a maximum SUV of 7.3 compatible with metastatic lesion. A small hypermetabolic soft tissue mass posterior to the right psoas muscle and along the upper border of the iliacus measures 0.9 by 0.5 cm on image 180/3 and has a maximum SUV of 4.0, compatible with a small metastatic focus. Incidental CT findings: Aortoiliac atherosclerotic vascular disease. SKELETON: Compared to the prior PET-CT there are new lytic metastatic lesions of the left acromion, right seventh rib, and left iliac bone  compatible with osseous metastatic disease. The acromial lesion is only mildly lucent but has a maximum SUV of 6.9. The right rib lesion is notably lytic and expansile with a maximum SUV of 9.6. There are 2 lytic lesions in the left iliac bone, an anterior lesion with maximum SUV of 9.8 and a posterior lesion with maximum SUV 15.6. Both lesions demonstrate cortical destruction and could have a small soft tissue component. Incidental CT findings: None IMPRESSION: 1. Although the original primary lesion is markedly reduced in activity following radiation therapy, and the hypermetabolic thoracic adenopathy has resolved, there are hypermetabolic metastatic lesions in the skeleton, liver, left adrenal gland, right retroperitoneum, and probably to the lungs compatible with progressive malignancy. 2.  Aortic Atherosclerosis (ICD10-I70.0). Electronically Signed   By: Van Clines M.D.   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     PLAN:    1. 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  OmniSeq testing did not reveal any actionable mutations.  Patient wishes to continue with palliative treatment.  Return to clinic in 1 week to initiate cycle 1 of carboplatinum, pemetrexed, and Avastin.  Patient will receive treatment every 3 weeks.  She will also receive Zometa on the odd-numbered cycles for her bony disease.  Plan to reimage at the conclusion of cycle 4.     2.  Leukopenia: Resolved. 3.  Anemia: Hemoglobin improved to 11.2. 4.  Right flank/rib pain: Possibly related to liver metastasis.  PET scan as  above.  Continue Percocet as prescribed.  I spent a total of 30 minutes face-to-face with the patient of which greater than 50% of the visit was spent in counseling and coordination of care as detailed above.    Patient expressed understanding and was in agreement with this plan. She also understands that She can call clinic at any time with any questions, concerns, or complaints.   Cancer Staging Cancer of upper lobe of right lung [facility]) Staging form: Lung, AJCC 8th Edition - Clinical stage from 08/24/2017: Stage IIIB (cT2a, cN3, cM0) - Signed by Lloyd Huger, MD on 08/24/2017   Lloyd Huger, MD   03/24/2018 3:53 PM

## 2018-03-24 ENCOUNTER — Other Ambulatory Visit: Payer: Self-pay

## 2018-03-24 ENCOUNTER — Encounter: Payer: Self-pay | Admitting: Oncology

## 2018-03-24 ENCOUNTER — Inpatient Hospital Stay: Payer: Managed Care, Other (non HMO)

## 2018-03-24 ENCOUNTER — Inpatient Hospital Stay (HOSPITAL_BASED_OUTPATIENT_CLINIC_OR_DEPARTMENT_OTHER): Payer: Managed Care, Other (non HMO) | Admitting: Oncology

## 2018-03-24 VITALS — BP 127/70 | HR 81 | Temp 97.8°F | Resp 16 | Ht 61.0 in | Wt 132.4 lb

## 2018-03-24 DIAGNOSIS — C7951 Secondary malignant neoplasm of bone: Secondary | ICD-10-CM

## 2018-03-24 DIAGNOSIS — C3411 Malignant neoplasm of upper lobe, right bronchus or lung: Secondary | ICD-10-CM

## 2018-03-24 DIAGNOSIS — I10 Essential (primary) hypertension: Secondary | ICD-10-CM

## 2018-03-24 DIAGNOSIS — Z87891 Personal history of nicotine dependence: Secondary | ICD-10-CM

## 2018-03-24 DIAGNOSIS — C787 Secondary malignant neoplasm of liver and intrahepatic bile duct: Secondary | ICD-10-CM | POA: Diagnosis not present

## 2018-03-24 DIAGNOSIS — M1990 Unspecified osteoarthritis, unspecified site: Secondary | ICD-10-CM

## 2018-03-24 DIAGNOSIS — E785 Hyperlipidemia, unspecified: Secondary | ICD-10-CM

## 2018-03-24 DIAGNOSIS — G893 Neoplasm related pain (acute) (chronic): Secondary | ICD-10-CM

## 2018-03-24 DIAGNOSIS — E039 Hypothyroidism, unspecified: Secondary | ICD-10-CM

## 2018-03-24 DIAGNOSIS — D72819 Decreased white blood cell count, unspecified: Secondary | ICD-10-CM

## 2018-03-24 DIAGNOSIS — Z803 Family history of malignant neoplasm of breast: Secondary | ICD-10-CM

## 2018-03-24 DIAGNOSIS — D649 Anemia, unspecified: Secondary | ICD-10-CM

## 2018-03-24 DIAGNOSIS — R109 Unspecified abdominal pain: Secondary | ICD-10-CM

## 2018-03-24 DIAGNOSIS — Z79899 Other long term (current) drug therapy: Secondary | ICD-10-CM

## 2018-03-24 LAB — COMPREHENSIVE METABOLIC PANEL
ALT: 13 U/L (ref 0–44)
AST: 20 U/L (ref 15–41)
Albumin: 4.3 g/dL (ref 3.5–5.0)
Alkaline Phosphatase: 71 U/L (ref 38–126)
Anion gap: 8 (ref 5–15)
BUN: 14 mg/dL (ref 8–23)
CHLORIDE: 104 mmol/L (ref 98–111)
CO2: 26 mmol/L (ref 22–32)
CREATININE: 0.67 mg/dL (ref 0.44–1.00)
Calcium: 9.4 mg/dL (ref 8.9–10.3)
GFR calc Af Amer: >60 mL/min (ref >60)
GFR calc non Af Amer: >60 mL/min (ref >60)
GLUCOSE: 104 mg/dL — AB (ref 70–99)
Potassium: 3.9 mmol/L (ref 3.5–5.1)
SODIUM: 138 mmol/L (ref 135–145)
Total Bilirubin: 0.5 mg/dL (ref 0.3–1.2)
Total Protein: 7.4 g/dL (ref 6.5–8.1)

## 2018-03-24 LAB — CBC WITH DIFFERENTIAL/PLATELET
Basophils Absolute: 0.1 10*3/uL (ref 0–0.1)
Basophils Relative: 1 %
EOS ABS: 0 10*3/uL (ref 0–0.7)
EOS PCT: 1 %
HCT: 33.5 % — ABNORMAL LOW (ref 35.0–47.0)
HEMOGLOBIN: 11.2 g/dL — AB (ref 12.0–16.0)
Lymphocytes Relative: 21 %
Lymphs Abs: 0.8 10*3/uL — ABNORMAL LOW (ref 1.0–3.6)
MCH: 31 pg (ref 26.0–34.0)
MCHC: 33.5 g/dL (ref 32.0–36.0)
MCV: 92.6 fL (ref 80.0–100.0)
MONOS PCT: 7 %
Monocytes Absolute: 0.3 10*3/uL (ref 0.2–0.9)
Neutro Abs: 2.8 10*3/uL (ref 1.4–6.5)
Neutrophils Relative %: 70 %
PLATELETS: 281 10*3/uL (ref 150–440)
RBC: 3.62 MIL/uL — ABNORMAL LOW (ref 3.80–5.20)
RDW: 13.9 % (ref 11.5–14.5)
WBC: 3.9 10*3/uL (ref 3.6–11.0)

## 2018-03-24 MED ORDER — FOLIC ACID 1 MG PO TABS: 1.0000 mg | ORAL_TABLET | Freq: Every day | ORAL | 2 refills | Status: DC

## 2018-03-24 NOTE — Progress Notes (Signed)
Patient here for follow up she is still having pain on her right side near her breast and occasional pain in her left hip.

## 2018-03-24 NOTE — Progress Notes (Signed)
DISCONTINUE ON PATHWAY REGIMEN - Non-Small Cell Lung     Administer weekly:     Paclitaxel      Carboplatin   **Always confirm dose/schedule in your pharmacy ordering system**  REASON: Disease Progression PRIOR TREATMENT: [id number]: Carboplatin AUC=2 + Paclitaxel 45 mg/m2 Weekly During Radiation TREATMENT RESPONSE: Progressive Disease (PD)  START ON PATHWAY REGIMEN - Non-Small Cell Lung     A cycle is every 21 days:     Carboplatin      Pemetrexed      Bevacizumab-xxxx   **Always confirm dose/schedule in your pharmacy ordering system**  Patient Characteristics: Stage IV Metastatic, Nonsquamous, Initial Chemotherapy/Immunotherapy, PS = 0, 1, ALK Translocation Negative/Unknown and EGFR Mutation Negative/Non-Sensitizing/Unknown, Not a Candidate for Immunotherapy AJCC T Category: T2a Current Disease Status: Distant Metastases AJCC N Category: N3 AJCC M Category: M1c AJCC 8 Stage Grouping: IVB Histology: Nonsquamous Cell ROS1 Rearrangement Status: Negative T790M Mutation Status: Not Applicable - EGFR Mutation Negative/Unknown Other Mutations/Biomarkers: No Other Actionable Mutations NTRK Gene Fusion Status: Negative PD-L1 Expression Status: PD-L1 Negative Chemotherapy/Immunotherapy LOT: Initial Chemotherapy/Immunotherapy Molecular Targeted Therapy: Not Appropriate ALK Translocation Status: Negative EGFR Mutation Status: Quantity Not Sufficient BRAF V600E Mutation Status: Negative Performance Status: PS = 0, 1 Immunotherapy Candidate Status: Not a Candidate for Immunotherapy Intent of Therapy: Non-Curative / Palliative Intent, Discussed with Patient

## 2018-03-25 LAB — THYROID PANEL WITH TSH
Free Thyroxine Index: 2.1 (ref 1.2–4.9)
T3 UPTAKE RATIO: 24 % (ref 24–39)
T4 TOTAL: 8.8 ug/dL (ref 4.5–12.0)
TSH: 4.2 u[IU]/mL (ref 0.450–4.500)

## 2018-03-26 ENCOUNTER — Other Ambulatory Visit: Payer: Self-pay | Admitting: Oncology

## 2018-03-26 ENCOUNTER — Telehealth: Payer: Self-pay | Admitting: *Deleted

## 2018-03-26 MED ORDER — OXYCODONE HCL 10 MG PO TABS: 10.0000 mg | ORAL_TABLET | Freq: Four times a day (QID) | ORAL | 0 refills | Status: DC | PRN

## 2018-03-26 NOTE — Telephone Encounter (Signed)
Oxycodone 10mg  q6hr escribed.

## 2018-03-26 NOTE — Telephone Encounter (Signed)
Returned call to patient left message on voice mail that new prescription was sent to pharmacy and that she needs to take folic acid duew to the chemotherapy she is getting

## 2018-03-26 NOTE — Telephone Encounter (Signed)
Patient called and asks if she is to contrinue taking the Boric acid because it says not to take with other medications without checking with physician first. Please advise. She also reports that she is having pain and that she needs a refill of her Oxycodone, even needing something stronger. Please advise

## 2018-03-27 ENCOUNTER — Other Ambulatory Visit: Payer: Self-pay | Admitting: Unknown Physician Specialty

## 2018-03-29 NOTE — Progress Notes (Signed)
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    INTERVAL HISTORY: Patient returns to clinic today for further evaluation and initiation of cycle 1 of carboplatinum, pemetrexed, and Avastin.  She continues to have increased pain, particularly in her left hip and shoulder.  She otherwise feels well. She has no neurologic complaints.  She denies any recent fevers or illnesses. She denies any chest pain, shortness of breath, hemoptysis, or cough.  She has no nausea, vomiting, constipation, or diarrhea.  She has no melena or hematochezia.  She has no urinary complaints.  Patient offers no further specific complaints today.  REVIEW OF SYSTEMS:   Review of Systems  Constitutional: Negative.  Negative for fever, malaise/fatigue and weight loss.  Respiratory: Negative.  Negative for cough and shortness of breath.   Cardiovascular: Negative.  Negative for chest pain and leg swelling.  Gastrointestinal: Negative.  Negative for abdominal pain, blood in stool and melena.  Genitourinary: Positive for flank pain. Negative for dysuria.  Musculoskeletal: Positive for joint pain. Negative for back pain.  Skin: Negative.  Negative for rash.  Neurological: Negative.  Negative for sensory change, focal weakness, weakness and headaches.  Psychiatric/Behavioral: Negative.  The patient is not nervous/anxious.     As per HPI. Otherwise, a complete review of systems is negative.  PAST MEDICAL HISTORY: Past Medical History:  Diagnosis Date  . Arthritis    right shoulder  . Cancer of right lung ([place]) 08/2017   Chemo + rad tx's.   Marland Kitchen Hyperlipidemia   . Hypertension   . Hypothyroidism     . Menopausal state   . Thyroid disease     PAST SURGICAL HISTORY: Past Surgical History:  Procedure Laterality Date  . ABDOMINAL HYSTERECTOMY  2005  . BREAST EXCISIONAL BIOPSY Right 1979  . COLONOSCOPY WITH PROPOFOL N/A 05/13/2017   Procedure: COLONOSCOPY WITH PROPOFOL;  Surgeon: [name], MD;  Location: [facility] ENDOSCOPY;  Service: Gastroenterology;  Laterality: N/A;  . CYSTECTOMY Right    breast  . CYSTECTOMY  02/2015   back of neck  . ESOPHAGOGASTRODUODENOSCOPY (EGD) WITH PROPOFOL N/A 05/13/2017   Procedure: ESOPHAGOGASTRODUODENOSCOPY (EGD) WITH PROPOFOL;  Surgeon: [name], MD;  Location: [facility] ENDOSCOPY;  Service: Gastroenterology;  Laterality: N/A;  . PORTA CATH INSERTION N/A 09/01/2017   Procedure: PORTA CATH INSERTION;  Surgeon: Algernon Huxley, MD;  Location: [place] CV LAB;  Service: Cardiovascular;  Laterality: N/A;  . TUBAL LIGATION  1986    FAMILY HISTORY: Family History  Problem Relation Age of Onset  . Hypertension Mother   . Stroke Mother   . Leukemia Mother   . Stroke Father   . Pneumonia Father   . Diabetes Sister   . Hyperlipidemia Sister   . Breast cancer Maternal Aunt 37    ADVANCED DIRECTIVES (Y/N):  N  HEALTH MAINTENANCE: Social History   Tobacco Use  . Smoking status: Former Smoker    Packs/day: 0.25    Types: Cigarettes    Last attempt to quit: 02/01/2017    Years since quitting: 1.1  . Smokeless tobacco: Never Used  Substance Use Topics  . Alcohol use: No    Alcohol/week: 0.0 standard drinks  . Drug use: No  Colonoscopy:  PAP:  Bone density:  Lipid panel:  No Known Allergies  Current Outpatient Medications  Medication Sig Dispense Refill  . amLODipine (NORVASC) 5 MG tablet TAKE 1 TABLET DAILY 90 tablet 1  . atorvastatin (LIPITOR) 10 MG tablet TAKE 1 TABLET DAILY AT 6 P.M. 90 tablet 1  . folic acid (FOLVITE) 1 MG tablet Take 1 tablet (1 mg total) by mouth daily. 90 tablet 2  . levothyroxine (SYNTHROID, LEVOTHROID) 50 MCG  tablet TAKE 1 TABLET DAILY 90 tablet 3  . Oxycodone HCl 10 MG TABS Take 1 tablet (10 mg total) by mouth every 6 (six) hours as needed. 120 tablet 0  . lidocaine-prilocaine (EMLA) cream APP EXT AA 1 TIME  3   No current facility-administered medications for this visit.     OBJECTIVE: Vitals:   04/01/18 0835  BP: 108/67  Pulse: 71  Resp: 18  Temp: (!) 96.3 F (35.7 C)  SpO2: 97%     Body mass index is 24.87 kg/m.    ECOG FS:0 - Asymptomatic  General: Well-developed, well-nourished, no acute distress. Eyes: Pink conjunctiva, anicteric sclera. HEENT: Normocephalic, moist mucous membranes, clear oropharnyx. Lungs: Clear to auscultation bilaterally. Heart: Regular rate and rhythm. No rubs, murmurs, or gallops. Abdomen: Soft, nontender, nondistended. No organomegaly noted, normoactive bowel sounds. Musculoskeletal: No edema, cyanosis, or clubbing. Neuro: Alert, answering all questions appropriately. Cranial nerves grossly intact. Skin: No rashes or petechiae noted. Psych: Normal affect. Lymphatics: No cervical, calvicular, axillary or inguinal LAD.  LAB RESULTS:  Lab Results  Component Value Date   NA 139 04/01/2018   K 4.0 04/01/2018   CL 103 04/01/2018   CO2 26 04/01/2018   GLUCOSE 117 (H) 04/01/2018   BUN 10 04/01/2018   CREATININE 0.65 04/01/2018   CALCIUM 9.3 04/01/2018   PROT 7.1 04/01/2018   ALBUMIN 4.0 04/01/2018   AST 26 04/01/2018   ALT 13 04/01/2018   ALKPHOS 69 04/01/2018   BILITOT 0.4 04/01/2018   GFRNONAA >60 04/01/2018   GFRAA >60 04/01/2018    Lab Results  Component Value Date   WBC 3.8 04/01/2018   NEUTROABS 2.7 04/01/2018   HGB 11.2 (L) 04/01/2018   HCT 33.0 (L) 04/01/2018   MCV 92.3 04/01/2018   PLT 252 04/01/2018     STUDIES: Ct Chest W Contrast  Result Date: 03/06/2018 CLINICAL DATA:  Restaging right lung cancer EXAM: CT CHEST WITH CONTRAST TECHNIQUE: Multidetector CT imaging of the chest was performed during intravenous contrast  administration. CONTRAST:  45mL OMNIPAQUE IOHEXOL 300 MG/ML  SOLN COMPARISON:  10/31/2017 FINDINGS: Cardiovascular: Heart is normal in size.  No pericardial effusion. No evidence of thoracic aortic aneurysm. Atherosclerotic calcifications of the aortic arch. Mild coronary atherosclerosis the LAD. Left chest port terminates cavoatrial junction. Mediastinum/Nodes: No suspicious mediastinal, hilar, or axillary lymphadenopathy. 9 mm left thyroid nodule (series 2/image 18). Lungs/Pleura: Radiation changes in the medial right upper lobe, left paramediastinal region, and superior segment right lower lobe. 10 mm right lower lobe nodule (series 3/image 81), previously 5 mm, compatible with metastasis. Additional small pulmonary nodules measuring up to 5 mm in the right middle lobe and bilateral lower lobes (series 3/images 53, 56, 59, 76, and 96), mildly progressive. No focal consolidation. No pleural effusion or pneumothorax. Upper Abdomen: At least two hepatic metastases, measuring 2.6 cm in segment 7 (series 2/image 103) and 1.7 cm in segment 4B (series 2/image 116), new. Musculoskeletal: Degenerative changes of the visualized thoracolumbar spine. IMPRESSION: Radiation changes, as described above. Bilateral  pulmonary nodules/metastases, measuring up to 10 mm in the right lower lobe, mildly progressive. At least two hepatic metastases, measuring up to 2.6 cm in segment 7, new. Aortic Atherosclerosis (ICD10-I70.0). Electronically Signed   By: Julian Hy M.D.   On: 03/06/2018 17:13   Nm Pet Image Restag (ps) Skull Base To Thigh  Result Date: 03/20/2018 CLINICAL DATA:  Subsequent treatment strategy for right lung cancer. EXAM: NUCLEAR MEDICINE PET SKULL BASE TO THIGH TECHNIQUE: 7.4 mCi F-18 FDG was injected intravenously. Full-ring PET imaging was performed from the skull base to thigh after the radiotracer. CT data was obtained and used for attenuation correction and anatomic localization. Fasting blood glucose:  85 mg/dl COMPARISON:  Multiple exams, including 08/08/2017, and CT chest from 03/06/2018 FINDINGS: Mediastinal blood pool activity: SUV max 2.0 NECK: Activity in the right scalene musculature without CT abnormality, likely physiologic/incidental. Incidental CT findings: Bilateral carotid atherosclerotic calcification. CHEST: Radiation pneumonitis in the right upper lobe with prominent reduction in the prior masslike lesion which is currently difficult to separate from the radiation pneumonitis. Maximum SUV in this vicinity 3.3, formerly 12.5. The prior right supraclavicular lymph node has resolved. Radiation pneumonitis in the space superior segment right lower lobe, with low-grade activity. 0.9 cm right lower lobe nodule on image 100/3 with maximum SUV 1.5. Additional bilateral smaller nodules are not appreciably hypermetabolic but are below sensitive PET-CT size thresholds; these nodules were documented as being progressive in size on the more recent chest CT. The previous hypermetabolic adenopathy in the chest has resolved. Incidental CT findings: Right Port-A-Cath tip: Right atrium. Atherosclerotic calcification of the thoracic aorta and branch vessels. ABDOMEN/PELVIS: A hypermetabolic mass in segment 7 of the liver measures approximately 2.3 by 2.3 cm and has a maximum SUV of 11.8. A hypermetabolic mildly hypodense mass primarily in segment 4 of the liver measures 2.1 by 1.4 cm with maximum SUV 11.8. A 1.4 by 0.9 cm mass of the left adrenal gland is new compared to prior PET-CT and has a maximum SUV of 7.3 compatible with metastatic lesion. A small hypermetabolic soft tissue mass posterior to the right psoas muscle and along the upper border of the iliacus measures 0.9 by 0.5 cm on image 180/3 and has a maximum SUV of 4.0, compatible with a small metastatic focus. Incidental CT findings: Aortoiliac atherosclerotic vascular disease. SKELETON: Compared to the prior PET-CT there are new lytic metastatic lesions of  the left acromion, right seventh rib, and left iliac bone compatible with osseous metastatic disease. The acromial lesion is only mildly lucent but has a maximum SUV of 6.9. The right rib lesion is notably lytic and expansile with a maximum SUV of 9.6. There are 2 lytic lesions in the left iliac bone, an anterior lesion with maximum SUV of 9.8 and a posterior lesion with maximum SUV 15.6. Both lesions demonstrate cortical destruction and could have a small soft tissue component. Incidental CT findings: None IMPRESSION: 1. Although the original primary lesion is markedly reduced in activity following radiation therapy, and the hypermetabolic thoracic adenopathy has resolved, there are hypermetabolic metastatic lesions in the skeleton, liver, left adrenal gland, right retroperitoneum, and probably to the lungs compatible with progressive malignancy. 2.  Aortic Atherosclerosis (ICD10-I70.0). Electronically Signed   By: Van Clines M.D.   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     PLAN:    1. Progressive stage IV adenocarcinoma of the lung with  metastatic disease in bones and liver: PET scan results from March 20, 2018 reviewed independently and reported as above with improvement of lung disease, but patient has new hypermetabolic lesions in her bones, liver, left adrenal gland, and right retroperitoneum consistent with progressive metastatic disease.  OmniSeq testing did not reveal any actionable mutations.  Patient wishes to continue with palliative treatment.  Proceed with cycle 1 of carboplatinum, pemetrexed, and Avastin today.  Patient will also receive Zometa on the odd-numbered cycles for her bony lesions.  Patient will require B12 injections along with her pemetrexed.  Return to clinic in 1 week for laboratory work and further evaluation and then in 3 weeks for further evaluation and consideration of cycle 2.   Plan to reimage after cycle 4.   2.  Leukopenia: Resolved. 3.  Anemia: Hemoglobin stable at 11.2.   4.  Pain: Possibly related to metastatic disease.  PET scan as above.  Continue Percocet as prescribed, consider radiation oncology referral in the future for symptomatic relief..  Patient expressed understanding and was in agreement with this plan. She also understands that She can call clinic at any time with any questions, concerns, or complaints.   Cancer Staging Cancer of upper lobe of right lung [facility]) Staging form: Lung, AJCC 8th Edition - Clinical stage from 08/24/2017: Stage IV (cT2a, cN3, cM1c) - Signed by Lloyd Huger, MD on 03/29/2018   Lloyd Huger, MD   04/04/2018 8:18 AM

## 2018-03-30 ENCOUNTER — Other Ambulatory Visit: Payer: Self-pay | Admitting: Unknown Physician Specialty

## 2018-03-30 NOTE — Telephone Encounter (Signed)
Patient called, left VM to return call to the office to discuss Amlodipine.

## 2018-03-30 NOTE — Telephone Encounter (Signed)
Pt states she is out of Synthroid but has enough Amlodipine until 04/02/18. Requesting enough medication until appt 04/08/18 be sent to: [facility] , [address]

## 2018-03-30 NOTE — Telephone Encounter (Signed)
Please find out if she has enough to get to her appointment with Apolonio Schneiders on 9/4

## 2018-03-30 NOTE — Telephone Encounter (Signed)
Patient called, left VM to return call to discuss Amlodipine.

## 2018-03-30 NOTE — Telephone Encounter (Signed)
Called patient back. She did not answer so I left a VM asking for her to please return my call. OK for PEC to ask patient if she has enough amlodipine to get to her appointment on 04/08/18 or if she will need the medication before then.

## 2018-03-30 NOTE — Telephone Encounter (Signed)
Called and left patient a VM asking for her to please return my call to discuss medication.

## 2018-03-31 MED ORDER — LEVOTHYROXINE SODIUM 50 MCG PO TABS: ORAL_TABLET | ORAL | 0 refills | Status: DC

## 2018-04-01 ENCOUNTER — Other Ambulatory Visit: Payer: Self-pay

## 2018-04-01 ENCOUNTER — Encounter: Payer: Self-pay | Admitting: Oncology

## 2018-04-01 ENCOUNTER — Inpatient Hospital Stay: Payer: Managed Care, Other (non HMO)

## 2018-04-01 ENCOUNTER — Inpatient Hospital Stay (HOSPITAL_BASED_OUTPATIENT_CLINIC_OR_DEPARTMENT_OTHER): Payer: Managed Care, Other (non HMO) | Admitting: Oncology

## 2018-04-01 VITALS — BP 108/67 | HR 71 | Temp 96.3°F | Resp 18 | Wt 131.6 lb

## 2018-04-01 VITALS — BP 117/72 | HR 73

## 2018-04-01 DIAGNOSIS — C787 Secondary malignant neoplasm of liver and intrahepatic bile duct: Secondary | ICD-10-CM | POA: Diagnosis not present

## 2018-04-01 DIAGNOSIS — C7951 Secondary malignant neoplasm of bone: Secondary | ICD-10-CM

## 2018-04-01 DIAGNOSIS — Z87891 Personal history of nicotine dependence: Secondary | ICD-10-CM

## 2018-04-01 DIAGNOSIS — E039 Hypothyroidism, unspecified: Secondary | ICD-10-CM

## 2018-04-01 DIAGNOSIS — D649 Anemia, unspecified: Secondary | ICD-10-CM

## 2018-04-01 DIAGNOSIS — C3411 Malignant neoplasm of upper lobe, right bronchus or lung: Secondary | ICD-10-CM

## 2018-04-01 DIAGNOSIS — I10 Essential (primary) hypertension: Secondary | ICD-10-CM

## 2018-04-01 DIAGNOSIS — E785 Hyperlipidemia, unspecified: Secondary | ICD-10-CM

## 2018-04-01 DIAGNOSIS — Z803 Family history of malignant neoplasm of breast: Secondary | ICD-10-CM

## 2018-04-01 DIAGNOSIS — Z79899 Other long term (current) drug therapy: Secondary | ICD-10-CM

## 2018-04-01 DIAGNOSIS — M1990 Unspecified osteoarthritis, unspecified site: Secondary | ICD-10-CM

## 2018-04-01 DIAGNOSIS — G893 Neoplasm related pain (acute) (chronic): Secondary | ICD-10-CM

## 2018-04-01 LAB — COMPREHENSIVE METABOLIC PANEL
ALBUMIN: 4 g/dL (ref 3.5–5.0)
ALT: 13 U/L (ref 0–44)
ANION GAP: 10 (ref 5–15)
AST: 26 U/L (ref 15–41)
Alkaline Phosphatase: 69 U/L (ref 38–126)
BUN: 10 mg/dL (ref 8–23)
CALCIUM: 9.3 mg/dL (ref 8.9–10.3)
CHLORIDE: 103 mmol/L (ref 98–111)
CO2: 26 mmol/L (ref 22–32)
Creatinine, Ser: 0.65 mg/dL (ref 0.44–1.00)
GFR calc non Af Amer: >60 mL/min (ref >60)
GLUCOSE: 117 mg/dL — AB (ref 70–99)
POTASSIUM: 4 mmol/L (ref 3.5–5.1)
SODIUM: 139 mmol/L (ref 135–145)
Total Bilirubin: 0.4 mg/dL (ref 0.3–1.2)
Total Protein: 7.1 g/dL (ref 6.5–8.1)

## 2018-04-01 LAB — CBC WITH DIFFERENTIAL/PLATELET
BASOS PCT: 1 %
Basophils Absolute: 0 10*3/uL (ref 0–0.1)
Eosinophils Absolute: 0 10*3/uL (ref 0–0.7)
Eosinophils Relative: 1 %
HEMATOCRIT: 33 % — AB (ref 35.0–47.0)
HEMOGLOBIN: 11.2 g/dL — AB (ref 12.0–16.0)
LYMPHS ABS: 0.6 10*3/uL — AB (ref 1.0–3.6)
LYMPHS PCT: 17 %
MCH: 31.3 pg (ref 26.0–34.0)
MCHC: 33.9 g/dL (ref 32.0–36.0)
MCV: 92.3 fL (ref 80.0–100.0)
MONO ABS: 0.3 10*3/uL (ref 0.2–0.9)
MONOS PCT: 9 %
NEUTROS ABS: 2.7 10*3/uL (ref 1.4–6.5)
NEUTROS PCT: 72 %
Platelets: 252 10*3/uL (ref 150–440)
RBC: 3.58 MIL/uL — ABNORMAL LOW (ref 3.80–5.20)
RDW: 13.5 % (ref 11.5–14.5)
WBC: 3.8 10*3/uL (ref 3.6–11.0)

## 2018-04-01 LAB — PROTEIN, URINE, RANDOM: Total Protein, Urine: <6 mg/dL

## 2018-04-01 MED ORDER — CYANOCOBALAMIN 1000 MCG/ML IJ SOLN
1000.0000 ug | Freq: Once | INTRAMUSCULAR | Status: AC
  Administered 2018-04-01: 1000 ug via INTRAMUSCULAR
  Filled 2018-04-01: qty 1

## 2018-04-01 MED ORDER — SODIUM CHLORIDE 0.9% FLUSH
10.0000 mL | INTRAVENOUS | Status: DC | PRN
  Administered 2018-04-01: 10 mL via INTRAVENOUS
  Filled 2018-04-01: qty 10

## 2018-04-01 MED ORDER — SODIUM CHLORIDE 0.9 % IV SOLN
Freq: Once | INTRAVENOUS | Status: AC
  Administered 2018-04-01: 11:00:00 via INTRAVENOUS
  Filled 2018-04-01: qty 5

## 2018-04-01 MED ORDER — HEPARIN SOD (PORK) LOCK FLUSH 100 UNIT/ML IV SOLN
500.0000 [IU] | Freq: Once | INTRAVENOUS | Status: AC
  Administered 2018-04-01: 500 [IU] via INTRAVENOUS
  Filled 2018-04-01: qty 5

## 2018-04-01 MED ORDER — SODIUM CHLORIDE 0.9 % IV SOLN
480.5000 mg | Freq: Once | INTRAVENOUS | Status: AC
  Administered 2018-04-01: 480 mg via INTRAVENOUS
  Filled 2018-04-01: qty 48

## 2018-04-01 MED ORDER — PALONOSETRON HCL INJECTION 0.25 MG/5ML
0.2500 mg | Freq: Once | INTRAVENOUS | Status: AC
  Administered 2018-04-01: 0.25 mg via INTRAVENOUS
  Filled 2018-04-01: qty 5

## 2018-04-01 MED ORDER — SODIUM CHLORIDE 0.9 % IV SOLN
900.0000 mg | Freq: Once | INTRAVENOUS | Status: AC
  Administered 2018-04-01: 900 mg via INTRAVENOUS
  Filled 2018-04-01: qty 16

## 2018-04-01 MED ORDER — ZOLEDRONIC ACID 4 MG/100ML IV SOLN
4.0000 mg | Freq: Once | INTRAVENOUS | Status: AC
  Administered 2018-04-01: 4 mg via INTRAVENOUS
  Filled 2018-04-01: qty 100

## 2018-04-01 MED ORDER — SODIUM CHLORIDE 0.9 % IV SOLN
Freq: Once | INTRAVENOUS | Status: AC
  Administered 2018-04-01: 10:00:00 via INTRAVENOUS
  Filled 2018-04-01: qty 250

## 2018-04-01 MED ORDER — SODIUM CHLORIDE 0.9 % IV SOLN
800.0000 mg | Freq: Once | INTRAVENOUS | Status: AC
  Administered 2018-04-01: 800 mg via INTRAVENOUS
  Filled 2018-04-01: qty 20

## 2018-04-01 NOTE — Progress Notes (Signed)
Patient here for follow up. C/o pain left hip and shoulder, which are getting worse and she is starting to feel more stiff.

## 2018-04-02 LAB — THYROID PANEL WITH TSH
Free Thyroxine Index: 3 (ref 1.2–4.9)
T3 UPTAKE RATIO: 28 % (ref 24–39)
T4, Total: 10.7 ug/dL (ref 4.5–12.0)
TSH: 3.47 u[IU]/mL (ref 0.450–4.500)

## 2018-04-06 NOTE — Progress Notes (Signed)
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    INTERVAL HISTORY: Patient returns to clinic today for further evaluation and to assess her toleration of cycle 1 of carboplatinum, pemetrexed, and Avastin.  She tolerated treatment well without significant side effects.  She continues to have pain, but this is better controlled on her current narcotics. She has no neurologic complaints.  She denies any recent fevers or illnesses. She denies any chest pain, shortness of breath, hemoptysis, or cough.  She has no nausea, vomiting, constipation, or diarrhea.  She has no melena or hematochezia.  She has no urinary complaints.  Patient offers no further specific complaints today.  REVIEW OF SYSTEMS:   Review of Systems  Constitutional: Negative.  Negative for fever, malaise/fatigue and weight loss.  Respiratory: Negative.  Negative for cough and shortness of breath.   Cardiovascular: Negative.  Negative for chest pain and leg swelling.  Gastrointestinal: Negative.  Negative for abdominal pain, blood in stool and melena.  Genitourinary: Positive for flank pain. Negative for dysuria.  Musculoskeletal: Positive for joint pain. Negative for back pain.  Skin: Negative.  Negative for rash.  Neurological: Negative.  Negative for sensory change, focal weakness, weakness and headaches.  Psychiatric/Behavioral: Negative.  The patient is not nervous/anxious.     As per HPI. Otherwise, a complete review of systems is negative.  PAST MEDICAL HISTORY: Past Medical History:  Diagnosis Date  . Arthritis    right shoulder  . Cancer of right lung ([place]) 08/2017   Chemo + rad tx's.   Marland Kitchen  Hyperlipidemia   . Hypertension   . Hypothyroidism   . Menopausal state   . Thyroid disease     PAST SURGICAL HISTORY: Past Surgical History:  Procedure Laterality Date  . ABDOMINAL HYSTERECTOMY  2005  . BREAST EXCISIONAL BIOPSY Right 1979  . COLONOSCOPY WITH PROPOFOL N/A 05/13/2017   Procedure: COLONOSCOPY WITH PROPOFOL;  Surgeon: [name], MD;  Location: [facility] ENDOSCOPY;  Service: Gastroenterology;  Laterality: N/A;  . CYSTECTOMY Right    breast  . CYSTECTOMY  02/2015   back of neck  . ESOPHAGOGASTRODUODENOSCOPY (EGD) WITH PROPOFOL N/A 05/13/2017   Procedure: ESOPHAGOGASTRODUODENOSCOPY (EGD) WITH PROPOFOL;  Surgeon: [name], MD;  Location: [facility] ENDOSCOPY;  Service: Gastroenterology;  Laterality: N/A;  . PORTA CATH INSERTION N/A 09/01/2017   Procedure: PORTA CATH INSERTION;  Surgeon: Algernon Huxley, MD;  Location: [facility] LAB;  Service: Cardiovascular;  Laterality: N/A;  . TUBAL LIGATION  1986    FAMILY HISTORY: Family History  Problem Relation Age of Onset  . Hypertension Mother   . Stroke Mother   . Leukemia Mother   . Stroke Father   . Pneumonia Father   . Diabetes Sister   . Hyperlipidemia Sister   . Breast cancer Maternal Aunt 68    ADVANCED DIRECTIVES (Y/N):  N  HEALTH MAINTENANCE: Social History   Tobacco Use  . Smoking status: Former Smoker    Packs/day: 0.25    Types: Cigarettes    Last attempt to quit: 02/01/2017    Years since quitting: 1.1  . Smokeless tobacco: Never Used  Substance Use Topics  . Alcohol use: No    Alcohol/week: 0.0 standard drinks  .  Drug use: No     Colonoscopy:  PAP:  Bone density:  Lipid panel:  No Known Allergies  Current Outpatient Medications  Medication Sig Dispense Refill  . amLODipine (NORVASC) 5 MG tablet Take 1 tablet (5 mg total) by mouth daily. 90 tablet 1  . atorvastatin (LIPITOR) 10 MG tablet TAKE 1 TABLET DAILY AT 6 P.M. 90 tablet 1  . folic acid (FOLVITE) 1 MG tablet Take 1 tablet (1 mg total)  by mouth daily. 90 tablet 2  . levothyroxine (SYNTHROID, LEVOTHROID) 50 MCG tablet TAKE 1 TABLET DAILY 90 tablet 3  . lidocaine-prilocaine (EMLA) cream APP EXT AA 1 TIME  3  . Oxycodone HCl 10 MG TABS Take 1 tablet (10 mg total) by mouth every 6 (six) hours as needed. 120 tablet 0   No current facility-administered medications for this visit.     OBJECTIVE: Vitals:   04/09/18 1445  BP: 123/72  Pulse: 78  Resp: 18  Temp: (!) 95.8 F (35.4 C)     Body mass index is 24.76 kg/m.    ECOG FS:0 - Asymptomatic  General: Well-developed, well-nourished, no acute distress. Eyes: Pink conjunctiva, anicteric sclera. HEENT: Normocephalic, moist mucous membranes. Lungs: Clear to auscultation bilaterally. Heart: Regular rate and rhythm. No rubs, murmurs, or gallops. Abdomen: Soft, nontender, nondistended. No organomegaly noted, normoactive bowel sounds. Musculoskeletal: No edema, cyanosis, or clubbing. Neuro: Alert, answering all questions appropriately. Cranial nerves grossly intact. Skin: No rashes or petechiae noted. Psych: Normal affect.  LAB RESULTS:  Lab Results  Component Value Date   NA 138 04/09/2018   K 4.3 04/09/2018   CL 105 04/09/2018   CO2 27 04/09/2018   GLUCOSE 101 (H) 04/09/2018   BUN 10 04/09/2018   CREATININE 0.73 04/09/2018   CALCIUM 8.6 (L) 04/09/2018   PROT 7.9 04/09/2018   ALBUMIN 4.1 04/09/2018   AST 26 04/09/2018   ALT 20 04/09/2018   ALKPHOS 88 04/09/2018   BILITOT 0.3 04/09/2018   GFRNONAA >60 04/09/2018   GFRAA >60 04/09/2018    Lab Results  Component Value Date   WBC 1.2 (LL) 04/09/2018   NEUTROABS 0.6 (L) 04/09/2018   HGB 11.8 (L) 04/09/2018   HCT 35.1 04/09/2018   MCV 91.4 04/09/2018   PLT 197 04/09/2018     STUDIES: Nm Pet Image Restag (ps) Skull Base To Thigh  Result Date: 03/20/2018 CLINICAL DATA:  Subsequent treatment strategy for right lung cancer. EXAM: NUCLEAR MEDICINE PET SKULL BASE TO THIGH TECHNIQUE: 7.4 mCi F-18 FDG was  injected intravenously. Full-ring PET imaging was performed from the skull base to thigh after the radiotracer. CT data was obtained and used for attenuation correction and anatomic localization. Fasting blood glucose: 85 mg/dl COMPARISON:  Multiple exams, including 08/08/2017, and CT chest from 03/06/2018 FINDINGS: Mediastinal blood pool activity: SUV max 2.0 NECK: Activity in the right scalene musculature without CT abnormality, likely physiologic/incidental. Incidental CT findings: Bilateral carotid atherosclerotic calcification. CHEST: Radiation pneumonitis in the right upper lobe with prominent reduction in the prior masslike lesion which is currently difficult to separate from the radiation pneumonitis. Maximum SUV in this vicinity 3.3, formerly 12.5. The prior right supraclavicular lymph node has resolved. Radiation pneumonitis in the space superior segment right lower lobe, with low-grade activity. 0.9 cm right lower lobe nodule on image 100/3 with maximum SUV 1.5. Additional bilateral smaller nodules are not appreciably hypermetabolic but are below sensitive PET-CT size thresholds; these nodules were documented as being progressive in size on the more recent  chest CT. The previous hypermetabolic adenopathy in the chest has resolved. Incidental CT findings: Right Port-A-Cath tip: Right atrium. Atherosclerotic calcification of the thoracic aorta and branch vessels. ABDOMEN/PELVIS: A hypermetabolic mass in segment 7 of the liver measures approximately 2.3 by 2.3 cm and has a maximum SUV of 11.8. A hypermetabolic mildly hypodense mass primarily in segment 4 of the liver measures 2.1 by 1.4 cm with maximum SUV 11.8. A 1.4 by 0.9 cm mass of the left adrenal gland is new compared to prior PET-CT and has a maximum SUV of 7.3 compatible with metastatic lesion. A small hypermetabolic soft tissue mass posterior to the right psoas muscle and along the upper border of the iliacus measures 0.9 by 0.5 cm on image 180/3  and has a maximum SUV of 4.0, compatible with a small metastatic focus. Incidental CT findings: Aortoiliac atherosclerotic vascular disease. SKELETON: Compared to the prior PET-CT there are new lytic metastatic lesions of the left acromion, right seventh rib, and left iliac bone compatible with osseous metastatic disease. The acromial lesion is only mildly lucent but has a maximum SUV of 6.9. The right rib lesion is notably lytic and expansile with a maximum SUV of 9.6. There are 2 lytic lesions in the left iliac bone, an anterior lesion with maximum SUV of 9.8 and a posterior lesion with maximum SUV 15.6. Both lesions demonstrate cortical destruction and could have a small soft tissue component. Incidental CT findings: None IMPRESSION: 1. Although the original primary lesion is markedly reduced in activity following radiation therapy, and the hypermetabolic thoracic adenopathy has resolved, there are hypermetabolic metastatic lesions in the skeleton, liver, left adrenal gland, right retroperitoneum, and probably to the lungs compatible with progressive malignancy. 2.  Aortic Atherosclerosis (ICD10-I70.0). Electronically Signed   By: Van Clines M.D.   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     PLAN:    1. 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  OmniSeq testing did not reveal any actionable mutations.  Patient wishes to continue with palliative treatment.  She tolerated cycle 1 of carboplatinum, pemetrexed, and Avastin last week without significant side effects.  Patient also receives Zometa  on the odd-numbered cycles for her bony lesions.  Patient will require B12 injections and folate supplement along with her pemetrexed.  Return to clinic in 2 weeks as previously scheduled for consideration of cycle 2.  Plan to reimage at the conclusion of cycle 4. 2.  Neutropenia: Secondary to chemotherapy.  Monitor. 3.  Anemia: Hemoglobin mildly improved at 11.8. 4.  Pain: Continue current narcotics.  Patient will initiate XRT in the next 1 to 2 weeks.  Patient expressed understanding and was in agreement with this plan. She also understands that She can call clinic at any time with any questions, concerns, or complaints.   Cancer Staging Cancer of upper lobe of right lung [facility]) Staging form: Lung, AJCC 8th Edition - Clinical stage from 08/24/2017: Stage IV (cT2a, cN3, cM1c) - Signed by Lloyd Huger, MD on 03/29/2018   Lloyd Huger, MD   04/12/2018 7:23 PM

## 2018-04-07 ENCOUNTER — Other Ambulatory Visit: Payer: Self-pay | Admitting: Unknown Physician Specialty

## 2018-04-08 ENCOUNTER — Other Ambulatory Visit: Payer: Self-pay

## 2018-04-08 ENCOUNTER — Ambulatory Visit (INDEPENDENT_AMBULATORY_CARE_PROVIDER_SITE_OTHER): Payer: Managed Care, Other (non HMO) | Admitting: Family Medicine

## 2018-04-08 ENCOUNTER — Encounter: Payer: Self-pay | Admitting: Family Medicine

## 2018-04-08 VITALS — BP 121/75 | HR 80 | Temp 98.4°F | Ht 60.0 in | Wt 126.1 lb

## 2018-04-08 DIAGNOSIS — I10 Essential (primary) hypertension: Secondary | ICD-10-CM

## 2018-04-08 MED ORDER — AMLODIPINE BESYLATE 5 MG PO TABS: 5.0000 mg | ORAL_TABLET | Freq: Every day | ORAL | 1 refills | Status: DC

## 2018-04-08 NOTE — Progress Notes (Signed)
BP 121/75   Pulse 80   Temp 98.4 F (36.9 C) (Oral)   Ht 5' (1.524 m)   Wt 126 lb 1.6 oz (57.2 kg)   LMP 08/08/2003 (Approximate) Comment: Hysterectomy 2004  SpO2 98%   BMI 24.63 kg/m    Subjective:    Patient ID: Janet [name], female    DOB: 10/06/1955, 62 y.o.   MRN: [id number]  HPI: Janet [name] is a 62 y.o. female  Chief Complaint  Patient presents with  . Hypertension   Here today for BP f/u after d/c of lisinopril. Taking amlodipine faithfully without side effects. Does not check BPs outside of clinic but WNL when checked at other appts. Denies CP, SOB.  Relevant past medical, surgical, family and social history reviewed and updated as indicated. Interim medical history since our last visit reviewed. Allergies and medications reviewed and updated.  Review of Systems  Per HPI unless specifically indicated above     Objective:    BP 121/75   Pulse 80   Temp 98.4 F (36.9 C) (Oral)   Ht 5' (1.524 m)   Wt 126 lb 1.6 oz (57.2 kg)   LMP 08/08/2003 (Approximate) Comment: Hysterectomy 2004  SpO2 98%   BMI 24.63 kg/m   Wt Readings from Last 3 Encounters:  04/09/18 126 lb 14 oz (57.6 kg)  04/09/18 126 lb 12.8 oz (57.5 kg)  04/08/18 126 lb 1.6 oz (57.2 kg)    Physical Exam  Constitutional: She is oriented to person, place, and time. She appears well-developed and well-nourished. No distress.  HENT:  Head: Atraumatic.  Eyes: Conjunctivae and EOM are normal.  Neck: Normal range of motion. Neck supple.  Cardiovascular: Normal rate and regular rhythm.  Pulmonary/Chest: Effort normal. No respiratory distress.  Musculoskeletal: Normal range of motion.  Neurological: She is alert and oriented to person, place, and time.  Skin: Skin is warm and dry.  Psychiatric: She has a normal mood and affect. Her behavior is normal.  Nursing note and vitals reviewed.   Results for orders placed or performed in visit on 04/01/18  Comprehensive metabolic panel    Result Value Ref Range   Sodium 139 135 - 145 mmol/L   Potassium 4.0 3.5 - 5.1 mmol/L   Chloride 103 98 - 111 mmol/L   CO2 26 22 - 32 mmol/L   Glucose, Bld 117 (H) 70 - 99 mg/dL   BUN 10 8 - 23 mg/dL   Creatinine, Ser 0.65 0.44 - 1.00 mg/dL   Calcium 9.3 8.9 - 10.3 mg/dL   Total Protein 7.1 6.5 - 8.1 g/dL   Albumin 4.0 3.5 - 5.0 g/dL   AST 26 15 - 41 U/L   ALT 13 0 - 44 U/L   Alkaline Phosphatase 69 38 - 126 U/L   Total Bilirubin 0.4 0.3 - 1.2 mg/dL   GFR calc non Af Amer >60 >60 mL/min   GFR calc Af Amer >60 >60 mL/min   Anion gap 10 5 - 15  CBC with Differential  Result Value Ref Range   WBC 3.8 3.6 - 11.0 K/uL   RBC 3.58 (L) 3.80 - 5.20 MIL/uL   Hemoglobin 11.2 (L) 12.0 - 16.0 g/dL   HCT 33.0 (L) 35.0 - 47.0 %   MCV 92.3 80.0 - 100.0 fL   MCH 31.3 26.0 - 34.0 pg   MCHC 33.9 32.0 - 36.0 g/dL   RDW 13.5 11.5 - 14.5 %   Platelets 252 150 - 440 K/uL  Neutrophils Relative % 72 %   Neutro Abs 2.7 1.4 - 6.5 K/uL   Lymphocytes Relative 17 %   Lymphs Abs 0.6 (L) 1.0 - 3.6 K/uL   Monocytes Relative 9 %   Monocytes Absolute 0.3 0.2 - 0.9 K/uL   Eosinophils Relative 1 %   Eosinophils Absolute 0.0 0 - 0.7 K/uL   Basophils Relative 1 %   Basophils Absolute 0.0 0 - 0.1 K/uL  Thyroid Panel With TSH  Result Value Ref Range   TSH 3.470 0.450 - 4.500 uIU/mL   T4, Total 10.7 4.5 - 12.0 ug/dL   T3 Uptake Ratio 28 24 - 39 %   Free Thyroxine Index 3.0 1.2 - 4.9  Protein, urine, random  Result Value Ref Range   Total Protein, Urine <6 mg/dL      Assessment & Plan:   Problem List Items Addressed This Visit      Cardiovascular and Mediastinum   Hypertension - Primary    Stable and WNL on only amlodipine. Continue current regimen          Follow up plan: Return in about 6 months (around 10/07/2018) for CPE.

## 2018-04-09 ENCOUNTER — Ambulatory Visit
Admission: RE | Admit: 2018-04-09 | Discharge: 2018-04-09 | Disposition: A | Discharge status: Home / Self Care | Payer: Managed Care, Other (non HMO) | Source: Ambulatory Visit | Attending: Radiation Oncology | Admitting: Radiation Oncology

## 2018-04-09 ENCOUNTER — Other Ambulatory Visit: Payer: Self-pay

## 2018-04-09 ENCOUNTER — Inpatient Hospital Stay: Payer: Managed Care, Other (non HMO) | Attending: Oncology

## 2018-04-09 ENCOUNTER — Encounter: Payer: Self-pay | Admitting: Radiation Oncology

## 2018-04-09 ENCOUNTER — Inpatient Hospital Stay (HOSPITAL_BASED_OUTPATIENT_CLINIC_OR_DEPARTMENT_OTHER): Payer: Managed Care, Other (non HMO) | Admitting: Oncology

## 2018-04-09 VITALS — BP 133/69 | HR 75 | Temp 95.8°F | Resp 18 | Wt 126.9 lb

## 2018-04-09 VITALS — BP 123/72 | HR 78 | Temp 95.8°F | Resp 18 | Wt 126.8 lb

## 2018-04-09 DIAGNOSIS — Z9221 Personal history of antineoplastic chemotherapy: Secondary | ICD-10-CM | POA: Insufficient documentation

## 2018-04-09 DIAGNOSIS — E785 Hyperlipidemia, unspecified: Secondary | ICD-10-CM

## 2018-04-09 DIAGNOSIS — Z923 Personal history of irradiation: Secondary | ICD-10-CM | POA: Diagnosis not present

## 2018-04-09 DIAGNOSIS — K769 Liver disease, unspecified: Secondary | ICD-10-CM | POA: Insufficient documentation

## 2018-04-09 DIAGNOSIS — C787 Secondary malignant neoplasm of liver and intrahepatic bile duct: Secondary | ICD-10-CM | POA: Insufficient documentation

## 2018-04-09 DIAGNOSIS — I10 Essential (primary) hypertension: Secondary | ICD-10-CM

## 2018-04-09 DIAGNOSIS — Z79899 Other long term (current) drug therapy: Secondary | ICD-10-CM

## 2018-04-09 DIAGNOSIS — Z5111 Encounter for antineoplastic chemotherapy: Secondary | ICD-10-CM | POA: Insufficient documentation

## 2018-04-09 DIAGNOSIS — M1990 Unspecified osteoarthritis, unspecified site: Secondary | ICD-10-CM | POA: Insufficient documentation

## 2018-04-09 DIAGNOSIS — R109 Unspecified abdominal pain: Secondary | ICD-10-CM | POA: Insufficient documentation

## 2018-04-09 DIAGNOSIS — C3411 Malignant neoplasm of upper lobe, right bronchus or lung: Secondary | ICD-10-CM

## 2018-04-09 DIAGNOSIS — C7951 Secondary malignant neoplasm of bone: Secondary | ICD-10-CM | POA: Diagnosis not present

## 2018-04-09 DIAGNOSIS — D649 Anemia, unspecified: Secondary | ICD-10-CM | POA: Insufficient documentation

## 2018-04-09 DIAGNOSIS — C786 Secondary malignant neoplasm of retroperitoneum and peritoneum: Secondary | ICD-10-CM | POA: Diagnosis not present

## 2018-04-09 DIAGNOSIS — Z87891 Personal history of nicotine dependence: Secondary | ICD-10-CM | POA: Insufficient documentation

## 2018-04-09 DIAGNOSIS — E279 Disorder of adrenal gland, unspecified: Secondary | ICD-10-CM | POA: Diagnosis not present

## 2018-04-09 DIAGNOSIS — M25552 Pain in left hip: Secondary | ICD-10-CM | POA: Insufficient documentation

## 2018-04-09 DIAGNOSIS — D701 Agranulocytosis secondary to cancer chemotherapy: Secondary | ICD-10-CM

## 2018-04-09 DIAGNOSIS — C7972 Secondary malignant neoplasm of left adrenal gland: Secondary | ICD-10-CM | POA: Diagnosis not present

## 2018-04-09 DIAGNOSIS — T451X5S Adverse effect of antineoplastic and immunosuppressive drugs, sequela: Secondary | ICD-10-CM | POA: Insufficient documentation

## 2018-04-09 DIAGNOSIS — E039 Hypothyroidism, unspecified: Secondary | ICD-10-CM | POA: Diagnosis not present

## 2018-04-09 DIAGNOSIS — M25512 Pain in left shoulder: Secondary | ICD-10-CM | POA: Diagnosis not present

## 2018-04-09 DIAGNOSIS — Z5112 Encounter for antineoplastic immunotherapy: Secondary | ICD-10-CM | POA: Diagnosis not present

## 2018-04-09 LAB — COMPREHENSIVE METABOLIC PANEL
ALK PHOS: 88 U/L (ref 38–126)
ALT: 20 U/L (ref 0–44)
AST: 26 U/L (ref 15–41)
Albumin: 4.1 g/dL (ref 3.5–5.0)
Anion gap: 6 (ref 5–15)
BUN: 10 mg/dL (ref 8–23)
CALCIUM: 8.6 mg/dL — AB (ref 8.9–10.3)
CHLORIDE: 105 mmol/L (ref 98–111)
CO2: 27 mmol/L (ref 22–32)
CREATININE: 0.73 mg/dL (ref 0.44–1.00)
GFR calc Af Amer: >60 mL/min (ref >60)
GFR calc non Af Amer: >60 mL/min (ref >60)
GLUCOSE: 101 mg/dL — AB (ref 70–99)
Potassium: 4.3 mmol/L (ref 3.5–5.1)
SODIUM: 138 mmol/L (ref 135–145)
Total Bilirubin: 0.3 mg/dL (ref 0.3–1.2)
Total Protein: 7.9 g/dL (ref 6.5–8.1)

## 2018-04-09 LAB — CBC WITH DIFFERENTIAL/PLATELET
Basophils Absolute: 0 10*3/uL (ref 0–0.1)
Basophils Relative: 0 %
EOS ABS: 0 10*3/uL (ref 0–0.7)
EOS PCT: 2 %
HCT: 35.1 % (ref 35.0–47.0)
HEMOGLOBIN: 11.8 g/dL — AB (ref 12.0–16.0)
LYMPHS ABS: 0.4 10*3/uL — AB (ref 1.0–3.6)
Lymphocytes Relative: 34 %
MCH: 30.7 pg (ref 26.0–34.0)
MCHC: 33.6 g/dL (ref 32.0–36.0)
MCV: 91.4 fL (ref 80.0–100.0)
MONO ABS: 0.1 10*3/uL — AB (ref 0.2–0.9)
MONOS PCT: 11 %
NEUTROS PCT: 53 %
Neutro Abs: 0.6 10*3/uL — ABNORMAL LOW (ref 1.4–6.5)
Platelets: 197 10*3/uL (ref 150–440)
RBC: 3.85 MIL/uL (ref 3.80–5.20)
RDW: 13.4 % (ref 11.5–14.5)
WBC: 1.2 10*3/uL — CL (ref 3.6–11.0)

## 2018-04-09 NOTE — Progress Notes (Signed)
Here for follow up. Stated Left hip -and starting to see Dr crystal again she stated. Returning Monday sept/9/19 to get ready for radiation she stated.

## 2018-04-09 NOTE — Progress Notes (Signed)
Radiation Oncology Follow up Note old patient new area bone metastases  Name: Janet [name]   Date:   04/09/2018 MRN:  [id number] DOB: 1955/10/23    This 62 y.o. 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 She's been doing fair now presents with increasing left hip pain and left shoulder pain..she's had an excellent response with complete eradication of disease in her chest although unfortunately she now has hypermetabolic metastatic lesions in her liver skeleton left adrenal gland and right retroperitoneum. She does have significant hypermetabolic activity in the left iliac wing as well as left acromion process. She is ambulating with difficulty and assistance of a cane.  COMPLICATIONS OF TREATMENT: none  FOLLOW UP COMPLIANCE: keeps appointments   PHYSICAL EXAM:  BP 133/69 (BP Location: Left Arm, Patient Position: Sitting)   Pulse 75   Temp (!) 95.8 F (35.4 C) (Tympanic)   Resp 18   Wt 126 lb 14 oz (57.6 kg)   LMP 08/08/2003 (Approximate) Comment: Hysterectomy 2004  BMI 24.78 kg/m  Range of motion of her left lower extremity doesn't elicit significant pain she has decreased weakness in her left lower external probably from pain. Range of motion of her left shoulder also elicits pain.Well-developed well-nourished patient in NAD. HEENT reveals PERLA, EOMI, discs not visualized.  Oral cavity is clear. No oral mucosal lesions are identified. Neck is clear without evidence of cervical or supraclavicular adenopathy. Lungs are clear to A&P. Cardiac examination is essentially unremarkable with regular rate and rhythm without murmur rub or  thrill. Abdomen is benign with no organomegaly or masses noted. Motor sensory and DTR levels are equal and symmetric in the upper and lower extremities. Cranial nerves II through XII are grossly intact. Proprioception is intact. No peripheral adenopathy or edema is identified. No motor or sensory levels are noted. Crude visual fields are within normal range.  RADIOLOGY RESULTS: PET CT scan reviewed and compatible with the above-stated findings  PLAN: at this time I to go ahead with palliative radiation therapy to her left iliac crest. Would treat at the 3000 cGy in 10 fractions. Also will focus on her left acromion process and give it single fraction 800 cGy 1 palliative course of treatment. Risks and benefits of treatment including skin reaction fatigue possible diarrhea from her pelvic radiation all were discussed in detail. We'll try with treatment planning to avoid small bowel is much is possible. I have personally set up and ordered CT simulation for early next week. Patient comprehends my treatment plan well.  I would like to take this opportunity to thank you for allowing me to participate in the care of your patient.Noreene Filbert, MD

## 2018-04-10 LAB — THYROID PANEL WITH TSH
Free Thyroxine Index: 2.8 (ref 1.2–4.9)
T3 Uptake Ratio: 27 % (ref 24–39)
T4, Total: 10.2 ug/dL (ref 4.5–12.0)
TSH: 3.45 u[IU]/mL (ref 0.450–4.500)

## 2018-04-12 NOTE — Patient Instructions (Signed)
Follow up in 6 months for CPE

## 2018-04-12 NOTE — Assessment & Plan Note (Signed)
Stable and WNL on only amlodipine. Continue current regimen

## 2018-04-13 ENCOUNTER — Ambulatory Visit
Admission: RE | Admit: 2018-04-13 | Discharge: 2018-04-13 | Disposition: A | Discharge status: Home / Self Care | Payer: Managed Care, Other (non HMO) | Source: Ambulatory Visit | Attending: Radiation Oncology | Admitting: Radiation Oncology

## 2018-04-13 DIAGNOSIS — C3411 Malignant neoplasm of upper lobe, right bronchus or lung: Secondary | ICD-10-CM | POA: Diagnosis present

## 2018-04-13 DIAGNOSIS — Z510 Encounter for antineoplastic radiation therapy: Secondary | ICD-10-CM | POA: Diagnosis not present

## 2018-04-18 NOTE — Progress Notes (Signed)
[place]  Telephone:(336) [phone] Fax:(336) [phone]  ID: Janet [name] OB: February 22, 1956  MR#: [id number]  JZP#:[id number]  [name], [name] [name], RN as Registered Nurse  CHIEF COMPLAINT: Progressive stage IV adenocarcinoma of the lung with metastatic disease in bones and liver.    INTERVAL HISTORY: Patient returns to clinic today for further evaluation and consideration of cycle 2 of carboplatinum, pemetrexed, and Avastin.  She continues to have left shoulder and left hip pain, but only initiated XRT today. She has no neurologic complaints.  She denies any recent fevers or illnesses. She denies any chest pain, shortness of breath, hemoptysis, or cough.  She has no nausea, vomiting, constipation, or diarrhea.  She has no melena or hematochezia.  She has no urinary complaints.  Patient offers no further specific complaints today.    REVIEW OF SYSTEMS:   Review of Systems  Constitutional: Negative.  Negative for fever, malaise/fatigue and weight loss.  Respiratory: Negative.  Negative for cough and shortness of breath.   Cardiovascular: Negative.  Negative for chest pain and leg swelling.  Gastrointestinal: Negative.  Negative for abdominal pain, blood in stool and melena.  Genitourinary: Positive for flank pain. Negative for dysuria.  Musculoskeletal: Positive for joint pain. Negative for back pain.  Skin: Negative.  Negative for rash.  Neurological: Negative.  Negative for sensory change, focal weakness, weakness and headaches.  Psychiatric/Behavioral: Negative.  The patient is not nervous/anxious.     As per HPI. Otherwise, a complete review of systems is negative.  PAST MEDICAL HISTORY: Past Medical History:  Diagnosis Date  . Arthritis    right shoulder  . Cancer of right lung ([place]) 08/2017   Chemo + rad tx's.   Marland Kitchen Hyperlipidemia   . Hypertension   . Hypothyroidism   . Menopausal  state   . Thyroid disease     PAST SURGICAL HISTORY: Past Surgical History:  Procedure Laterality Date  . ABDOMINAL HYSTERECTOMY  2005  . BREAST EXCISIONAL BIOPSY Right 1979  . COLONOSCOPY WITH PROPOFOL N/A 05/13/2017   Procedure: COLONOSCOPY WITH PROPOFOL;  Surgeon: [name], MD;  Location: [facility] ENDOSCOPY;  Service: Gastroenterology;  Laterality: N/A;  . CYSTECTOMY Right    breast  . CYSTECTOMY  02/2015   back of neck  . ESOPHAGOGASTRODUODENOSCOPY (EGD) WITH PROPOFOL N/A 05/13/2017   Procedure: ESOPHAGOGASTRODUODENOSCOPY (EGD) WITH PROPOFOL;  Surgeon: [name], MD;  Location: [facility] ENDOSCOPY;  Service: Gastroenterology;  Laterality: N/A;  . PORTA CATH INSERTION N/A 09/01/2017   Procedure: PORTA CATH INSERTION;  Surgeon: Algernon Huxley, MD;  Location: [place] CV LAB;  Service: Cardiovascular;  Laterality: N/A;  . TUBAL LIGATION  1986    FAMILY HISTORY: Family History  Problem Relation Age of Onset  . Hypertension Mother   . Stroke Mother   . Leukemia Mother   . Stroke Father   . Pneumonia Father   . Diabetes Sister   . Hyperlipidemia Sister   . Breast cancer Maternal Aunt 39    ADVANCED DIRECTIVES (Y/N):  N  HEALTH MAINTENANCE: Social History   Tobacco Use  . Smoking status: Former Smoker    Packs/day: 0.25    Types: Cigarettes    Last attempt to quit: 02/01/2017    Years since quitting: 1.2  . Smokeless tobacco: Never Used  Substance Use Topics  . Alcohol use: No    Alcohol/week: 0.0 standard drinks  . Drug use: No  Colonoscopy:  PAP:  Bone density:  Lipid panel:  No Known Allergies  Current Outpatient Medications  Medication Sig Dispense Refill  . amLODipine (NORVASC) 5 MG tablet Take 1 tablet (5 mg total) by mouth daily. 90 tablet 1  . atorvastatin (LIPITOR) 10 MG tablet TAKE 1 TABLET DAILY AT 6 P.M. 90 tablet 1  . folic acid (FOLVITE) 1 MG tablet Take 1 tablet (1 mg total) by mouth daily. 90 tablet 2  . levothyroxine (SYNTHROID, LEVOTHROID)  50 MCG tablet TAKE 1 TABLET DAILY 90 tablet 3  . lidocaine-prilocaine (EMLA) cream APP EXT AA 1 TIME  3  . Oxycodone HCl 10 MG TABS Take 1 tablet (10 mg total) by mouth every 6 (six) hours as needed. 120 tablet 0   No current facility-administered medications for this visit.    Facility-Administered Medications Ordered in Other Visits  Medication Dose Route Frequency Provider Last Rate Last Dose  . bevacizumab (AVASTIN) 900 mg in sodium chloride 0.9 % 100 mL chemo infusion  900 mg Intravenous Once Lloyd Huger, MD 272 mL/hr at 04/22/18 1044 900 mg at 04/22/18 1044  . CARBOplatin (PARAPLATIN) 480 mg in sodium chloride 0.9 % 250 mL chemo infusion  480 mg Intravenous Once Lloyd Huger, MD      . cyanocobalamin ((VITAMIN B-12)) injection 1,000 mcg  1,000 mcg Intramuscular Once Lloyd Huger, MD      . heparin lock flush 100 unit/mL  500 Units Intravenous Once Lloyd Huger, MD      . PEMEtrexed (ALIMTA) 800 mg in sodium chloride 0.9 % 100 mL chemo infusion  500 mg/m2 (Treatment Plan Recorded) Intravenous Once Lloyd Huger, MD      . sodium chloride flush (NS) 0.9 % injection 10 mL  10 mL Intravenous PRN Lloyd Huger, MD   10 mL at 04/22/18 0807    OBJECTIVE: Vitals:   04/22/18 0846  BP: 113/65  Pulse: 72  Resp: 18  Temp: (!) 94.2 F (34.6 C)     Body mass index is 24.72 kg/m.    ECOG FS:0 - Asymptomatic  General: Well-developed, well-nourished, no acute distress. Eyes: Pink conjunctiva, anicteric sclera. HEENT: Normocephalic, moist mucous membranes. Lungs: Clear to auscultation bilaterally. Heart: Regular rate and rhythm. No rubs, murmurs, or gallops. Abdomen: Soft, nontender, nondistended. No organomegaly noted, normoactive bowel sounds. Musculoskeletal: No edema, cyanosis, or clubbing. Neuro: Alert, answering all questions appropriately. Cranial nerves grossly intact. Skin: No rashes or petechiae noted. Psych: Normal affect.  LAB  RESULTS:  Lab Results  Component Value Date   NA 139 04/22/2018   K 3.8 04/22/2018   CL 105 04/22/2018   CO2 27 04/22/2018   GLUCOSE 112 (H) 04/22/2018   BUN 9 04/22/2018   CREATININE 0.82 04/22/2018   CALCIUM 8.5 (L) 04/22/2018   PROT 7.2 04/22/2018   ALBUMIN 3.7 04/22/2018   AST 25 04/22/2018   ALT 16 04/22/2018   ALKPHOS 80 04/22/2018   BILITOT 0.3 04/22/2018   GFRNONAA >60 04/22/2018   GFRAA >60 04/22/2018    Lab Results  Component Value Date   WBC 2.9 (L) 04/22/2018   NEUTROABS 1.9 04/22/2018   HGB 11.0 (L) 04/22/2018   HCT 31.6 (L) 04/22/2018   MCV 91.9 04/22/2018   PLT 342 04/22/2018     STUDIES: No results found.  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     PLAN:    1. Progressive stage IV adenocarcinoma  of the lung with metastatic disease in bones and liver: PET scan results from March 20, 2018 reviewed independently and reported as above with improvement of lung disease, but patient has new hypermetabolic lesions in her bones, liver, left adrenal gland, and right retroperitoneum consistent with progressive metastatic disease.  OmniSeq testing did not reveal any actionable mutations.  Patient wishes to continue with palliative treatment.  Proceed with cycle 2 of carboplatinum, pemetrexed, and Avastin.  Patient will also receive Zometa on the odd-numbered cycles for her bony lesions.  Patient will require B12 injections and folate supplement along with her pemetrexed.  Return to clinic in 3 weeks for further evaluation and consideration of cycle 3.  Plan to reimage at the conclusion of cycle 4 and consider maintenance pemetrexed and Avastin if improvement of disease.   2.  Neutropenia: Mildly improved.  Patient may require Neulasta in the near future. 3.  Anemia: Hemoglobin slowly trending down and is now 11.0.  Monitor.   4.  Pain: Continue current narcotics.  Patient has initiated XRT to her hip  today and will have XRT to her left shoulder in the coming weeks.  Patient expressed understanding and was in agreement with this plan. She also understands that She can call clinic at any time with any questions, concerns, or complaints.   Cancer Staging Cancer of upper lobe of right lung [facility]) Staging form: Lung, AJCC 8th Edition - Clinical stage from 08/24/2017: Stage IV (cT2a, cN3, cM1c) - Signed by Lloyd Huger, MD on 03/29/2018   Lloyd Huger, MD   04/22/2018 10:59 AM

## 2018-04-20 ENCOUNTER — Ambulatory Visit
Admission: RE | Admit: 2018-04-20 | Discharge: 2018-04-20 | Disposition: A | Discharge status: Home / Self Care | Payer: Managed Care, Other (non HMO) | Source: Ambulatory Visit | Attending: Radiation Oncology | Admitting: Radiation Oncology

## 2018-04-20 DIAGNOSIS — C3411 Malignant neoplasm of upper lobe, right bronchus or lung: Secondary | ICD-10-CM | POA: Diagnosis not present

## 2018-04-21 ENCOUNTER — Ambulatory Visit
Admission: RE | Admit: 2018-04-21 | Discharge: 2018-04-21 | Disposition: A | Discharge status: Home / Self Care | Payer: Managed Care, Other (non HMO) | Source: Ambulatory Visit | Attending: Radiation Oncology | Admitting: Radiation Oncology

## 2018-04-21 DIAGNOSIS — C3411 Malignant neoplasm of upper lobe, right bronchus or lung: Secondary | ICD-10-CM | POA: Diagnosis not present

## 2018-04-22 ENCOUNTER — Inpatient Hospital Stay: Payer: Managed Care, Other (non HMO)

## 2018-04-22 ENCOUNTER — Ambulatory Visit
Admission: RE | Admit: 2018-04-22 | Discharge: 2018-04-22 | Disposition: A | Discharge status: Home / Self Care | Payer: Managed Care, Other (non HMO) | Source: Ambulatory Visit | Attending: Radiation Oncology | Admitting: Radiation Oncology

## 2018-04-22 ENCOUNTER — Inpatient Hospital Stay (HOSPITAL_BASED_OUTPATIENT_CLINIC_OR_DEPARTMENT_OTHER): Payer: Managed Care, Other (non HMO) | Admitting: Oncology

## 2018-04-22 ENCOUNTER — Other Ambulatory Visit: Payer: Self-pay

## 2018-04-22 VITALS — BP 113/65 | HR 72 | Temp 94.2°F | Resp 18 | Wt 126.6 lb

## 2018-04-22 DIAGNOSIS — C7972 Secondary malignant neoplasm of left adrenal gland: Secondary | ICD-10-CM

## 2018-04-22 DIAGNOSIS — E785 Hyperlipidemia, unspecified: Secondary | ICD-10-CM

## 2018-04-22 DIAGNOSIS — D701 Agranulocytosis secondary to cancer chemotherapy: Secondary | ICD-10-CM

## 2018-04-22 DIAGNOSIS — T451X5S Adverse effect of antineoplastic and immunosuppressive drugs, sequela: Secondary | ICD-10-CM

## 2018-04-22 DIAGNOSIS — C7951 Secondary malignant neoplasm of bone: Secondary | ICD-10-CM | POA: Diagnosis not present

## 2018-04-22 DIAGNOSIS — C786 Secondary malignant neoplasm of retroperitoneum and peritoneum: Secondary | ICD-10-CM

## 2018-04-22 DIAGNOSIS — M25512 Pain in left shoulder: Secondary | ICD-10-CM

## 2018-04-22 DIAGNOSIS — C3411 Malignant neoplasm of upper lobe, right bronchus or lung: Secondary | ICD-10-CM | POA: Diagnosis not present

## 2018-04-22 DIAGNOSIS — E039 Hypothyroidism, unspecified: Secondary | ICD-10-CM

## 2018-04-22 DIAGNOSIS — I10 Essential (primary) hypertension: Secondary | ICD-10-CM

## 2018-04-22 DIAGNOSIS — M25552 Pain in left hip: Secondary | ICD-10-CM

## 2018-04-22 DIAGNOSIS — Z79899 Other long term (current) drug therapy: Secondary | ICD-10-CM

## 2018-04-22 DIAGNOSIS — M1990 Unspecified osteoarthritis, unspecified site: Secondary | ICD-10-CM

## 2018-04-22 DIAGNOSIS — Z87891 Personal history of nicotine dependence: Secondary | ICD-10-CM

## 2018-04-22 DIAGNOSIS — C787 Secondary malignant neoplasm of liver and intrahepatic bile duct: Secondary | ICD-10-CM | POA: Diagnosis not present

## 2018-04-22 DIAGNOSIS — D649 Anemia, unspecified: Secondary | ICD-10-CM

## 2018-04-22 LAB — COMPREHENSIVE METABOLIC PANEL
ALT: 16 U/L (ref 0–44)
ANION GAP: 7 (ref 5–15)
AST: 25 U/L (ref 15–41)
Albumin: 3.7 g/dL (ref 3.5–5.0)
Alkaline Phosphatase: 80 U/L (ref 38–126)
BUN: 9 mg/dL (ref 8–23)
CO2: 27 mmol/L (ref 22–32)
Calcium: 8.5 mg/dL — ABNORMAL LOW (ref 8.9–10.3)
Chloride: 105 mmol/L (ref 98–111)
Creatinine, Ser: 0.82 mg/dL (ref 0.44–1.00)
Glucose, Bld: 112 mg/dL — ABNORMAL HIGH (ref 70–99)
POTASSIUM: 3.8 mmol/L (ref 3.5–5.1)
Sodium: 139 mmol/L (ref 135–145)
Total Bilirubin: 0.3 mg/dL (ref 0.3–1.2)
Total Protein: 7.2 g/dL (ref 6.5–8.1)

## 2018-04-22 LAB — CBC WITH DIFFERENTIAL/PLATELET
Basophils Absolute: 0 10*3/uL (ref 0–0.1)
Basophils Relative: 1 %
EOS ABS: 0 10*3/uL (ref 0–0.7)
Eosinophils Relative: 0 %
HEMATOCRIT: 31.6 % — AB (ref 35.0–47.0)
HEMOGLOBIN: 11 g/dL — AB (ref 12.0–16.0)
LYMPHS ABS: 0.5 10*3/uL — AB (ref 1.0–3.6)
Lymphocytes Relative: 18 %
MCH: 31.8 pg (ref 26.0–34.0)
MCHC: 34.6 g/dL (ref 32.0–36.0)
MCV: 91.9 fL (ref 80.0–100.0)
Monocytes Absolute: 0.4 10*3/uL (ref 0.2–0.9)
Monocytes Relative: 13 %
NEUTROS ABS: 1.9 10*3/uL (ref 1.4–6.5)
NEUTROS PCT: 68 %
Platelets: 342 10*3/uL (ref 150–440)
RBC: 3.44 MIL/uL — AB (ref 3.80–5.20)
RDW: 13.6 % (ref 11.5–14.5)
WBC: 2.9 10*3/uL — AB (ref 3.6–11.0)

## 2018-04-22 MED ORDER — CYANOCOBALAMIN 1000 MCG/ML IJ SOLN
1000.0000 ug | Freq: Once | INTRAMUSCULAR | Status: AC
  Administered 2018-04-22: 1000 ug via INTRAMUSCULAR
  Filled 2018-04-22: qty 1

## 2018-04-22 MED ORDER — HEPARIN SOD (PORK) LOCK FLUSH 100 UNIT/ML IV SOLN
500.0000 [IU] | Freq: Once | INTRAVENOUS | Status: AC
  Administered 2018-04-22: 500 [IU] via INTRAVENOUS
  Filled 2018-04-22: qty 5

## 2018-04-22 MED ORDER — SODIUM CHLORIDE 0.9% FLUSH
10.0000 mL | INTRAVENOUS | Status: DC | PRN
  Administered 2018-04-22: 10 mL via INTRAVENOUS
  Filled 2018-04-22: qty 10

## 2018-04-22 MED ORDER — PALONOSETRON HCL INJECTION 0.25 MG/5ML
0.2500 mg | Freq: Once | INTRAVENOUS | Status: AC
  Administered 2018-04-22: 0.25 mg via INTRAVENOUS
  Filled 2018-04-22: qty 5

## 2018-04-22 MED ORDER — SODIUM CHLORIDE 0.9 % IV SOLN
500.0000 mg/m2 | Freq: Once | INTRAVENOUS | Status: AC
  Administered 2018-04-22: 800 mg via INTRAVENOUS
  Filled 2018-04-22: qty 20

## 2018-04-22 MED ORDER — SODIUM CHLORIDE 0.9 % IV SOLN
Freq: Once | INTRAVENOUS | Status: AC
  Administered 2018-04-22: 10:00:00 via INTRAVENOUS
  Filled 2018-04-22: qty 5

## 2018-04-22 MED ORDER — SODIUM CHLORIDE 0.9 % IV SOLN
Freq: Once | INTRAVENOUS | Status: AC
  Administered 2018-04-22: 10:00:00 via INTRAVENOUS
  Filled 2018-04-22: qty 250

## 2018-04-22 MED ORDER — SODIUM CHLORIDE 0.9 % IV SOLN
480.0000 mg | Freq: Once | INTRAVENOUS | Status: AC
  Administered 2018-04-22: 480 mg via INTRAVENOUS
  Filled 2018-04-22: qty 48

## 2018-04-22 MED ORDER — SODIUM CHLORIDE 0.9 % IV SOLN
900.0000 mg | Freq: Once | INTRAVENOUS | Status: AC
  Administered 2018-04-22: 900 mg via INTRAVENOUS
  Filled 2018-04-22: qty 32

## 2018-04-22 NOTE — Progress Notes (Signed)
Here for follow up " I feel pretty good " per pt -no voiced c/o she stated

## 2018-04-23 ENCOUNTER — Ambulatory Visit
Admission: RE | Admit: 2018-04-23 | Discharge: 2018-04-23 | Disposition: A | Discharge status: Home / Self Care | Payer: Managed Care, Other (non HMO) | Source: Ambulatory Visit | Attending: Radiation Oncology | Admitting: Radiation Oncology

## 2018-04-23 DIAGNOSIS — C3411 Malignant neoplasm of upper lobe, right bronchus or lung: Secondary | ICD-10-CM | POA: Diagnosis not present

## 2018-04-23 LAB — THYROID PANEL WITH TSH
FREE THYROXINE INDEX: 2.6 (ref 1.2–4.9)
T3 UPTAKE RATIO: 28 % (ref 24–39)
T4, Total: 9.2 ug/dL (ref 4.5–12.0)
TSH: 2.21 u[IU]/mL (ref 0.450–4.500)

## 2018-04-24 ENCOUNTER — Ambulatory Visit
Admission: RE | Admit: 2018-04-24 | Discharge: 2018-04-24 | Disposition: A | Discharge status: Home / Self Care | Payer: Managed Care, Other (non HMO) | Source: Ambulatory Visit | Attending: Radiation Oncology | Admitting: Radiation Oncology

## 2018-04-24 DIAGNOSIS — C3411 Malignant neoplasm of upper lobe, right bronchus or lung: Secondary | ICD-10-CM | POA: Diagnosis not present

## 2018-04-27 ENCOUNTER — Ambulatory Visit
Admission: RE | Admit: 2018-04-27 | Discharge: 2018-04-27 | Disposition: A | Discharge status: Home / Self Care | Payer: Managed Care, Other (non HMO) | Source: Ambulatory Visit | Attending: Radiation Oncology | Admitting: Radiation Oncology

## 2018-04-27 DIAGNOSIS — C3411 Malignant neoplasm of upper lobe, right bronchus or lung: Secondary | ICD-10-CM | POA: Diagnosis not present

## 2018-04-28 ENCOUNTER — Ambulatory Visit
Admission: RE | Admit: 2018-04-28 | Discharge: 2018-04-28 | Disposition: A | Discharge status: Home / Self Care | Payer: Managed Care, Other (non HMO) | Source: Ambulatory Visit | Attending: Radiation Oncology | Admitting: Radiation Oncology

## 2018-04-28 DIAGNOSIS — C3411 Malignant neoplasm of upper lobe, right bronchus or lung: Secondary | ICD-10-CM | POA: Diagnosis not present

## 2018-04-29 ENCOUNTER — Ambulatory Visit
Admission: RE | Admit: 2018-04-29 | Discharge: 2018-04-29 | Disposition: A | Discharge status: Home / Self Care | Payer: Managed Care, Other (non HMO) | Source: Ambulatory Visit | Attending: Radiation Oncology | Admitting: Radiation Oncology

## 2018-04-29 DIAGNOSIS — C3411 Malignant neoplasm of upper lobe, right bronchus or lung: Secondary | ICD-10-CM | POA: Diagnosis not present

## 2018-04-30 ENCOUNTER — Ambulatory Visit
Admission: RE | Admit: 2018-04-30 | Discharge: 2018-04-30 | Disposition: A | Discharge status: Home / Self Care | Payer: Managed Care, Other (non HMO) | Source: Ambulatory Visit | Attending: Radiation Oncology | Admitting: Radiation Oncology

## 2018-04-30 DIAGNOSIS — C3411 Malignant neoplasm of upper lobe, right bronchus or lung: Secondary | ICD-10-CM | POA: Diagnosis not present

## 2018-05-01 ENCOUNTER — Ambulatory Visit
Admission: RE | Admit: 2018-05-01 | Discharge: 2018-05-01 | Disposition: A | Discharge status: Home / Self Care | Payer: Managed Care, Other (non HMO) | Source: Ambulatory Visit | Attending: Radiation Oncology | Admitting: Radiation Oncology

## 2018-05-01 DIAGNOSIS — C3411 Malignant neoplasm of upper lobe, right bronchus or lung: Secondary | ICD-10-CM | POA: Diagnosis not present

## 2018-05-04 ENCOUNTER — Ambulatory Visit
Admission: RE | Admit: 2018-05-04 | Discharge: 2018-05-04 | Disposition: A | Discharge status: Home / Self Care | Payer: Managed Care, Other (non HMO) | Source: Ambulatory Visit | Attending: Radiation Oncology | Admitting: Radiation Oncology

## 2018-05-04 DIAGNOSIS — C3411 Malignant neoplasm of upper lobe, right bronchus or lung: Secondary | ICD-10-CM | POA: Diagnosis not present

## 2018-05-07 ENCOUNTER — Ambulatory Visit
Admission: RE | Admit: 2018-05-07 | Discharge: 2018-05-07 | Disposition: A | Discharge status: Home / Self Care | Payer: Managed Care, Other (non HMO) | Source: Ambulatory Visit | Attending: Radiation Oncology | Admitting: Radiation Oncology

## 2018-05-07 DIAGNOSIS — Z510 Encounter for antineoplastic radiation therapy: Secondary | ICD-10-CM | POA: Insufficient documentation

## 2018-05-07 DIAGNOSIS — C3411 Malignant neoplasm of upper lobe, right bronchus or lung: Secondary | ICD-10-CM | POA: Diagnosis present

## 2018-05-11 NOTE — Progress Notes (Signed)
[place]  Telephone:(336) [phone] Fax:(336) [phone]  ID: Janet [name] OB: 14-Aug-1955  MR#: [id number]  PJA#:[id number]  [name], [name] [name], RN as Registered Nurse  CHIEF COMPLAINT: Progressive stage IV adenocarcinoma of the lung with metastatic disease in bones and liver.    INTERVAL HISTORY: Patient returns to clinic today for further evaluation and consideration of cycle 3 of carboplatinum, pemetrexed, and Avastin.  Her pain is much better controlled now that she is completed XRT.  She has increased weakness and fatigue, but otherwise feels well.  She has no neurologic complaints.  She denies any recent fevers or illnesses. She denies any chest pain, shortness of breath, hemoptysis, or cough.  She has no nausea, vomiting, constipation, or diarrhea.  She has no melena or hematochezia.  She has no urinary complaints.  Patient offers no further specific complaints today.  REVIEW OF SYSTEMS:   Review of Systems  Constitutional: Positive for malaise/fatigue. Negative for fever and weight loss.  Respiratory: Negative.  Negative for cough and shortness of breath.   Cardiovascular: Negative.  Negative for chest pain and leg swelling.  Gastrointestinal: Negative.  Negative for abdominal pain, blood in stool and melena.  Genitourinary: Negative.  Negative for dysuria and flank pain.  Musculoskeletal: Negative.  Negative for back pain and joint pain.  Skin: Negative.  Negative for rash.  Neurological: Positive for weakness. Negative for sensory change, focal weakness and headaches.  Psychiatric/Behavioral: Negative.  The patient is not nervous/anxious.     As per HPI. Otherwise, a complete review of systems is negative.  PAST MEDICAL HISTORY: Past Medical History:  Diagnosis Date  . Arthritis    right shoulder  . Cancer of right lung ([place]) 08/2017   Chemo + rad tx's.   Marland Kitchen Hyperlipidemia   .  Hypertension   . Hypothyroidism   . Menopausal state   . Thyroid disease     PAST SURGICAL HISTORY: Past Surgical History:  Procedure Laterality Date  . ABDOMINAL HYSTERECTOMY  2005  . BREAST EXCISIONAL BIOPSY Right 1979  . COLONOSCOPY WITH PROPOFOL N/A 05/13/2017   Procedure: COLONOSCOPY WITH PROPOFOL;  Surgeon: [name], MD;  Location: [facility] ENDOSCOPY;  Service: Gastroenterology;  Laterality: N/A;  . CYSTECTOMY Right    breast  . CYSTECTOMY  02/2015   back of neck  . ESOPHAGOGASTRODUODENOSCOPY (EGD) WITH PROPOFOL N/A 05/13/2017   Procedure: ESOPHAGOGASTRODUODENOSCOPY (EGD) WITH PROPOFOL;  Surgeon: [name], MD;  Location: [facility] ENDOSCOPY;  Service: Gastroenterology;  Laterality: N/A;  . PORTA CATH INSERTION N/A 09/01/2017   Procedure: PORTA CATH INSERTION;  Surgeon: Algernon Huxley, MD;  Location: [place] CV LAB;  Service: Cardiovascular;  Laterality: N/A;  . TUBAL LIGATION  1986    FAMILY HISTORY: Family History  Problem Relation Age of Onset  . Hypertension Mother   . Stroke Mother   . Leukemia Mother   . Stroke Father   . Pneumonia Father   . Diabetes Sister   . Hyperlipidemia Sister   . Breast cancer Maternal Aunt 64    ADVANCED DIRECTIVES (Y/N):  N  HEALTH MAINTENANCE: Social History   Tobacco Use  . Smoking status: Former Smoker    Packs/day: 0.25    Types: Cigarettes    Last attempt to quit: 02/01/2017    Years since quitting: 1.2  . Smokeless tobacco: Never Used  Substance Use Topics  . Alcohol use: No    Alcohol/week: 0.0 standard drinks  .  Drug use: No     Colonoscopy:  PAP:  Bone density:  Lipid panel:  No Known Allergies  Current Outpatient Medications  Medication Sig Dispense Refill  . amLODipine (NORVASC) 5 MG tablet Take 1 tablet (5 mg total) by mouth daily. 90 tablet 1  . atorvastatin (LIPITOR) 10 MG tablet TAKE 1 TABLET DAILY AT 6 P.M. 90 tablet 1  . folic acid (FOLVITE) 1 MG tablet Take 1 tablet (1 mg total) by mouth daily. 90  tablet 2  . levothyroxine (SYNTHROID, LEVOTHROID) 50 MCG tablet TAKE 1 TABLET DAILY 90 tablet 3  . lidocaine-prilocaine (EMLA) cream APP EXT AA 1 TIME  3  . Oxycodone HCl 10 MG TABS Take 1 tablet (10 mg total) by mouth every 6 (six) hours as needed. 120 tablet 0   No current facility-administered medications for this visit.     OBJECTIVE: Vitals:   05/13/18 0855  BP: 131/79  Pulse: 80  Resp: 18  Temp: (!) 96.3 F (35.7 C)  SpO2: 100%     Body mass index is 23.14 kg/m.    ECOG FS:0 - Asymptomatic  General: Well-developed, well-nourished, no acute distress. Eyes: Pink conjunctiva, anicteric sclera. HEENT: Normocephalic, moist mucous membranes. Lungs: Clear to auscultation bilaterally. Heart: Regular rate and rhythm. No rubs, murmurs, or gallops. Abdomen: Soft, nontender, nondistended. No organomegaly noted, normoactive bowel sounds. Musculoskeletal: No edema, cyanosis, or clubbing. Neuro: Alert, answering all questions appropriately. Cranial nerves grossly intact. Skin: No rashes or petechiae noted. Psych: Normal affect.  LAB RESULTS:  Lab Results  Component Value Date   NA 140 05/13/2018   K 3.4 (L) 05/13/2018   CL 106 05/13/2018   CO2 25 05/13/2018   GLUCOSE 147 (H) 05/13/2018   BUN 8 05/13/2018   CREATININE 0.79 05/13/2018   CALCIUM 9.2 05/13/2018   PROT 7.4 05/13/2018   ALBUMIN 4.1 05/13/2018   AST 38 05/13/2018   ALT 28 05/13/2018   ALKPHOS 79 05/13/2018   BILITOT 0.4 05/13/2018   GFRNONAA >60 05/13/2018   GFRAA >60 05/13/2018    Lab Results  Component Value Date   WBC 3.0 (L) 05/13/2018   NEUTROABS 2.2 05/13/2018   HGB 10.7 (L) 05/13/2018   HCT 32.6 (L) 05/13/2018   MCV 92.6 05/13/2018   PLT 205 05/13/2018     STUDIES: No results found.  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     PLAN:    1. Progressive stage IV adenocarcinoma of the lung with metastatic disease in bones and  liver: PET scan results from March 20, 2018 reviewed independently with improvement of lung disease, but patient has new hypermetabolic lesions in her bones, liver, left adrenal gland, and right retroperitoneum consistent with progressive metastatic disease.  OmniSeq testing did not reveal any actionable mutations.  Patient wishes to continue with palliative treatment.  Proceed with cycle 3 of carboplatinum, pemetrexed, and Avastin.  Patient will also receive Zometa today.  She will require B12 injections and folate supplement along with her pemetrexed.  Return to clinic in 3 weeks for further evaluation and consideration of cycle 4.  Plan to reimage at the conclusion of cycle 4.  If improvement of disease, can consider maintenance pemetrexed and Avastin.   2.  Leukopenia: Mildly improved.  Patient may require Neulasta in the near future.  Proceed with treatment as above. 3.  Anemia: Hemoglobin continues to trend down slightly and is now 10.7. 4.  Pain: Improved with XRT.  Continue current narcotic regimen. 5.  Palliative care: Patient was given a consult to see palliative care at her next clinic visit.    Patient expressed understanding and was in agreement with this plan. She also understands that She can call clinic at any time with any questions, concerns, or complaints.   Cancer Staging Cancer of upper lobe of right lung [facility]) Staging form: Lung, AJCC 8th Edition - Clinical stage from 08/24/2017: Stage IV (cT2a, cN3, cM1c) - Signed by Lloyd Huger, MD on 03/29/2018   Lloyd Huger, MD   05/15/2018 10:22 AM

## 2018-05-13 ENCOUNTER — Inpatient Hospital Stay: Payer: Managed Care, Other (non HMO) | Attending: Oncology

## 2018-05-13 ENCOUNTER — Inpatient Hospital Stay: Payer: Managed Care, Other (non HMO) | Admitting: Hospice and Palliative Medicine

## 2018-05-13 ENCOUNTER — Encounter: Payer: Self-pay | Admitting: Oncology

## 2018-05-13 ENCOUNTER — Other Ambulatory Visit: Payer: Self-pay | Admitting: *Deleted

## 2018-05-13 ENCOUNTER — Inpatient Hospital Stay (HOSPITAL_BASED_OUTPATIENT_CLINIC_OR_DEPARTMENT_OTHER): Payer: Managed Care, Other (non HMO) | Admitting: Oncology

## 2018-05-13 ENCOUNTER — Inpatient Hospital Stay: Payer: Managed Care, Other (non HMO)

## 2018-05-13 VITALS — BP 131/79 | HR 80 | Temp 96.3°F | Resp 18 | Wt 118.5 lb

## 2018-05-13 DIAGNOSIS — Z923 Personal history of irradiation: Secondary | ICD-10-CM

## 2018-05-13 DIAGNOSIS — E039 Hypothyroidism, unspecified: Secondary | ICD-10-CM

## 2018-05-13 DIAGNOSIS — Z5111 Encounter for antineoplastic chemotherapy: Secondary | ICD-10-CM | POA: Diagnosis not present

## 2018-05-13 DIAGNOSIS — C787 Secondary malignant neoplasm of liver and intrahepatic bile duct: Secondary | ICD-10-CM

## 2018-05-13 DIAGNOSIS — C3411 Malignant neoplasm of upper lobe, right bronchus or lung: Secondary | ICD-10-CM | POA: Insufficient documentation

## 2018-05-13 DIAGNOSIS — C7951 Secondary malignant neoplasm of bone: Secondary | ICD-10-CM

## 2018-05-13 DIAGNOSIS — D72819 Decreased white blood cell count, unspecified: Secondary | ICD-10-CM | POA: Insufficient documentation

## 2018-05-13 DIAGNOSIS — R531 Weakness: Secondary | ICD-10-CM

## 2018-05-13 DIAGNOSIS — M1990 Unspecified osteoarthritis, unspecified site: Secondary | ICD-10-CM

## 2018-05-13 DIAGNOSIS — E785 Hyperlipidemia, unspecified: Secondary | ICD-10-CM | POA: Diagnosis not present

## 2018-05-13 DIAGNOSIS — Z79899 Other long term (current) drug therapy: Secondary | ICD-10-CM | POA: Insufficient documentation

## 2018-05-13 DIAGNOSIS — R5381 Other malaise: Secondary | ICD-10-CM

## 2018-05-13 DIAGNOSIS — I10 Essential (primary) hypertension: Secondary | ICD-10-CM | POA: Diagnosis not present

## 2018-05-13 DIAGNOSIS — Z803 Family history of malignant neoplasm of breast: Secondary | ICD-10-CM | POA: Diagnosis not present

## 2018-05-13 DIAGNOSIS — D649 Anemia, unspecified: Secondary | ICD-10-CM

## 2018-05-13 DIAGNOSIS — Z87891 Personal history of nicotine dependence: Secondary | ICD-10-CM

## 2018-05-13 DIAGNOSIS — R5383 Other fatigue: Secondary | ICD-10-CM

## 2018-05-13 DIAGNOSIS — Z806 Family history of leukemia: Secondary | ICD-10-CM | POA: Diagnosis not present

## 2018-05-13 LAB — CBC WITH DIFFERENTIAL/PLATELET
Abs Immature Granulocytes: 0.02 10*3/uL (ref 0.00–0.07)
BASOS ABS: 0 10*3/uL (ref 0.0–0.1)
Basophils Relative: 1 %
EOS PCT: 1 %
Eosinophils Absolute: 0 10*3/uL (ref 0.0–0.5)
HEMATOCRIT: 32.6 % — AB (ref 36.0–46.0)
HEMOGLOBIN: 10.7 g/dL — AB (ref 12.0–15.0)
IMMATURE GRANULOCYTES: 1 %
LYMPHS ABS: 0.4 10*3/uL — AB (ref 0.7–4.0)
LYMPHS PCT: 12 %
MCH: 30.4 pg (ref 26.0–34.0)
MCHC: 32.8 g/dL (ref 30.0–36.0)
MCV: 92.6 fL (ref 80.0–100.0)
Monocytes Absolute: 0.4 10*3/uL (ref 0.1–1.0)
Monocytes Relative: 12 %
NEUTROS PCT: 73 %
NRBC: 0 % (ref 0.0–0.2)
Neutro Abs: 2.2 10*3/uL (ref 1.7–7.7)
Platelets: 205 10*3/uL (ref 150–400)
RBC: 3.52 MIL/uL — AB (ref 3.87–5.11)
RDW: 13.5 % (ref 11.5–15.5)
WBC: 3 10*3/uL — AB (ref 4.0–10.5)

## 2018-05-13 LAB — COMPREHENSIVE METABOLIC PANEL
ALT: 28 U/L (ref 0–44)
ANION GAP: 9 (ref 5–15)
AST: 38 U/L (ref 15–41)
Albumin: 4.1 g/dL (ref 3.5–5.0)
Alkaline Phosphatase: 79 U/L (ref 38–126)
BUN: 8 mg/dL (ref 8–23)
CHLORIDE: 106 mmol/L (ref 98–111)
CO2: 25 mmol/L (ref 22–32)
CREATININE: 0.79 mg/dL (ref 0.44–1.00)
Calcium: 9.2 mg/dL (ref 8.9–10.3)
GFR calc Af Amer: >60 mL/min (ref >60)
Glucose, Bld: 147 mg/dL — ABNORMAL HIGH (ref 70–99)
POTASSIUM: 3.4 mmol/L — AB (ref 3.5–5.1)
SODIUM: 140 mmol/L (ref 135–145)
Total Bilirubin: 0.4 mg/dL (ref 0.3–1.2)
Total Protein: 7.4 g/dL (ref 6.5–8.1)

## 2018-05-13 LAB — PROTEIN, URINE, RANDOM: Total Protein, Urine: <6 mg/dL

## 2018-05-13 MED ORDER — SODIUM CHLORIDE 0.9 % IV SOLN
15.0000 mg/kg | Freq: Once | INTRAVENOUS | Status: AC
  Administered 2018-05-13: 800 mg via INTRAVENOUS
  Filled 2018-05-13: qty 32

## 2018-05-13 MED ORDER — SODIUM CHLORIDE 0.9 % IV SOLN: 14.4000 mg/kg | Freq: Once | INTRAVENOUS | Status: DC

## 2018-05-13 MED ORDER — SODIUM CHLORIDE 0.9% FLUSH
10.0000 mL | INTRAVENOUS | Status: DC | PRN
  Administered 2018-05-13: 10 mL via INTRAVENOUS
  Filled 2018-05-13: qty 10

## 2018-05-13 MED ORDER — CYANOCOBALAMIN 1000 MCG/ML IJ SOLN
1000.0000 ug | Freq: Once | INTRAMUSCULAR | Status: AC
  Administered 2018-05-13: 1000 ug via INTRAMUSCULAR
  Filled 2018-05-13: qty 1

## 2018-05-13 MED ORDER — PALONOSETRON HCL INJECTION 0.25 MG/5ML
0.2500 mg | Freq: Once | INTRAVENOUS | Status: AC
  Administered 2018-05-13: 0.25 mg via INTRAVENOUS
  Filled 2018-05-13: qty 5

## 2018-05-13 MED ORDER — SODIUM CHLORIDE 0.9 % IV SOLN
500.0000 mg/m2 | Freq: Once | INTRAVENOUS | Status: AC
  Administered 2018-05-13: 800 mg via INTRAVENOUS
  Filled 2018-05-13: qty 20

## 2018-05-13 MED ORDER — SODIUM CHLORIDE 0.9 % IV SOLN
Freq: Once | INTRAVENOUS | Status: AC
  Administered 2018-05-13: 10:00:00 via INTRAVENOUS
  Filled 2018-05-13: qty 250

## 2018-05-13 MED ORDER — OXYCODONE HCL 10 MG PO TABS: 10.0000 mg | ORAL_TABLET | Freq: Four times a day (QID) | ORAL | 0 refills | Status: AC | PRN

## 2018-05-13 MED ORDER — SODIUM CHLORIDE 0.9 % IV SOLN
480.5000 mg | Freq: Once | INTRAVENOUS | Status: AC
  Administered 2018-05-13: 480 mg via INTRAVENOUS
  Filled 2018-05-13: qty 48

## 2018-05-13 MED ORDER — HEPARIN SOD (PORK) LOCK FLUSH 100 UNIT/ML IV SOLN
500.0000 [IU] | Freq: Once | INTRAVENOUS | Status: AC
  Administered 2018-05-13: 500 [IU] via INTRAVENOUS
  Filled 2018-05-13: qty 5

## 2018-05-13 MED ORDER — ZOLEDRONIC ACID 4 MG/100ML IV SOLN
4.0000 mg | Freq: Once | INTRAVENOUS | Status: AC
  Administered 2018-05-13: 4 mg via INTRAVENOUS
  Filled 2018-05-13: qty 100

## 2018-05-13 MED ORDER — SODIUM CHLORIDE 0.9 % IV SOLN
Freq: Once | INTRAVENOUS | Status: AC
  Administered 2018-05-13: 11:00:00 via INTRAVENOUS
  Filled 2018-05-13: qty 5

## 2018-05-13 NOTE — Progress Notes (Signed)
Dr.Finnegan ok with reducing Avastin dose to 800 mg.

## 2018-05-13 NOTE — Progress Notes (Signed)
Pt in for follow up, reports decreased appetite, weight down 8lbs.

## 2018-05-14 LAB — THYROID PANEL WITH TSH
FREE THYROXINE INDEX: 2.4 (ref 1.2–4.9)
T3 UPTAKE RATIO: 27 % (ref 24–39)
T4, Total: 9 ug/dL (ref 4.5–12.0)
TSH: 1.71 u[IU]/mL (ref 0.450–4.500)

## 2018-05-31 NOTE — Progress Notes (Signed)
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    INTERVAL HISTORY: Patient returns to clinic today for further evaluation and consideration of cycle 4 of carboplatinum, pemetrexed, and Avastin.  She does not complain of pain today, but has decreased range of motion of her left shoulder.  She continues to have chronic weakness and fatigue.  She has no neurologic complaints.  She denies any recent fevers or illnesses. She denies any chest pain, shortness of breath, hemoptysis, or cough.  She has no nausea, vomiting, constipation, or diarrhea.  She has no melena or hematochezia.  She has no urinary complaints.  Patient offers no further specific complaints today.  REVIEW OF SYSTEMS:   Review of Systems  Constitutional: Positive for malaise/fatigue. Negative for fever and weight loss.  Respiratory: Negative.  Negative for cough and shortness of breath.   Cardiovascular: Negative.  Negative for chest pain and leg swelling.  Gastrointestinal: Negative.  Negative for abdominal pain, blood in stool and melena.  Genitourinary: Negative.  Negative for dysuria and flank pain.  Musculoskeletal: Positive for joint pain. Negative for back pain.  Skin: Negative.  Negative for rash.  Neurological: Positive for weakness. Negative for sensory change, focal weakness and headaches.  Psychiatric/Behavioral: Negative.  The patient is not nervous/anxious.     As per HPI. Otherwise, a complete review of systems is negative.  PAST MEDICAL HISTORY: Past Medical History:  Diagnosis Date  . Arthritis    right shoulder  . Cancer of right lung ([place]) 08/2017   Chemo + rad tx's.   Marland Kitchen  Hyperlipidemia   . Hypertension   . Hypothyroidism   . Menopausal state   . Thyroid disease     PAST SURGICAL HISTORY: Past Surgical History:  Procedure Laterality Date  . ABDOMINAL HYSTERECTOMY  2005  . BREAST EXCISIONAL BIOPSY Right 1979  . COLONOSCOPY WITH PROPOFOL N/A 05/13/2017   Procedure: COLONOSCOPY WITH PROPOFOL;  Surgeon: [name], MD;  Location: [facility] ENDOSCOPY;  Service: Gastroenterology;  Laterality: N/A;  . CYSTECTOMY Right    breast  . CYSTECTOMY  02/2015   back of neck  . ESOPHAGOGASTRODUODENOSCOPY (EGD) WITH PROPOFOL N/A 05/13/2017   Procedure: ESOPHAGOGASTRODUODENOSCOPY (EGD) WITH PROPOFOL;  Surgeon: [name], MD;  Location: [facility] ENDOSCOPY;  Service: Gastroenterology;  Laterality: N/A;  . PORTA CATH INSERTION N/A 09/01/2017   Procedure: PORTA CATH INSERTION;  Surgeon: Algernon Huxley, MD;  Location: [place] CV LAB;  Service: Cardiovascular;  Laterality: N/A;  . TUBAL LIGATION  1986    FAMILY HISTORY: Family History  Problem Relation Age of Onset  . Hypertension Mother   . Stroke Mother   . Leukemia Mother   . Stroke Father   . Pneumonia Father   . Diabetes Sister   . Hyperlipidemia Sister   . Breast cancer Maternal Aunt 4    ADVANCED DIRECTIVES (Y/N):  N  HEALTH MAINTENANCE: Social History   Tobacco Use  . Smoking status: Former Smoker    Packs/day: 0.25    Types: Cigarettes    Last attempt to quit: 02/01/2017    Years since quitting: 1.3  . Smokeless tobacco: Never Used  Substance Use Topics  . Alcohol use: No    Alcohol/week: 0.0  standard drinks  . Drug use: No     Colonoscopy:  PAP:  Bone density:  Lipid panel:  No Known Allergies  Current Outpatient Medications  Medication Sig Dispense Refill  . amLODipine (NORVASC) 5 MG tablet Take 1 tablet (5 mg total) by mouth daily. 90 tablet 1  . atorvastatin (LIPITOR) 10 MG tablet TAKE 1 TABLET DAILY AT 6 P.M. 90 tablet 1  . folic acid (FOLVITE) 1 MG tablet Take 1 tablet (1 mg total)  by mouth daily. 90 tablet 2  . levothyroxine (SYNTHROID, LEVOTHROID) 50 MCG tablet TAKE 1 TABLET DAILY 90 tablet 3  . lidocaine-prilocaine (EMLA) cream APP EXT AA 1 TIME  3  . Oxycodone HCl 10 MG TABS Take 1 tablet (10 mg total) by mouth every 6 (six) hours as needed. 120 tablet 0   No current facility-administered medications for this visit.     OBJECTIVE: Vitals:   06/02/18 1106 06/02/18 1108  BP:  132/81  Pulse:  77  Resp: 12   Temp:  98 F (36.7 C)     Body mass index is 23.14 kg/m.    ECOG FS:0 - Asymptomatic  General: Well-developed, well-nourished, no acute distress. Eyes: Pink conjunctiva, anicteric sclera. HEENT: Normocephalic, moist mucous membranes. Lungs: Clear to auscultation bilaterally. Heart: Regular rate and rhythm. No rubs, murmurs, or gallops. Abdomen: Soft, nontender, nondistended. No organomegaly noted, normoactive bowel sounds. Musculoskeletal: No edema, cyanosis, or clubbing.  Decreased range of motion of left shoulder. Neuro: Alert, answering all questions appropriately. Cranial nerves grossly intact. Skin: No rashes or petechiae noted. Psych: Normal affect.  LAB RESULTS:  Lab Results  Component Value Date   NA 138 06/02/2018   K 4.1 06/02/2018   CL 108 06/02/2018   CO2 25 06/02/2018   GLUCOSE 88 06/02/2018   BUN 8 06/02/2018   CREATININE 0.69 06/02/2018   CALCIUM 8.5 (L) 06/02/2018   PROT 7.6 06/02/2018   ALBUMIN 4.1 06/02/2018   AST 24 06/02/2018   ALT 19 06/02/2018   ALKPHOS 88 06/02/2018   BILITOT 0.4 06/02/2018   GFRNONAA >60 06/02/2018   GFRAA >60 06/02/2018    Lab Results  Component Value Date   WBC 2.3 (L) 06/02/2018   NEUTROABS 1.4 (L) 06/02/2018   HGB 9.8 (L) 06/02/2018   HCT 30.2 (L) 06/02/2018   MCV 95.0 06/02/2018   PLT 174 06/02/2018     STUDIES: No results found.  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     PLAN:    1. Progressive stage IV  adenocarcinoma of the lung with metastatic disease in bones and liver: PET scan results from March 20, 2018 reviewed independently with improvement of lung disease, but patient has new hypermetabolic lesions in her bones, liver, left adrenal gland, and right retroperitoneum consistent with progressive metastatic disease.  OmniSeq testing did not reveal any actionable mutations.  Patient wishes to continue with palliative treatment.  Proceed with cycle 4 of carboplatinum, pemetrexed, and Avastin.  Patient also receive Zometa on the odd-numbered cycles. She will require B12 injections and folate supplement along with her pemetrexed.  Return to clinic in 3 weeks for further evaluation and consideration of cycle 5.  Will reimage prior to next treatment and if patient has improvement of disease, can consider maintenance pemetrexed and Avastin.   2.  Leukopenia: Chronic and unchanged.  Patient may require Neulasta in the near future.  Proceed with treatment as above. 3.  Anemia: Patient's  hemoglobin is slowly trending down and is now 9.8.  Monitor. 4.  Pain: Improved with XRT.  Continue current narcotic regimen. 5.  Palliative care: Appreciate input. 6.  Left shoulder mobility: Patient to be evaluated by rehab.    Patient expressed understanding and was in agreement with this plan. She also understands that She can call clinic at any time with any questions, concerns, or complaints.   Cancer Staging Cancer of upper lobe of right lung [facility]) Staging form: Lung, AJCC 8th Edition - Clinical stage from 08/24/2017: Stage IV (cT2a, cN3, cM1c) - Signed by Lloyd Huger, MD on 03/29/2018   Lloyd Huger, MD   06/04/2018 7:00 AM

## 2018-06-02 ENCOUNTER — Inpatient Hospital Stay (HOSPITAL_BASED_OUTPATIENT_CLINIC_OR_DEPARTMENT_OTHER): Payer: Managed Care, Other (non HMO) | Admitting: Hospice and Palliative Medicine

## 2018-06-02 ENCOUNTER — Encounter: Payer: Self-pay | Admitting: Oncology

## 2018-06-02 ENCOUNTER — Inpatient Hospital Stay: Payer: Managed Care, Other (non HMO)

## 2018-06-02 ENCOUNTER — Inpatient Hospital Stay (HOSPITAL_BASED_OUTPATIENT_CLINIC_OR_DEPARTMENT_OTHER): Payer: Managed Care, Other (non HMO) | Admitting: Oncology

## 2018-06-02 ENCOUNTER — Other Ambulatory Visit: Payer: Self-pay

## 2018-06-02 VITALS — BP 132/81 | HR 77 | Temp 98.0°F | Resp 12 | Ht 60.0 in

## 2018-06-02 DIAGNOSIS — C3411 Malignant neoplasm of upper lobe, right bronchus or lung: Secondary | ICD-10-CM | POA: Diagnosis not present

## 2018-06-02 DIAGNOSIS — Z87891 Personal history of nicotine dependence: Secondary | ICD-10-CM

## 2018-06-02 DIAGNOSIS — E785 Hyperlipidemia, unspecified: Secondary | ICD-10-CM

## 2018-06-02 DIAGNOSIS — R5381 Other malaise: Secondary | ICD-10-CM

## 2018-06-02 DIAGNOSIS — M1990 Unspecified osteoarthritis, unspecified site: Secondary | ICD-10-CM

## 2018-06-02 DIAGNOSIS — E039 Hypothyroidism, unspecified: Secondary | ICD-10-CM

## 2018-06-02 DIAGNOSIS — C7951 Secondary malignant neoplasm of bone: Secondary | ICD-10-CM | POA: Diagnosis not present

## 2018-06-02 DIAGNOSIS — C787 Secondary malignant neoplasm of liver and intrahepatic bile duct: Secondary | ICD-10-CM

## 2018-06-02 DIAGNOSIS — Z79899 Other long term (current) drug therapy: Secondary | ICD-10-CM

## 2018-06-02 DIAGNOSIS — Z923 Personal history of irradiation: Secondary | ICD-10-CM

## 2018-06-02 DIAGNOSIS — Z515 Encounter for palliative care: Secondary | ICD-10-CM

## 2018-06-02 DIAGNOSIS — R531 Weakness: Secondary | ICD-10-CM

## 2018-06-02 DIAGNOSIS — Z7189 Other specified counseling: Secondary | ICD-10-CM

## 2018-06-02 DIAGNOSIS — G893 Neoplasm related pain (acute) (chronic): Secondary | ICD-10-CM

## 2018-06-02 DIAGNOSIS — I10 Essential (primary) hypertension: Secondary | ICD-10-CM

## 2018-06-02 DIAGNOSIS — D649 Anemia, unspecified: Secondary | ICD-10-CM

## 2018-06-02 DIAGNOSIS — Z803 Family history of malignant neoplasm of breast: Secondary | ICD-10-CM

## 2018-06-02 DIAGNOSIS — D72819 Decreased white blood cell count, unspecified: Secondary | ICD-10-CM

## 2018-06-02 DIAGNOSIS — R5383 Other fatigue: Secondary | ICD-10-CM

## 2018-06-02 LAB — COMPREHENSIVE METABOLIC PANEL
ALK PHOS: 88 U/L (ref 38–126)
ALT: 19 U/L (ref 0–44)
AST: 24 U/L (ref 15–41)
Albumin: 4.1 g/dL (ref 3.5–5.0)
Anion gap: 5 (ref 5–15)
BUN: 8 mg/dL (ref 8–23)
CALCIUM: 8.5 mg/dL — AB (ref 8.9–10.3)
CO2: 25 mmol/L (ref 22–32)
CREATININE: 0.69 mg/dL (ref 0.44–1.00)
Chloride: 108 mmol/L (ref 98–111)
GFR calc Af Amer: >60 mL/min (ref >60)
Glucose, Bld: 88 mg/dL (ref 70–99)
Potassium: 4.1 mmol/L (ref 3.5–5.1)
Sodium: 138 mmol/L (ref 135–145)
Total Bilirubin: 0.4 mg/dL (ref 0.3–1.2)
Total Protein: 7.6 g/dL (ref 6.5–8.1)

## 2018-06-02 LAB — CBC WITH DIFFERENTIAL/PLATELET
ABS IMMATURE GRANULOCYTES: 0.02 10*3/uL (ref 0.00–0.07)
Basophils Absolute: 0 10*3/uL (ref 0.0–0.1)
Basophils Relative: 0 %
Eosinophils Absolute: 0 10*3/uL (ref 0.0–0.5)
Eosinophils Relative: 0 %
HEMATOCRIT: 30.2 % — AB (ref 36.0–46.0)
HEMOGLOBIN: 9.8 g/dL — AB (ref 12.0–15.0)
Immature Granulocytes: 1 %
LYMPHS PCT: 18 %
Lymphs Abs: 0.4 10*3/uL — ABNORMAL LOW (ref 0.7–4.0)
MCH: 30.8 pg (ref 26.0–34.0)
MCHC: 32.5 g/dL (ref 30.0–36.0)
MCV: 95 fL (ref 80.0–100.0)
MONO ABS: 0.5 10*3/uL (ref 0.1–1.0)
MONOS PCT: 20 %
NEUTROS ABS: 1.4 10*3/uL — AB (ref 1.7–7.7)
Neutrophils Relative %: 61 %
Platelets: 174 10*3/uL (ref 150–400)
RBC: 3.18 MIL/uL — ABNORMAL LOW (ref 3.87–5.11)
RDW: 15.3 % (ref 11.5–15.5)
WBC: 2.3 10*3/uL — ABNORMAL LOW (ref 4.0–10.5)
nRBC: 0 % (ref 0.0–0.2)

## 2018-06-02 NOTE — Progress Notes (Signed)
Patient here for follow up no changes since last appointment 

## 2018-06-02 NOTE — Progress Notes (Signed)
[place]  [phone] [phone]   Name: Janet [name] Date: 06/02/2018 MRN: [id number]  DOB: 1955/11/04  [name], [name] [name], RN as Registered Nurse    REASON FOR CONSULTATION: Palliative Care consult requested for this 62 y.o. female with multiple medical problems including age for adenocarcinoma of the lung with metastases to bones, liver, left adrenal, and right retroperitoneum who is on chemotherapy and status post XRT.  Palliative care was asked to help support patient through treatment and address goals of care.   SOCIAL HISTORY:    Patient is married but separated.  Patient lives alone.  She has a son and daughter.  Patient used to work on Designer, television/film set.  ADVANCE DIRECTIVES:  Does not have  CODE STATUS: Full code  PAST MEDICAL HISTORY: Past Medical History:  Diagnosis Date  . Arthritis    right shoulder  . Cancer of right lung ([place]) 08/2017   Chemo + rad tx's.   Marland Kitchen Hyperlipidemia   . Hypertension   . Hypothyroidism   . Menopausal state   . Thyroid disease     PAST SURGICAL HISTORY:  Past Surgical History:  Procedure Laterality Date  . ABDOMINAL HYSTERECTOMY  2005  . BREAST EXCISIONAL BIOPSY Right 1979  . COLONOSCOPY WITH PROPOFOL N/A 05/13/2017   Procedure: COLONOSCOPY WITH PROPOFOL;  Surgeon: [name], MD;  Location[facility] ENDOSCOPY;  Service: Gastroenterology;  Laterality: N/A;  . CYSTECTOMY Right    breast  . CYSTECTOMY  02/2015   back of neck  . ESOPHAGOGASTRODUODENOSCOPY (EGD) WITH PROPOFOL N/A 05/13/2017   Procedure: ESOPHAGOGASTRODUODENOSCOPY (EGD) WITH PROPOFOL;  Surgeon: [name], MD;  Location: [facility] ENDOSCOPY;  Service: Gastroenterology;  Laterality: N/A;  . PORTA CATH INSERTION N/A 09/01/2017   Procedure: PORTA CATH INSERTION;  Surgeon: Algernon Huxley, MD;  Location: [place] CV LAB;  Service:  Cardiovascular;  Laterality: N/A;  . TUBAL LIGATION  1986    HEMATOLOGY/ONCOLOGY HISTORY:    Cancer of upper lobe of right lung ([place])   07/09/2017 Initial Diagnosis    Cancer of upper lobe of right lung ([place])    03/23/2018 -  Chemotherapy    The patient had palonosetron (ALOXI) injection 0.25 mg, 0.25 mg, Intravenous,  Once, 3 of 6 cycles Administration: 0.25 mg (04/01/2018), 0.25 mg (04/22/2018), 0.25 mg (05/13/2018) bevacizumab (AVASTIN) 900 mg in sodium chloride 0.9 % 100 mL chemo infusion, 925 mg, Intravenous,  Once, 3 of 6 cycles Administration: 900 mg (04/01/2018), 900 mg (04/22/2018) PEMEtrexed (ALIMTA) 800 mg in sodium chloride 0.9 % 100 mL chemo infusion, 800 mg, Intravenous,  Once, 3 of 6 cycles Administration: 800 mg (04/01/2018), 800 mg (04/22/2018), 800 mg (05/13/2018) CARBOplatin (PARAPLATIN) 480 mg in sodium chloride 0.9 % 250 mL chemo infusion, 480 mg (100 % of original dose 480.5 mg), Intravenous,  Once, 3 of 6 cycles Dose modification:   (original dose 480.5 mg, Cycle 1) Administration: 480 mg (04/01/2018), 480 mg (04/22/2018), 480 mg (05/13/2018) fosaprepitant (EMEND) 150 mg, dexamethasone (DECADRON) 12 mg in sodium chloride 0.9 % 145 mL IVPB, , Intravenous,  Once, 3 of 6 cycles Administration:  (04/01/2018),  (04/22/2018),  (05/13/2018)  for chemotherapy treatment.      ALLERGIES:  has No Known Allergies.  MEDICATIONS:  Current Outpatient Medications  Medication Sig Dispense Refill  . amLODipine (NORVASC) 5 MG tablet Take 1 tablet (5 mg total) by mouth daily. 90 tablet 1  .  atorvastatin (LIPITOR) 10 MG tablet TAKE 1 TABLET DAILY AT 6 P.M. 90 tablet 1  . folic acid (FOLVITE) 1 MG tablet Take 1 tablet (1 mg total) by mouth daily. 90 tablet 2  . levothyroxine (SYNTHROID, LEVOTHROID) 50 MCG tablet TAKE 1 TABLET DAILY 90 tablet 3  . lidocaine-prilocaine (EMLA) cream APP EXT AA 1 TIME  3  . Oxycodone HCl 10 MG TABS Take 1 tablet (10 mg total) by mouth every 6 (six) hours as needed.  120 tablet 0   No current facility-administered medications for this visit.     VITAL SIGNS: LMP 08/08/2003 (Approximate) Comment: Hysterectomy 2004 There were no vitals filed for this visit.  Estimated body mass index is 23.14 kg/m as calculated from the following:   Height as of an earlier encounter on 06/02/18: 5' (1.524 m).   Weight as of 05/13/18: 118 lb 8 oz (53.8 kg).  LABS: CBC:    Component Value Date/Time   WBC 2.3 (L) 06/02/2018 1051   HGB 9.8 (L) 06/02/2018 1051   HGB 10.8 (L) 07/09/2017 1615   HCT 30.2 (L) 06/02/2018 1051   HCT 32.7 (L) 07/09/2017 1615   PLT 174 06/02/2018 1051   PLT 394 (H) 07/09/2017 1615   MCV 95.0 06/02/2018 1051   MCV 92 07/09/2017 1615   NEUTROABS 1.4 (L) 06/02/2018 1051   NEUTROABS 2.5 07/09/2017 1615   LYMPHSABS 0.4 (L) 06/02/2018 1051   LYMPHSABS 3.1 07/09/2017 1615   MONOABS 0.5 06/02/2018 1051   EOSABS 0.0 06/02/2018 1051   EOSABS 0.1 07/09/2017 1615   BASOSABS 0.0 06/02/2018 1051   BASOSABS 0.0 07/09/2017 1615   Comprehensive Metabolic Panel:    Component Value Date/Time   NA 138 06/02/2018 1051   NA 147 (H) 07/09/2017 1615   K 4.1 06/02/2018 1051   CL 108 06/02/2018 1051   CO2 25 06/02/2018 1051   BUN 8 06/02/2018 1051   BUN 8 07/09/2017 1615   CREATININE 0.69 06/02/2018 1051   GLUCOSE 88 06/02/2018 1051   CALCIUM 8.5 (L) 06/02/2018 1051   AST 24 06/02/2018 1051   ALT 19 06/02/2018 1051   ALKPHOS 88 06/02/2018 1051   BILITOT 0.4 06/02/2018 1051   BILITOT <0.2 07/09/2017 1615   PROT 7.6 06/02/2018 1051   PROT 6.6 07/09/2017 1615   ALBUMIN 4.1 06/02/2018 1051   ALBUMIN 4.3 07/09/2017 1615    RADIOGRAPHIC STUDIES: No results found.  PERFORMANCE STATUS (ECOG) : 1 - Symptomatic but completely ambulatory  Review of Systems As noted above. Otherwise, a complete review of systems is negative.  Physical Exam General: NAD, frail appearing, thin Cardiovascular: regular rate and rhythm Pulmonary: clear ant  fields Abdomen: soft, nontender, + bowel sounds GU: no suprapubic tenderness Extremities: no edema, no joint deformities Skin: no rashes Neurological: Weakness but otherwise nonfocal  IMPRESSION: I met with patient today in the clinic to discuss goals.  Overall, she says she is doing well with treatment.  She recognizes that treatment is palliative in nature.  However, she remains hopeful and optimistic for disease improvement.  At baseline, patient lives at home alone.  She still drives and is functionally independent with all activities of daily living.  She denies falls and feels safe in the home.  Symptomatically, patient has some discomfort in the left shoulder.  She is being referred to OT for evaluation.  Last imaging on PET/CT from 8/16 showed widely metastatic disease to the axial skeleton but no involvement in the left shoulder or cervical spine.  Patient will likely be reimaged following completion of her chemotherapy.  However, should pain or weakness intensify, consideration could be given to reimaging shoulder and/or cervical spine.  For analgesia, patient has oxycodone, which reportedly is efficacious.   We discussed patient's coping with her disease and illness.  She seems somewhat tearful at times but denies depression.  Patient says she has felt somewhat down in the past when she has not been able to eat due to a change in taste.  However, she says she is now eating better and feels moods are subsequently improved.  We discussed advance directives.  I reviewed with patient documents for establishing a healthcare power of attorney and living will.  Patient is married but separated.  She recognizes that in the absence of legal divorce or healthcare power of attorney, her husband would be the medical decision maker in the event that she was unable to make her own decisions.  Patient would want her son or daughter to be her decision-maker if needed.  Patient is interested in  establishing healthcare power of attorney documents.  Patient says she has given some consideration to end-of-life decision-making but has not arrived at any definite decisions.  She also says she has not talked with her children about her end-of-life goals or decision-making.  Patient is not sure if she would want to be resuscitated.  We discussed a MOST Form and will complete that if patient is agreeable at time of next visit.  PLAN: OT referral Continue oxycodone as needed for pain Consider imaging of left shoulder and/or cervical spine if pain or weakness intensifies Recommend completion of healthcare power of attorney and living will Will discuss MOST Form at time of next visit RTC in 2 to 3 weeks  Patient expressed understanding and was in agreement with this plan. She also understands that She can call clinic at any time with any questions, concerns, or complaints.    Time Total: 20 minutes  Visit consisted of counseling and education dealing with the complex and emotionally intense issues of symptom management and palliative care in the setting of serious and potentially life-threatening illness.Greater than 50%  of this time was spent counseling and coordinating care related to the above assessment and plan.  Signed by: Altha Harm, [place], NP-C, [place] (Work Cell)

## 2018-06-03 ENCOUNTER — Inpatient Hospital Stay: Payer: Managed Care, Other (non HMO)

## 2018-06-03 ENCOUNTER — Inpatient Hospital Stay: Payer: Managed Care, Other (non HMO) | Admitting: Occupational Therapy

## 2018-06-03 ENCOUNTER — Other Ambulatory Visit: Payer: Self-pay | Admitting: Oncology

## 2018-06-03 VITALS — BP 135/75 | HR 72 | Temp 97.1°F | Wt 118.9 lb

## 2018-06-03 DIAGNOSIS — C3490 Malignant neoplasm of unspecified part of unspecified bronchus or lung: Secondary | ICD-10-CM

## 2018-06-03 DIAGNOSIS — M25512 Pain in left shoulder: Secondary | ICD-10-CM

## 2018-06-03 DIAGNOSIS — M25612 Stiffness of left shoulder, not elsewhere classified: Secondary | ICD-10-CM

## 2018-06-03 DIAGNOSIS — C3411 Malignant neoplasm of upper lobe, right bronchus or lung: Secondary | ICD-10-CM | POA: Diagnosis not present

## 2018-06-03 LAB — PROTEIN, URINE, RANDOM

## 2018-06-03 MED ORDER — PALONOSETRON HCL INJECTION 0.25 MG/5ML
0.2500 mg | Freq: Once | INTRAVENOUS | Status: AC
  Administered 2018-06-03: 0.25 mg via INTRAVENOUS
  Filled 2018-06-03: qty 5

## 2018-06-03 MED ORDER — SODIUM CHLORIDE 0.9 % IV SOLN
Freq: Once | INTRAVENOUS | Status: AC
  Administered 2018-06-03: 10:00:00 via INTRAVENOUS
  Filled 2018-06-03: qty 250

## 2018-06-03 MED ORDER — CYANOCOBALAMIN 1000 MCG/ML IJ SOLN
1000.0000 ug | Freq: Once | INTRAMUSCULAR | Status: AC
  Administered 2018-06-03: 1000 ug via INTRAMUSCULAR
  Filled 2018-06-03: qty 1

## 2018-06-03 MED ORDER — HEPARIN SOD (PORK) LOCK FLUSH 100 UNIT/ML IV SOLN
500.0000 [IU] | Freq: Once | INTRAVENOUS | Status: AC | PRN
  Administered 2018-06-03: 500 [IU]

## 2018-06-03 MED ORDER — SODIUM CHLORIDE 0.9 % IV SOLN
Freq: Once | INTRAVENOUS | Status: AC
  Administered 2018-06-03: 10:00:00 via INTRAVENOUS
  Filled 2018-06-03: qty 5

## 2018-06-03 MED ORDER — SODIUM CHLORIDE 0.9 % IV SOLN
400.0000 mg | Freq: Once | INTRAVENOUS | Status: AC
  Administered 2018-06-03: 400 mg via INTRAVENOUS
  Filled 2018-06-03: qty 40

## 2018-06-03 MED ORDER — SODIUM CHLORIDE 0.9 % IV SOLN
500.0000 mg/m2 | Freq: Once | INTRAVENOUS | Status: AC
  Administered 2018-06-03: 800 mg via INTRAVENOUS
  Filled 2018-06-03: qty 20

## 2018-06-03 MED ORDER — SODIUM CHLORIDE 0.9 % IV SOLN
800.0000 mg | Freq: Once | INTRAVENOUS | Status: AC
  Administered 2018-06-03: 800 mg via INTRAVENOUS
  Filled 2018-06-03: qty 32

## 2018-06-03 NOTE — Therapy (Signed)
[facility] [address] [place], [place], [place], [address] Phone: [phone]   Fax:  [phone]  Occupational Therapy screen  Patient Details  Name: Janet [name] MRN: [id number] Date of Birth: 1956/02/05 No data recorded  Encounter Date: 06/03/2018    Past Medical History:  Diagnosis Date  . Arthritis    right shoulder  . Cancer of right lung ([place]) 08/2017   Chemo + rad tx's.   Marland Kitchen Hyperlipidemia   . Hypertension   . Hypothyroidism   . Menopausal state   . Thyroid disease     Past Surgical History:  Procedure Laterality Date  . ABDOMINAL HYSTERECTOMY  2005  . BREAST EXCISIONAL BIOPSY Right 1979  . COLONOSCOPY WITH PROPOFOL N/A 05/13/2017   Procedure: COLONOSCOPY WITH PROPOFOL;  Surgeon: [name], MD;  Location[facility] ENDOSCOPY;  Service: Gastroenterology;  Laterality: N/A;  . CYSTECTOMY Right    breast  . CYSTECTOMY  02/2015   back of neck  . ESOPHAGOGASTRODUODENOSCOPY (EGD) WITH PROPOFOL N/A 05/13/2017   Procedure: ESOPHAGOGASTRODUODENOSCOPY (EGD) WITH PROPOFOL;  Surgeon: [name], MD;  Location: [facility] ENDOSCOPY;  Service: Gastroenterology;  Laterality: N/A;  . PORTA CATH INSERTION N/A 09/01/2017   Procedure: PORTA CATH INSERTION;  Surgeon: Algernon Huxley, MD;  Location: [place] CV LAB;  Service: Cardiovascular;  Laterality: N/A;  . TUBAL LIGATION  1986    There were no vitals filed for this visit.  Subjective Assessment - 06/03/18 1727    Subjective   I was still okay in the summer  and still worked at my assembly job - but then since Sept when I had my radiation I notice that I had harder time lifting my arm on the L , stiff feeling and pain - at the worse about 4/10 pain with range of motion and laying on it     Patient Stated Goals  I would like my motion and pain to get better in my L shoulder so I can reach into cabinets, fasten my bra, pull clothing over my head  and sleeping on my arm     Currently in Pain?  Yes    Pain Score  4     Pain Location  Shoulder    Pain Orientation  Left    Pain Descriptors / Indicators  Aching;Tightness    Pain Type  Acute pain    Pain Onset  More than a month ago    Aggravating Factors   With using it or reaching          Pt 62 y.o. female with multiple medical problems including age for adenocarcinoma of the lung with metastases to bones, liver, left adrenal, and right retroperitoneum who is on chemotherapy and status post XRT.   Pt did had palliative radiation therapy to her left iliac crest and  left acromion process.    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  Having hard time sleeping on L side , reaching over head in cabinets , pulling clothing over head and fastening her bra    Pt at rest has no pain - tender over acromion process - during stabilization for scapula mobs  Posture and  cervical AROM in all planes WNL   AROM for shoulder flexion R 150 , L 102 degrees ABD R 148 and L 106 degrees  Extention R 70 and L 44  all range of motion pain  4/10   Pt had less to no pain with AAROM and PROM by OT  Scapula mobs done prior to [facility] by OT   pt report felt good   Pt was provided with Pulley's  To use at home for [facility] and PROM:  To do  Shoulder flexion and ABD - 20 reps  pain free 2 x day Shoulder extention AROM - 10 reps  2 x day  And scapula squeezes 10 reps  4 x day      Will check on pt in week again                             Patient will benefit from skilled therapeutic intervention in order to improve the following deficits and impairments:     Visit Diagnosis: Acute pain of left shoulder  Stiffness of left shoulder, not elsewhere  classified    Problem List Patient Active Problem List   Diagnosis Date Noted  . Goals of care, counseling/discussion 08/25/2017  . Cancer of upper lobe of right lung ([place]) 07/09/2017  . Abnormal weight loss 04/04/2017  . GERD (gastroesophageal reflux disease) 04/04/2017  . Hypertension 08/04/2015  . Hypothyroidism 08/04/2015  . Menopausal symptom 08/04/2015  . Hyperlipidemia 08/04/2015  . Osteoma 01/16/2015    Janet [name] OTR/L,CLT 06/03/2018, 5:32 PM  [facility] [address] [place], [place], [place], [address] Phone: [phone]   Fax:  [phone]  Name: Janet [name] MRN: [id number] Date of Birth: Nov 21, 1955

## 2018-06-08 ENCOUNTER — Ambulatory Visit: Payer: Managed Care, Other (non HMO) | Admitting: Radiation Oncology

## 2018-06-10 ENCOUNTER — Ambulatory Visit
Admission: RE | Admit: 2018-06-10 | Discharge: 2018-06-10 | Disposition: A | Discharge status: Home / Self Care | Payer: Managed Care, Other (non HMO) | Source: Ambulatory Visit | Attending: Radiation Oncology | Admitting: Radiation Oncology

## 2018-06-10 ENCOUNTER — Other Ambulatory Visit: Payer: Self-pay

## 2018-06-10 ENCOUNTER — Encounter: Payer: Self-pay | Admitting: Radiation Oncology

## 2018-06-10 ENCOUNTER — Inpatient Hospital Stay: Payer: Managed Care, Other (non HMO) | Attending: Radiation Oncology | Admitting: Occupational Therapy

## 2018-06-10 DIAGNOSIS — R1030 Lower abdominal pain, unspecified: Secondary | ICD-10-CM | POA: Insufficient documentation

## 2018-06-10 DIAGNOSIS — T451X5S Adverse effect of antineoplastic and immunosuppressive drugs, sequela: Secondary | ICD-10-CM | POA: Insufficient documentation

## 2018-06-10 DIAGNOSIS — Z923 Personal history of irradiation: Secondary | ICD-10-CM | POA: Insufficient documentation

## 2018-06-10 DIAGNOSIS — M1990 Unspecified osteoarthritis, unspecified site: Secondary | ICD-10-CM | POA: Insufficient documentation

## 2018-06-10 DIAGNOSIS — F419 Anxiety disorder, unspecified: Secondary | ICD-10-CM | POA: Insufficient documentation

## 2018-06-10 DIAGNOSIS — E785 Hyperlipidemia, unspecified: Secondary | ICD-10-CM | POA: Insufficient documentation

## 2018-06-10 DIAGNOSIS — M25612 Stiffness of left shoulder, not elsewhere classified: Secondary | ICD-10-CM

## 2018-06-10 DIAGNOSIS — D649 Anemia, unspecified: Secondary | ICD-10-CM | POA: Insufficient documentation

## 2018-06-10 DIAGNOSIS — Z79899 Other long term (current) drug therapy: Secondary | ICD-10-CM | POA: Insufficient documentation

## 2018-06-10 DIAGNOSIS — Z5112 Encounter for antineoplastic immunotherapy: Secondary | ICD-10-CM | POA: Insufficient documentation

## 2018-06-10 DIAGNOSIS — R531 Weakness: Secondary | ICD-10-CM | POA: Insufficient documentation

## 2018-06-10 DIAGNOSIS — C7951 Secondary malignant neoplasm of bone: Secondary | ICD-10-CM

## 2018-06-10 DIAGNOSIS — R5381 Other malaise: Secondary | ICD-10-CM | POA: Insufficient documentation

## 2018-06-10 DIAGNOSIS — Z780 Asymptomatic menopausal state: Secondary | ICD-10-CM | POA: Insufficient documentation

## 2018-06-10 DIAGNOSIS — Z87891 Personal history of nicotine dependence: Secondary | ICD-10-CM | POA: Insufficient documentation

## 2018-06-10 DIAGNOSIS — R5383 Other fatigue: Secondary | ICD-10-CM | POA: Insufficient documentation

## 2018-06-10 DIAGNOSIS — M25512 Pain in left shoulder: Secondary | ICD-10-CM

## 2018-06-10 DIAGNOSIS — I10 Essential (primary) hypertension: Secondary | ICD-10-CM | POA: Insufficient documentation

## 2018-06-10 DIAGNOSIS — C3411 Malignant neoplasm of upper lobe, right bronchus or lung: Secondary | ICD-10-CM | POA: Diagnosis not present

## 2018-06-10 DIAGNOSIS — D701 Agranulocytosis secondary to cancer chemotherapy: Secondary | ICD-10-CM | POA: Insufficient documentation

## 2018-06-10 DIAGNOSIS — Z5111 Encounter for antineoplastic chemotherapy: Secondary | ICD-10-CM | POA: Insufficient documentation

## 2018-06-10 DIAGNOSIS — Z515 Encounter for palliative care: Secondary | ICD-10-CM | POA: Insufficient documentation

## 2018-06-10 DIAGNOSIS — E039 Hypothyroidism, unspecified: Secondary | ICD-10-CM | POA: Insufficient documentation

## 2018-06-10 DIAGNOSIS — C787 Secondary malignant neoplasm of liver and intrahepatic bile duct: Secondary | ICD-10-CM | POA: Insufficient documentation

## 2018-06-10 NOTE — Progress Notes (Signed)
Radiation Oncology Follow up Note  Name: Janet [name]   Date:   06/10/2018 MRN:  [id number] DOB: 24-Nov-1955    This 62 y.o. female presents to the clinic today for one-month follow-up status post palliative treatment.to left iliac crest and left shoulder or stage IVadenocarcinoma the right upper lobe  REFERRING PROVIDER: Kathrine Haddock, NP  HPI: patient is in 62 year old female now 1 month out having received palliative radiation therapy to her iliac crest as well as her left shoulder for stage IV adenocarcinoma the right upper lobe. Seen today in routine follow-up she is doing well she states the pain is mostly resolved. She's having no pain in her pelvis some slight stiffness in her left shoulder although range of motion is good..she is undergoing occupational therapy at this time.she is currently received palliative carboplatinum and a Avastin.  COMPLICATIONS OF TREATMENT: none  FOLLOW UP COMPLIANCE: keeps appointments   PHYSICAL EXAM:  BP (P) 121/76 (BP Location: Left Arm, Patient Position: Sitting)   Pulse (P) 81   Temp (!) (P) 95.8 F (35.4 C) (Tympanic)   Wt (P) 118 lb 11.5 oz (53.8 kg)   LMP 08/08/2003 (Approximate) Comment: Hysterectomy 2004  BMI (P) 23.19 kg/m  Range of motion of her left shoulder shows good motor strength no pain is elicited on range of motion. Range of motion of her lower extremities shows no evidence of pain. Motor and sensory levels are equal and symmetric.Well-developed well-nourished patient in NAD. HEENT reveals PERLA, EOMI, discs not visualized.  Oral cavity is clear. No oral mucosal lesions are identified. Neck is clear without evidence of cervical or supraclavicular adenopathy. Lungs are clear to A&P. Cardiac examination is essentially unremarkable with regular rate and rhythm without murmur rub or thrill. Abdomen is benign with no organomegaly or masses noted. Motor sensory and DTR levels are equal and symmetric in the upper and lower extremities.  Cranial nerves II through XII are grossly intact. Proprioception is intact. No peripheral adenopathy or edema is identified. No motor or sensory levels are noted. Crude visual fields are within normal range.  RADIOLOGY RESULTS: no current films for review  PLAN: present time patient is under good palliative control. I'll turn follow-up care over to medical oncology. I would be happy to reevaluate the patient anytime the future should further palliative radiation therapy be indicated.  I would like to take this opportunity to thank you for allowing me to participate in the care of your patient.Noreene Filbert, MD

## 2018-06-10 NOTE — Therapy (Signed)
[facility] [address] [place], [place], [place], [address] Phone: [phone]   Fax:  [phone]  Occupational Therapy Treatment  Patient Details  Name: Janet [name] MRN: [id number] Date of Birth: 1955/09/24 No data recorded  Encounter Date: 06/10/2018    Past Medical History:  Diagnosis Date  . Arthritis    right shoulder  . Cancer of right lung ([place]) 08/2017   Chemo + rad tx's.   Marland Kitchen Hyperlipidemia   . Hypertension   . Hypothyroidism   . Menopausal state   . Thyroid disease     Past Surgical History:  Procedure Laterality Date  . ABDOMINAL HYSTERECTOMY  2005  . BREAST EXCISIONAL BIOPSY Right 1979  . COLONOSCOPY WITH PROPOFOL N/A 05/13/2017   Procedure: COLONOSCOPY WITH PROPOFOL;  Surgeon: [name], MD;  Location: [facility] ENDOSCOPY;  Service: Gastroenterology;  Laterality: N/A;  . CYSTECTOMY Right    breast  . CYSTECTOMY  02/2015   back of neck  . ESOPHAGOGASTRODUODENOSCOPY (EGD) WITH PROPOFOL N/A 05/13/2017   Procedure: ESOPHAGOGASTRODUODENOSCOPY (EGD) WITH PROPOFOL;  Surgeon: [name], MD;  Location: [facility] ENDOSCOPY;  Service: Gastroenterology;  Laterality: N/A;  . PORTA CATH INSERTION N/A 09/01/2017   Procedure: PORTA CATH INSERTION;  Surgeon: Algernon Huxley, MD;  Location: [place] CV LAB;  Service: Cardiovascular;  Laterality: N/A;  . TUBAL LIGATION  1986    There were no vitals filed for this visit.  Subjective Assessment - 06/10/18 1133    Subjective   Shoulder and pain about the same- I forgot to tell you I use to had issues with my L hip but that is better now     Patient Stated Goals  I would like my motion and pain to get better in my L shoulder so I can reach into cabinets, fasten my bra, pull clothing over my head  and sleeping on my arm     Currently in Pain?  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   Pt did had palliative radiation therapy to her left iliac crest and  left acromion process.   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  Having hard time sleeping on L side , reaching over head in cabinets , pulling clothing over head and fastening her bra    Pt at rest has no pain - tender over acromion process - during stabilization for scapula mobs  Posture and cervical AROM in all planes WNL     This session pt report she is able to sleep on the L side now little better  and ROM increase in extention and ABD of L shoulder    AROM for shoulder flexion R 150 ,  L 102 degrees ( now 95)  ABD R 148 and L 106 degrees ( now 110)  Extention R 70 and L 44( now 52)  all range of motion pain  4/10   Pt had less to no pain with AAROM and PROM by OT   This date change HEP to AROM and AAROM over head in supine   pt report she did not like the pulley exercises provided last week   Pt to use wand in supine   - over head Shoulder flexion  Overhead out to 45 degrees ABD  External rotation over head  10-15 reps pain free   AROM D1 and D2 patterns  10-15 degrees  pain free And scapula protraction in supine  10 reps  no pain   Pt to do for 2 wks   2 x day - pain free can increase to 15 reps   2 sets    And scapula squeezes 10 reps  4 x day      Will check on pt in  2 weeks again                                         Patient will benefit from skilled therapeutic intervention in order to improve the following deficits and impairments:     Visit Diagnosis: Acute pain of left shoulder  Stiffness of left shoulder, not elsewhere classified    Problem List Patient Active Problem List   Diagnosis Date Noted  . Goals of care, counseling/discussion 08/25/2017  . Cancer of upper lobe of right lung ([place]) 07/09/2017  . Abnormal weight loss 04/04/2017  . GERD (gastroesophageal reflux disease) 04/04/2017  . Hypertension 08/04/2015  . Hypothyroidism 08/04/2015  . Menopausal symptom 08/04/2015  . Hyperlipidemia 08/04/2015  . Osteoma 01/16/2015    Rosalyn Gess  OTR/L,CLT 06/10/2018, 11:34 AM  [facility] [address] [place], [place], [place], [address] Phone: [phone]   Fax:  [phone]  Name: Janet [name] MRN: [id number] Date of Birth: September 11, 1955

## 2018-06-15 ENCOUNTER — Other Ambulatory Visit: Payer: Self-pay | Admitting: Unknown Physician Specialty

## 2018-06-15 NOTE — Telephone Encounter (Signed)
Requested medication (s) are due for refill today: Yes  Requested medication (s) are on the active medication list: Yes  Last refill:  05/13/17  Future visit scheduled: Yes  Notes to clinic:  Expired Rx, unable to refill.     Requested Prescriptions  Pending Prescriptions Disp Refills   atorvastatin (LIPITOR) 10 MG tablet [Pharmacy Med Name: ATORVASTATIN TABS 10MG ] 90 tablet 4    Sig: TAKE 1 TABLET DAILY AT 6 P.M.     Cardiovascular:  Antilipid - Statins Failed - 06/15/2018 10:54 AM      Failed - Total Cholesterol in normal range and within 360 days    Cholesterol, Total  Date Value Ref Range Status  04/04/2017 172 100 - 199 mg/dL Final   Cholesterol Piccolo, Waived  Date Value Ref Range Status  08/08/2015 138 <200 mg/dL Final    Comment:                            Desirable                <200                         Borderline High      200- 239                         High                     >239          Failed - LDL in normal range and within 360 days    LDL Calculated  Date Value Ref Range Status  04/04/2017 100 (H) 0 - 99 mg/dL Final         Failed - HDL in normal range and within 360 days    HDL  Date Value Ref Range Status  04/04/2017 55 >39 mg/dL Final         Failed - Triglycerides in normal range and within 360 days    Triglycerides  Date Value Ref Range Status  04/04/2017 84 0 - 149 mg/dL Final   Triglycerides Piccolo,Waived  Date Value Ref Range Status  08/08/2015 138 <150 mg/dL Final    Comment:                            Normal                   <150                         Borderline High     150 - 199                         High                200 - 499                         Very High                >499          Passed - Patient is not pregnant      Passed - Valid encounter within last 12 months    Recent Outpatient Visits  2 months ago Essential hypertension   [place], [employer], [place]   5  months ago Essential hypertension   [place], NP   10 months ago Abnormal weight loss   [place], NP   11 months ago Lung nodule < 6cm on [place] Kathrine Haddock, NP   1 year ago Annual physical exam   [facility] Kathrine Haddock, NP      Future Appointments            In 3 months Cannady, Barbaraann Faster, NP [employer], [facility]

## 2018-06-17 ENCOUNTER — Other Ambulatory Visit: Payer: Self-pay | Admitting: Family Medicine

## 2018-06-17 DIAGNOSIS — Z1231 Encounter for screening mammogram for malignant neoplasm of breast: Secondary | ICD-10-CM

## 2018-06-20 NOTE — Progress Notes (Signed)
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    INTERVAL HISTORY: Patient returns to clinic today for further evaluation and consideration of cycle 5 of carboplatinum, pemetrexed, and Avastin.  She did not have imaging since PET scan was denied by insurance.  She has some increased right flank pain, but otherwise feels well. She continues to have chronic weakness and fatigue.  She has no neurologic complaints.  She denies any recent fevers or illnesses. She denies any chest pain, shortness of breath, hemoptysis, or cough.  She has no nausea, vomiting, constipation, or diarrhea.  She has no melena or hematochezia.  She has no urinary complaints.  Patient offers no further specific complaints today.  REVIEW OF SYSTEMS:   Review of Systems  Constitutional: Positive for malaise/fatigue. Negative for fever and weight loss.  Respiratory: Negative.  Negative for cough and shortness of breath.   Cardiovascular: Negative.  Negative for chest pain and leg swelling.  Gastrointestinal: Negative.  Negative for abdominal pain, blood in stool and melena.  Genitourinary: Positive for flank pain. Negative for dysuria.  Musculoskeletal: Negative for back pain and joint pain.  Skin: Negative.  Negative for rash.  Neurological: Positive for weakness. Negative for sensory change, focal weakness and headaches.  Psychiatric/Behavioral: Negative.  The patient is not nervous/anxious.     As per HPI. Otherwise, a complete review of systems is negative.  PAST MEDICAL HISTORY: Past Medical History:  Diagnosis Date  . Arthritis    right shoulder  . Cancer of right lung ([place]) 08/2017   Chemo + rad tx's.   Marland Kitchen Hyperlipidemia   . Hypertension   . Hypothyroidism   . Menopausal state   . Thyroid disease     PAST SURGICAL HISTORY: Past Surgical History:  Procedure Laterality Date  . ABDOMINAL HYSTERECTOMY  2005  . BREAST EXCISIONAL BIOPSY Right 1979  . COLONOSCOPY WITH PROPOFOL N/A 05/13/2017   Procedure: COLONOSCOPY WITH PROPOFOL;  Surgeon: [name], MD;  Location: [facility] ENDOSCOPY;  Service: Gastroenterology;  Laterality: N/A;  . CYSTECTOMY Right    breast  . CYSTECTOMY  02/2015   back of neck  . ESOPHAGOGASTRODUODENOSCOPY (EGD) WITH PROPOFOL N/A 05/13/2017   Procedure: ESOPHAGOGASTRODUODENOSCOPY (EGD) WITH PROPOFOL;  Surgeon: [name], MD;  Location: [facility] ENDOSCOPY;  Service: Gastroenterology;  Laterality: N/A;  . PORTA CATH INSERTION N/A 09/01/2017   Procedure: PORTA CATH INSERTION;  Surgeon: Algernon Huxley, MD;  Location: [place] CV LAB;  Service: Cardiovascular;  Laterality: N/A;  . TUBAL LIGATION  1986    FAMILY HISTORY: Family History  Problem Relation Age of Onset  . Hypertension Mother   . Stroke Mother   . Leukemia Mother   . Stroke Father   . Pneumonia Father   . Diabetes Sister   . Hyperlipidemia Sister   . Breast cancer Maternal Aunt 63    ADVANCED DIRECTIVES (Y/N):  N  HEALTH MAINTENANCE: Social History   Tobacco Use  . Smoking status: Former Smoker    Packs/day: 0.25    Types: Cigarettes    Last attempt to quit: 02/01/2017    Years since quitting: 1.3  . Smokeless tobacco: Never Used  Substance Use Topics  . Alcohol use: No  Alcohol/week: 0.0 standard drinks  . Drug use: No     Colonoscopy:  PAP:  Bone density:  Lipid panel:  No Known Allergies  Current Outpatient Medications  Medication Sig Dispense Refill  . amLODipine (NORVASC) 5 MG tablet Take 1 tablet (5 mg total) by mouth daily. 90 tablet 1  . folic acid (FOLVITE) 1 MG tablet Take 1 tablet (1 mg total) by mouth daily. 90 tablet 2  . levothyroxine (SYNTHROID,  LEVOTHROID) 50 MCG tablet TAKE 1 TABLET DAILY 90 tablet 3  . lidocaine-prilocaine (EMLA) cream APP EXT AA 1 TIME  3  . atorvastatin (LIPITOR) 10 MG tablet TAKE 1 TABLET DAILY AT 6 P.M. (Patient not taking: Reported on 06/23/2018) 90 tablet 4  . Oxycodone HCl 10 MG TABS Take 1 tablet (10 mg total) by mouth every 6 (six) hours as needed. (Patient not taking: Reported on 06/23/2018) 120 tablet 0   No current facility-administered medications for this visit.    Facility-Administered Medications Ordered in Other Visits  Medication Dose Route Frequency Provider Last Rate Last Dose  . CARBOplatin (PARAPLATIN) 440 mg in sodium chloride 0.9 % 250 mL chemo infusion  440 mg Intravenous Once Lloyd Huger, MD 588 mL/hr at 06/23/18 1314 440 mg at 06/23/18 1314  . heparin lock flush 100 unit/mL  500 Units Intravenous Once Lloyd Huger, MD      . heparin lock flush 100 unit/mL  500 Units Intracatheter Once PRN Lloyd Huger, MD      . sodium chloride flush (NS) 0.9 % injection 10 mL  10 mL Intravenous PRN Lloyd Huger, MD   10 mL at 06/23/18 1020    OBJECTIVE: Vitals:   06/23/18 1040  BP: (!) 154/73  Pulse: 90  Resp: 18  Temp: (!) 97.3 F (36.3 C)     Body mass index is 23.69 kg/m.    ECOG FS:0 - Asymptomatic  General: Well-developed, well-nourished, no acute distress. Eyes: Pink conjunctiva, anicteric sclera. HEENT: Normocephalic, moist mucous membranes. Lungs: Clear to auscultation bilaterally. Heart: Regular rate and rhythm. No rubs, murmurs, or gallops. Abdomen: Soft, nontender, nondistended. No organomegaly noted, normoactive bowel sounds.  No tenderness to palpation of right flank. Musculoskeletal: No edema, cyanosis, or clubbing. Neuro: Alert, answering all questions appropriately. Cranial nerves grossly intact. Skin: No rashes or petechiae noted. Psych: Normal affect.  LAB RESULTS:  Lab Results  Component Value Date   NA 140 06/23/2018   K 3.7 06/23/2018     CL 108 06/23/2018   CO2 26 06/23/2018   GLUCOSE 90 06/23/2018   BUN 10 06/23/2018   CREATININE 0.72 06/23/2018   CALCIUM 8.8 (L) 06/23/2018   PROT 6.9 06/23/2018   ALBUMIN 3.8 06/23/2018   AST 27 06/23/2018   ALT 21 06/23/2018   ALKPHOS 81 06/23/2018   BILITOT 0.3 06/23/2018   GFRNONAA >60 06/23/2018   GFRAA >60 06/23/2018    Lab Results  Component Value Date   WBC 2.0 (L) 06/23/2018   NEUTROABS 1.1 (L) 06/23/2018   HGB 8.9 (L) 06/23/2018   HCT 27.5 (L) 06/23/2018   MCV 99.6 06/23/2018   PLT 190 06/23/2018     STUDIES: No results found.  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     PLAN:    1. 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  OmniSeq testing did not reveal any actionable mutations.  Patient wishes to continue with palliative treatment.  Restaging PET scan was denied by insurance, therefore we will proceed with cycle 5 of carboplatinum, pemetrexed, and Avastin.  Patient will also receive Zometa today.  She will require B12 injections and folate supplement along with her pemetrexed.  Will get reimaging with CT scan and nuclear med bone scan in the next 1 to 2 weeks.  Patient will then return to clinic in 3 weeks for further evaluation and consideration of cycle 6.  If patient has improvement of disease, can consider maintenance pemetrexed and Avastin.   2.  Neutropenia: Patient's ANC is 1.1 today.  Proceed with treatment as above, but patient may require Neulasta for future treatments.  3.  Anemia: Patient's hemoglobin continues to slowly trend down and is now 8.9.  Monitor.   4.  Pain: Improved with XRT.   Continue current narcotic regimen. 5.  Palliative care: Appreciate input. 6.  Left shoulder mobility: Appreciate rehab input.  Patient expressed understanding and was in agreement with this plan. She also understands that She can call clinic at any time with any questions, concerns, or complaints.   Cancer Staging Cancer of upper lobe of right lung [facility]) Staging form: Lung, AJCC 8th Edition - Clinical stage from 08/24/2017: Stage IV (cT2a, cN3, cM1c) - Signed by Lloyd Huger, MD on 03/29/2018   Lloyd Huger, MD   06/23/2018 1:40 PM

## 2018-06-22 ENCOUNTER — Ambulatory Visit: Payer: Managed Care, Other (non HMO)

## 2018-06-23 ENCOUNTER — Inpatient Hospital Stay: Payer: Managed Care, Other (non HMO)

## 2018-06-23 ENCOUNTER — Other Ambulatory Visit: Payer: Self-pay

## 2018-06-23 ENCOUNTER — Inpatient Hospital Stay (HOSPITAL_BASED_OUTPATIENT_CLINIC_OR_DEPARTMENT_OTHER): Payer: Managed Care, Other (non HMO) | Admitting: Oncology

## 2018-06-23 ENCOUNTER — Inpatient Hospital Stay: Payer: Managed Care, Other (non HMO) | Admitting: Hospice and Palliative Medicine

## 2018-06-23 VITALS — BP 126/78 | HR 76

## 2018-06-23 VITALS — BP 154/73 | HR 90 | Temp 97.3°F | Resp 18 | Wt 121.3 lb

## 2018-06-23 DIAGNOSIS — R5383 Other fatigue: Secondary | ICD-10-CM

## 2018-06-23 DIAGNOSIS — M1990 Unspecified osteoarthritis, unspecified site: Secondary | ICD-10-CM

## 2018-06-23 DIAGNOSIS — R531 Weakness: Secondary | ICD-10-CM

## 2018-06-23 DIAGNOSIS — R5381 Other malaise: Secondary | ICD-10-CM | POA: Diagnosis not present

## 2018-06-23 DIAGNOSIS — C7951 Secondary malignant neoplasm of bone: Secondary | ICD-10-CM | POA: Diagnosis not present

## 2018-06-23 DIAGNOSIS — Z79899 Other long term (current) drug therapy: Secondary | ICD-10-CM

## 2018-06-23 DIAGNOSIS — Z780 Asymptomatic menopausal state: Secondary | ICD-10-CM

## 2018-06-23 DIAGNOSIS — C787 Secondary malignant neoplasm of liver and intrahepatic bile duct: Secondary | ICD-10-CM

## 2018-06-23 DIAGNOSIS — E039 Hypothyroidism, unspecified: Secondary | ICD-10-CM

## 2018-06-23 DIAGNOSIS — Z87891 Personal history of nicotine dependence: Secondary | ICD-10-CM

## 2018-06-23 DIAGNOSIS — C3490 Malignant neoplasm of unspecified part of unspecified bronchus or lung: Secondary | ICD-10-CM

## 2018-06-23 DIAGNOSIS — Z5112 Encounter for antineoplastic immunotherapy: Secondary | ICD-10-CM | POA: Diagnosis not present

## 2018-06-23 DIAGNOSIS — Z515 Encounter for palliative care: Secondary | ICD-10-CM | POA: Diagnosis not present

## 2018-06-23 DIAGNOSIS — D701 Agranulocytosis secondary to cancer chemotherapy: Secondary | ICD-10-CM | POA: Diagnosis not present

## 2018-06-23 DIAGNOSIS — E785 Hyperlipidemia, unspecified: Secondary | ICD-10-CM | POA: Diagnosis not present

## 2018-06-23 DIAGNOSIS — D649 Anemia, unspecified: Secondary | ICD-10-CM

## 2018-06-23 DIAGNOSIS — Z923 Personal history of irradiation: Secondary | ICD-10-CM | POA: Diagnosis not present

## 2018-06-23 DIAGNOSIS — T451X5S Adverse effect of antineoplastic and immunosuppressive drugs, sequela: Secondary | ICD-10-CM | POA: Diagnosis not present

## 2018-06-23 DIAGNOSIS — R1030 Lower abdominal pain, unspecified: Secondary | ICD-10-CM

## 2018-06-23 DIAGNOSIS — F419 Anxiety disorder, unspecified: Secondary | ICD-10-CM | POA: Diagnosis not present

## 2018-06-23 DIAGNOSIS — Z5111 Encounter for antineoplastic chemotherapy: Secondary | ICD-10-CM | POA: Diagnosis not present

## 2018-06-23 DIAGNOSIS — I10 Essential (primary) hypertension: Secondary | ICD-10-CM

## 2018-06-23 DIAGNOSIS — C3411 Malignant neoplasm of upper lobe, right bronchus or lung: Secondary | ICD-10-CM | POA: Diagnosis not present

## 2018-06-23 LAB — CBC WITH DIFFERENTIAL/PLATELET
ABS IMMATURE GRANULOCYTES: 0.04 10*3/uL (ref 0.00–0.07)
BASOS ABS: 0 10*3/uL (ref 0.0–0.1)
BASOS PCT: 1 %
Eosinophils Absolute: 0 10*3/uL (ref 0.0–0.5)
Eosinophils Relative: 1 %
HCT: 27.5 % — ABNORMAL LOW (ref 36.0–46.0)
Hemoglobin: 8.9 g/dL — ABNORMAL LOW (ref 12.0–15.0)
IMMATURE GRANULOCYTES: 2 %
Lymphocytes Relative: 22 %
Lymphs Abs: 0.5 10*3/uL — ABNORMAL LOW (ref 0.7–4.0)
MCH: 32.2 pg (ref 26.0–34.0)
MCHC: 32.4 g/dL (ref 30.0–36.0)
MCV: 99.6 fL (ref 80.0–100.0)
Monocytes Absolute: 0.4 10*3/uL (ref 0.1–1.0)
Monocytes Relative: 18 %
NEUTROS ABS: 1.1 10*3/uL — AB (ref 1.7–7.7)
NEUTROS PCT: 56 %
NRBC: 0 % (ref 0.0–0.2)
PLATELETS: 190 10*3/uL (ref 150–400)
RBC: 2.76 MIL/uL — AB (ref 3.87–5.11)
RDW: 17.7 % — ABNORMAL HIGH (ref 11.5–15.5)
WBC: 2 10*3/uL — AB (ref 4.0–10.5)

## 2018-06-23 LAB — COMPREHENSIVE METABOLIC PANEL
ALT: 21 U/L (ref 0–44)
AST: 27 U/L (ref 15–41)
Albumin: 3.8 g/dL (ref 3.5–5.0)
Alkaline Phosphatase: 81 U/L (ref 38–126)
Anion gap: 6 (ref 5–15)
BUN: 10 mg/dL (ref 8–23)
CHLORIDE: 108 mmol/L (ref 98–111)
CO2: 26 mmol/L (ref 22–32)
CREATININE: 0.72 mg/dL (ref 0.44–1.00)
Calcium: 8.8 mg/dL — ABNORMAL LOW (ref 8.9–10.3)
GFR calc non Af Amer: >60 mL/min (ref >60)
Glucose, Bld: 90 mg/dL (ref 70–99)
Potassium: 3.7 mmol/L (ref 3.5–5.1)
SODIUM: 140 mmol/L (ref 135–145)
Total Bilirubin: 0.3 mg/dL (ref 0.3–1.2)
Total Protein: 6.9 g/dL (ref 6.5–8.1)

## 2018-06-23 LAB — PROTEIN, URINE, RANDOM: TOTAL PROTEIN, URINE: 16 mg/dL

## 2018-06-23 MED ORDER — ZOLEDRONIC ACID 4 MG/100ML IV SOLN
4.0000 mg | Freq: Once | INTRAVENOUS | Status: AC
  Administered 2018-06-23: 4 mg via INTRAVENOUS
  Filled 2018-06-23: qty 100

## 2018-06-23 MED ORDER — SODIUM CHLORIDE 0.9 % IV SOLN
440.0000 mg | Freq: Once | INTRAVENOUS | Status: AC
  Administered 2018-06-23: 440 mg via INTRAVENOUS
  Filled 2018-06-23: qty 44

## 2018-06-23 MED ORDER — SODIUM CHLORIDE 0.9 % IV SOLN
Freq: Once | INTRAVENOUS | Status: AC
  Administered 2018-06-23: 11:00:00 via INTRAVENOUS
  Filled 2018-06-23: qty 250

## 2018-06-23 MED ORDER — SODIUM CHLORIDE 0.9 % IV SOLN
800.0000 mg | Freq: Once | INTRAVENOUS | Status: AC
  Administered 2018-06-23: 800 mg via INTRAVENOUS
  Filled 2018-06-23: qty 32

## 2018-06-23 MED ORDER — HEPARIN SOD (PORK) LOCK FLUSH 100 UNIT/ML IV SOLN
500.0000 [IU] | Freq: Once | INTRAVENOUS | Status: AC
  Administered 2018-06-23: 500 [IU] via INTRAVENOUS
  Filled 2018-06-23: qty 5

## 2018-06-23 MED ORDER — SODIUM CHLORIDE 0.9% FLUSH
10.0000 mL | INTRAVENOUS | Status: DC | PRN
  Administered 2018-06-23: 10 mL via INTRAVENOUS
  Filled 2018-06-23: qty 10

## 2018-06-23 MED ORDER — PALONOSETRON HCL INJECTION 0.25 MG/5ML
0.2500 mg | Freq: Once | INTRAVENOUS | Status: AC
  Administered 2018-06-23: 0.25 mg via INTRAVENOUS
  Filled 2018-06-23: qty 5

## 2018-06-23 MED ORDER — SODIUM CHLORIDE 0.9 % IV SOLN
Freq: Once | INTRAVENOUS | Status: AC
  Administered 2018-06-23: 12:00:00 via INTRAVENOUS
  Filled 2018-06-23: qty 5

## 2018-06-23 MED ORDER — CYANOCOBALAMIN 1000 MCG/ML IJ SOLN
1000.0000 ug | Freq: Once | INTRAMUSCULAR | Status: AC
  Administered 2018-06-23: 1000 ug via INTRAMUSCULAR
  Filled 2018-06-23: qty 1

## 2018-06-23 MED ORDER — HEPARIN SOD (PORK) LOCK FLUSH 100 UNIT/ML IV SOLN: 500.0000 [IU] | Freq: Once | INTRAVENOUS | Status: DC | PRN

## 2018-06-23 MED ORDER — SODIUM CHLORIDE 0.9 % IV SOLN
500.0000 mg/m2 | Freq: Once | INTRAVENOUS | Status: AC
  Administered 2018-06-23: 800 mg via INTRAVENOUS
  Filled 2018-06-23: qty 20

## 2018-06-23 NOTE — Progress Notes (Signed)
Here for follow up. Stated R " rib cage /lung " area  Feels " good other than that pain " per pt

## 2018-06-24 ENCOUNTER — Inpatient Hospital Stay: Payer: Managed Care, Other (non HMO) | Admitting: Occupational Therapy

## 2018-06-24 ENCOUNTER — Inpatient Hospital Stay (HOSPITAL_BASED_OUTPATIENT_CLINIC_OR_DEPARTMENT_OTHER): Payer: Managed Care, Other (non HMO) | Admitting: Hospice and Palliative Medicine

## 2018-06-24 DIAGNOSIS — F419 Anxiety disorder, unspecified: Secondary | ICD-10-CM

## 2018-06-24 DIAGNOSIS — E785 Hyperlipidemia, unspecified: Secondary | ICD-10-CM

## 2018-06-24 DIAGNOSIS — C3411 Malignant neoplasm of upper lobe, right bronchus or lung: Secondary | ICD-10-CM | POA: Diagnosis not present

## 2018-06-24 DIAGNOSIS — C787 Secondary malignant neoplasm of liver and intrahepatic bile duct: Secondary | ICD-10-CM | POA: Diagnosis not present

## 2018-06-24 DIAGNOSIS — Z79899 Other long term (current) drug therapy: Secondary | ICD-10-CM

## 2018-06-24 DIAGNOSIS — R531 Weakness: Secondary | ICD-10-CM

## 2018-06-24 DIAGNOSIS — Z515 Encounter for palliative care: Secondary | ICD-10-CM | POA: Diagnosis not present

## 2018-06-24 DIAGNOSIS — C7951 Secondary malignant neoplasm of bone: Secondary | ICD-10-CM

## 2018-06-24 DIAGNOSIS — Z87891 Personal history of nicotine dependence: Secondary | ICD-10-CM

## 2018-06-24 DIAGNOSIS — E039 Hypothyroidism, unspecified: Secondary | ICD-10-CM

## 2018-06-24 DIAGNOSIS — M1990 Unspecified osteoarthritis, unspecified site: Secondary | ICD-10-CM

## 2018-06-24 DIAGNOSIS — I10 Essential (primary) hypertension: Secondary | ICD-10-CM

## 2018-06-24 LAB — THYROID PANEL WITH TSH
FREE THYROXINE INDEX: 1.9 (ref 1.2–4.9)
T3 Uptake Ratio: 23 % — ABNORMAL LOW (ref 24–39)
T4, Total: 8.3 ug/dL (ref 4.5–12.0)
TSH: 5.81 u[IU]/mL — AB (ref 0.450–4.500)

## 2018-06-24 NOTE — Progress Notes (Signed)
[place]  [phone] Fax:(336) [phone]   Name: Janet [name] Date: 06/24/2018 MRN: [id number]  DOB: [birth year]  [name], [name] [name], RN as Registered Nurse    REASON FOR CONSULTATION: Palliative Care consult requested for this 62 y.o. 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  Palliative care was asked to help support patient through treatment and address goals of care.   SOCIAL HISTORY:    Patient is married but separated.  Patient lives alone.  She has a son and daughter.  Patient used to work on an Designer, television/film set.  ADVANCE DIRECTIVES:  Does not have  CODE STATUS: Full code  PAST MEDICAL HISTORY: Past Medical History:  Diagnosis Date  . Arthritis    right shoulder  . Cancer of right lung ([place]) 08/2017   Chemo + rad tx's.   Marland Kitchen Hyperlipidemia   . Hypertension   . Hypothyroidism   . Menopausal state   . Thyroid disease     PAST SURGICAL HISTORY:  Past Surgical History:  Procedure Laterality Date  . ABDOMINAL HYSTERECTOMY  2005  . BREAST EXCISIONAL BIOPSY Right 1979  . COLONOSCOPY WITH PROPOFOL N/A 05/13/2017   Procedure: COLONOSCOPY WITH PROPOFOL;  Surgeon: [name], MD;  Location: [facility] ENDOSCOPY;  Service: Gastroenterology;  Laterality: N/A;  . CYSTECTOMY Right    breast  . CYSTECTOMY  02/2015   back of neck  . ESOPHAGOGASTRODUODENOSCOPY (EGD) WITH PROPOFOL N/A 05/13/2017   Procedure: ESOPHAGOGASTRODUODENOSCOPY (EGD) WITH PROPOFOL;  Surgeon: [name], MD;  Location: [facility] ENDOSCOPY;  Service: Gastroenterology;  Laterality: N/A;  . PORTA CATH INSERTION N/A 09/01/2017   Procedure: PORTA CATH INSERTION;  Surgeon: Algernon Huxley, MD;  Location: [place] CV LAB;  Service:  Cardiovascular;  Laterality: N/A;  . TUBAL LIGATION  1986    HEMATOLOGY/ONCOLOGY HISTORY:    Cancer of upper lobe of right lung ([place])   07/09/2017 Initial Diagnosis    Cancer of upper lobe of right lung ([place])    03/23/2018 -  Chemotherapy    The patient had palonosetron (ALOXI) injection 0.25 mg, 0.25 mg, Intravenous,  Once, 5 of 6 cycles Administration: 0.25 mg (04/01/2018), 0.25 mg (04/22/2018), 0.25 mg (05/13/2018), 0.25 mg (06/03/2018), 0.25 mg (06/23/2018) bevacizumab (AVASTIN) 900 mg in sodium chloride 0.9 % 100 mL chemo infusion, 925 mg, Intravenous,  Once, 5 of 6 cycles Administration: 900 mg (04/01/2018), 900 mg (04/22/2018), 800 mg (06/03/2018), 800 mg (06/23/2018) PEMEtrexed (ALIMTA) 800 mg in sodium chloride 0.9 % 100 mL chemo infusion, 800 mg, Intravenous,  Once, 5 of 6 cycles Administration: 800 mg (04/01/2018), 800 mg (04/22/2018), 800 mg (05/13/2018), 800 mg (06/03/2018), 800 mg (06/23/2018) CARBOplatin (PARAPLATIN) 480 mg in sodium chloride 0.9 % 250 mL chemo infusion, 480 mg (100 % of original dose 480.5 mg), Intravenous,  Once, 5 of 6 cycles Dose modification:   (original dose 480.5 mg, Cycle 1) Administration: 480 mg (04/01/2018), 480 mg (04/22/2018), 480 mg (05/13/2018), 400 mg (06/03/2018), 440 mg (06/23/2018) fosaprepitant (EMEND) 150 mg, dexamethasone (DECADRON) 12 mg in sodium chloride 0.9 % 145 mL IVPB, , Intravenous,  Once, 5 of 6 cycles Administration:  (04/01/2018),  (04/22/2018),  (05/13/2018),  (06/03/2018),  (06/23/2018)  for chemotherapy treatment.      ALLERGIES:  has No Known Allergies.  MEDICATIONS:  Current Outpatient Medications  Medication Sig Dispense Refill  . amLODipine (NORVASC) 5 MG tablet Take 1 tablet (5 mg total) by mouth daily. 90 tablet 1  . atorvastatin (LIPITOR) 10 MG tablet TAKE 1 TABLET DAILY AT 6 P.M. (Patient not taking: Reported on 06/23/2018) 90 tablet 4  . folic acid (FOLVITE) 1 MG tablet Take 1 tablet (1 mg total) by mouth daily. 90 tablet 2   . levothyroxine (SYNTHROID, LEVOTHROID) 50 MCG tablet TAKE 1 TABLET DAILY 90 tablet 3  . lidocaine-prilocaine (EMLA) cream APP EXT AA 1 TIME  3  . Oxycodone HCl 10 MG TABS Take 1 tablet (10 mg total) by mouth every 6 (six) hours as needed. (Patient not taking: Reported on 06/23/2018) 120 tablet 0   No current facility-administered medications for this visit.     VITAL SIGNS: LMP 08/08/2003 (Approximate) Comment: Hysterectomy 2004 There were no vitals filed for this visit.  Estimated body mass index is 23.69 kg/m as calculated from the following:   Height as of 06/02/18: 5' (1.524 m).   Weight as of 06/23/18: 121 lb 4.8 oz (55 kg).  LABS: CBC:    Component Value Date/Time   WBC 2.0 (L) 06/23/2018 1007   HGB 8.9 (L) 06/23/2018 1007   HGB 10.8 (L) 07/09/2017 1615   HCT 27.5 (L) 06/23/2018 1007   HCT 32.7 (L) 07/09/2017 1615   PLT 190 06/23/2018 1007   PLT 394 (H) 07/09/2017 1615   MCV 99.6 06/23/2018 1007   MCV 92 07/09/2017 1615   NEUTROABS 1.1 (L) 06/23/2018 1007   NEUTROABS 2.5 07/09/2017 1615   LYMPHSABS 0.5 (L) 06/23/2018 1007   LYMPHSABS 3.1 07/09/2017 1615   MONOABS 0.4 06/23/2018 1007   EOSABS 0.0 06/23/2018 1007   EOSABS 0.1 07/09/2017 1615   BASOSABS 0.0 06/23/2018 1007   BASOSABS 0.0 07/09/2017 1615   Comprehensive Metabolic Panel:    Component Value Date/Time   NA 140 06/23/2018 1007   NA 147 (H) 07/09/2017 1615   K 3.7 06/23/2018 1007   CL 108 06/23/2018 1007   CO2 26 06/23/2018 1007   BUN 10 06/23/2018 1007   BUN 8 07/09/2017 1615   CREATININE 0.72 06/23/2018 1007   GLUCOSE 90 06/23/2018 1007   CALCIUM 8.8 (L) 06/23/2018 1007   AST 27 06/23/2018 1007   ALT 21 06/23/2018 1007   ALKPHOS 81 06/23/2018 1007   BILITOT 0.3 06/23/2018 1007   BILITOT <0.2 07/09/2017 1615   PROT 6.9 06/23/2018 1007   PROT 6.6 07/09/2017 1615   ALBUMIN 3.8 06/23/2018 1007   ALBUMIN 4.3 07/09/2017 1615    RADIOGRAPHIC STUDIES: No results found.  PERFORMANCE STATUS  (ECOG) : 1 - Symptomatic but completely ambulatory  Review of Systems As noted above. Otherwise, a complete review of systems is negative.  Physical Exam General: NAD, frail appearing, thin Cardiovascular: regular rate and rhythm Pulmonary: clear ant fields Abdomen: soft, nontender, + bowel sounds GU: no suprapubic tenderness Extremities: no edema, no joint deformities Skin: no rashes Neurological: Weakness but otherwise nonfocal  IMPRESSION: Follow up visit today with patient in the clinic. She reports doing well. She says her pain as been improved. Case discussed with OT. ROM improved over the past two weeks.   Patient says her moods and anxiety have also improved recently. She is looking forward to Thanksgiving and has plans with family.   Patient had no acute changes, concerns, or questions today.   Will plan follow up in 2-3 weeks and continue conversations  regarding goals/ACP.  PLAN: Continue oxycodone as needed for pain Recommend completion of healthcare power of attorney/living will/MOST form Appointment scheduled for 11/20  Patient expressed understanding and was in agreement with this plan. She also understands that She can call clinic at any time with any questions, concerns, or complaints.    Time Total: 15 minutes  Visit consisted of counseling and education dealing with the complex and emotionally intense issues of symptom management and palliative care in the setting of serious and potentially life-threatening illness.Greater than 50%  of this time was spent counseling and coordinating care related to the above assessment and plan.  Signed by: Altha Harm, [place], NP-C, [place] (Work Cell)

## 2018-06-24 NOTE — Therapy (Signed)
[facility] [address] [place], [place], [place], [address] Phone: [phone]   Fax:  [phone]  Occupational Therapy Treatment  Patient Details  Name: HIYA POINT MRN: [id number] Date of Birth: 1955/09/12 No data recorded  Encounter Date: 06/24/2018    Past Medical History:  Diagnosis Date  . Arthritis    right shoulder  . Cancer of right lung ([place]) 08/2017   Chemo + rad tx's.   Marland Kitchen Hyperlipidemia   . Hypertension   . Hypothyroidism   . Menopausal state   . Thyroid disease     Past Surgical History:  Procedure Laterality Date  . ABDOMINAL HYSTERECTOMY  2005  . BREAST EXCISIONAL BIOPSY Right 1979  . COLONOSCOPY WITH PROPOFOL N/A 05/13/2017   Procedure: COLONOSCOPY WITH PROPOFOL;  Surgeon: [name], MD;  Location: [facility] ENDOSCOPY;  Service: Gastroenterology;  Laterality: N/A;  . CYSTECTOMY Right    breast  . CYSTECTOMY  02/2015   back of neck  . ESOPHAGOGASTRODUODENOSCOPY (EGD) WITH PROPOFOL N/A 05/13/2017   Procedure: ESOPHAGOGASTRODUODENOSCOPY (EGD) WITH PROPOFOL;  Surgeon: [name], MD;  Location: [facility] ENDOSCOPY;  Service: Gastroenterology;  Laterality: N/A;  . PORTA CATH INSERTION N/A 09/01/2017   Procedure: PORTA CATH INSERTION;  Surgeon: Algernon Huxley, MD;  Location: [place] CV LAB;  Service: Cardiovascular;  Laterality: N/A;  . TUBAL LIGATION  1986    There were no vitals filed for this visit.  Subjective Assessment - 06/24/18 1200    Subjective   My shoulder still hurts when I have to do my hair and hold me arm up  and in the am or sleeping on it     Patient Stated Goals  I would like my motion and pain to get better in my L shoulder so I can reach into cabinets, fasten my bra, pull clothing over my head  and sleeping on my arm     Currently in Pain?  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  Having hard time sleeping on L side , and doing her hair for while    Pt at rest has no pain - tender over acromion process -   Posture and cervical AROM in all planes WNL     This it pt's 3rd session with this OT  - report she is able to sleep on the L side now little better  and ROM increased greatly since last time in flexion and ABD but still pain in external rotation when over head act for period of time to do her hair     AROM for shoulder flexion R 150 , L 102 degrees (last time and now 130  ABD R 148 and L 110 last time and now 140  Extention R 70 and L 55 and still same  Pain only with external rotation and resistance into  flexion - 4/10 pain   Pt had no pain with AAROM and PROM by OT , or in supine with weight this date   This date change HEP to cont over head in supine  AROM and AAROM      Pt to use wand in supine kept same   - over head Shoulder flexion  Overhead out to 45 degrees ABD  External rotation over head  10-15 reps pain free   upgrade this date  1 lbs weight or can of soup for her D1 and D2 exercises below and scapula protraction  AROM D1 and D2 patterns  10-15 degrees  pain free And scapula protraction in supine  10 reps  no pain   Add stabilization exercises for keep shoulder in supine at 90 degrees ,elbow extended - and tap all directions 30 sec  X 3  And stabilization  on the wall with  palms fw and shoulders abd at 90 - pump back towards wall  Then back against  wall and rowing  And  Then pump upper arms to wall with arms  in external rotation  10 sec  X 3 and then  increase every 3 days to 15, 20 and then 30 sec  If no increase [pain      Cont with scapula squeezes 10 reps  4 x day    Will check on pt in  2-3  weeks again                                     Patient will benefit from skilled therapeutic intervention in order to improve the following deficits and impairments:     Visit Diagnosis: Cancer of upper lobe of right lung [facility])    Problem List Patient Active Problem List   Diagnosis Date Noted  . Goals of care, counseling/discussion 08/25/2017  . Cancer of upper lobe of right lung ([place]) 07/09/2017  . Abnormal weight loss 04/04/2017  . GERD (gastroesophageal reflux disease) 04/04/2017  . Hypertension 08/04/2015  . Hypothyroidism 08/04/2015  . Menopausal symptom 08/04/2015  . Hyperlipidemia 08/04/2015  . Osteoma 01/16/2015    Rosalyn Gess OTR/L,CLT 06/24/2018, 12:01 PM  [facility] [address] [place], [place], [place], [address] Phone: [phone]   Fax:  [phone]  Name: KINNLEY PAULSON MRN: [id number] Date of Birth: 1955-10-13

## 2018-07-01 ENCOUNTER — Telehealth: Payer: Self-pay

## 2018-07-01 MED ORDER — AMLODIPINE BESYLATE 5 MG PO TABS: 5.0000 mg | ORAL_TABLET | Freq: Every day | ORAL | 1 refills | Status: DC

## 2018-07-01 NOTE — Telephone Encounter (Signed)
Patient last seen 04/09/18 and has appointment 10/08/18.

## 2018-07-08 ENCOUNTER — Ambulatory Visit
Admission: RE | Admit: 2018-07-08 | Discharge: 2018-07-08 | Disposition: A | Discharge status: Home / Self Care | Payer: Managed Care, Other (non HMO) | Source: Ambulatory Visit | Attending: Oncology | Admitting: Oncology

## 2018-07-08 ENCOUNTER — Other Ambulatory Visit: Payer: Self-pay | Admitting: Nurse Practitioner

## 2018-07-08 DIAGNOSIS — C3411 Malignant neoplasm of upper lobe, right bronchus or lung: Secondary | ICD-10-CM

## 2018-07-08 MED ORDER — AMLODIPINE BESYLATE 5 MG PO TABS: 5.0000 mg | ORAL_TABLET | Freq: Every day | ORAL | 1 refills | Status: DC

## 2018-07-08 MED ORDER — TECHNETIUM TC 99M MEDRONATE IV KIT
22.6900 | PACK | Freq: Once | INTRAVENOUS | Status: AC | PRN
  Administered 2018-07-08: 22.69 via INTRAVENOUS

## 2018-07-08 MED ORDER — IOPAMIDOL (ISOVUE-300) INJECTION 61%
100.0000 mL | Freq: Once | INTRAVENOUS | Status: AC | PRN
  Administered 2018-07-08: 100 mL via INTRAVENOUS

## 2018-07-08 NOTE — Addendum Note (Signed)
Addended by: Matilde Sprang on: 07/08/2018 10:00 AM   Modules accepted: Orders

## 2018-07-08 NOTE — Progress Notes (Signed)
[facility] called reporting no refill received on medication.  Medication refill sent today.

## 2018-07-08 NOTE — Telephone Encounter (Addendum)
Holley Dexter w/[facility] [phone], invoice # [phone] needs directions for the patient's amLODipine (NORVASC) 5 MG tablet  prescription.  Please advise.

## 2018-07-08 NOTE — Telephone Encounter (Signed)
Completed.

## 2018-07-08 NOTE — Telephone Encounter (Signed)
[facility] called and spoke to Janet [name], [place], reference [id number]. She asked for clarification on the instructions for Amlodipine that was received on 06/29/18 by Janet [name]. I asked did she receive a refill on 07/01/18 #90/1 refill authorized by Marnee Guarneri, DNP. She says that was never received and would accept the call in for that Rx never received. She was asking for the information for Janet [name] and says  requires the license #. I advised I didn't have it and called to speak to Janet [name], Janet [name] who advised to send this request over to the office and she would send to [facility]. I advised Janet [name] of the above, she verbalized understanding and says she will make a note.

## 2018-07-12 NOTE — Progress Notes (Signed)
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    INTERVAL HISTORY: Patient returns to clinic today for further evaluation, discussion of her imaging results, and consideration of cycle 6 of carboplatinum, pemetrexed, and Avastin.  She currently feels well.  She does not complain of pain today.  She continues to have chronic weakness and fatigue.  She has no neurologic complaints.  She denies any recent fevers or illnesses. She denies any chest pain, shortness of breath, hemoptysis, or cough.  She has no nausea, vomiting, constipation, or diarrhea.  She has no melena or hematochezia.  She has no urinary complaints.  Patient offers no further specific complaints today.  REVIEW OF SYSTEMS:   Review of Systems  Constitutional: Positive for malaise/fatigue. Negative for fever and weight loss.  Respiratory: Negative.  Negative for cough and shortness of breath.   Cardiovascular: Negative.  Negative for chest pain and leg swelling.  Gastrointestinal: Negative.  Negative for abdominal pain, blood in stool and melena.  Genitourinary: Negative.  Negative for dysuria and flank pain.  Musculoskeletal: Negative for back pain and joint pain.  Skin: Negative.  Negative for rash.  Neurological: Positive for weakness. Negative for sensory change, focal weakness and headaches.  Psychiatric/Behavioral: Negative.  The patient is not nervous/anxious.     As per HPI. Otherwise, a complete review of systems is negative.  PAST MEDICAL HISTORY: Past Medical History:  Diagnosis Date  . Arthritis    right shoulder  . Cancer of right lung ([place]) 08/2017   Chemo + rad tx's.   Marland Kitchen  Hyperlipidemia   . Hypertension   . Hypothyroidism   . Menopausal state   . Thyroid disease     PAST SURGICAL HISTORY: Past Surgical History:  Procedure Laterality Date  . ABDOMINAL HYSTERECTOMY  2005  . BREAST EXCISIONAL BIOPSY Right 1979  . COLONOSCOPY WITH PROPOFOL N/A 05/13/2017   Procedure: COLONOSCOPY WITH PROPOFOL;  Surgeon: [name], MD;  Location: [facility] ENDOSCOPY;  Service: Gastroenterology;  Laterality: N/A;  . CYSTECTOMY Right    breast  . CYSTECTOMY  02/2015   back of neck  . ESOPHAGOGASTRODUODENOSCOPY (EGD) WITH PROPOFOL N/A 05/13/2017   Procedure: ESOPHAGOGASTRODUODENOSCOPY (EGD) WITH PROPOFOL;  Surgeon: [name], MD;  Location: [facility] ENDOSCOPY;  Service: Gastroenterology;  Laterality: N/A;  . PORTA CATH INSERTION N/A 09/01/2017   Procedure: PORTA CATH INSERTION;  Surgeon: Algernon Huxley, MD;  Location: [facility] LAB;  Service: Cardiovascular;  Laterality: N/A;  . TUBAL LIGATION  1986    FAMILY HISTORY: Family History  Problem Relation Age of Onset  . Hypertension Mother   . Stroke Mother   . Leukemia Mother   . Stroke Father   . Pneumonia Father   . Diabetes Sister   . Hyperlipidemia Sister   . Breast cancer Maternal Aunt 59    ADVANCED DIRECTIVES (Y/N):  N  HEALTH MAINTENANCE: Social History   Tobacco Use  . Smoking status: Former Smoker    Packs/day: 0.25    Types: Cigarettes    Last attempt to quit: 02/01/2017    Years since quitting: 1.4  . Smokeless tobacco: Never Used  Substance Use Topics  . Alcohol use: No    Alcohol/week: 0.0  standard drinks  . Drug use: No     Colonoscopy:  PAP:  Bone density:  Lipid panel:  No Known Allergies  Current Outpatient Medications  Medication Sig Dispense Refill  . amLODipine (NORVASC) 5 MG tablet Take 1 tablet (5 mg total) by mouth daily. 90 tablet 1  . folic acid (FOLVITE) 1 MG tablet Take 1 tablet (1 mg total) by mouth daily. 90 tablet 2  . levothyroxine (SYNTHROID, LEVOTHROID) 50 MCG tablet  TAKE 1 TABLET DAILY 90 tablet 3  . lidocaine-prilocaine (EMLA) cream APP EXT AA 1 TIME  3  . atorvastatin (LIPITOR) 10 MG tablet TAKE 1 TABLET DAILY AT 6 P.M. (Patient not taking: Reported on 06/23/2018) 90 tablet 4  . Oxycodone HCl 10 MG TABS Take 1 tablet (10 mg total) by mouth every 6 (six) hours as needed. (Patient not taking: Reported on 06/23/2018) 120 tablet 0   No current facility-administered medications for this visit.     OBJECTIVE: Vitals:   07/14/18 0941  BP: 138/88  Pulse: 98  Resp: 18  Temp: (!) 97.4 F (36.3 C)     Body mass index is 23.46 kg/m.    ECOG FS:0 - Asymptomatic  General: Well-developed, well-nourished, no acute distress. Eyes: Pink conjunctiva, anicteric sclera. HEENT: Normocephalic, moist mucous membranes. Lungs: Clear to auscultation bilaterally. Heart: Regular rate and rhythm. No rubs, murmurs, or gallops. Abdomen: Soft, nontender, nondistended. No organomegaly noted, normoactive bowel sounds. Musculoskeletal: No edema, cyanosis, or clubbing. Neuro: Alert, answering all questions appropriately. Cranial nerves grossly intact. Skin: No rashes or petechiae noted. Psych: Normal affect.  LAB RESULTS:  Lab Results  Component Value Date   NA 140 07/14/2018   K 3.7 07/14/2018   CL 105 07/14/2018   CO2 27 07/14/2018   GLUCOSE 76 07/14/2018   BUN 10 07/14/2018   CREATININE 0.87 07/14/2018   CALCIUM 9.1 07/14/2018   PROT 7.4 07/14/2018   ALBUMIN 4.1 07/14/2018   AST 30 07/14/2018   ALT 23 07/14/2018   ALKPHOS 78 07/14/2018   BILITOT 0.4 07/14/2018   GFRNONAA >60 07/14/2018   GFRAA >60 07/14/2018    Lab Results  Component Value Date   WBC 2.2 (L) 07/14/2018   NEUTROABS 1.2 (L) 07/14/2018   HGB 9.3 (L) 07/14/2018   HCT 28.5 (L) 07/14/2018   MCV 103.6 (H) 07/14/2018   PLT 167 07/14/2018     STUDIES: Ct Chest W Contrast  Result Date: 07/09/2018 CLINICAL DATA:  Progressive stage IV right lung cancer EXAM: CT CHEST, ABDOMEN, AND PELVIS  WITH CONTRAST TECHNIQUE: Multidetector CT imaging of the chest, abdomen and pelvis was performed following the standard protocol during bolus administration of intravenous contrast. CONTRAST:  121mL ISOVUE-300 IOPAMIDOL (ISOVUE-300) INJECTION 61% COMPARISON:  PET-CT dated 03/20/2018.  CT chest dated 03/06/2018. FINDINGS: CT CHEST FINDINGS Cardiovascular: Heart is normal in size.  No pericardial effusion. No evidence of thoracic aortic aneurysm. Atherosclerotic calcifications of the aortic arch. Mediastinum/Nodes: No suspicious mediastinal, hilar, or axillary lymphadenopathy. Visualized thyroid is notable for an 8 mm short axis left thyroid nodule. Lungs/Pleura: Radiation changes in the medial right upper lobe and right infrahilar region. Additional mild radiation changes in the left paramediastinal region. 9 mm cavitary nodule/metastasis in the posterior right lower lobe (series 3/image 88), previously 10 mm. 6 mm nodule at the left lung base (series 3/image 83), previously 5 mm. Additional small nodules bilaterally. No focal consolidation. No pleural effusion or pneumothorax. Musculoskeletal: Degenerative changes of the thoracic spine. Known metastasis involving the left  acromion is not well visualized on CT. CT ABDOMEN PELVIS FINDINGS Hepatobiliary: 1.8 cm metastasis in segment 7 (series 2/image 43), previously 2.6 cm. Additional 1.3 cm metastasis in the central right liver (series 2/image 46), previously 1.7 cm. Gallbladder is underdistended but unremarkable. No intrahepatic or extrahepatic ductal dilatation. Pancreas: Within normal limits. Spleen: Within normal limits. 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 Right adrenal glands within normal limits. Kidneys are within normal limits.  No hydronephrosis. Bladder is within normal limits. Stomach/Bowel: Stomach is within normal limits. No evidence of bowel obstruction. Appendix is poorly  visualized. Vascular/Lymphatic: No evidence of abdominal aortic aneurysm. Atherosclerotic calcifications of the abdominal aorta and branch vessels. No suspicious abdominopelvic lymphadenopathy. Reproductive: Status post hysterectomy. No adnexal masses. Other: No abdominopelvic ascites. Musculoskeletal: Mild degenerative changes of the lumbar spine. Left iliac metastasis (series 2/image 82), better evaluated on prior PET. Additional known metastasis involving the left iliac crest is not well visualized on CT. IMPRESSION: Radiation changes in the medial right upper lobe and right infrahilar region, as above. Bilateral pulmonary nodules/metastases, overall grossly unchanged. Hepatic metastases are mildly improved. Left adrenal metastasis is likely grossly unchanged. Known osseous metastases are poorly evaluated on CT. Electronically Signed   By: Julian Hy M.D.   On: 07/09/2018 08:56   Nm Bone Scan Whole Body  Result Date: 07/09/2018 CLINICAL DATA:  62 year old with current history of metastatic RIGHT UPPER LOBE lung cancer. Greater than 1 month history of LEFT shoulder pain. EXAM: NUCLEAR MEDICINE WHOLE BODY BONE SCAN TECHNIQUE: Whole body anterior and posterior images were obtained approximately 3 hours after intravenous injection of radiopharmaceutical. RADIOPHARMACEUTICALS:  22.7 mCi Technetium-68m MDP IV. COMPARISON:  No prior bone scan. Bone window images from CT chest, abdomen and pelvis obtained subsequently same day. PET-CT 03/20/2018. FINDINGS: Multiple foci of abnormal activity representing osseous metastatic disease including: LOWER sternum, LEFT acromion, RIGHT ANTERIOR seventh rib, LEFT iliac bone, LEFT INFERIOR pubic ramus and both proximal femurs. A tiny focus of increased activity at the cervicothoracic junction is likely degenerative. IMPRESSION: Multiple osseous metastases as detailed above. Electronically Signed   By: Evangeline Dakin M.D.   On: 07/09/2018 08:40   Ct Abdomen Pelvis W  Contrast  Result Date: 07/09/2018 CLINICAL DATA:  Progressive stage IV right lung cancer EXAM: CT CHEST, ABDOMEN, AND PELVIS WITH CONTRAST TECHNIQUE: Multidetector CT imaging of the chest, abdomen and pelvis was performed following the standard protocol during bolus administration of intravenous contrast. CONTRAST:  144mL ISOVUE-300 IOPAMIDOL (ISOVUE-300) INJECTION 61% COMPARISON:  PET-CT dated 03/20/2018.  CT chest dated 03/06/2018. FINDINGS: CT CHEST FINDINGS Cardiovascular: Heart is normal in size.  No pericardial effusion. No evidence of thoracic aortic aneurysm. Atherosclerotic calcifications of the aortic arch. Mediastinum/Nodes: No suspicious mediastinal, hilar, or axillary lymphadenopathy. Visualized thyroid is notable for an 8 mm short axis left thyroid nodule. Lungs/Pleura: Radiation changes in the medial right upper lobe and right infrahilar region. Additional mild radiation changes in the left paramediastinal region. 9 mm cavitary nodule/metastasis in the posterior right lower lobe (series 3/image 88), previously 10 mm. 6 mm nodule at the left lung base (series 3/image 83), previously 5 mm. Additional small nodules bilaterally. No focal consolidation. No pleural effusion or pneumothorax. Musculoskeletal: Degenerative changes of the thoracic spine. Known metastasis involving the left acromion is not well visualized on CT. CT ABDOMEN PELVIS FINDINGS Hepatobiliary: 1.8 cm metastasis in segment 7 (series 2/image 43), previously 2.6 cm. Additional 1.3 cm metastasis in the  central right liver (series 2/image 46), previously 1.7 cm. Gallbladder is underdistended but unremarkable. No intrahepatic or extrahepatic ductal dilatation. Pancreas: Within normal limits. Spleen: Within normal limits. 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 Right adrenal glands within normal limits. Kidneys are within normal limits.  No hydronephrosis. Bladder is  within normal limits. Stomach/Bowel: Stomach is within normal limits. No evidence of bowel obstruction. Appendix is poorly visualized. Vascular/Lymphatic: No evidence of abdominal aortic aneurysm. Atherosclerotic calcifications of the abdominal aorta and branch vessels. No suspicious abdominopelvic lymphadenopathy. Reproductive: Status post hysterectomy. No adnexal masses. Other: No abdominopelvic ascites. Musculoskeletal: Mild degenerative changes of the lumbar spine. Left iliac metastasis (series 2/image 82), better evaluated on prior PET. Additional known metastasis involving the left iliac crest is not well visualized on CT. IMPRESSION: Radiation changes in the medial right upper lobe and right infrahilar region, as above. Bilateral pulmonary nodules/metastases, overall grossly unchanged. Hepatic metastases are mildly improved. Left adrenal metastasis is likely grossly unchanged. Known osseous metastases are poorly evaluated on CT. Electronically Signed   By: Julian Hy M.D.   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     PLAN:    1. 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  Nuclear med bone scan on the same day was essentially unchanged.  OmniSeq testing did not reveal any actionable mutations.  Patient wishes to continue with palliative treatment.  Proceed with cycle 6 of carboplatinum, pemetrexed, and Avastin.  Patient received Zometa on the odd-numbered cycles.  She will require B12 injections and folate supplement along with her pemetrexed.  Plan to do a total of 8 cycles and then switch patient to maintenance pemetrexed and Avastin.  Return to clinic in 3 weeks for further evaluation and  consideration of cycle 7. 2.  Neutropenia: Patient's ANC is 1.2 today.  Proceed with treatment as above, but patient may require Neulasta for future treatments.  3.  Anemia: Patient's hemoglobin is decreased, but stable at 9.3.  Monitor. 4.  Pain: Patient does not complain of this today.  Improved with XRT.  Continue current narcotic regimen. 5.  Palliative care: Appreciate input. 6.  Left shoulder mobility: Appreciate rehab input.  Patient expressed understanding and was in agreement with this plan. She also understands that She can call clinic at any time with any questions, concerns, or complaints.   Cancer Staging Cancer of upper lobe of right lung [facility]) Staging form: Lung, AJCC 8th Edition - Clinical stage from 08/24/2017: Stage IV (cT2a, cN3, cM1c) - Signed by Lloyd Huger, MD on 03/29/2018   Lloyd Huger, MD   07/16/2018 6:41 AM

## 2018-07-14 ENCOUNTER — Inpatient Hospital Stay: Payer: Managed Care, Other (non HMO) | Attending: Oncology

## 2018-07-14 ENCOUNTER — Inpatient Hospital Stay: Payer: Managed Care, Other (non HMO)

## 2018-07-14 ENCOUNTER — Encounter: Payer: Self-pay | Admitting: Oncology

## 2018-07-14 ENCOUNTER — Inpatient Hospital Stay (HOSPITAL_BASED_OUTPATIENT_CLINIC_OR_DEPARTMENT_OTHER): Payer: Managed Care, Other (non HMO) | Admitting: Hospice and Palliative Medicine

## 2018-07-14 ENCOUNTER — Inpatient Hospital Stay (HOSPITAL_BASED_OUTPATIENT_CLINIC_OR_DEPARTMENT_OTHER): Payer: Managed Care, Other (non HMO) | Admitting: Oncology

## 2018-07-14 VITALS — BP 138/88 | HR 98 | Temp 97.4°F | Resp 18 | Wt 120.1 lb

## 2018-07-14 DIAGNOSIS — Z5111 Encounter for antineoplastic chemotherapy: Secondary | ICD-10-CM | POA: Diagnosis not present

## 2018-07-14 DIAGNOSIS — D701 Agranulocytosis secondary to cancer chemotherapy: Secondary | ICD-10-CM | POA: Diagnosis not present

## 2018-07-14 DIAGNOSIS — E785 Hyperlipidemia, unspecified: Secondary | ICD-10-CM | POA: Insufficient documentation

## 2018-07-14 DIAGNOSIS — C3411 Malignant neoplasm of upper lobe, right bronchus or lung: Secondary | ICD-10-CM

## 2018-07-14 DIAGNOSIS — Z87891 Personal history of nicotine dependence: Secondary | ICD-10-CM | POA: Insufficient documentation

## 2018-07-14 DIAGNOSIS — M1990 Unspecified osteoarthritis, unspecified site: Secondary | ICD-10-CM | POA: Insufficient documentation

## 2018-07-14 DIAGNOSIS — C786 Secondary malignant neoplasm of retroperitoneum and peritoneum: Secondary | ICD-10-CM | POA: Diagnosis not present

## 2018-07-14 DIAGNOSIS — T451X5S Adverse effect of antineoplastic and immunosuppressive drugs, sequela: Secondary | ICD-10-CM

## 2018-07-14 DIAGNOSIS — Z79899 Other long term (current) drug therapy: Secondary | ICD-10-CM | POA: Insufficient documentation

## 2018-07-14 DIAGNOSIS — Z803 Family history of malignant neoplasm of breast: Secondary | ICD-10-CM

## 2018-07-14 DIAGNOSIS — Z923 Personal history of irradiation: Secondary | ICD-10-CM | POA: Diagnosis not present

## 2018-07-14 DIAGNOSIS — R5381 Other malaise: Secondary | ICD-10-CM | POA: Insufficient documentation

## 2018-07-14 DIAGNOSIS — Z515 Encounter for palliative care: Secondary | ICD-10-CM

## 2018-07-14 DIAGNOSIS — I10 Essential (primary) hypertension: Secondary | ICD-10-CM

## 2018-07-14 DIAGNOSIS — C7972 Secondary malignant neoplasm of left adrenal gland: Secondary | ICD-10-CM | POA: Insufficient documentation

## 2018-07-14 DIAGNOSIS — R5382 Chronic fatigue, unspecified: Secondary | ICD-10-CM | POA: Diagnosis not present

## 2018-07-14 DIAGNOSIS — D649 Anemia, unspecified: Secondary | ICD-10-CM

## 2018-07-14 DIAGNOSIS — C7801 Secondary malignant neoplasm of right lung: Secondary | ICD-10-CM

## 2018-07-14 DIAGNOSIS — R531 Weakness: Secondary | ICD-10-CM

## 2018-07-14 DIAGNOSIS — C787 Secondary malignant neoplasm of liver and intrahepatic bile duct: Secondary | ICD-10-CM

## 2018-07-14 DIAGNOSIS — R52 Pain, unspecified: Secondary | ICD-10-CM | POA: Diagnosis not present

## 2018-07-14 DIAGNOSIS — C7951 Secondary malignant neoplasm of bone: Secondary | ICD-10-CM

## 2018-07-14 DIAGNOSIS — Z90710 Acquired absence of both cervix and uterus: Secondary | ICD-10-CM

## 2018-07-14 DIAGNOSIS — E039 Hypothyroidism, unspecified: Secondary | ICD-10-CM | POA: Insufficient documentation

## 2018-07-14 DIAGNOSIS — C3490 Malignant neoplasm of unspecified part of unspecified bronchus or lung: Secondary | ICD-10-CM

## 2018-07-14 LAB — CBC WITH DIFFERENTIAL/PLATELET
ABS IMMATURE GRANULOCYTES: 0.03 10*3/uL (ref 0.00–0.07)
Basophils Absolute: 0 10*3/uL (ref 0.0–0.1)
Basophils Relative: 1 %
Eosinophils Absolute: 0 10*3/uL (ref 0.0–0.5)
Eosinophils Relative: 0 %
HEMATOCRIT: 28.5 % — AB (ref 36.0–46.0)
HEMOGLOBIN: 9.3 g/dL — AB (ref 12.0–15.0)
Immature Granulocytes: 1 %
LYMPHS ABS: 0.5 10*3/uL — AB (ref 0.7–4.0)
LYMPHS PCT: 22 %
MCH: 33.8 pg (ref 26.0–34.0)
MCHC: 32.6 g/dL (ref 30.0–36.0)
MCV: 103.6 fL — ABNORMAL HIGH (ref 80.0–100.0)
Monocytes Absolute: 0.5 10*3/uL (ref 0.1–1.0)
Monocytes Relative: 21 %
Neutro Abs: 1.2 10*3/uL — ABNORMAL LOW (ref 1.7–7.7)
Neutrophils Relative %: 55 %
Platelets: 167 10*3/uL (ref 150–400)
RBC: 2.75 MIL/uL — ABNORMAL LOW (ref 3.87–5.11)
RDW: 17.2 % — ABNORMAL HIGH (ref 11.5–15.5)
WBC: 2.2 10*3/uL — ABNORMAL LOW (ref 4.0–10.5)
nRBC: 0 % (ref 0.0–0.2)

## 2018-07-14 LAB — COMPREHENSIVE METABOLIC PANEL
ALT: 23 U/L (ref 0–44)
AST: 30 U/L (ref 15–41)
Albumin: 4.1 g/dL (ref 3.5–5.0)
Alkaline Phosphatase: 78 U/L (ref 38–126)
Anion gap: 8 (ref 5–15)
BUN: 10 mg/dL (ref 8–23)
CO2: 27 mmol/L (ref 22–32)
Calcium: 9.1 mg/dL (ref 8.9–10.3)
Chloride: 105 mmol/L (ref 98–111)
Creatinine, Ser: 0.87 mg/dL (ref 0.44–1.00)
GFR calc Af Amer: >60 mL/min (ref >60)
GFR calc non Af Amer: >60 mL/min (ref >60)
Glucose, Bld: 76 mg/dL (ref 70–99)
Potassium: 3.7 mmol/L (ref 3.5–5.1)
Sodium: 140 mmol/L (ref 135–145)
Total Bilirubin: 0.4 mg/dL (ref 0.3–1.2)
Total Protein: 7.4 g/dL (ref 6.5–8.1)

## 2018-07-14 LAB — PROTEIN, URINE, RANDOM: Total Protein, Urine: 27 mg/dL

## 2018-07-14 MED ORDER — PALONOSETRON HCL INJECTION 0.25 MG/5ML
0.2500 mg | Freq: Once | INTRAVENOUS | Status: AC
  Administered 2018-07-14: 0.25 mg via INTRAVENOUS
  Filled 2018-07-14: qty 5

## 2018-07-14 MED ORDER — HEPARIN SOD (PORK) LOCK FLUSH 100 UNIT/ML IV SOLN
500.0000 [IU] | Freq: Once | INTRAVENOUS | Status: AC
  Administered 2018-07-14: 500 [IU] via INTRAVENOUS
  Filled 2018-07-14: qty 5

## 2018-07-14 MED ORDER — SODIUM CHLORIDE 0.9% FLUSH
10.0000 mL | INTRAVENOUS | Status: DC | PRN
  Administered 2018-07-14: 10 mL via INTRAVENOUS
  Filled 2018-07-14: qty 10

## 2018-07-14 MED ORDER — SODIUM CHLORIDE 0.9 % IV SOLN
Freq: Once | INTRAVENOUS | Status: AC
  Administered 2018-07-14: 11:00:00 via INTRAVENOUS
  Filled 2018-07-14: qty 250

## 2018-07-14 MED ORDER — SODIUM CHLORIDE 0.9 % IV SOLN
500.0000 mg/m2 | Freq: Once | INTRAVENOUS | Status: AC
  Administered 2018-07-14: 800 mg via INTRAVENOUS
  Filled 2018-07-14: qty 20

## 2018-07-14 MED ORDER — SODIUM CHLORIDE 0.9 % IV SOLN
800.0000 mg | Freq: Once | INTRAVENOUS | Status: AC
  Administered 2018-07-14: 800 mg via INTRAVENOUS
  Filled 2018-07-14: qty 32

## 2018-07-14 MED ORDER — SODIUM CHLORIDE 0.9 % IV SOLN
440.0000 mg | Freq: Once | INTRAVENOUS | Status: AC
  Administered 2018-07-14: 440 mg via INTRAVENOUS
  Filled 2018-07-14: qty 44

## 2018-07-14 MED ORDER — SODIUM CHLORIDE 0.9 % IV SOLN
Freq: Once | INTRAVENOUS | Status: AC
  Administered 2018-07-14: 11:00:00 via INTRAVENOUS
  Filled 2018-07-14: qty 5

## 2018-07-14 MED ORDER — CYANOCOBALAMIN 1000 MCG/ML IJ SOLN
1000.0000 ug | Freq: Once | INTRAMUSCULAR | Status: AC
  Administered 2018-07-14: 1000 ug via INTRAMUSCULAR
  Filled 2018-07-14: qty 1

## 2018-07-14 NOTE — Progress Notes (Signed)
Patient denies any concerns today.  

## 2018-07-14 NOTE — Progress Notes (Signed)
[place]  [phone] [phone]   Name: Janet [name] Date: 07/14/2018 MRN: [id number]  DOB: May 15, 1956  Patient Care Team: Volney American, PA-C as PCP - General (Family Medicine) [name], RN as Registered Nurse    REASON FOR CONSULTATION: Palliative Care consult requested for this 62 y.o. 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  Palliative care was asked to help support patient through treatment and address goals of care.   SOCIAL HISTORY:    Patient is married but separated.  Patient lives alone.  She has a son and daughter.  Patient used to work on an Design[employer], television/film set.  ADVANCE DIRECTIVES:  Does not have  CODE STATUS: Full code  PAST MEDICAL HISTORY: Past Medical History:  Diagnosis Date  . Arthritis    right shoulder  . Cancer of right lung ([place]) 08/2017   Chemo + rad tx's.   Marland Kitchen Hyperlipidemia   . Hypertension   . Hypothyroidism   . Menopausal state   . Thyroid disease     PAST SURGICAL HISTORY:  Past Surgical History:  Procedure Laterality Date  . ABDOMINAL HYSTERECTOMY  2005  . BREAST EXCISIONAL BIOPSY Right 1979  . COLONOSCOPY WITH PROPOFOL N/A 05/13/2017   Procedure: COLONOSCOPY WITH PROPOFOL;  Surgeon: [name], MD;  Location: [facility] ENDOSCOPY;  Service: Gastroenterology;  Laterality: N/A;  . CYSTECTOMY Right    breast  . CYSTECTOMY  02/2015   back of neck  . ESOPHAGOGASTRODUODENOSCOPY (EGD) WITH PROPOFOL N/A 05/13/2017   Procedure: ESOPHAGOGASTRODUODENOSCOPY (EGD) WITH PROPOFOL;  Surgeon: [name], MD;  Location: [facility] ENDOSCOPY;  Service: Gastroenterology;  Laterality: N/A;  . PORTA CATH INSERTION N/A 09/01/2017   Procedure: PORTA CATH INSERTION;  Surgeon: Algernon Huxley, MD;  Location: [facility] LAB;   Service: Cardiovascular;  Laterality: N/A;  . TUBAL LIGATION  1986    HEMATOLOGY/ONCOLOGY HISTORY:    Cancer of upper lobe of right lung ([place])   07/09/2017 Initial Diagnosis    Cancer of upper lobe of right lung ([place])    03/23/2018 -  Chemotherapy    The patient had palonosetron (ALOXI) injection 0.25 mg, 0.25 mg, Intravenous,  Once, 6 of 6 cycles Administration: 0.25 mg (04/01/2018), 0.25 mg (04/22/2018), 0.25 mg (05/13/2018), 0.25 mg (06/03/2018), 0.25 mg (06/23/2018) bevacizumab (AVASTIN) 900 mg in sodium chloride 0.9 % 100 mL chemo infusion, 925 mg, Intravenous,  Once, 6 of 6 cycles Administration: 900 mg (04/01/2018), 900 mg (04/22/2018), 800 mg (06/03/2018), 800 mg (06/23/2018) PEMEtrexed (ALIMTA) 800 mg in sodium chloride 0.9 % 100 mL chemo infusion, 800 mg, Intravenous,  Once, 6 of 6 cycles Administration: 800 mg (04/01/2018), 800 mg (04/22/2018), 800 mg (05/13/2018), 800 mg (06/03/2018), 800 mg (06/23/2018) CARBOplatin (PARAPLATIN) 480 mg in sodium chloride 0.9 % 250 mL chemo infusion, 480 mg (100 % of original dose 480.5 mg), Intravenous,  Once, 6 of 6 cycles Dose modification:   (original dose 480.5 mg, Cycle 1) Administration: 480 mg (04/01/2018), 480 mg (04/22/2018), 480 mg (05/13/2018), 400 mg (06/03/2018), 440 mg (06/23/2018) fosaprepitant (EMEND) 150 mg, dexamethasone (DECADRON) 12 mg in sodium chloride 0.9 % 145 mL IVPB, , Intravenous,  Once, 6 of 6 cycles Administration:  (04/01/2018),  (04/22/2018),  (05/13/2018),  (06/03/2018),  (06/23/2018)  for chemotherapy treatment.      ALLERGIES:  has No Known Allergies.  MEDICATIONS:  Current Outpatient Medications  Medication Sig Dispense Refill  . amLODipine (NORVASC) 5 MG tablet Take 1 tablet (5 mg total) by mouth daily. 90 tablet 1  . atorvastatin (LIPITOR) 10 MG tablet TAKE 1 TABLET DAILY AT 6 P.M. (Patient not taking: Reported on 06/23/2018) 90 tablet 4  . folic acid (FOLVITE) 1 MG tablet Take 1 tablet (1 mg total) by mouth daily. 90  tablet 2  . levothyroxine (SYNTHROID, LEVOTHROID) 50 MCG tablet TAKE 1 TABLET DAILY 90 tablet 3  . lidocaine-prilocaine (EMLA) cream APP EXT AA 1 TIME  3  . Oxycodone HCl 10 MG TABS Take 1 tablet (10 mg total) by mouth every 6 (six) hours as needed. (Patient not taking: Reported on 06/23/2018) 120 tablet 0   No current facility-administered medications for this visit.    Facility-Administered Medications Ordered in Other Visits  Medication Dose Route Frequency Provider Last Rate Last Dose  . bevacizumab (AVASTIN) 800 mg in sodium chloride 0.9 % 100 mL chemo infusion  800 mg Intravenous Once Lloyd Huger, MD 264 mL/hr at 07/14/18 1106 800 mg at 07/14/18 1106  . CARBOplatin (PARAPLATIN) 440 mg in sodium chloride 0.9 % 250 mL chemo infusion  440 mg Intravenous Once Lloyd Huger, MD      . heparin lock flush 100 unit/mL  500 Units Intravenous Once Lloyd Huger, MD      . PEMEtrexed (ALIMTA) 800 mg in sodium chloride 0.9 % 100 mL chemo infusion  500 mg/m2 (Treatment Plan Recorded) Intravenous Once Lloyd Huger, MD      . sodium chloride flush (NS) 0.9 % injection 10 mL  10 mL Intravenous PRN Lloyd Huger, MD   10 mL at 07/14/18 0905    VITAL SIGNS: LMP 08/08/2003 (Approximate) Comment: Hysterectomy 2004 There were no vitals filed for this visit.  Estimated body mass index is 23.46 kg/m as calculated from the following:   Height as of 06/02/18: 5' (1.524 m).   Weight as of an earlier encounter on 07/14/18: 120 lb 1.6 oz (54.5 kg).  LABS: CBC:    Component Value Date/Time   WBC 2.2 (L) 07/14/2018 0923   HGB 9.3 (L) 07/14/2018 0923   HGB 10.8 (L) 07/09/2017 1615   HCT 28.5 (L) 07/14/2018 0923   HCT 32.7 (L) 07/09/2017 1615   PLT 167 07/14/2018 0923   PLT 394 (H) 07/09/2017 1615   MCV 103.6 (H) 07/14/2018 0923   MCV 92 07/09/2017 1615   NEUTROABS 1.2 (L) 07/14/2018 0923   NEUTROABS 2.5 07/09/2017 1615   LYMPHSABS 0.5 (L) 07/14/2018 0923   LYMPHSABS  3.1 07/09/2017 1615   MONOABS 0.5 07/14/2018 0923   EOSABS 0.0 07/14/2018 0923   EOSABS 0.1 07/09/2017 1615   BASOSABS 0.0 07/14/2018 0923   BASOSABS 0.0 07/09/2017 1615   Comprehensive Metabolic Panel:    Component Value Date/Time   NA 140 07/14/2018 0923   NA 147 (H) 07/09/2017 1615   K 3.7 07/14/2018 0923   CL 105 07/14/2018 0923   CO2 27 07/14/2018 0923   BUN 10 07/14/2018 0923   BUN 8 07/09/2017 1615   CREATININE 0.87 07/14/2018 0923   GLUCOSE 76 07/14/2018 0923   CALCIUM 9.1 07/14/2018 0923   AST 30 07/14/2018 0923   ALT 23 07/14/2018 0923   ALKPHOS 78 07/14/2018 0923   BILITOT 0.4 07/14/2018 0923   BILITOT <0.2 07/09/2017 1615   PROT 7.4 07/14/2018 0923   PROT 6.6 07/09/2017 1615   ALBUMIN 4.1  07/14/2018 0923   ALBUMIN 4.3 07/09/2017 1615    RADIOGRAPHIC STUDIES: Ct Chest W Contrast  Result Date: 07/09/2018 CLINICAL DATA:  Progressive stage IV right lung cancer EXAM: CT CHEST, ABDOMEN, AND PELVIS WITH CONTRAST TECHNIQUE: Multidetector CT imaging of the chest, abdomen and pelvis was performed following the standard protocol during bolus administration of intravenous contrast. CONTRAST:  125mL ISOVUE-300 IOPAMIDOL (ISOVUE-300) INJECTION 61% COMPARISON:  PET-CT dated 03/20/2018.  CT chest dated 03/06/2018. FINDINGS: CT CHEST FINDINGS Cardiovascular: Heart is normal in size.  No pericardial effusion. No evidence of thoracic aortic aneurysm. Atherosclerotic calcifications of the aortic arch. Mediastinum/Nodes: No suspicious mediastinal, hilar, or axillary lymphadenopathy. Visualized thyroid is notable for an 8 mm short axis left thyroid nodule. Lungs/Pleura: Radiation changes in the medial right upper lobe and right infrahilar region. Additional mild radiation changes in the left paramediastinal region. 9 mm cavitary nodule/metastasis in the posterior right lower lobe (series 3/image 88), previously 10 mm. 6 mm nodule at the left lung base (series 3/image 83), previously 5 mm.  Additional small nodules bilaterally. No focal consolidation. No pleural effusion or pneumothorax. Musculoskeletal: Degenerative changes of the thoracic spine. Known metastasis involving the left acromion is not well visualized on CT. CT ABDOMEN PELVIS FINDINGS Hepatobiliary: 1.8 cm metastasis in segment 7 (series 2/image 43), previously 2.6 cm. Additional 1.3 cm metastasis in the central right liver (series 2/image 46), previously 1.7 cm. Gallbladder is underdistended but unremarkable. No intrahepatic or extrahepatic ductal dilatation. Pancreas: Within normal limits. Spleen: Within normal limits. 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 Right adrenal glands within normal limits. Kidneys are within normal limits.  No hydronephrosis. Bladder is within normal limits. Stomach/Bowel: Stomach is within normal limits. No evidence of bowel obstruction. Appendix is poorly visualized. Vascular/Lymphatic: No evidence of abdominal aortic aneurysm. Atherosclerotic calcifications of the abdominal aorta and branch vessels. No suspicious abdominopelvic lymphadenopathy. Reproductive: Status post hysterectomy. No adnexal masses. Other: No abdominopelvic ascites. Musculoskeletal: Mild degenerative changes of the lumbar spine. Left iliac metastasis (series 2/image 82), better evaluated on prior PET. Additional known metastasis involving the left iliac crest is not well visualized on CT. IMPRESSION: Radiation changes in the medial right upper lobe and right infrahilar region, as above. Bilateral pulmonary nodules/metastases, overall grossly unchanged. Hepatic metastases are mildly improved. Left adrenal metastasis is likely grossly unchanged. Known osseous metastases are poorly evaluated on CT. Electronically Signed   By: Julian Hy M.D.   On: 07/09/2018 08:56   Nm Bone Scan Whole Body  Result Date: 07/09/2018 CLINICAL DATA:  62 year old with current  history of metastatic RIGHT UPPER LOBE lung cancer. Greater than 1 month history of LEFT shoulder pain. EXAM: NUCLEAR MEDICINE WHOLE BODY BONE SCAN TECHNIQUE: Whole body anterior and posterior images were obtained approximately 3 hours after intravenous injection of radiopharmaceutical. RADIOPHARMACEUTICALS:  22.7 mCi Technetium-94m MDP IV. COMPARISON:  No prior bone scan. Bone window images from CT chest, abdomen and pelvis obtained subsequently same day. PET-CT 03/20/2018. FINDINGS: Multiple foci of abnormal activity representing osseous metastatic disease including: LOWER sternum, LEFT acromion, RIGHT ANTERIOR seventh rib, LEFT iliac bone, LEFT INFERIOR pubic ramus and both proximal femurs. A tiny focus of increased activity at the cervicothoracic junction is likely degenerative. IMPRESSION: Multiple osseous metastases as detailed above. Electronically Signed   By: Evangeline Dakin M.D.   On: 07/09/2018 08:40   Ct Abdomen Pelvis W Contrast  Result Date: 07/09/2018 CLINICAL DATA:  Progressive stage IV right lung cancer  EXAM: CT CHEST, ABDOMEN, AND PELVIS WITH CONTRAST TECHNIQUE: Multidetector CT imaging of the chest, abdomen and pelvis was performed following the standard protocol during bolus administration of intravenous contrast. CONTRAST:  124mL ISOVUE-300 IOPAMIDOL (ISOVUE-300) INJECTION 61% COMPARISON:  PET-CT dated 03/20/2018.  CT chest dated 03/06/2018. FINDINGS: CT CHEST FINDINGS Cardiovascular: Heart is normal in size.  No pericardial effusion. No evidence of thoracic aortic aneurysm. Atherosclerotic calcifications of the aortic arch. Mediastinum/Nodes: No suspicious mediastinal, hilar, or axillary lymphadenopathy. Visualized thyroid is notable for an 8 mm short axis left thyroid nodule. Lungs/Pleura: Radiation changes in the medial right upper lobe and right infrahilar region. Additional mild radiation changes in the left paramediastinal region. 9 mm cavitary nodule/metastasis in the posterior right  lower lobe (series 3/image 88), previously 10 mm. 6 mm nodule at the left lung base (series 3/image 83), previously 5 mm. Additional small nodules bilaterally. No focal consolidation. No pleural effusion or pneumothorax. Musculoskeletal: Degenerative changes of the thoracic spine. Known metastasis involving the left acromion is not well visualized on CT. CT ABDOMEN PELVIS FINDINGS Hepatobiliary: 1.8 cm metastasis in segment 7 (series 2/image 43), previously 2.6 cm. Additional 1.3 cm metastasis in the central right liver (series 2/image 46), previously 1.7 cm. Gallbladder is underdistended but unremarkable. No intrahepatic or extrahepatic ductal dilatation. Pancreas: Within normal limits. Spleen: Within normal limits. 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 Right adrenal glands within normal limits. Kidneys are within normal limits.  No hydronephrosis. Bladder is within normal limits. Stomach/Bowel: Stomach is within normal limits. No evidence of bowel obstruction. Appendix is poorly visualized. Vascular/Lymphatic: No evidence of abdominal aortic aneurysm. Atherosclerotic calcifications of the abdominal aorta and branch vessels. No suspicious abdominopelvic lymphadenopathy. Reproductive: Status post hysterectomy. No adnexal masses. Other: No abdominopelvic ascites. Musculoskeletal: Mild degenerative changes of the lumbar spine. Left iliac metastasis (series 2/image 82), better evaluated on prior PET. Additional known metastasis involving the left iliac crest is not well visualized on CT. IMPRESSION: Radiation changes in the medial right upper lobe and right infrahilar region, as above. Bilateral pulmonary nodules/metastases, overall grossly unchanged. Hepatic metastases are mildly improved. Left adrenal metastasis is likely grossly unchanged. Known osseous metastases are poorly evaluated on CT. Electronically Signed   By: Julian Hy  M.D.   On: 07/09/2018 08:56    PERFORMANCE STATUS (ECOG) : 1 - Symptomatic but completely ambulatory  Review of Systems As noted above. Otherwise, a complete review of systems is negative.  Physical Exam General: NAD, frail appearing, thin Pulmonary: unlabored Extremities: no edema, no joint deformities Skin: no rashes Neurological: Weakness but otherwise nonfocal  IMPRESSION: Follow up visit today with patient in the infusion suite. She denies changes or concerns.   She is comfortable appearing and denies any distressing symptoms today. Patient says her pain is well controlled. She continues to perform the exercises taught to her by OT. She has subsequently had improvement in her upper ext mobility.   Patient seems to be in bright spirits. She reports receiving good news today from Dr. Grayland Ormond. She says she is coping well with her illness.   We have previously discussed ACP and patient was interested in establishing HCPOA and was considering decision making. Will f/u on this conversation with she is next seen in the clinic.   PLAN: Continue oxycodone as needed for pain Recommend completion of healthcare power of attorney/living will/MOST form RTC in 1 month  Patient expressed understanding and was in agreement with this  plan. She also understands that She can call clinic at any time with any questions, concerns, or complaints.    Time Total: 15 minutes  Visit consisted of counseling and education dealing with the complex and emotionally intense issues of symptom management and palliative care in the setting of serious and potentially life-threatening illness.Greater than 50%  of this time was spent counseling and coordinating care related to the above assessment and plan.  Signed by: Altha Harm, [place], NP-C, [place] (Work Cell)

## 2018-07-15 LAB — THYROID PANEL WITH TSH
Free Thyroxine Index: 2.2 (ref 1.2–4.9)
T3 Uptake Ratio: 24 % (ref 24–39)
T4, Total: 9.3 ug/dL (ref 4.5–12.0)
TSH: 7.51 u[IU]/mL — ABNORMAL HIGH (ref 0.450–4.500)

## 2018-07-16 ENCOUNTER — Ambulatory Visit
Admission: RE | Admit: 2018-07-16 | Discharge: 2018-07-16 | Disposition: A | Discharge status: Home / Self Care | Payer: Managed Care, Other (non HMO) | Source: Ambulatory Visit | Attending: Family Medicine | Admitting: Family Medicine

## 2018-07-16 DIAGNOSIS — Z1231 Encounter for screening mammogram for malignant neoplasm of breast: Secondary | ICD-10-CM | POA: Diagnosis not present

## 2018-07-16 HISTORY — DX: Personal history of irradiation: Z92.3

## 2018-07-16 HISTORY — DX: Personal history of antineoplastic chemotherapy: Z92.21

## 2018-07-31 ENCOUNTER — Other Ambulatory Visit: Payer: Self-pay | Admitting: Oncology

## 2018-07-31 NOTE — Progress Notes (Signed)
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    INTERVAL HISTORY: Patient returns to clinic today for further evaluation and consideration of cycle 7 of carboplatinum, pemetrexed, and Avastin.  She continues to feel relatively well.  She does not complain of pain today.  She continues to have chronic weakness and fatigue.  She has no neurologic complaints.  She denies any recent fevers or illnesses. She denies any chest pain, shortness of breath, hemoptysis, or cough.  She has no nausea, vomiting, constipation, or diarrhea.  She has no melena or hematochezia.  She has no urinary complaints.  Patient offers no further specific complaints today.  REVIEW OF SYSTEMS:   Review of Systems  Constitutional: Positive for malaise/fatigue. Negative for fever and weight loss.  Respiratory: Negative.  Negative for cough and shortness of breath.   Cardiovascular: Negative.  Negative for chest pain and leg swelling.  Gastrointestinal: Negative.  Negative for abdominal pain, blood in stool and melena.  Genitourinary: Negative.  Negative for dysuria and flank pain.  Musculoskeletal: Negative for back pain and joint pain.  Skin: Negative.  Negative for rash.  Neurological: Positive for weakness. Negative for sensory change, focal weakness and headaches.  Psychiatric/Behavioral: Negative.  The patient is not nervous/anxious.     As per HPI. Otherwise, a complete review of systems is negative.  PAST MEDICAL HISTORY: Past Medical History:  Diagnosis Date  . Arthritis    right shoulder  . Cancer of right lung ([place]) 08/2017   Chemo + rad tx's.   Marland Kitchen Hyperlipidemia   .  Hypertension   . Hypothyroidism   . Menopausal state   . Personal history of chemotherapy 2019   lung ca  . Personal history of radiation therapy 2019   lung ca  . Thyroid disease     PAST SURGICAL HISTORY: Past Surgical History:  Procedure Laterality Date  . ABDOMINAL HYSTERECTOMY  2005  . BREAST EXCISIONAL BIOPSY Right 1979   benign  . COLONOSCOPY WITH PROPOFOL N/A 05/13/2017   Procedure: COLONOSCOPY WITH PROPOFOL;  Surgeon: [name], MD;  Location: [facility] ENDOSCOPY;  Service: Gastroenterology;  Laterality: N/A;  . CYSTECTOMY Right    breast  . CYSTECTOMY  02/2015   back of neck  . ESOPHAGOGASTRODUODENOSCOPY (EGD) WITH PROPOFOL N/A 05/13/2017   Procedure: ESOPHAGOGASTRODUODENOSCOPY (EGD) WITH PROPOFOL;  Surgeon: [name], MD;  Location: [facility] ENDOSCOPY;  Service: Gastroenterology;  Laterality: N/A;  . PORTA CATH INSERTION N/A 09/01/2017   Procedure: PORTA CATH INSERTION;  Surgeon: Algernon Huxley, MD;  Location: [facility];  Service: Cardiovascular;  Laterality: N/A;  . TUBAL LIGATION  1986    FAMILY HISTORY: Family History  Problem Relation Age of Onset  . Hypertension Mother   . Stroke Mother   . Leukemia Mother   . Stroke Father   . Pneumonia Father   . Diabetes Sister   . Hyperlipidemia Sister   . Breast cancer Maternal Aunt 28    ADVANCED DIRECTIVES (Y/N):  N  HEALTH MAINTENANCE: Social History   Tobacco Use  . Smoking status: Former Smoker    Packs/day: 0.25    Types: Cigarettes    Last attempt to quit: 02/01/2017    Years  since quitting: 1.5  . Smokeless tobacco: Never Used  Substance Use Topics  . Alcohol use: No    Alcohol/week: 0.0 standard drinks  . Drug use: No     Colonoscopy:  PAP:  Bone density:  Lipid panel:  No Known Allergies  Current Outpatient Medications  Medication Sig Dispense Refill  . amLODipine (NORVASC) 5 MG tablet Take 1 tablet (5 mg total) by mouth daily. 90 tablet 1  . folic acid (FOLVITE) 1 MG tablet Take 1  tablet (1 mg total) by mouth daily. 90 tablet 2  . levothyroxine (SYNTHROID, LEVOTHROID) 50 MCG tablet TAKE 1 TABLET DAILY 90 tablet 3  . lidocaine-prilocaine (EMLA) cream APP EXT AA 1 TIME  3  . atorvastatin (LIPITOR) 10 MG tablet TAKE 1 TABLET DAILY AT 6 P.M. (Patient not taking: Reported on 06/23/2018) 90 tablet 4  . Oxycodone HCl 10 MG TABS Take 1 tablet (10 mg total) by mouth every 6 (six) hours as needed. (Patient not taking: Reported on 06/23/2018) 120 tablet 0   No current facility-administered medications for this visit.     OBJECTIVE: Vitals:   08/04/18 0853  BP: (!) 162/80  Pulse: 89  Resp: 18  Temp: (!) 97.5 F (36.4 C)     Body mass index is 24.04 kg/m.    ECOG FS:0 - Asymptomatic  General: Well-developed, well-nourished, no acute distress. Eyes: Pink conjunctiva, anicteric sclera. HEENT: Normocephalic, moist mucous membranes. Lungs: Clear to auscultation bilaterally. Heart: Regular rate and rhythm. No rubs, murmurs, or gallops. Abdomen: Soft, nontender, nondistended. No organomegaly noted, normoactive bowel sounds. Musculoskeletal: No edema, cyanosis, or clubbing. Neuro: Alert, answering all questions appropriately. Cranial nerves grossly intact. Skin: No rashes or petechiae noted. Psych: Normal affect.  LAB RESULTS:  Lab Results  Component Value Date   NA 140 08/04/2018   K 3.4 (L) 08/04/2018   CL 106 08/04/2018   CO2 28 08/04/2018   GLUCOSE 83 08/04/2018   BUN 11 08/04/2018   CREATININE 0.79 08/04/2018   CALCIUM 8.9 08/04/2018   PROT 7.4 08/04/2018   ALBUMIN 4.0 08/04/2018   AST 27 08/04/2018   ALT 19 08/04/2018   ALKPHOS 75 08/04/2018   BILITOT 0.4 08/04/2018   GFRNONAA >60 08/04/2018   GFRAA >60 08/04/2018    Lab Results  Component Value Date   WBC 2.2 (L) 08/04/2018   NEUTROABS 1.2 (L) 08/04/2018   HGB 8.9 (L) 08/04/2018   HCT 27.0 (L) 08/04/2018   MCV 103.4 (H) 08/04/2018   PLT 159 08/04/2018     STUDIES: Ct Chest W  Contrast  Result Date: 07/09/2018 CLINICAL DATA:  Progressive stage IV right lung cancer EXAM: CT CHEST, ABDOMEN, AND PELVIS WITH CONTRAST TECHNIQUE: Multidetector CT imaging of the chest, abdomen and pelvis was performed following the standard protocol during bolus administration of intravenous contrast. CONTRAST:  186mL ISOVUE-300 IOPAMIDOL (ISOVUE-300) INJECTION 61% COMPARISON:  PET-CT dated 03/20/2018.  CT chest dated 03/06/2018. FINDINGS: CT CHEST FINDINGS Cardiovascular: Heart is normal in size.  No pericardial effusion. No evidence of thoracic aortic aneurysm. Atherosclerotic calcifications of the aortic arch. Mediastinum/Nodes: No suspicious mediastinal, hilar, or axillary lymphadenopathy. Visualized thyroid is notable for an 8 mm short axis left thyroid nodule. Lungs/Pleura: Radiation changes in the medial right upper lobe and right infrahilar region. Additional mild radiation changes in the left paramediastinal region. 9 mm cavitary nodule/metastasis in the posterior right lower lobe (series 3/image 88), previously 10 mm. 6 mm nodule at the left lung base (series 3/image 83), previously 5  mm. Additional small nodules bilaterally. No focal consolidation. No pleural effusion or pneumothorax. Musculoskeletal: Degenerative changes of the thoracic spine. Known metastasis involving the left acromion is not well visualized on CT. CT ABDOMEN PELVIS FINDINGS Hepatobiliary: 1.8 cm metastasis in segment 7 (series 2/image 43), previously 2.6 cm. Additional 1.3 cm metastasis in the central right liver (series 2/image 46), previously 1.7 cm. Gallbladder is underdistended but unremarkable. No intrahepatic or extrahepatic ductal dilatation. Pancreas: Within normal limits. Spleen: Within normal limits. 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 Right adrenal glands within normal limits. Kidneys are within normal limits.  No hydronephrosis. Bladder is  within normal limits. Stomach/Bowel: Stomach is within normal limits. No evidence of bowel obstruction. Appendix is poorly visualized. Vascular/Lymphatic: No evidence of abdominal aortic aneurysm. Atherosclerotic calcifications of the abdominal aorta and branch vessels. No suspicious abdominopelvic lymphadenopathy. Reproductive: Status post hysterectomy. No adnexal masses. Other: No abdominopelvic ascites. Musculoskeletal: Mild degenerative changes of the lumbar spine. Left iliac metastasis (series 2/image 82), better evaluated on prior PET. Additional known metastasis involving the left iliac crest is not well visualized on CT. IMPRESSION: Radiation changes in the medial right upper lobe and right infrahilar region, as above. Bilateral pulmonary nodules/metastases, overall grossly unchanged. Hepatic metastases are mildly improved. Left adrenal metastasis is likely grossly unchanged. Known osseous metastases are poorly evaluated on CT. Electronically Signed   By: Julian Hy M.D.   On: 07/09/2018 08:56   Nm Bone Scan Whole Body  Result Date: 07/09/2018 CLINICAL DATA:  62 year old with current history of metastatic RIGHT UPPER LOBE lung cancer. Greater than 1 month history of LEFT shoulder pain. EXAM: NUCLEAR MEDICINE WHOLE BODY BONE SCAN TECHNIQUE: Whole body anterior and posterior images were obtained approximately 3 hours after intravenous injection of radiopharmaceutical. RADIOPHARMACEUTICALS:  22.7 mCi Technetium-78m MDP IV. COMPARISON:  No prior bone scan. Bone window images from CT chest, abdomen and pelvis obtained subsequently same day. PET-CT 03/20/2018. FINDINGS: Multiple foci of abnormal activity representing osseous metastatic disease including: LOWER sternum, LEFT acromion, RIGHT ANTERIOR seventh rib, LEFT iliac bone, LEFT INFERIOR pubic ramus and both proximal femurs. A tiny focus of increased activity at the cervicothoracic junction is likely degenerative. IMPRESSION: Multiple osseous  metastases as detailed above. Electronically Signed   By: Evangeline Dakin M.D.   On: 07/09/2018 08:40   Ct Abdomen Pelvis W Contrast  Result Date: 07/09/2018 CLINICAL DATA:  Progressive stage IV right lung cancer EXAM: CT CHEST, ABDOMEN, AND PELVIS WITH CONTRAST TECHNIQUE: Multidetector CT imaging of the chest, abdomen and pelvis was performed following the standard protocol during bolus administration of intravenous contrast. CONTRAST:  168mL ISOVUE-300 IOPAMIDOL (ISOVUE-300) INJECTION 61% COMPARISON:  PET-CT dated 03/20/2018.  CT chest dated 03/06/2018. FINDINGS: CT CHEST FINDINGS Cardiovascular: Heart is normal in size.  No pericardial effusion. No evidence of thoracic aortic aneurysm. Atherosclerotic calcifications of the aortic arch. Mediastinum/Nodes: No suspicious mediastinal, hilar, or axillary lymphadenopathy. Visualized thyroid is notable for an 8 mm short axis left thyroid nodule. Lungs/Pleura: Radiation changes in the medial right upper lobe and right infrahilar region. Additional mild radiation changes in the left paramediastinal region. 9 mm cavitary nodule/metastasis in the posterior right lower lobe (series 3/image 88), previously 10 mm. 6 mm nodule at the left lung base (series 3/image 83), previously 5 mm. Additional small nodules bilaterally. No focal consolidation. No pleural effusion or pneumothorax. Musculoskeletal: Degenerative changes of the thoracic spine. Known metastasis involving the left acromion is not well visualized  on CT. CT ABDOMEN PELVIS FINDINGS Hepatobiliary: 1.8 cm metastasis in segment 7 (series 2/image 43), previously 2.6 cm. Additional 1.3 cm metastasis in the central right liver (series 2/image 46), previously 1.7 cm. Gallbladder is underdistended but unremarkable. No intrahepatic or extrahepatic ductal dilatation. Pancreas: Within normal limits. Spleen: Within normal limits. 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 Right adrenal glands within normal limits. Kidneys are within normal limits.  No hydronephrosis. Bladder is within normal limits. Stomach/Bowel: Stomach is within normal limits. No evidence of bowel obstruction. Appendix is poorly visualized. Vascular/Lymphatic: No evidence of abdominal aortic aneurysm. Atherosclerotic calcifications of the abdominal aorta and branch vessels. No suspicious abdominopelvic lymphadenopathy. Reproductive: Status post hysterectomy. No adnexal masses. Other: No abdominopelvic ascites. Musculoskeletal: Mild degenerative changes of the lumbar spine. Left iliac metastasis (series 2/image 82), better evaluated on prior PET. Additional known metastasis involving the left iliac crest is not well visualized on CT. IMPRESSION: Radiation changes in the medial right upper lobe and right infrahilar region, as above. Bilateral pulmonary nodules/metastases, overall grossly unchanged. Hepatic metastases are mildly improved. Left adrenal metastasis is likely grossly unchanged. Known osseous metastases are poorly evaluated on CT. Electronically Signed   By: Julian Hy M.D.   On: 07/09/2018 08:56   Mm 3d Screen Breast Bilateral  Result Date: 07/16/2018 CLINICAL DATA:  Screening. EXAM: DIGITAL SCREENING BILATERAL MAMMOGRAM WITH TOMO AND CAD COMPARISON:  Previous exam(s). ACR Breast Density Category b: There are scattered areas of fibroglandular density. FINDINGS: There are no findings suspicious for malignancy. Images were processed with CAD. IMPRESSION: No mammographic evidence of malignancy. A result letter of this screening mammogram will be mailed directly to the patient. RECOMMENDATION: Screening mammogram in one year. (Code:SM-B-01Y) BI-RADS CATEGORY  1: Negative. 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     PLAN:    1.  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  Nuclear med bone scan on the same day was essentially unchanged.  OmniSeq testing did not reveal any actionable mutations.  Patient wishes to continue with palliative treatment.  Proceed with cycle 7 of carboplatinum, pemetrexed, and Avastin.  Patient will also receive Zometa on the odd-numbered cycles.  She will require B12 injections and folate supplement along with her pemetrexed.  Plan to do a total of 8 cycles and then switch patient to maintenance pemetrexed and Avastin.  Return to clinic in 3 weeks for further evaluation and consideration of cycle 9. 2.  Neutropenia: Patient's ANC is chronically decreased, but stable at 1.2.  Proceed with treatment as above, but patient may require Neulasta for future treatments.  3.  Anemia: Hemoglobin slowly trending down at 8.9.  Monitor. 4.  Pain: Patient does not complain of this today.  Improved with XRT.  Continue current narcotic regimen. 5.  Palliative care: Appreciate input. 6.  Left shoulder mobility: Appreciate rehab input.  Patient expressed understanding and was in agreement with this plan. She also understands that She can call clinic at any time with any questions, concerns, or complaints.   Cancer Staging Cancer of upper lobe of right lung [facility]) Staging form: Lung, AJCC 8th Edition - Clinical  stage from 08/24/2017: Stage IV (cT2a, cN3, cM1c) - Signed by Lloyd Huger, MD on 03/29/2018   Lloyd Huger, MD   08/05/2018 8:56 AM

## 2018-08-04 ENCOUNTER — Inpatient Hospital Stay (HOSPITAL_BASED_OUTPATIENT_CLINIC_OR_DEPARTMENT_OTHER): Payer: Managed Care, Other (non HMO) | Admitting: Hospice and Palliative Medicine

## 2018-08-04 ENCOUNTER — Inpatient Hospital Stay: Payer: Managed Care, Other (non HMO)

## 2018-08-04 ENCOUNTER — Inpatient Hospital Stay (HOSPITAL_BASED_OUTPATIENT_CLINIC_OR_DEPARTMENT_OTHER): Payer: Managed Care, Other (non HMO) | Admitting: Oncology

## 2018-08-04 VITALS — BP 162/80 | HR 89 | Temp 97.5°F | Resp 18 | Wt 123.1 lb

## 2018-08-04 DIAGNOSIS — C3411 Malignant neoplasm of upper lobe, right bronchus or lung: Secondary | ICD-10-CM

## 2018-08-04 DIAGNOSIS — C787 Secondary malignant neoplasm of liver and intrahepatic bile duct: Secondary | ICD-10-CM

## 2018-08-04 DIAGNOSIS — R531 Weakness: Secondary | ICD-10-CM

## 2018-08-04 DIAGNOSIS — T451X5S Adverse effect of antineoplastic and immunosuppressive drugs, sequela: Secondary | ICD-10-CM

## 2018-08-04 DIAGNOSIS — E039 Hypothyroidism, unspecified: Secondary | ICD-10-CM

## 2018-08-04 DIAGNOSIS — C7972 Secondary malignant neoplasm of left adrenal gland: Secondary | ICD-10-CM

## 2018-08-04 DIAGNOSIS — Z79899 Other long term (current) drug therapy: Secondary | ICD-10-CM

## 2018-08-04 DIAGNOSIS — M1990 Unspecified osteoarthritis, unspecified site: Secondary | ICD-10-CM

## 2018-08-04 DIAGNOSIS — Z515 Encounter for palliative care: Secondary | ICD-10-CM

## 2018-08-04 DIAGNOSIS — Z803 Family history of malignant neoplasm of breast: Secondary | ICD-10-CM

## 2018-08-04 DIAGNOSIS — Z87891 Personal history of nicotine dependence: Secondary | ICD-10-CM

## 2018-08-04 DIAGNOSIS — Z923 Personal history of irradiation: Secondary | ICD-10-CM

## 2018-08-04 DIAGNOSIS — R52 Pain, unspecified: Secondary | ICD-10-CM

## 2018-08-04 DIAGNOSIS — C7801 Secondary malignant neoplasm of right lung: Secondary | ICD-10-CM | POA: Diagnosis not present

## 2018-08-04 DIAGNOSIS — E785 Hyperlipidemia, unspecified: Secondary | ICD-10-CM

## 2018-08-04 DIAGNOSIS — I10 Essential (primary) hypertension: Secondary | ICD-10-CM

## 2018-08-04 DIAGNOSIS — Z90710 Acquired absence of both cervix and uterus: Secondary | ICD-10-CM

## 2018-08-04 DIAGNOSIS — C7951 Secondary malignant neoplasm of bone: Secondary | ICD-10-CM | POA: Diagnosis not present

## 2018-08-04 DIAGNOSIS — C3490 Malignant neoplasm of unspecified part of unspecified bronchus or lung: Secondary | ICD-10-CM

## 2018-08-04 DIAGNOSIS — C786 Secondary malignant neoplasm of retroperitoneum and peritoneum: Secondary | ICD-10-CM

## 2018-08-04 DIAGNOSIS — D649 Anemia, unspecified: Secondary | ICD-10-CM

## 2018-08-04 DIAGNOSIS — R5381 Other malaise: Secondary | ICD-10-CM

## 2018-08-04 DIAGNOSIS — D701 Agranulocytosis secondary to cancer chemotherapy: Secondary | ICD-10-CM

## 2018-08-04 DIAGNOSIS — R5382 Chronic fatigue, unspecified: Secondary | ICD-10-CM

## 2018-08-04 LAB — CBC WITH DIFFERENTIAL/PLATELET
Abs Immature Granulocytes: 0.02 10*3/uL (ref 0.00–0.07)
BASOS PCT: 1 %
Basophils Absolute: 0 10*3/uL (ref 0.0–0.1)
Eosinophils Absolute: 0 10*3/uL (ref 0.0–0.5)
Eosinophils Relative: 1 %
HCT: 27 % — ABNORMAL LOW (ref 36.0–46.0)
Hemoglobin: 8.9 g/dL — ABNORMAL LOW (ref 12.0–15.0)
Immature Granulocytes: 1 %
Lymphocytes Relative: 26 %
Lymphs Abs: 0.6 10*3/uL — ABNORMAL LOW (ref 0.7–4.0)
MCH: 34.1 pg — ABNORMAL HIGH (ref 26.0–34.0)
MCHC: 33 g/dL (ref 30.0–36.0)
MCV: 103.4 fL — ABNORMAL HIGH (ref 80.0–100.0)
Monocytes Absolute: 0.4 10*3/uL (ref 0.1–1.0)
Monocytes Relative: 17 %
NRBC: 0 % (ref 0.0–0.2)
Neutro Abs: 1.2 10*3/uL — ABNORMAL LOW (ref 1.7–7.7)
Neutrophils Relative %: 54 %
PLATELETS: 159 10*3/uL (ref 150–400)
RBC: 2.61 MIL/uL — ABNORMAL LOW (ref 3.87–5.11)
RDW: 15.4 % (ref 11.5–15.5)
WBC: 2.2 10*3/uL — ABNORMAL LOW (ref 4.0–10.5)

## 2018-08-04 LAB — COMPREHENSIVE METABOLIC PANEL
ALK PHOS: 75 U/L (ref 38–126)
ALT: 19 U/L (ref 0–44)
AST: 27 U/L (ref 15–41)
Albumin: 4 g/dL (ref 3.5–5.0)
Anion gap: 6 (ref 5–15)
BUN: 11 mg/dL (ref 8–23)
CO2: 28 mmol/L (ref 22–32)
Calcium: 8.9 mg/dL (ref 8.9–10.3)
Chloride: 106 mmol/L (ref 98–111)
Creatinine, Ser: 0.79 mg/dL (ref 0.44–1.00)
GFR calc Af Amer: >60 mL/min (ref >60)
GFR calc non Af Amer: >60 mL/min (ref >60)
Glucose, Bld: 83 mg/dL (ref 70–99)
Potassium: 3.4 mmol/L — ABNORMAL LOW (ref 3.5–5.1)
Sodium: 140 mmol/L (ref 135–145)
Total Bilirubin: 0.4 mg/dL (ref 0.3–1.2)
Total Protein: 7.4 g/dL (ref 6.5–8.1)

## 2018-08-04 LAB — PROTEIN, URINE, RANDOM: TOTAL PROTEIN, URINE: 14 mg/dL

## 2018-08-04 MED ORDER — SODIUM CHLORIDE 0.9 % IV SOLN
Freq: Once | INTRAVENOUS | Status: AC
  Administered 2018-08-04: 09:00:00 via INTRAVENOUS
  Filled 2018-08-04: qty 250

## 2018-08-04 MED ORDER — SODIUM CHLORIDE 0.9 % IV SOLN
800.0000 mg | Freq: Once | INTRAVENOUS | Status: AC
  Administered 2018-08-04: 800 mg via INTRAVENOUS
  Filled 2018-08-04: qty 32

## 2018-08-04 MED ORDER — SODIUM CHLORIDE 0.9 % IV SOLN
500.0000 mg/m2 | Freq: Once | INTRAVENOUS | Status: AC
  Administered 2018-08-04: 800 mg via INTRAVENOUS
  Filled 2018-08-04: qty 20

## 2018-08-04 MED ORDER — SODIUM CHLORIDE 0.9% FLUSH
10.0000 mL | INTRAVENOUS | Status: DC | PRN
  Administered 2018-08-04: 10 mL via INTRAVENOUS
  Filled 2018-08-04: qty 10

## 2018-08-04 MED ORDER — HEPARIN SOD (PORK) LOCK FLUSH 100 UNIT/ML IV SOLN
500.0000 [IU] | Freq: Once | INTRAVENOUS | Status: AC
  Administered 2018-08-04: 500 [IU] via INTRAVENOUS
  Filled 2018-08-04: qty 5

## 2018-08-04 MED ORDER — SODIUM CHLORIDE 0.9 % IV SOLN
440.0000 mg | Freq: Once | INTRAVENOUS | Status: AC
  Administered 2018-08-04: 440 mg via INTRAVENOUS
  Filled 2018-08-04: qty 44

## 2018-08-04 MED ORDER — SODIUM CHLORIDE 0.9 % IV SOLN
Freq: Once | INTRAVENOUS | Status: AC
  Administered 2018-08-04: 10:00:00 via INTRAVENOUS
  Filled 2018-08-04: qty 5

## 2018-08-04 MED ORDER — PALONOSETRON HCL INJECTION 0.25 MG/5ML
0.2500 mg | Freq: Once | INTRAVENOUS | Status: AC
  Administered 2018-08-04: 0.25 mg via INTRAVENOUS
  Filled 2018-08-04: qty 5

## 2018-08-04 MED ORDER — ZOLEDRONIC ACID 4 MG/100ML IV SOLN
4.0000 mg | Freq: Once | INTRAVENOUS | Status: AC
  Administered 2018-08-04: 4 mg via INTRAVENOUS
  Filled 2018-08-04: qty 100

## 2018-08-04 MED ORDER — CYANOCOBALAMIN 1000 MCG/ML IJ SOLN
1000.0000 ug | Freq: Once | INTRAMUSCULAR | Status: AC
  Administered 2018-08-04: 1000 ug via INTRAMUSCULAR
  Filled 2018-08-04: qty 1

## 2018-08-04 NOTE — Progress Notes (Signed)
[place]  Telephone:([phone] [phone]   Name: Janet [name] Date: 08/04/2018 MRN: [id number]  DOB: [birth year]  Patient Care Team: Volney American, PA-C as PCP - General (Family Medicine) [name], RN as Registered Nurse    REASON FOR CONSULTATION: Palliative Care consult requested for this 63 y.o. 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  Palliative care was asked to help support patient through treatment and address goals of care.   SOCIAL HISTORY:    Patient is married but separated.  Patient lives alone.  She has a son and daughter.  Patient used to work on an Designer, television/film set.  ADVANCE DIRECTIVES:  Does not have  CODE STATUS: Full code  PAST MEDICAL HISTORY: Past Medical History:  Diagnosis Date  . Arthritis    right shoulder  . Cancer of right lung ([place]) 08/2017   Chemo + rad tx's.   Marland Kitchen Hyperlipidemia   . Hypertension   . Hypothyroidism   . Menopausal state   . Personal history of chemotherapy 2019   lung ca  . Personal history of radiation therapy 2019   lung ca  . Thyroid disease     PAST SURGICAL HISTORY:  Past Surgical History:  Procedure Laterality Date  . ABDOMINAL HYSTERECTOMY  2005  . BREAST EXCISIONAL BIOPSY Right 1979   benign  . COLONOSCOPY WITH PROPOFOL N/A 05/13/2017   Procedure: COLONOSCOPY WITH PROPOFOL;  Surgeon: [name], MD;  Location: [facility] ENDOSCOPY;  Service: Gastroenterology;  Laterality: N/A;  . CYSTECTOMY Right    breast  . CYSTECTOMY  02/2015   back of neck  . ESOPHAGOGASTRODUODENOSCOPY (EGD) WITH PROPOFOL N/A 05/13/2017   Procedure: ESOPHAGOGASTRODUODENOSCOPY (EGD) WITH PROPOFOL;  Surgeon: [name], MD;  Location: [facility] ENDOSCOPY;  Service: Gastroenterology;  Laterality: N/A;  . PORTA CATH  INSERTION N/A 09/01/2017   Procedure: PORTA CATH INSERTION;  Surgeon: Algernon Huxley, MD;  Location: [place] CV LAB;  Service: Cardiovascular;  Laterality: N/A;  . TUBAL LIGATION  1986    HEMATOLOGY/ONCOLOGY HISTORY:    Cancer of upper lobe of right lung ([place])   07/09/2017 Initial Diagnosis    Cancer of upper lobe of right lung ([place])    04/01/2018 -  Chemotherapy    The patient had palonosetron (ALOXI) injection 0.25 mg, 0.25 mg, Intravenous,  Once, 7 of 12 cycles Administration: 0.25 mg (04/01/2018), 0.25 mg (04/22/2018), 0.25 mg (05/13/2018), 0.25 mg (06/03/2018), 0.25 mg (06/23/2018), 0.25 mg (07/14/2018), 0.25 mg (08/04/2018) bevacizumab (AVASTIN) 900 mg in sodium chloride 0.9 % 100 mL chemo infusion, 925 mg, Intravenous,  Once, 7 of 12 cycles Administration: 900 mg (04/01/2018), 900 mg (04/22/2018), 800 mg (06/03/2018), 800 mg (06/23/2018), 800 mg (07/14/2018), 800 mg (08/04/2018) PEMEtrexed (ALIMTA) 800 mg in sodium chloride 0.9 % 100 mL chemo infusion, 800 mg, Intravenous,  Once, 7 of 12 cycles Administration: 800 mg (04/01/2018), 800 mg (04/22/2018), 800 mg (05/13/2018), 800 mg (06/03/2018), 800 mg (06/23/2018), 800 mg (07/14/2018), 800 mg (08/04/2018) CARBOplatin (PARAPLATIN) 480 mg in sodium chloride 0.9 % 250 mL chemo infusion, 480 mg (100 % of original dose 480.5 mg), Intravenous,  Once, 7 of 8 cycles Dose modification:   (original dose 480.5 mg, Cycle 1) Administration: 480 mg (04/01/2018), 480 mg (04/22/2018), 480 mg (05/13/2018), 400 mg (06/03/2018), 440 mg (06/23/2018), 440 mg (07/14/2018), 440 mg (08/04/2018)  fosaprepitant (EMEND) 150 mg, dexamethasone (DECADRON) 12 mg in sodium chloride 0.9 % 145 mL IVPB, , Intravenous,  Once, 7 of 12 cycles Administration:  (04/01/2018),  (04/22/2018),  (05/13/2018),  (06/03/2018),  (06/23/2018),  (07/14/2018),  (08/04/2018)  for chemotherapy treatment.      ALLERGIES:  has No Known Allergies.  MEDICATIONS:  Current Outpatient Medications    Medication Sig Dispense Refill  . amLODipine (NORVASC) 5 MG tablet Take 1 tablet (5 mg total) by mouth daily. 90 tablet 1  . atorvastatin (LIPITOR) 10 MG tablet TAKE 1 TABLET DAILY AT 6 P.M. (Patient not taking: Reported on 06/23/2018) 90 tablet 4  . folic acid (FOLVITE) 1 MG tablet Take 1 tablet (1 mg total) by mouth daily. 90 tablet 2  . levothyroxine (SYNTHROID, LEVOTHROID) 50 MCG tablet TAKE 1 TABLET DAILY 90 tablet 3  . lidocaine-prilocaine (EMLA) cream APP EXT AA 1 TIME  3  . Oxycodone HCl 10 MG TABS Take 1 tablet (10 mg total) by mouth every 6 (six) hours as needed. (Patient not taking: Reported on 06/23/2018) 120 tablet 0   No current facility-administered medications for this visit.     VITAL SIGNS: LMP 08/08/2003 (Approximate) Comment: Hysterectomy 2004 There were no vitals filed for this visit.  Estimated body mass index is 24.04 kg/m as calculated from the following:   Height as of 06/02/18: 5' (1.524 m).   Weight as of an earlier encounter on 08/04/18: 123 lb 1.6 oz (55.8 kg).  LABS: CBC:    Component Value Date/Time   WBC 2.2 (L) 08/04/2018 0813   HGB 8.9 (L) 08/04/2018 0813   HGB 10.8 (L) 07/09/2017 1615   HCT 27.0 (L) 08/04/2018 0813   HCT 32.7 (L) 07/09/2017 1615   PLT 159 08/04/2018 0813   PLT 394 (H) 07/09/2017 1615   MCV 103.4 (H) 08/04/2018 0813   MCV 92 07/09/2017 1615   NEUTROABS 1.2 (L) 08/04/2018 0813   NEUTROABS 2.5 07/09/2017 1615   LYMPHSABS 0.6 (L) 08/04/2018 0813   LYMPHSABS 3.1 07/09/2017 1615   MONOABS 0.4 08/04/2018 0813   EOSABS 0.0 08/04/2018 0813   EOSABS 0.1 07/09/2017 1615   BASOSABS 0.0 08/04/2018 0813   BASOSABS 0.0 07/09/2017 1615   Comprehensive Metabolic Panel:    Component Value Date/Time   NA 140 08/04/2018 0813   NA 147 (H) 07/09/2017 1615   K 3.4 (L) 08/04/2018 0813   CL 106 08/04/2018 0813   CO2 28 08/04/2018 0813   BUN 11 08/04/2018 0813   BUN 8 07/09/2017 1615   CREATININE 0.79 08/04/2018 0813   GLUCOSE 83  08/04/2018 0813   CALCIUM 8.9 08/04/2018 0813   AST 27 08/04/2018 0813   ALT 19 08/04/2018 0813   ALKPHOS 75 08/04/2018 0813   BILITOT 0.4 08/04/2018 0813   BILITOT <0.2 07/09/2017 1615   PROT 7.4 08/04/2018 0813   PROT 6.6 07/09/2017 1615   ALBUMIN 4.0 08/04/2018 0813   ALBUMIN 4.3 07/09/2017 1615    RADIOGRAPHIC STUDIES: Ct Chest W Contrast  Result Date: 07/09/2018 CLINICAL DATA:  Progressive stage IV right lung cancer EXAM: CT CHEST, ABDOMEN, AND PELVIS WITH CONTRAST TECHNIQUE: Multidetector CT imaging of the chest, abdomen and pelvis was performed following the standard protocol during bolus administration of intravenous contrast. CONTRAST:  179mL ISOVUE-300 IOPAMIDOL (ISOVUE-300) INJECTION 61% COMPARISON:  PET-CT dated 03/20/2018.  CT chest dated 03/06/2018. FINDINGS: CT CHEST FINDINGS Cardiovascular: Heart is normal in size.  No pericardial effusion. No evidence of thoracic aortic aneurysm. Atherosclerotic calcifications of  the aortic arch. Mediastinum/Nodes: No suspicious mediastinal, hilar, or axillary lymphadenopathy. Visualized thyroid is notable for an 8 mm short axis left thyroid nodule. Lungs/Pleura: Radiation changes in the medial right upper lobe and right infrahilar region. Additional mild radiation changes in the left paramediastinal region. 9 mm cavitary nodule/metastasis in the posterior right lower lobe (series 3/image 88), previously 10 mm. 6 mm nodule at the left lung base (series 3/image 83), previously 5 mm. Additional small nodules bilaterally. No focal consolidation. No pleural effusion or pneumothorax. Musculoskeletal: Degenerative changes of the thoracic spine. Known metastasis involving the left acromion is not well visualized on CT. CT ABDOMEN PELVIS FINDINGS Hepatobiliary: 1.8 cm metastasis in segment 7 (series 2/image 43), previously 2.6 cm. Additional 1.3 cm metastasis in the central right liver (series 2/image 46), previously 1.7 cm. Gallbladder is underdistended but  unremarkable. No intrahepatic or extrahepatic ductal dilatation. Pancreas: Within normal limits. Spleen: Within normal limits. 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 Right adrenal glands within normal limits. Kidneys are within normal limits.  No hydronephrosis. Bladder is within normal limits. Stomach/Bowel: Stomach is within normal limits. No evidence of bowel obstruction. Appendix is poorly visualized. Vascular/Lymphatic: No evidence of abdominal aortic aneurysm. Atherosclerotic calcifications of the abdominal aorta and branch vessels. No suspicious abdominopelvic lymphadenopathy. Reproductive: Status post hysterectomy. No adnexal masses. Other: No abdominopelvic ascites. Musculoskeletal: Mild degenerative changes of the lumbar spine. Left iliac metastasis (series 2/image 82), better evaluated on prior PET. Additional known metastasis involving the left iliac crest is not well visualized on CT. IMPRESSION: Radiation changes in the medial right upper lobe and right infrahilar region, as above. Bilateral pulmonary nodules/metastases, overall grossly unchanged. Hepatic metastases are mildly improved. Left adrenal metastasis is likely grossly unchanged. Known osseous metastases are poorly evaluated on CT. Electronically Signed   By: Julian Hy M.D.   On: 07/09/2018 08:56   Nm Bone Scan Whole Body  Result Date: 07/09/2018 CLINICAL DATA:  62 year old with current history of metastatic RIGHT UPPER LOBE lung cancer. Greater than 1 month history of LEFT shoulder pain. EXAM: NUCLEAR MEDICINE WHOLE BODY BONE SCAN TECHNIQUE: Whole body anterior and posterior images were obtained approximately 3 hours after intravenous injection of radiopharmaceutical. RADIOPHARMACEUTICALS:  22.7 mCi Technetium-36m MDP IV. COMPARISON:  No prior bone scan. Bone window images from CT chest, abdomen and pelvis obtained subsequently same day. PET-CT 03/20/2018.  FINDINGS: Multiple foci of abnormal activity representing osseous metastatic disease including: LOWER sternum, LEFT acromion, RIGHT ANTERIOR seventh rib, LEFT iliac bone, LEFT INFERIOR pubic ramus and both proximal femurs. A tiny focus of increased activity at the cervicothoracic junction is likely degenerative. IMPRESSION: Multiple osseous metastases as detailed above. Electronically Signed   By: Evangeline Dakin M.D.   On: 07/09/2018 08:40   Ct Abdomen Pelvis W Contrast  Result Date: 07/09/2018 CLINICAL DATA:  Progressive stage IV right lung cancer EXAM: CT CHEST, ABDOMEN, AND PELVIS WITH CONTRAST TECHNIQUE: Multidetector CT imaging of the chest, abdomen and pelvis was performed following the standard protocol during bolus administration of intravenous contrast. CONTRAST:  167mL ISOVUE-300 IOPAMIDOL (ISOVUE-300) INJECTION 61% COMPARISON:  PET-CT dated 03/20/2018.  CT chest dated 03/06/2018. FINDINGS: CT CHEST FINDINGS Cardiovascular: Heart is normal in size.  No pericardial effusion. No evidence of thoracic aortic aneurysm. Atherosclerotic calcifications of the aortic arch. Mediastinum/Nodes: No suspicious mediastinal, hilar, or axillary lymphadenopathy. Visualized thyroid is notable for an 8 mm short axis left thyroid nodule. Lungs/Pleura: Radiation changes in the medial  right upper lobe and right infrahilar region. Additional mild radiation changes in the left paramediastinal region. 9 mm cavitary nodule/metastasis in the posterior right lower lobe (series 3/image 88), previously 10 mm. 6 mm nodule at the left lung base (series 3/image 83), previously 5 mm. Additional small nodules bilaterally. No focal consolidation. No pleural effusion or pneumothorax. Musculoskeletal: Degenerative changes of the thoracic spine. Known metastasis involving the left acromion is not well visualized on CT. CT ABDOMEN PELVIS FINDINGS Hepatobiliary: 1.8 cm metastasis in segment 7 (series 2/image 43), previously 2.6 cm.  Additional 1.3 cm metastasis in the central right liver (series 2/image 46), previously 1.7 cm. Gallbladder is underdistended but unremarkable. No intrahepatic or extrahepatic ductal dilatation. Pancreas: Within normal limits. Spleen: Within normal limits. 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 Right adrenal glands within normal limits. Kidneys are within normal limits.  No hydronephrosis. Bladder is within normal limits. Stomach/Bowel: Stomach is within normal limits. No evidence of bowel obstruction. Appendix is poorly visualized. Vascular/Lymphatic: No evidence of abdominal aortic aneurysm. Atherosclerotic calcifications of the abdominal aorta and branch vessels. No suspicious abdominopelvic lymphadenopathy. Reproductive: Status post hysterectomy. No adnexal masses. Other: No abdominopelvic ascites. Musculoskeletal: Mild degenerative changes of the lumbar spine. Left iliac metastasis (series 2/image 82), better evaluated on prior PET. Additional known metastasis involving the left iliac crest is not well visualized on CT. IMPRESSION: Radiation changes in the medial right upper lobe and right infrahilar region, as above. Bilateral pulmonary nodules/metastases, overall grossly unchanged. Hepatic metastases are mildly improved. Left adrenal metastasis is likely grossly unchanged. Known osseous metastases are poorly evaluated on CT. Electronically Signed   By: Julian Hy M.D.   On: 07/09/2018 08:56   Mm 3d Screen Breast Bilateral  Result Date: 07/16/2018 CLINICAL DATA:  Screening. EXAM: DIGITAL SCREENING BILATERAL MAMMOGRAM WITH TOMO AND CAD COMPARISON:  Previous exam(s). ACR Breast Density Category b: There are scattered areas of fibroglandular density. FINDINGS: There are no findings suspicious for malignancy. Images were processed with CAD. IMPRESSION: No mammographic evidence of malignancy. A result letter of this screening  mammogram will be mailed directly to the patient. RECOMMENDATION: Screening mammogram in one year. (Code:SM-B-01Y) BI-RADS CATEGORY  1: Negative. Electronically Signed   By: Dorise Bullion III M.D   On: 07/16/2018 11:13    PERFORMANCE STATUS (ECOG) : 1 - Symptomatic but completely ambulatory  Review of Systems As noted above. Otherwise, a complete review of systems is negative.  Physical Exam General: NAD, frail appearing, thin Pulmonary: CTA ant fields Cardiac: RRR ABD: soft, nttp Extremities: no edema, no joint deformities Skin: no rashes Neurological: Weakness but otherwise nonfocal  IMPRESSION: Follow up visit today with patient in the infusion suite. She denies changes or concerns.   She reports that pain is well controlled with use of prn oxycodone. She denies constipation or other adverse effects.   She says appetite is stable. Weight appears stable at 123lbs over past month but is overall down from 130lbs in August 2019. Follow weight trends.   Patient says she is coping well. She denies depressive symptoms. We talked about her plans for New Years.   We discussed decision making and establishing ACP. She is thinking about options.  PLAN: Continue oxycodone as needed for pain Recommend completion of healthcare power of attorney/living will/MOST form RTC in 1 month  Patient expressed understanding and was in agreement with this plan. She also understands that She can call clinic at any time  with any questions, concerns, or complaints.    Time Total: 15 minutes  Visit consisted of counseling and education dealing with the complex and emotionally intense issues of symptom management and palliative care in the setting of serious and potentially life-threatening illness.Greater than 50%  of this time was spent counseling and coordinating care related to the above assessment and plan.  Signed by: Altha Harm, PhD, DNP, NP-C, [facility] [phone] (Work Cell)

## 2018-08-04 NOTE — Progress Notes (Signed)
Patient denies any concerns today.  

## 2018-08-05 LAB — THYROID PANEL WITH TSH
Free Thyroxine Index: 2.7 (ref 1.2–4.9)
T3 Uptake Ratio: 26 % (ref 24–39)
T4, Total: 10.3 ug/dL (ref 4.5–12.0)
TSH: 7.83 u[IU]/mL — AB (ref 0.450–4.500)

## 2018-08-23 NOTE — Progress Notes (Signed)
[place]  [phone] [phone]  ID: Janet [name] OB: [birth year]  MR#: [id number]  GNO#:[id number]  Patient Care Team: Volney American, PA-C as PCP - General (Family Medicine) [name], RN as Registered Nurse  CHIEF COMPLAINT: Progressive stage IV adenocarcinoma of the lung with metastatic disease in bones and liver.    INTERVAL HISTORY: Patient returns to clinic today for further evaluation and consideration of cycle 8 of carboplatinum, pemetrexed, and Avastin.  She continues to tolerate her treatments well without significant side effects. She does not complain of pain today.  She continues to have chronic weakness and fatigue.  She has no neurologic complaints.  She denies any recent fevers or illnesses. She denies any chest pain, shortness of breath, hemoptysis, or cough.  She has no nausea, vomiting, constipation, or diarrhea.  She has no melena or hematochezia.  She has no urinary complaints.  Patient offers no specific complaints today.  REVIEW OF SYSTEMS:   Review of Systems  Constitutional: Positive for malaise/fatigue. Negative for fever and weight loss.  Respiratory: Negative.  Negative for cough and shortness of breath.   Cardiovascular: Negative.  Negative for chest pain and leg swelling.  Gastrointestinal: Negative.  Negative for abdominal pain, blood in stool and melena.  Genitourinary: Negative.  Negative for dysuria and flank pain.  Musculoskeletal: Negative for back pain and joint pain.  Skin: Negative.  Negative for rash.  Neurological: Positive for weakness. Negative for sensory change, focal weakness and headaches.  Psychiatric/Behavioral: Negative.  The patient is not nervous/anxious.     As per HPI. Otherwise, a complete review of systems is negative.  PAST MEDICAL HISTORY: Past Medical History:  Diagnosis Date  . Arthritis    right shoulder  . Cancer of right lung ([place]) 08/2017   Chemo + rad tx's.    Marland Kitchen Hyperlipidemia   . Hypertension   . Hypothyroidism   . Menopausal state   . Personal history of chemotherapy 2019   lung ca  . Personal history of radiation therapy 2019   lung ca  . Thyroid disease     PAST SURGICAL HISTORY: Past Surgical History:  Procedure Laterality Date  . ABDOMINAL HYSTERECTOMY  2005  . BREAST EXCISIONAL BIOPSY Right 1979   benign  . COLONOSCOPY WITH PROPOFOL N/A 05/13/2017   Procedure: COLONOSCOPY WITH PROPOFOL;  Surgeon: [name], MD;  Location[facility] ENDOSCOPY;  Service: Gastroenterology;  Laterality: N/A;  . CYSTECTOMY Right    breast  . CYSTECTOMY  02/2015   back of neck  . ESOPHAGOGASTRODUODENOSCOPY (EGD) WITH PROPOFOL N/A 05/13/2017   Procedure: ESOPHAGOGASTRODUODENOSCOPY (EGD) WITH PROPOFOL;  Surgeon: [name], MD;  Location: [facility] ENDOSCOPY;  Service: Gastroenterology;  Laterality: N/A;  . PORTA CATH INSERTION N/A 09/01/2017   Procedure: PORTA CATH INSERTION;  Surgeon: Algernon Huxley, MD;  Location: [place] CV LAB;  Service: Cardiovascular;  Laterality: N/A;  . TUBAL LIGATION  1986    FAMILY HISTORY: Family History  Problem Relation Age of Onset  . Hypertension Mother   . Stroke Mother   . Leukemia Mother   . Stroke Father   . Pneumonia Father   . Diabetes Sister   . Hyperlipidemia Sister   . Breast cancer Maternal Aunt 44    ADVANCED DIRECTIVES (Y/N):  N  HEALTH MAINTENANCE: Social History   Tobacco Use  . Smoking status: Former Smoker    Packs/day: 0.25    Types: Cigarettes    Last attempt to quit: 02/01/2017  Years since quitting: 1.5  . Smokeless tobacco: Never Used  Substance Use Topics  . Alcohol use: No    Alcohol/week: 0.0 standard drinks  . Drug use: No     Colonoscopy:  PAP:  Bone density:  Lipid panel:  No Known Allergies  Current Outpatient Medications  Medication Sig Dispense Refill  . amLODipine (NORVASC) 5 MG tablet Take 1 tablet (5 mg total) by mouth daily. 90 tablet 1  . atorvastatin  (LIPITOR) 10 MG tablet TAKE 1 TABLET DAILY AT 6 P.M. 90 tablet 4  . folic acid (FOLVITE) 1 MG tablet Take 1 tablet (1 mg total) by mouth daily. 90 tablet 2  . levothyroxine (SYNTHROID, LEVOTHROID) 50 MCG tablet TAKE 1 TABLET DAILY 90 tablet 3  . lidocaine-prilocaine (EMLA) cream APP EXT AA 1 TIME  3  . Oxycodone HCl 10 MG TABS Take 1 tablet (10 mg total) by mouth every 6 (six) hours as needed. 120 tablet 0  . gabapentin (NEURONTIN) 100 MG capsule Take 1 capsule (100 mg total) by mouth at bedtime. 30 capsule 0   No current facility-administered medications for this visit.     OBJECTIVE: Vitals:   08/25/18 0830 08/25/18 0849  BP: (!) 147/83   Pulse: 81   Temp: (!) 97.3 F (36.3 C)   SpO2:  98%     Body mass index is 23.57 kg/m.    ECOG FS:0 - Asymptomatic  General: Well-developed, well-nourished, no acute distress. Eyes: Pink conjunctiva, anicteric sclera. HEENT: Normocephalic, moist mucous membranes. Lungs: Clear to auscultation bilaterally. Heart: Regular rate and rhythm. No rubs, murmurs, or gallops. Abdomen: Soft, nontender, nondistended. No organomegaly noted, normoactive bowel sounds. Musculoskeletal: No edema, cyanosis, or clubbing. Neuro: Alert, answering all questions appropriately. Cranial nerves grossly intact. Skin: No rashes or petechiae noted. Psych: Normal affect.  LAB RESULTS:  Lab Results  Component Value Date   NA 141 08/25/2018   K 3.4 (L) 08/25/2018   CL 107 08/25/2018   CO2 27 08/25/2018   GLUCOSE 101 (H) 08/25/2018   BUN 9 08/25/2018   CREATININE 0.91 08/25/2018   CALCIUM 8.6 (L) 08/25/2018   PROT 7.3 08/25/2018   ALBUMIN 3.9 08/25/2018   AST 24 08/25/2018   ALT 15 08/25/2018   ALKPHOS 75 08/25/2018   BILITOT 0.2 (L) 08/25/2018   GFRNONAA >60 08/25/2018   GFRAA >60 08/25/2018    Lab Results  Component Value Date   WBC 3.1 (L) 08/25/2018   NEUTROABS 2.0 08/25/2018   HGB 8.7 (L) 08/25/2018   HCT 26.2 (L) 08/25/2018   MCV 105.2 (H)  08/25/2018   PLT 150 08/25/2018     STUDIES: No results found.  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     PLAN:    1. 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  Nuclear med bone scan on the same day was essentially unchanged.  OmniSeq testing did not reveal any actionable mutations.  Patient wishes to continue with palliative treatment.  Proceed with cycle 8 of carboplatinum, pemetrexed, and Avastin. Patient will also receive Zometa on the odd-numbered cycles.  She will require B12 injections and folate supplement along with her pemetrexed.  Patient will now transition to maintenance pemetrexed and Avastin.  Return to clinic in 3 weeks for further evaluation and consideration  of cycle 9.   2.  Neutropenia: Resolved. 3.  Anemia: Hemoglobin is slowly trending down and is now 8.7.  Monitor. 4.  Pain: Patient does not complain of this today.  Improved with XRT.  Continue current narcotic regimen. 5.  Palliative care: Appreciate input. 6.  Left shoulder mobility: Appreciate rehab input. 7.  Hypokalemia: Mild, monitor.  Patient expressed understanding and was in agreement with this plan. She also understands that She can call clinic at any time with any questions, concerns, or complaints.   Cancer Staging Cancer of upper lobe of right lung [facility]) Staging form: Lung, AJCC 8th Edition - Clinical stage from 08/24/2017: Stage IV (cT2a, cN3, cM1c) - Signed by Lloyd Huger, MD on 03/29/2018   Lloyd Huger, MD   08/26/2018 6:38 AM

## 2018-08-25 ENCOUNTER — Inpatient Hospital Stay (HOSPITAL_BASED_OUTPATIENT_CLINIC_OR_DEPARTMENT_OTHER): Payer: BLUE CROSS/BLUE SHIELD | Admitting: Oncology

## 2018-08-25 ENCOUNTER — Other Ambulatory Visit: Payer: Self-pay

## 2018-08-25 ENCOUNTER — Inpatient Hospital Stay: Payer: BLUE CROSS/BLUE SHIELD

## 2018-08-25 ENCOUNTER — Inpatient Hospital Stay: Payer: BLUE CROSS/BLUE SHIELD | Attending: Oncology

## 2018-08-25 ENCOUNTER — Inpatient Hospital Stay (HOSPITAL_BASED_OUTPATIENT_CLINIC_OR_DEPARTMENT_OTHER): Payer: BLUE CROSS/BLUE SHIELD | Admitting: Hospice and Palliative Medicine

## 2018-08-25 VITALS — BP 147/83 | HR 81 | Temp 97.3°F | Ht 60.0 in | Wt 120.7 lb

## 2018-08-25 DIAGNOSIS — Z87891 Personal history of nicotine dependence: Secondary | ICD-10-CM | POA: Diagnosis not present

## 2018-08-25 DIAGNOSIS — R64 Cachexia: Secondary | ICD-10-CM | POA: Diagnosis not present

## 2018-08-25 DIAGNOSIS — R531 Weakness: Secondary | ICD-10-CM

## 2018-08-25 DIAGNOSIS — Z79899 Other long term (current) drug therapy: Secondary | ICD-10-CM

## 2018-08-25 DIAGNOSIS — C787 Secondary malignant neoplasm of liver and intrahepatic bile duct: Secondary | ICD-10-CM | POA: Diagnosis not present

## 2018-08-25 DIAGNOSIS — C7951 Secondary malignant neoplasm of bone: Secondary | ICD-10-CM | POA: Insufficient documentation

## 2018-08-25 DIAGNOSIS — E039 Hypothyroidism, unspecified: Secondary | ICD-10-CM

## 2018-08-25 DIAGNOSIS — C3411 Malignant neoplasm of upper lobe, right bronchus or lung: Secondary | ICD-10-CM | POA: Diagnosis not present

## 2018-08-25 DIAGNOSIS — R5382 Chronic fatigue, unspecified: Secondary | ICD-10-CM

## 2018-08-25 DIAGNOSIS — E876 Hypokalemia: Secondary | ICD-10-CM | POA: Insufficient documentation

## 2018-08-25 DIAGNOSIS — Z5111 Encounter for antineoplastic chemotherapy: Secondary | ICD-10-CM | POA: Insufficient documentation

## 2018-08-25 DIAGNOSIS — R079 Chest pain, unspecified: Secondary | ICD-10-CM

## 2018-08-25 DIAGNOSIS — I10 Essential (primary) hypertension: Secondary | ICD-10-CM | POA: Diagnosis not present

## 2018-08-25 DIAGNOSIS — R5381 Other malaise: Secondary | ICD-10-CM

## 2018-08-25 DIAGNOSIS — E785 Hyperlipidemia, unspecified: Secondary | ICD-10-CM

## 2018-08-25 DIAGNOSIS — Z923 Personal history of irradiation: Secondary | ICD-10-CM | POA: Insufficient documentation

## 2018-08-25 DIAGNOSIS — G893 Neoplasm related pain (acute) (chronic): Secondary | ICD-10-CM | POA: Diagnosis not present

## 2018-08-25 DIAGNOSIS — Z515 Encounter for palliative care: Secondary | ICD-10-CM

## 2018-08-25 DIAGNOSIS — G629 Polyneuropathy, unspecified: Secondary | ICD-10-CM | POA: Diagnosis not present

## 2018-08-25 DIAGNOSIS — M1990 Unspecified osteoarthritis, unspecified site: Secondary | ICD-10-CM

## 2018-08-25 DIAGNOSIS — C3490 Malignant neoplasm of unspecified part of unspecified bronchus or lung: Secondary | ICD-10-CM

## 2018-08-25 DIAGNOSIS — Z5112 Encounter for antineoplastic immunotherapy: Secondary | ICD-10-CM | POA: Insufficient documentation

## 2018-08-25 LAB — CBC WITH DIFFERENTIAL/PLATELET
Abs Immature Granulocytes: 0.01 10*3/uL (ref 0.00–0.07)
Basophils Absolute: 0 10*3/uL (ref 0.0–0.1)
Basophils Relative: 0 %
EOS PCT: 0 %
Eosinophils Absolute: 0 10*3/uL (ref 0.0–0.5)
HCT: 26.2 % — ABNORMAL LOW (ref 36.0–46.0)
Hemoglobin: 8.7 g/dL — ABNORMAL LOW (ref 12.0–15.0)
Immature Granulocytes: 0 %
Lymphocytes Relative: 20 %
Lymphs Abs: 0.6 10*3/uL — ABNORMAL LOW (ref 0.7–4.0)
MCH: 34.9 pg — ABNORMAL HIGH (ref 26.0–34.0)
MCHC: 33.2 g/dL (ref 30.0–36.0)
MCV: 105.2 fL — ABNORMAL HIGH (ref 80.0–100.0)
Monocytes Absolute: 0.5 10*3/uL (ref 0.1–1.0)
Monocytes Relative: 15 %
Neutro Abs: 2 10*3/uL (ref 1.7–7.7)
Neutrophils Relative %: 65 %
Platelets: 150 10*3/uL (ref 150–400)
RBC: 2.49 MIL/uL — AB (ref 3.87–5.11)
RDW: 14.6 % (ref 11.5–15.5)
WBC: 3.1 10*3/uL — AB (ref 4.0–10.5)
nRBC: 0 % (ref 0.0–0.2)

## 2018-08-25 LAB — COMPREHENSIVE METABOLIC PANEL
ALT: 15 U/L (ref 0–44)
AST: 24 U/L (ref 15–41)
Albumin: 3.9 g/dL (ref 3.5–5.0)
Alkaline Phosphatase: 75 U/L (ref 38–126)
Anion gap: 7 (ref 5–15)
BILIRUBIN TOTAL: 0.2 mg/dL — AB (ref 0.3–1.2)
BUN: 9 mg/dL (ref 8–23)
CO2: 27 mmol/L (ref 22–32)
Calcium: 8.6 mg/dL — ABNORMAL LOW (ref 8.9–10.3)
Chloride: 107 mmol/L (ref 98–111)
Creatinine, Ser: 0.91 mg/dL (ref 0.44–1.00)
GFR calc non Af Amer: >60 mL/min (ref >60)
Glucose, Bld: 101 mg/dL — ABNORMAL HIGH (ref 70–99)
Potassium: 3.4 mmol/L — ABNORMAL LOW (ref 3.5–5.1)
Sodium: 141 mmol/L (ref 135–145)
TOTAL PROTEIN: 7.3 g/dL (ref 6.5–8.1)

## 2018-08-25 LAB — PROTEIN, URINE, RANDOM: Total Protein, Urine: 18 mg/dL

## 2018-08-25 MED ORDER — GABAPENTIN 100 MG PO CAPS: 100.0000 mg | ORAL_CAPSULE | Freq: Every day | ORAL | 0 refills | Status: DC

## 2018-08-25 MED ORDER — SODIUM CHLORIDE 0.9 % IV SOLN
Freq: Once | INTRAVENOUS | Status: AC
  Administered 2018-08-25: 10:00:00 via INTRAVENOUS
  Filled 2018-08-25: qty 5

## 2018-08-25 MED ORDER — HEPARIN SOD (PORK) LOCK FLUSH 100 UNIT/ML IV SOLN
500.0000 [IU] | Freq: Once | INTRAVENOUS | Status: AC
  Administered 2018-08-25: 500 [IU] via INTRAVENOUS
  Filled 2018-08-25: qty 5

## 2018-08-25 MED ORDER — SODIUM CHLORIDE 0.9 % IV SOLN
500.0000 mg/m2 | Freq: Once | INTRAVENOUS | Status: AC
  Administered 2018-08-25: 800 mg via INTRAVENOUS
  Filled 2018-08-25: qty 20

## 2018-08-25 MED ORDER — PALONOSETRON HCL INJECTION 0.25 MG/5ML
0.2500 mg | Freq: Once | INTRAVENOUS | Status: AC
  Administered 2018-08-25: 0.25 mg via INTRAVENOUS
  Filled 2018-08-25: qty 5

## 2018-08-25 MED ORDER — CYANOCOBALAMIN 1000 MCG/ML IJ SOLN
1000.0000 ug | Freq: Once | INTRAMUSCULAR | Status: AC
  Administered 2018-08-25: 1000 ug via INTRAMUSCULAR
  Filled 2018-08-25: qty 1

## 2018-08-25 MED ORDER — SODIUM CHLORIDE 0.9 % IV SOLN
433.5000 mg | Freq: Once | INTRAVENOUS | Status: AC
  Administered 2018-08-25: 430 mg via INTRAVENOUS
  Filled 2018-08-25: qty 43

## 2018-08-25 MED ORDER — SODIUM CHLORIDE 0.9 % IV SOLN
Freq: Once | INTRAVENOUS | Status: AC
  Administered 2018-08-25: 09:00:00 via INTRAVENOUS
  Filled 2018-08-25: qty 250

## 2018-08-25 MED ORDER — SODIUM CHLORIDE 0.9 % IV SOLN
800.0000 mg | Freq: Once | INTRAVENOUS | Status: AC
  Administered 2018-08-25: 800 mg via INTRAVENOUS
  Filled 2018-08-25: qty 32

## 2018-08-25 MED ORDER — SODIUM CHLORIDE 0.9% FLUSH
10.0000 mL | Freq: Once | INTRAVENOUS | Status: AC
  Administered 2018-08-25: 10 mL via INTRAVENOUS
  Filled 2018-08-25: qty 10

## 2018-08-25 NOTE — Progress Notes (Signed)
[place]  [phone] [phone]   Name: Janet [name] Date: 08/25/2018 MRN: [id number]  DOB: [birth year]  Patient Care Team: Volney American, PA-C as PCP - General (Family Medicine) [name], RN as Registered Nurse    REASON FOR CONSULTATION: Palliative Care consult requested for this 63 y.o. 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  Palliative care was asked to help support patient through treatment and address goals of care.   SOCIAL HISTORY:    Patient is married but separated.  Patient lives alone.  She has a son and daughter.  Patient used to work on an Designer, television/film set.  ADVANCE DIRECTIVES:  Does not have  CODE STATUS: Full code  PAST MEDICAL HISTORY: Past Medical History:  Diagnosis Date  . Arthritis    right shoulder  . Cancer of right lung ([place]) 08/2017   Chemo + rad tx's.   Marland Kitchen Hyperlipidemia   . Hypertension   . Hypothyroidism   . Menopausal state   . Personal history of chemotherapy 2019   lung ca  . Personal history of radiation therapy 2019   lung ca  . Thyroid disease     PAST SURGICAL HISTORY:  Past Surgical History:  Procedure Laterality Date  . ABDOMINAL HYSTERECTOMY  2005  . BREAST EXCISIONAL BIOPSY Right 1979   benign  . COLONOSCOPY WITH PROPOFOL N/A 05/13/2017   Procedure: COLONOSCOPY WITH PROPOFOL;  Surgeon: [name], MD;  Location: [facility] ENDOSCOPY;  Service: Gastroenterology;  Laterality: N/A;  . CYSTECTOMY Right    breast  . CYSTECTOMY  02/2015   back of neck  . ESOPHAGOGASTRODUODENOSCOPY (EGD) WITH PROPOFOL N/A 05/13/2017   Procedure: ESOPHAGOGASTRODUODENOSCOPY (EGD) WITH PROPOFOL;  Surgeon: [name], MD;  Location: [facility] ENDOSCOPY;  Service: Gastroenterology;  Laterality: N/A;  . PORTA CATH  INSERTION N/A 09/01/2017   Procedure: PORTA CATH INSERTION;  Surgeon: Algernon Huxley, MD;  Location: [place] CV LAB;  Service: Cardiovascular;  Laterality: N/A;  . TUBAL LIGATION  1986    HEMATOLOGY/ONCOLOGY HISTORY:    Cancer of upper lobe of right lung ([place])   07/09/2017 Initial Diagnosis    Cancer of upper lobe of right lung ([place])    04/01/2018 -  Chemotherapy    The patient had palonosetron (ALOXI) injection 0.25 mg, 0.25 mg, Intravenous,  Once, 8 of 12 cycles Administration: 0.25 mg (04/01/2018), 0.25 mg (04/22/2018), 0.25 mg (05/13/2018), 0.25 mg (06/03/2018), 0.25 mg (06/23/2018), 0.25 mg (07/14/2018), 0.25 mg (08/04/2018) bevacizumab (AVASTIN) 900 mg in sodium chloride 0.9 % 100 mL chemo infusion, 925 mg, Intravenous,  Once, 8 of 12 cycles Administration: 900 mg (04/01/2018), 900 mg (04/22/2018), 800 mg (06/03/2018), 800 mg (06/23/2018), 800 mg (07/14/2018), 800 mg (08/04/2018) PEMEtrexed (ALIMTA) 800 mg in sodium chloride 0.9 % 100 mL chemo infusion, 800 mg, Intravenous,  Once, 8 of 12 cycles Administration: 800 mg (04/01/2018), 800 mg (04/22/2018), 800 mg (05/13/2018), 800 mg (06/03/2018), 800 mg (06/23/2018), 800 mg (07/14/2018), 800 mg (08/04/2018) CARBOplatin (PARAPLATIN) 480 mg in sodium chloride 0.9 % 250 mL chemo infusion, 480 mg (100 % of original dose 480.5 mg), Intravenous,  Once, 8 of 8 cycles Dose modification:   (original dose 480.5 mg, Cycle 1) Administration: 480 mg (04/01/2018), 480 mg (04/22/2018), 480 mg (05/13/2018), 400 mg (06/03/2018), 440 mg (06/23/2018), 440 mg (07/14/2018), 440 mg (08/04/2018)  fosaprepitant (EMEND) 150 mg, dexamethasone (DECADRON) 12 mg in sodium chloride 0.9 % 145 mL IVPB, , Intravenous,  Once, 8 of 12 cycles Administration:  (04/01/2018),  (04/22/2018),  (05/13/2018),  (06/03/2018),  (06/23/2018),  (07/14/2018),  (08/04/2018)  for chemotherapy treatment.      ALLERGIES:  has No Known Allergies.  MEDICATIONS:  Current Outpatient Medications    Medication Sig Dispense Refill  . amLODipine (NORVASC) 5 MG tablet Take 1 tablet (5 mg total) by mouth daily. 90 tablet 1  . atorvastatin (LIPITOR) 10 MG tablet TAKE 1 TABLET DAILY AT 6 P.M. 90 tablet 4  . folic acid (FOLVITE) 1 MG tablet Take 1 tablet (1 mg total) by mouth daily. 90 tablet 2  . gabapentin (NEURONTIN) 100 MG capsule Take 1 capsule (100 mg total) by mouth at bedtime. 30 capsule 0  . levothyroxine (SYNTHROID, LEVOTHROID) 50 MCG tablet TAKE 1 TABLET DAILY 90 tablet 3  . lidocaine-prilocaine (EMLA) cream APP EXT AA 1 TIME  3  . Oxycodone HCl 10 MG TABS Take 1 tablet (10 mg total) by mouth every 6 (six) hours as needed. 120 tablet 0   No current facility-administered medications for this visit.    Facility-Administered Medications Ordered in Other Visits  Medication Dose Route Frequency Provider Last Rate Last Dose  . bevacizumab (AVASTIN) 800 mg in sodium chloride 0.9 % 100 mL chemo infusion  800 mg Intravenous Once Lloyd Huger, MD 264 mL/hr at 08/25/18 1005 800 mg at 08/25/18 1005  . CARBOplatin (PARAPLATIN) 430 mg in sodium chloride 0.9 % 250 mL chemo infusion  430 mg Intravenous Once Lloyd Huger, MD      . heparin lock flush 100 unit/mL  500 Units Intravenous Once Lloyd Huger, MD      . PEMEtrexed (ALIMTA) 800 mg in sodium chloride 0.9 % 100 mL chemo infusion  500 mg/m2 (Treatment Plan Recorded) Intravenous Once Lloyd Huger, MD        VITAL SIGNS: LMP 08/08/2003 (Approximate) Comment: Hysterectomy 2004 There were no vitals filed for this visit.  Estimated body mass index is 23.57 kg/m as calculated from the following:   Height as of an earlier encounter on 08/25/18: 5' (1.524 m).   Weight as of an earlier encounter on 08/25/18: 120 lb 11.2 oz (54.7 kg).  LABS: CBC:    Component Value Date/Time   WBC 3.1 (L) 08/25/2018 0807   HGB 8.7 (L) 08/25/2018 0807   HGB 10.8 (L) 07/09/2017 1615   HCT 26.2 (L) 08/25/2018 0807   HCT 32.7 (L)  07/09/2017 1615   PLT 150 08/25/2018 0807   PLT 394 (H) 07/09/2017 1615   MCV 105.2 (H) 08/25/2018 0807   MCV 92 07/09/2017 1615   NEUTROABS 2.0 08/25/2018 0807   NEUTROABS 2.5 07/09/2017 1615   LYMPHSABS 0.6 (L) 08/25/2018 0807   LYMPHSABS 3.1 07/09/2017 1615   MONOABS 0.5 08/25/2018 0807   EOSABS 0.0 08/25/2018 0807   EOSABS 0.1 07/09/2017 1615   BASOSABS 0.0 08/25/2018 0807   BASOSABS 0.0 07/09/2017 1615   Comprehensive Metabolic Panel:    Component Value Date/Time   NA 141 08/25/2018 0807   NA 147 (H) 07/09/2017 1615   K 3.4 (L) 08/25/2018 0807   CL 107 08/25/2018 0807   CO2 27 08/25/2018 0807   BUN 9 08/25/2018 0807   BUN 8 07/09/2017 1615   CREATININE 0.91 08/25/2018 0807   GLUCOSE 101 (H) 08/25/2018 0807   CALCIUM 8.6 (L) 08/25/2018 0807   AST  24 08/25/2018 0807   ALT 15 08/25/2018 0807   ALKPHOS 75 08/25/2018 0807   BILITOT 0.2 (L) 08/25/2018 0807   BILITOT <0.2 07/09/2017 1615   PROT 7.3 08/25/2018 0807   PROT 6.6 07/09/2017 1615   ALBUMIN 3.9 08/25/2018 0807   ALBUMIN 4.3 07/09/2017 1615    RADIOGRAPHIC STUDIES: No results found.  PERFORMANCE STATUS (ECOG) : 1 - Symptomatic but completely ambulatory  Review of Systems As noted above. Otherwise, a complete review of systems is negative.  Physical Exam General: NAD, frail appearing, thin Pulmonary: CTA ant fields Cardiac: RRR ABD: soft, nttp Extremities: no edema, no joint deformities Skin: no rashes Neurological: Weakness but otherwise nonfocal  IMPRESSION: Follow up visit today with patient in the infusion suite. She denies changes or concerns.   She reports that chest pain is well controlled with use of prn oxycodone. She says "what pain." However, she has had persistent numbness and tingling in BLEs that is likely treatment related. Neuropathy is not painful but is described as "irritating" and keeps her awake at night. Will start gabapentin qhs.   She says appetite is stable. Weight appears  stable at 120lbs over past month but is overall down from 130lbs in August 2019. Follow weight trends.    PLAN: Continue oxycodone as needed for pain Start gabapentin 100mg  qhs and titrate for effect Recommend completion of healthcare power of attorney/living will/MOST form RTC in 2-3 weeks  Patient expressed understanding and was in agreement with this plan. She also understands that She can call clinic at any time with any questions, concerns, or complaints.    Time Total: 15 minutes  Visit consisted of counseling and education dealing with the complex and emotionally intense issues of symptom management and palliative care in the setting of serious and potentially life-threatening illness.Greater than 50%  of this time was spent counseling and coordinating care related to the above assessment and plan.  Signed by: Altha Harm, PhD, DNP, NP-C, [facility] [phone] (Work Cell)

## 2018-08-25 NOTE — Progress Notes (Signed)
Patient is here today to follow up on her cancer of upper lobe of right lung. Patient wanted to know what can she do for her tingling on her left foot that started about a month ago. In the past three weeks, it has gotten worse specially when she lays down. Patient denied any SOB, nausea, vomiting, poor appetite, fever, chills, constipation or diarrhea.

## 2018-08-26 LAB — THYROID PANEL WITH TSH
Free Thyroxine Index: 2.5 (ref 1.2–4.9)
T3 Uptake Ratio: 25 % (ref 24–39)
T4, Total: 9.9 ug/dL (ref 4.5–12.0)
TSH: 6.7 u[IU]/mL — ABNORMAL HIGH (ref 0.450–4.500)

## 2018-08-28 IMAGING — CT CT CHEST W/ CM
2 of 3 series · 15 of 36 positions shown, 18 images · IV contrast (iopamidol)
Comparison: PET-CT 08/08/2017 and chest CT 07/17/2017

CLINICAL DATA: Restaging right lung cancer. Initial diagnosis
July 2017. Treated with chemotherapy and radiation therapy.

EXAM:
CT CHEST WITH CONTRAST
TECHNIQUE: Multidetector CT imaging of the chest was performed during
intravenous contrast administration.
CONTRAST:  75mL [id number] IOPAMIDOL ([id number]) INJECTION 61%

[Series 2: axial st · axial · 0.57mm/px · z∈[-523,-295]mm · 12 of 134 slices shown, 15 images]
[im 10/134  mediastinal]
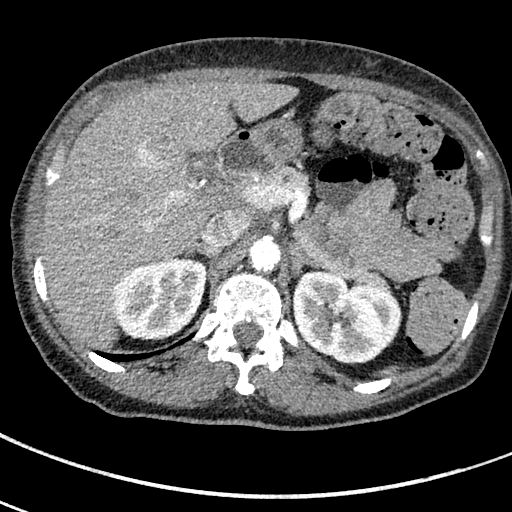
[im 10/134  lung]
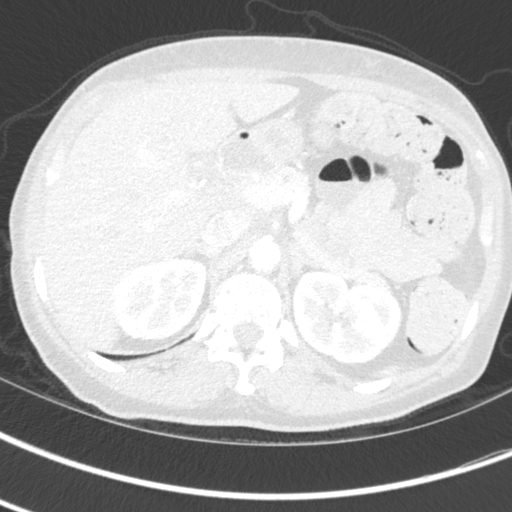
[im 20/134  lung]
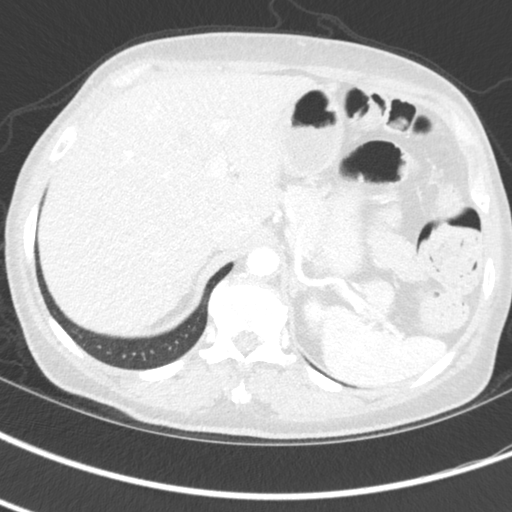
[im 30/134  lung]
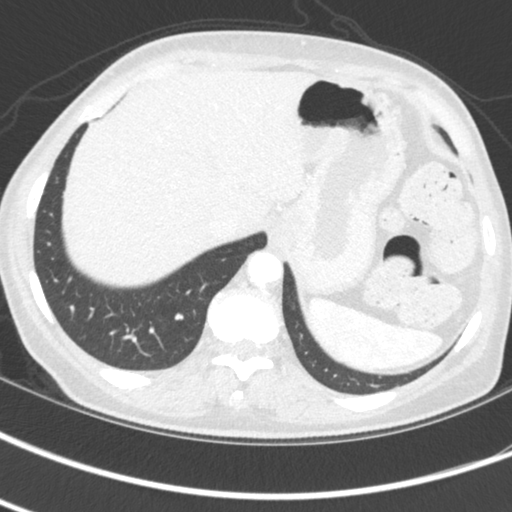
[im 40/134  lung]
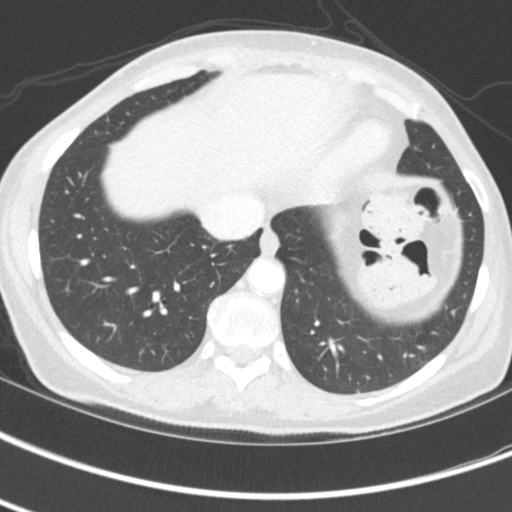
[im 50/134  mediastinal]
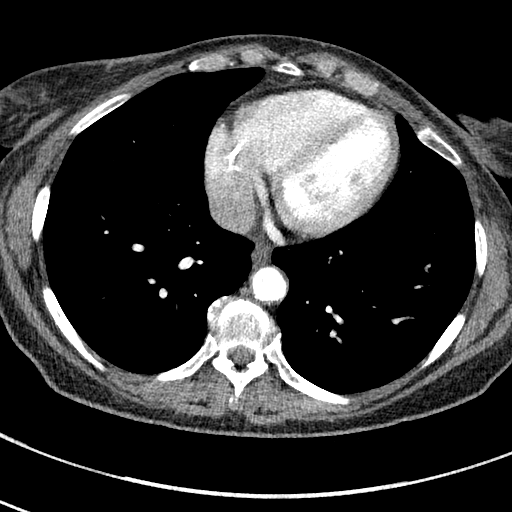
[im 50/134  lung]
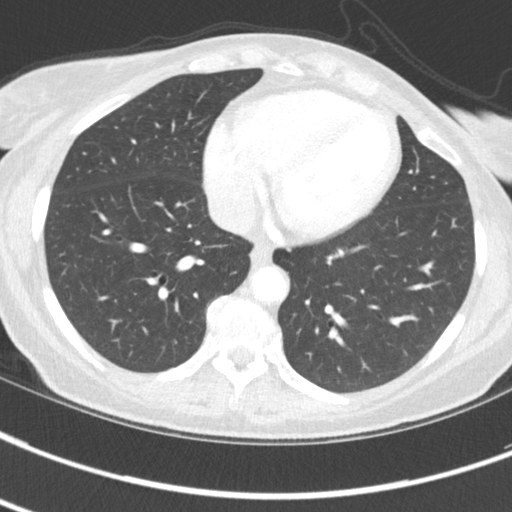
[im 60/134  lung]
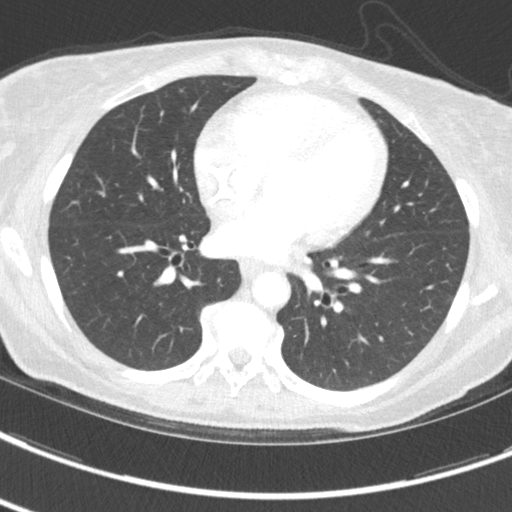
[im 74/134  lung]
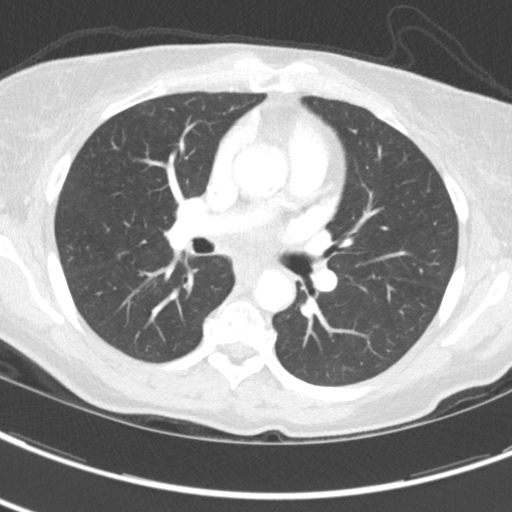
[im 84/134  lung]
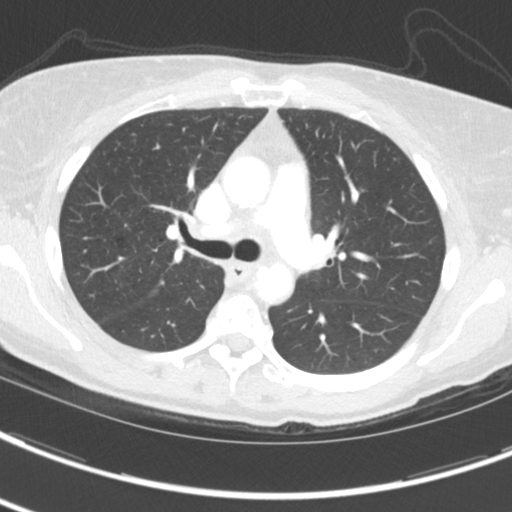
[im 94/134  mediastinal]
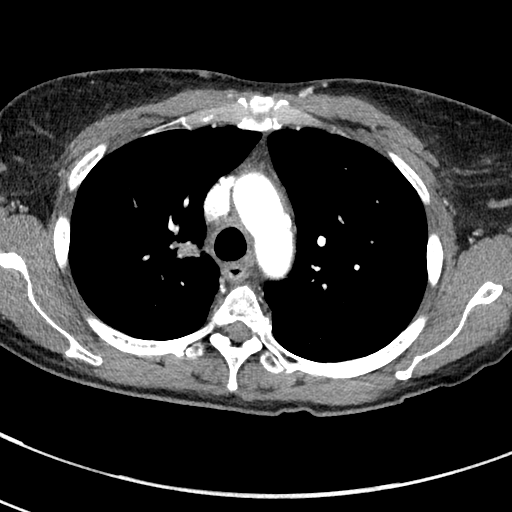
[im 94/134  lung]
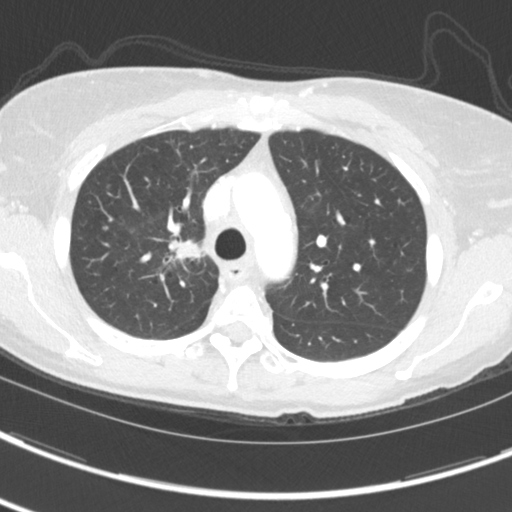
[im 104/134  lung]
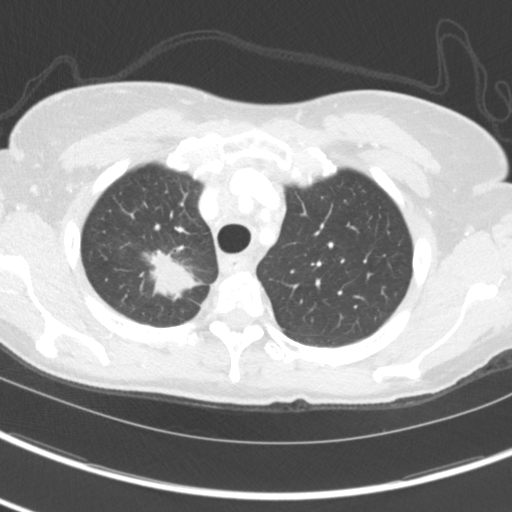
[im 114/134  lung]
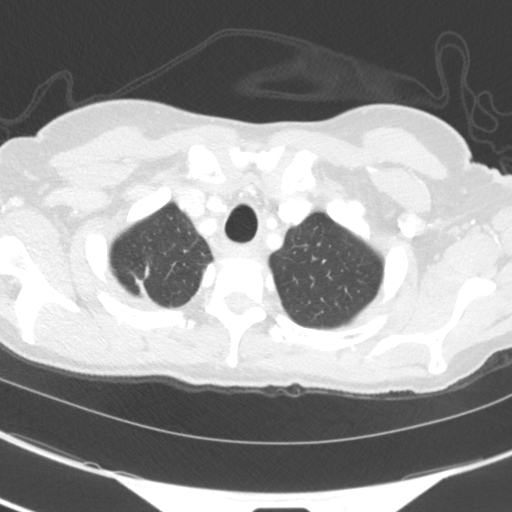
[im 124/134  lung]
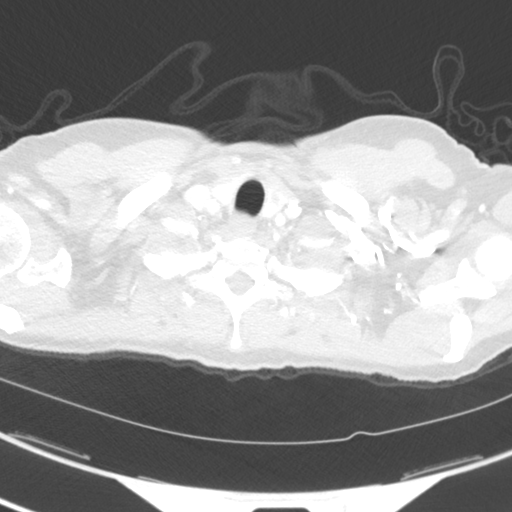

[Series 5: coronal · coronal · 0.55mm/px · 3 of 107 slices shown]
[im 22/107  lung]
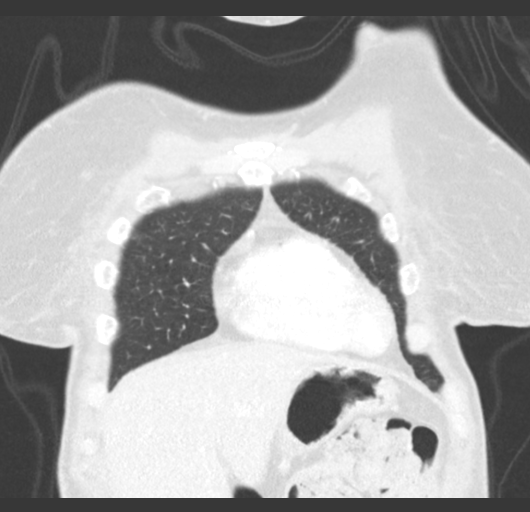
[im 43/107  lung]
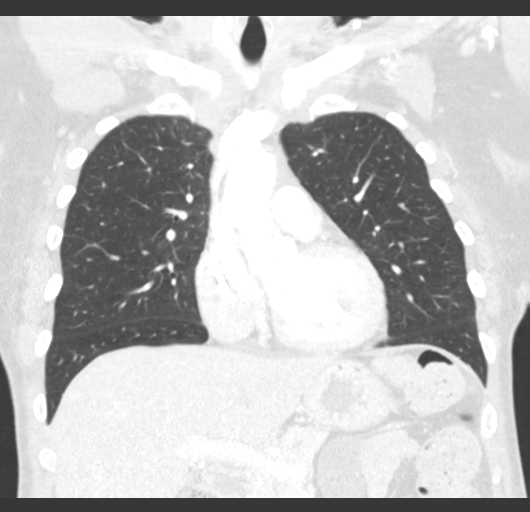
[im 64/107  lung]
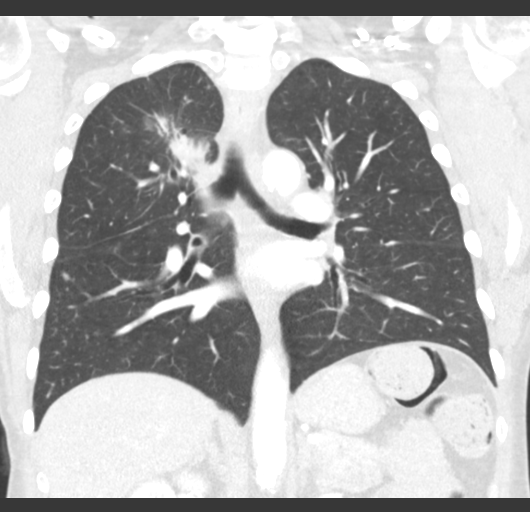

[15 of 36 positions shown; findings below may reference images not displayed]

FINDINGS: Cardiovascular: The heart is normal in size. No pericardial
effusion. Stable tortuosity and atherosclerotic calcifications
involving the aorta. No dissection. Stable branch vessel
calcifications including coronary artery calcifications.

Mediastinum/Nodes: Hazy soft tissue density in the mediastinum
surrounding the trachea could be radiation change. No discrete
measurable lymph nodes in this area. There is a 12 mm right hilar
node on image number 53 which is hard to compare to the prior
noncontrast examinations. The esophagus is grossly normal.

No obvious supraclavicular lymph nodes.  No axillary adenopathy.

The esophagus is grossly normal.

Lungs/Pleura: Stable large right upper lobe lung lesion measuring a
maximum of 2.5 x 2.4 cm. Stable 4 mm nodule in the right lower lobe
just posterior to the major fissure on image number 73.

Stable 5 mm right lower lobe pulmonary nodule on image number 99.

Stable sub 4 mm pulmonary nodules in the left lower lobe.

No new pulmonary nodules to suggest metastatic disease. No acute
overlying pulmonary process. No pleural effusion.

Upper Abdomen: No significant upper abdominal findings. No obvious
adrenal gland or hepatic metastasis.

Musculoskeletal: No worrisome bone lesions.
IMPRESSION: 1. Stable right apical lung mass. No regression or significant
surrounding radiation changes.
2. Improved appearing mediastinal and hilar adenopathy. Ill-defined
soft tissue density likely treated disease without progressive
findings.
3. Stable small bilateral pulmonary nodules. No new new or
progressive findings.
4. No findings for upper abdominal metastatic disease.

Aortic Atherosclerosis (SFT0C-Y5K.K) and Emphysema (SFT0C-U2R.T).

## 2018-09-04 ENCOUNTER — Encounter: Payer: Self-pay | Admitting: Hospice and Palliative Medicine

## 2018-09-04 ENCOUNTER — Inpatient Hospital Stay (HOSPITAL_BASED_OUTPATIENT_CLINIC_OR_DEPARTMENT_OTHER): Payer: BLUE CROSS/BLUE SHIELD | Admitting: Hospice and Palliative Medicine

## 2018-09-04 VITALS — BP 122/74 | HR 87 | Temp 97.1°F | Resp 18 | Ht 60.0 in | Wt 120.8 lb

## 2018-09-04 DIAGNOSIS — I10 Essential (primary) hypertension: Secondary | ICD-10-CM | POA: Diagnosis not present

## 2018-09-04 DIAGNOSIS — M1990 Unspecified osteoarthritis, unspecified site: Secondary | ICD-10-CM

## 2018-09-04 DIAGNOSIS — E785 Hyperlipidemia, unspecified: Secondary | ICD-10-CM

## 2018-09-04 DIAGNOSIS — Z515 Encounter for palliative care: Secondary | ICD-10-CM | POA: Diagnosis not present

## 2018-09-04 DIAGNOSIS — R079 Chest pain, unspecified: Secondary | ICD-10-CM

## 2018-09-04 DIAGNOSIS — R5382 Chronic fatigue, unspecified: Secondary | ICD-10-CM | POA: Diagnosis not present

## 2018-09-04 DIAGNOSIS — R64 Cachexia: Secondary | ICD-10-CM | POA: Diagnosis not present

## 2018-09-04 DIAGNOSIS — C3411 Malignant neoplasm of upper lobe, right bronchus or lung: Secondary | ICD-10-CM | POA: Diagnosis not present

## 2018-09-04 DIAGNOSIS — E876 Hypokalemia: Secondary | ICD-10-CM | POA: Diagnosis not present

## 2018-09-04 DIAGNOSIS — E039 Hypothyroidism, unspecified: Secondary | ICD-10-CM

## 2018-09-04 DIAGNOSIS — Z923 Personal history of irradiation: Secondary | ICD-10-CM | POA: Diagnosis not present

## 2018-09-04 DIAGNOSIS — C787 Secondary malignant neoplasm of liver and intrahepatic bile duct: Secondary | ICD-10-CM | POA: Diagnosis not present

## 2018-09-04 DIAGNOSIS — Z5112 Encounter for antineoplastic immunotherapy: Secondary | ICD-10-CM | POA: Diagnosis not present

## 2018-09-04 DIAGNOSIS — R531 Weakness: Secondary | ICD-10-CM

## 2018-09-04 DIAGNOSIS — R5381 Other malaise: Secondary | ICD-10-CM | POA: Diagnosis not present

## 2018-09-04 DIAGNOSIS — Z5111 Encounter for antineoplastic chemotherapy: Secondary | ICD-10-CM | POA: Diagnosis not present

## 2018-09-04 DIAGNOSIS — Z79899 Other long term (current) drug therapy: Secondary | ICD-10-CM

## 2018-09-04 DIAGNOSIS — G629 Polyneuropathy, unspecified: Secondary | ICD-10-CM

## 2018-09-04 DIAGNOSIS — Z87891 Personal history of nicotine dependence: Secondary | ICD-10-CM

## 2018-09-04 DIAGNOSIS — G893 Neoplasm related pain (acute) (chronic): Secondary | ICD-10-CM

## 2018-09-04 DIAGNOSIS — C7951 Secondary malignant neoplasm of bone: Secondary | ICD-10-CM | POA: Diagnosis not present

## 2018-09-04 MED ORDER — GABAPENTIN 100 MG PO CAPS: 200.0000 mg | ORAL_CAPSULE | Freq: Every day | ORAL | 0 refills | Status: DC

## 2018-09-04 NOTE — Progress Notes (Signed)
No new changes noted today 

## 2018-09-04 NOTE — Progress Notes (Signed)
[place]  [phone] [phone]   Name: Janet [name] Date: 09/04/2018 MRN: [id number]  DOB: October 08, 1955  Patient Care Team: Volney American, PA-C as PCP - General (Family Medicine) [name], RN as Registered Nurse    REASON FOR CONSULTATION: Palliative Care consult requested for this 63 y.o. 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  Palliative care was asked to help support patient through treatment and address goals of care.   SOCIAL HISTORY:    Patient is married but separated.  Patient lives alone.  She has a son and daughter.  Patient used to work on an Designer, television/film set.  ADVANCE DIRECTIVES:  Does not have  CODE STATUS: Full code  PAST MEDICAL HISTORY: Past Medical History:  Diagnosis Date  . Arthritis    right shoulder  . Cancer of right lung ([place]) 08/2017   Chemo + rad tx's.   Marland Kitchen Hyperlipidemia   . Hypertension   . Hypothyroidism   . Menopausal state   . Personal history of chemotherapy 2019   lung ca  . Personal history of radiation therapy 2019   lung ca  . Thyroid disease     PAST SURGICAL HISTORY:  Past Surgical History:  Procedure Laterality Date  . ABDOMINAL HYSTERECTOMY  2005  . BREAST EXCISIONAL BIOPSY Right 1979   benign  . COLONOSCOPY WITH PROPOFOL N/A 05/13/2017   Procedure: COLONOSCOPY WITH PROPOFOL;  Surgeon: [name], MD;  Location: [facility] ENDOSCOPY;  Service: Gastroenterology;  Laterality: N/A;  . CYSTECTOMY Right    breast  . CYSTECTOMY  02/2015   back of neck  . ESOPHAGOGASTRODUODENOSCOPY (EGD) WITH PROPOFOL N/A 05/13/2017   Procedure: ESOPHAGOGASTRODUODENOSCOPY (EGD) WITH PROPOFOL;  Surgeon: [name], MD;  Location[facility] ENDOSCOPY;  Service: Gastroenterology;  Laterality: N/A;  . PORTA CATH  INSERTION N/A 09/01/2017   Procedure: PORTA CATH INSERTION;  Surgeon: Algernon Huxley, MD;  Location: [place] CV LAB;  Service: Cardiovascular;  Laterality: N/A;  . TUBAL LIGATION  1986    HEMATOLOGY/ONCOLOGY HISTORY:    Cancer of upper lobe of right lung ([place])   07/09/2017 Initial Diagnosis    Cancer of upper lobe of right lung ([place])    04/01/2018 -  Chemotherapy    The patient had palonosetron (ALOXI) injection 0.25 mg, 0.25 mg, Intravenous,  Once, 8 of 12 cycles Administration: 0.25 mg (04/01/2018), 0.25 mg (04/22/2018), 0.25 mg (05/13/2018), 0.25 mg (06/03/2018), 0.25 mg (06/23/2018), 0.25 mg (07/14/2018), 0.25 mg (08/04/2018), 0.25 mg (08/25/2018) bevacizumab (AVASTIN) 900 mg in sodium chloride 0.9 % 100 mL chemo infusion, 925 mg, Intravenous,  Once, 8 of 12 cycles Administration: 900 mg (04/01/2018), 900 mg (04/22/2018), 800 mg (06/03/2018), 800 mg (06/23/2018), 800 mg (07/14/2018), 800 mg (08/04/2018), 800 mg (08/25/2018) PEMEtrexed (ALIMTA) 800 mg in sodium chloride 0.9 % 100 mL chemo infusion, 800 mg, Intravenous,  Once, 8 of 12 cycles Administration: 800 mg (04/01/2018), 800 mg (04/22/2018), 800 mg (05/13/2018), 800 mg (06/03/2018), 800 mg (06/23/2018), 800 mg (07/14/2018), 800 mg (08/04/2018), 800 mg (08/25/2018) CARBOplatin (PARAPLATIN) 480 mg in sodium chloride 0.9 % 250 mL chemo infusion, 480 mg (100 % of original dose 480.5 mg), Intravenous,  Once, 8 of 8 cycles Dose modification:   (original dose 480.5 mg, Cycle 1) Administration: 480 mg (04/01/2018), 480 mg (04/22/2018), 480 mg (05/13/2018), 400 mg (06/03/2018),  440 mg (06/23/2018), 440 mg (07/14/2018), 440 mg (08/04/2018), 430 mg (08/25/2018) fosaprepitant (EMEND) 150 mg, dexamethasone (DECADRON) 12 mg in sodium chloride 0.9 % 145 mL IVPB, , Intravenous,  Once, 8 of 12 cycles Administration:  (04/01/2018),  (04/22/2018),  (05/13/2018),  (06/03/2018),  (06/23/2018),  (07/14/2018),  (08/04/2018),  (08/25/2018)  for chemotherapy treatment.       ALLERGIES:  has No Known Allergies.  MEDICATIONS:  Current Outpatient Medications  Medication Sig Dispense Refill  . amLODipine (NORVASC) 5 MG tablet Take 1 tablet (5 mg total) by mouth daily. 90 tablet 1  . atorvastatin (LIPITOR) 10 MG tablet TAKE 1 TABLET DAILY AT 6 P.M. 90 tablet 4  . folic acid (FOLVITE) 1 MG tablet Take 1 tablet (1 mg total) by mouth daily. 90 tablet 2  . gabapentin (NEURONTIN) 100 MG capsule Take 1 capsule (100 mg total) by mouth at bedtime. 30 capsule 0  . levothyroxine (SYNTHROID, LEVOTHROID) 50 MCG tablet TAKE 1 TABLET DAILY 90 tablet 3  . lidocaine-prilocaine (EMLA) cream APP EXT AA 1 TIME  3  . Oxycodone HCl 10 MG TABS Take 1 tablet (10 mg total) by mouth every 6 (six) hours as needed. (Patient not taking: Reported on 09/04/2018) 120 tablet 0   No current facility-administered medications for this visit.     VITAL SIGNS: BP 122/74 (BP Location: Left Arm, Patient Position: Sitting)   Pulse 87   Temp (!) 97.1 F (36.2 C) (Tympanic)   Resp 18   Ht 5' (1.524 m)   Wt 120 lb 12.8 oz (54.8 kg)   LMP 08/08/2003 (Approximate) Comment: Hysterectomy 2004  BMI 23.59 kg/m  Filed Weights   09/04/18 1340  Weight: 120 lb 12.8 oz (54.8 kg)    Estimated body mass index is 23.59 kg/m as calculated from the following:   Height as of this encounter: 5' (1.524 m).   Weight as of this encounter: 120 lb 12.8 oz (54.8 kg).  LABS: CBC:    Component Value Date/Time   WBC 3.1 (L) 08/25/2018 0807   HGB 8.7 (L) 08/25/2018 0807   HGB 10.8 (L) 07/09/2017 1615   HCT 26.2 (L) 08/25/2018 0807   HCT 32.7 (L) 07/09/2017 1615   PLT 150 08/25/2018 0807   PLT 394 (H) 07/09/2017 1615   MCV 105.2 (H) 08/25/2018 0807   MCV 92 07/09/2017 1615   NEUTROABS 2.0 08/25/2018 0807   NEUTROABS 2.5 07/09/2017 1615   LYMPHSABS 0.6 (L) 08/25/2018 0807   LYMPHSABS 3.1 07/09/2017 1615   MONOABS 0.5 08/25/2018 0807   EOSABS 0.0 08/25/2018 0807   EOSABS 0.1 07/09/2017 1615   BASOSABS  0.0 08/25/2018 0807   BASOSABS 0.0 07/09/2017 1615   Comprehensive Metabolic Panel:    Component Value Date/Time   NA 141 08/25/2018 0807   NA 147 (H) 07/09/2017 1615   K 3.4 (L) 08/25/2018 0807   CL 107 08/25/2018 0807   CO2 27 08/25/2018 0807   BUN 9 08/25/2018 0807   BUN 8 07/09/2017 1615   CREATININE 0.91 08/25/2018 0807   GLUCOSE 101 (H) 08/25/2018 0807   CALCIUM 8.6 (L) 08/25/2018 0807   AST 24 08/25/2018 0807   ALT 15 08/25/2018 0807   ALKPHOS 75 08/25/2018 0807   BILITOT 0.2 (L) 08/25/2018 0807   BILITOT <0.2 07/09/2017 1615   PROT 7.3 08/25/2018 0807   PROT 6.6 07/09/2017 1615   ALBUMIN 3.9 08/25/2018 0807   ALBUMIN 4.3 07/09/2017 1615    RADIOGRAPHIC STUDIES: No results found.  PERFORMANCE  STATUS (ECOG) : 1 - Symptomatic but completely ambulatory  Review of Systems As noted above. Otherwise, a complete review of systems is negative.  Physical Exam General: NAD, frail appearing, thin Pulmonary: CTA ant fields Cardiac: RRR ABD: soft, nttp Extremities: no edema, no joint deformities Skin: no rashes Neurological: Weakness but otherwise nonfocal  IMPRESSION: Follow up visit today with patient regarding her pain. She still complains of numbness and tingling most severe when she is trying to sleep. She says that the gabapentin initially seemed to help her sleep but that she no longer feels it is very effective. Will increase dose to 2 tablets (200mg ) at bedtime and further titrate for efficacy.   Patient denies any other acute changes or concerns. She says appetite and energy levels are stable. We discussed her coping with the cancer.    PLAN: Continue oxycodone as needed for pain Increase gabapentin 200mg  qhs and titrate for effect Recommend completion of healthcare power of attorney/living will/MOST form RTC in 3 weeks  Patient expressed understanding and was in agreement with this plan. She also understands that She can call clinic at any time with any  questions, concerns, or complaints.    Time Total: 15 minutes  Visit consisted of counseling and education dealing with the complex and emotionally intense issues of symptom management and palliative care in the setting of serious and potentially life-threatening illness.Greater than 50%  of this time was spent counseling and coordinating care related to the above assessment and plan.  Signed by: Altha Harm, PhD, DNP, NP-C, [facility] [phone] (Work Cell)

## 2018-09-11 NOTE — Progress Notes (Signed)
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    INTERVAL HISTORY: Patient returns to clinic today for further evaluation and consideration of 9 of treatment which is maintenance pemetrexed and Avastin.  She continues to tolerate her treatments well without significant side effects.  She does not complain of pain today.  She continues to have chronic weakness and fatigue.  She has no neurologic complaints.  She denies any recent fevers or illnesses. She denies any chest pain, shortness of breath, hemoptysis, or cough.  She has no nausea, vomiting, constipation, or diarrhea.  She has no melena or hematochezia.  She has no urinary complaints.  Patient offers no specific complaints today.  REVIEW OF SYSTEMS:   Review of Systems  Constitutional: Positive for malaise/fatigue. Negative for fever and weight loss.  Respiratory: Negative.  Negative for cough and shortness of breath.   Cardiovascular: Negative.  Negative for chest pain and leg swelling.  Gastrointestinal: Negative.  Negative for abdominal pain, blood in stool and melena.  Genitourinary: Negative.  Negative for dysuria and flank pain.  Musculoskeletal: Negative for back pain and joint pain.  Skin: Negative.  Negative for rash.  Neurological: Positive for weakness. Negative for sensory change, focal weakness and headaches.  Psychiatric/Behavioral: Negative.  The patient is not nervous/anxious.     As per HPI. Otherwise, a complete review of systems is negative.  PAST MEDICAL HISTORY: Past Medical History:  Diagnosis Date  . Arthritis    right shoulder  . Cancer of right lung ([place]) 08/2017   Chemo +  rad tx's.   Marland Kitchen Hyperlipidemia   . Hypertension   . Hypothyroidism   . Menopausal state   . Personal history of chemotherapy 2019   lung ca  . Personal history of radiation therapy 2019   lung ca  . Thyroid disease     PAST SURGICAL HISTORY: Past Surgical History:  Procedure Laterality Date  . ABDOMINAL HYSTERECTOMY  2005  . BREAST EXCISIONAL BIOPSY Right 1979   benign  . COLONOSCOPY WITH PROPOFOL N/A 05/13/2017   Procedure: COLONOSCOPY WITH PROPOFOL;  Surgeon: [name], MD;  Location: [facility] ENDOSCOPY;  Service: Gastroenterology;  Laterality: N/A;  . CYSTECTOMY Right    breast  . CYSTECTOMY  02/2015   back of neck  . ESOPHAGOGASTRODUODENOSCOPY (EGD) WITH PROPOFOL N/A 05/13/2017   Procedure: ESOPHAGOGASTRODUODENOSCOPY (EGD) WITH PROPOFOL;  Surgeon: [name], MD;  Location: [facility] ENDOSCOPY;  Service: Gastroenterology;  Laterality: N/A;  . PORTA CATH INSERTION N/A 09/01/2017   Procedure: PORTA CATH INSERTION;  Surgeon: Algernon Huxley, MD;  Location: [facility] CV LAB;  Service: Cardiovascular;  Laterality: N/A;  . TUBAL LIGATION  1986    FAMILY HISTORY: Family History  Problem Relation Age of Onset  . Hypertension Mother   . Stroke Mother   . Leukemia Mother   . Stroke Father   . Pneumonia Father   . Diabetes Sister   . Hyperlipidemia Sister   . Breast cancer Maternal Aunt 30    ADVANCED DIRECTIVES (Y/N):  N  HEALTH MAINTENANCE: Social History   Tobacco Use  . Smoking status: Former Smoker    Packs/day: 0.25    Types: Cigarettes    Last attempt to  quit: 02/01/2017    Years since quitting: 1.6  . Smokeless tobacco: Never Used  Substance Use Topics  . Alcohol use: No    Alcohol/week: 0.0 standard drinks  . Drug use: No     Colonoscopy:  PAP:  Bone density:  Lipid panel:  No Known Allergies  Current Outpatient Medications  Medication Sig Dispense Refill  . amLODipine (NORVASC) 5 MG tablet Take 1 tablet (5 mg total) by mouth daily. 90 tablet 1  .  atorvastatin (LIPITOR) 10 MG tablet TAKE 1 TABLET DAILY AT 6 P.M. 90 tablet 4  . folic acid (FOLVITE) 1 MG tablet Take 1 tablet (1 mg total) by mouth daily. 90 tablet 2  . gabapentin (NEURONTIN) 100 MG capsule Take 2 capsules (200 mg total) by mouth at bedtime. 30 capsule 0  . levothyroxine (SYNTHROID, LEVOTHROID) 50 MCG tablet TAKE 1 TABLET DAILY 90 tablet 3  . lidocaine-prilocaine (EMLA) cream APP EXT AA 1 TIME  3  . Oxycodone HCl 10 MG TABS Take 1 tablet (10 mg total) by mouth every 6 (six) hours as needed. 120 tablet 0   No current facility-administered medications for this visit.     OBJECTIVE: Vitals:   09/15/18 0903  BP: 131/73  Pulse: 93  Temp: (!) 97.3 F (36.3 C)     Body mass index is 23.28 kg/m.    ECOG FS:0 - Asymptomatic  General: Well-developed, well-nourished, no acute distress. Eyes: Pink conjunctiva, anicteric sclera. HEENT: Normocephalic, moist mucous membranes. Lungs: Clear to auscultation bilaterally. Heart: Regular rate and rhythm. No rubs, murmurs, or gallops. Abdomen: Soft, nontender, nondistended. No organomegaly noted, normoactive bowel sounds. Musculoskeletal: No edema, cyanosis, or clubbing. Neuro: Alert, answering all questions appropriately. Cranial nerves grossly intact. Skin: No rashes or petechiae noted. Psych: Normal affect.  LAB RESULTS:  Lab Results  Component Value Date   NA 141 09/15/2018   K 3.0 (L) 09/15/2018   CL 108 09/15/2018   CO2 27 09/15/2018   GLUCOSE 109 (H) 09/15/2018   BUN 9 09/15/2018   CREATININE 0.94 09/15/2018   CALCIUM 8.6 (L) 09/15/2018   PROT 7.0 09/15/2018   ALBUMIN 3.7 09/15/2018   AST 24 09/15/2018   ALT 13 09/15/2018   ALKPHOS 68 09/15/2018   BILITOT 0.3 09/15/2018   GFRNONAA >60 09/15/2018   GFRAA >60 09/15/2018    Lab Results  Component Value Date   WBC 2.8 (L) 09/15/2018   NEUTROABS 1.9 09/15/2018   HGB 8.3 (L) 09/15/2018   HCT 25.2 (L) 09/15/2018   MCV 106.8 (H) 09/15/2018   PLT 167 09/15/2018      STUDIES: No results found.  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     PLAN:    1. 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  Nuclear med bone scan on the same day was essentially unchanged.  OmniSeq testing did not reveal any actionable mutations.  Patient wishes to continue with palliative treatment.  Proceed with cycle 9 of treatment which is pemetrexed and Avastin only.  Continue with Zometa on the odd-numbered cycles.  She will require B12 injections and folate supplement along with her pemetrexed.  Return to clinic in 3 weeks for further evaluation and consideration of cycle 10. 2.  Neutropenia: Resolved. 3.  Anemia: Patient's hemoglobin continues to  slowly trend down and is now 8.3. 4.  Pain: Patient does not complain of this today.  Improved with XRT.  Continue current narcotic regimen. 5.  Palliative care: Appreciate input. 6.  Left shoulder mobility: Appreciate rehab input. 7.  Hypokalemia: Slightly worse today.  Consider oral potassium supplementation in the future.  Patient expressed understanding and was in agreement with this plan. She also understands that She can call clinic at any time with any questions, concerns, or complaints.   Cancer Staging Cancer of upper lobe of right lung [facility]) Staging form: Lung, AJCC 8th Edition - Clinical stage from 08/24/2017: Stage IV (cT2a, cN3, cM1c) - Signed by Lloyd Huger, MD on 03/29/2018   Lloyd Huger, MD   09/17/2018 6:20 AM

## 2018-09-15 ENCOUNTER — Inpatient Hospital Stay: Payer: BLUE CROSS/BLUE SHIELD

## 2018-09-15 ENCOUNTER — Other Ambulatory Visit: Payer: Self-pay

## 2018-09-15 ENCOUNTER — Inpatient Hospital Stay (HOSPITAL_BASED_OUTPATIENT_CLINIC_OR_DEPARTMENT_OTHER): Payer: BLUE CROSS/BLUE SHIELD | Admitting: Hospice and Palliative Medicine

## 2018-09-15 ENCOUNTER — Inpatient Hospital Stay (HOSPITAL_BASED_OUTPATIENT_CLINIC_OR_DEPARTMENT_OTHER): Payer: BLUE CROSS/BLUE SHIELD | Admitting: Oncology

## 2018-09-15 ENCOUNTER — Inpatient Hospital Stay: Payer: BLUE CROSS/BLUE SHIELD | Attending: Oncology

## 2018-09-15 VITALS — BP 131/73 | HR 93 | Temp 97.3°F | Ht 60.0 in | Wt 119.2 lb

## 2018-09-15 DIAGNOSIS — E039 Hypothyroidism, unspecified: Secondary | ICD-10-CM

## 2018-09-15 DIAGNOSIS — G629 Polyneuropathy, unspecified: Secondary | ICD-10-CM

## 2018-09-15 DIAGNOSIS — R531 Weakness: Secondary | ICD-10-CM | POA: Diagnosis not present

## 2018-09-15 DIAGNOSIS — E785 Hyperlipidemia, unspecified: Secondary | ICD-10-CM | POA: Insufficient documentation

## 2018-09-15 DIAGNOSIS — Z923 Personal history of irradiation: Secondary | ICD-10-CM | POA: Insufficient documentation

## 2018-09-15 DIAGNOSIS — C786 Secondary malignant neoplasm of retroperitoneum and peritoneum: Secondary | ICD-10-CM | POA: Insufficient documentation

## 2018-09-15 DIAGNOSIS — Z87891 Personal history of nicotine dependence: Secondary | ICD-10-CM | POA: Insufficient documentation

## 2018-09-15 DIAGNOSIS — Z806 Family history of leukemia: Secondary | ICD-10-CM | POA: Insufficient documentation

## 2018-09-15 DIAGNOSIS — C787 Secondary malignant neoplasm of liver and intrahepatic bile duct: Secondary | ICD-10-CM | POA: Insufficient documentation

## 2018-09-15 DIAGNOSIS — I10 Essential (primary) hypertension: Secondary | ICD-10-CM | POA: Insufficient documentation

## 2018-09-15 DIAGNOSIS — E876 Hypokalemia: Secondary | ICD-10-CM | POA: Insufficient documentation

## 2018-09-15 DIAGNOSIS — C3411 Malignant neoplasm of upper lobe, right bronchus or lung: Secondary | ICD-10-CM

## 2018-09-15 DIAGNOSIS — M1990 Unspecified osteoarthritis, unspecified site: Secondary | ICD-10-CM | POA: Insufficient documentation

## 2018-09-15 DIAGNOSIS — Z79899 Other long term (current) drug therapy: Secondary | ICD-10-CM

## 2018-09-15 DIAGNOSIS — Z515 Encounter for palliative care: Secondary | ICD-10-CM | POA: Insufficient documentation

## 2018-09-15 DIAGNOSIS — C7972 Secondary malignant neoplasm of left adrenal gland: Secondary | ICD-10-CM

## 2018-09-15 DIAGNOSIS — Z5111 Encounter for antineoplastic chemotherapy: Secondary | ICD-10-CM | POA: Insufficient documentation

## 2018-09-15 DIAGNOSIS — R5382 Chronic fatigue, unspecified: Secondary | ICD-10-CM

## 2018-09-15 DIAGNOSIS — Z803 Family history of malignant neoplasm of breast: Secondary | ICD-10-CM

## 2018-09-15 DIAGNOSIS — C7951 Secondary malignant neoplasm of bone: Secondary | ICD-10-CM

## 2018-09-15 DIAGNOSIS — C7971 Secondary malignant neoplasm of right adrenal gland: Secondary | ICD-10-CM | POA: Insufficient documentation

## 2018-09-15 DIAGNOSIS — R5381 Other malaise: Secondary | ICD-10-CM

## 2018-09-15 DIAGNOSIS — G893 Neoplasm related pain (acute) (chronic): Secondary | ICD-10-CM

## 2018-09-15 DIAGNOSIS — E531 Pyridoxine deficiency: Secondary | ICD-10-CM

## 2018-09-15 DIAGNOSIS — Z5112 Encounter for antineoplastic immunotherapy: Secondary | ICD-10-CM | POA: Diagnosis not present

## 2018-09-15 DIAGNOSIS — C3490 Malignant neoplasm of unspecified part of unspecified bronchus or lung: Secondary | ICD-10-CM

## 2018-09-15 LAB — CBC WITH DIFFERENTIAL/PLATELET
Abs Immature Granulocytes: 0.02 10*3/uL (ref 0.00–0.07)
Basophils Absolute: 0 10*3/uL (ref 0.0–0.1)
Basophils Relative: 0 %
Eosinophils Absolute: 0 10*3/uL (ref 0.0–0.5)
Eosinophils Relative: 0 %
HCT: 25.2 % — ABNORMAL LOW (ref 36.0–46.0)
Hemoglobin: 8.3 g/dL — ABNORMAL LOW (ref 12.0–15.0)
Immature Granulocytes: 1 %
LYMPHS ABS: 0.5 10*3/uL — AB (ref 0.7–4.0)
Lymphocytes Relative: 17 %
MCH: 35.2 pg — ABNORMAL HIGH (ref 26.0–34.0)
MCHC: 32.9 g/dL (ref 30.0–36.0)
MCV: 106.8 fL — ABNORMAL HIGH (ref 80.0–100.0)
Monocytes Absolute: 0.4 10*3/uL (ref 0.1–1.0)
Monocytes Relative: 15 %
Neutro Abs: 1.9 10*3/uL (ref 1.7–7.7)
Neutrophils Relative %: 67 %
Platelets: 167 10*3/uL (ref 150–400)
RBC: 2.36 MIL/uL — ABNORMAL LOW (ref 3.87–5.11)
RDW: 14.8 % (ref 11.5–15.5)
WBC: 2.8 10*3/uL — ABNORMAL LOW (ref 4.0–10.5)
nRBC: 0 % (ref 0.0–0.2)

## 2018-09-15 LAB — COMPREHENSIVE METABOLIC PANEL
ALBUMIN: 3.7 g/dL (ref 3.5–5.0)
ALT: 13 U/L (ref 0–44)
AST: 24 U/L (ref 15–41)
Alkaline Phosphatase: 68 U/L (ref 38–126)
Anion gap: 6 (ref 5–15)
BUN: 9 mg/dL (ref 8–23)
CO2: 27 mmol/L (ref 22–32)
Calcium: 8.6 mg/dL — ABNORMAL LOW (ref 8.9–10.3)
Chloride: 108 mmol/L (ref 98–111)
Creatinine, Ser: 0.94 mg/dL (ref 0.44–1.00)
GFR calc Af Amer: >60 mL/min (ref >60)
GFR calc non Af Amer: >60 mL/min (ref >60)
Glucose, Bld: 109 mg/dL — ABNORMAL HIGH (ref 70–99)
Potassium: 3 mmol/L — ABNORMAL LOW (ref 3.5–5.1)
SODIUM: 141 mmol/L (ref 135–145)
Total Bilirubin: 0.3 mg/dL (ref 0.3–1.2)
Total Protein: 7 g/dL (ref 6.5–8.1)

## 2018-09-15 LAB — PROTEIN, URINE, RANDOM: Total Protein, Urine: 19 mg/dL

## 2018-09-15 MED ORDER — ZOLEDRONIC ACID 4 MG/100ML IV SOLN
4.0000 mg | Freq: Once | INTRAVENOUS | Status: AC
  Administered 2018-09-15: 4 mg via INTRAVENOUS
  Filled 2018-09-15: qty 100

## 2018-09-15 MED ORDER — SODIUM CHLORIDE 0.9 % IV SOLN
Freq: Once | INTRAVENOUS | Status: AC
  Administered 2018-09-15: 10:00:00 via INTRAVENOUS
  Filled 2018-09-15: qty 250

## 2018-09-15 MED ORDER — SODIUM CHLORIDE 0.9 % IV SOLN
500.0000 mg/m2 | Freq: Once | INTRAVENOUS | Status: AC
  Administered 2018-09-15: 800 mg via INTRAVENOUS
  Filled 2018-09-15: qty 20

## 2018-09-15 MED ORDER — HEPARIN SOD (PORK) LOCK FLUSH 100 UNIT/ML IV SOLN
500.0000 [IU] | Freq: Once | INTRAVENOUS | Status: AC
  Administered 2018-09-15: 500 [IU] via INTRAVENOUS
  Filled 2018-09-15: qty 5

## 2018-09-15 MED ORDER — SODIUM CHLORIDE 0.9 % IV SOLN
Freq: Once | INTRAVENOUS | Status: AC
  Administered 2018-09-15: 10:00:00 via INTRAVENOUS
  Filled 2018-09-15: qty 5

## 2018-09-15 MED ORDER — PALONOSETRON HCL INJECTION 0.25 MG/5ML
0.2500 mg | Freq: Once | INTRAVENOUS | Status: AC
  Administered 2018-09-15: 0.25 mg via INTRAVENOUS
  Filled 2018-09-15: qty 5

## 2018-09-15 MED ORDER — SODIUM CHLORIDE 0.9% FLUSH
10.0000 mL | Freq: Once | INTRAVENOUS | Status: AC
  Administered 2018-09-15: 10 mL via INTRAVENOUS
  Filled 2018-09-15: qty 10

## 2018-09-15 MED ORDER — CYANOCOBALAMIN 1000 MCG/ML IJ SOLN
1000.0000 ug | Freq: Once | INTRAMUSCULAR | Status: AC
  Administered 2018-09-15: 1000 ug via INTRAMUSCULAR
  Filled 2018-09-15: qty 1

## 2018-09-15 MED ORDER — SODIUM CHLORIDE 0.9 % IV SOLN
800.0000 mg | Freq: Once | INTRAVENOUS | Status: AC
  Administered 2018-09-15: 800 mg via INTRAVENOUS
  Filled 2018-09-15: qty 32

## 2018-09-15 NOTE — Progress Notes (Signed)
Patient is here today to follow up on her cancer of upper lobe of right lung. Patient stated that she had been doing well with no complaints. Patient denied fever, chills, nausea, vomiting, SOB, diarrhea and constipation.

## 2018-09-15 NOTE — Progress Notes (Signed)
[place]  [phone] [phone]   Name: Janet [name] Date: 09/15/2018 MRN: [id number]  DOB: Apr 29, 1956  Patient Care Team: Volney American, PA-C as PCP - General (Family Medicine) [name], RN as Registered Nurse    REASON FOR CONSULTATION: Palliative Care consult requested for this 63 y.o. 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  Palliative care was asked to help support patient through treatment and address goals of care.   SOCIAL HISTORY:    Patient is married but separated.  Patient lives alone.  She has a son and daughter.  Patient used to work on an Design[employer], television/film set.  ADVANCE DIRECTIVES:  Does not have  CODE STATUS: Full code  PAST MEDICAL HISTORY: Past Medical History:  Diagnosis Date  . Arthritis    right shoulder  . Cancer of right lung ([place]) 08/2017   Chemo + rad tx's.   Marland Kitchen Hyperlipidemia   . Hypertension   . Hypothyroidism   . Menopausal state   . Personal history of chemotherapy 2019   lung ca  . Personal history of radiation therapy 2019   lung ca  . Thyroid disease     PAST SURGICAL HISTORY:  Past Surgical History:  Procedure Laterality Date  . ABDOMINAL HYSTERECTOMY  2005  . BREAST EXCISIONAL BIOPSY Right 1979   benign  . COLONOSCOPY WITH PROPOFOL N/A 05/13/2017   Procedure: COLONOSCOPY WITH PROPOFOL;  Surgeon: [name], MD;  Location: [facility] ENDOSCOPY;  Service: Gastroenterology;  Laterality: N/A;  . CYSTECTOMY Right    breast  . CYSTECTOMY  02/2015   back of neck  . ESOPHAGOGASTRODUODENOSCOPY (EGD) WITH PROPOFOL N/A 05/13/2017   Procedure: ESOPHAGOGASTRODUODENOSCOPY (EGD) WITH PROPOFOL;  Surgeon: [name], MD;  Location[facility] ENDOSCOPY;  Service: Gastroenterology;  Laterality: N/A;  . PORTA CATH  INSERTION N/A 09/01/2017   Procedure: PORTA CATH INSERTION;  Surgeon: Algernon Huxley, MD;  Location: [place] CV LAB;  Service: Cardiovascular;  Laterality: N/A;  . TUBAL LIGATION  1986    HEMATOLOGY/ONCOLOGY HISTORY:    Cancer of upper lobe of right lung ([place])   07/09/2017 Initial Diagnosis    Cancer of upper lobe of right lung ([place])    04/01/2018 -  Chemotherapy    The patient had palonosetron (ALOXI) injection 0.25 mg, 0.25 mg, Intravenous,  Once, 9 of 12 cycles Administration: 0.25 mg (04/01/2018), 0.25 mg (04/22/2018), 0.25 mg (05/13/2018), 0.25 mg (06/03/2018), 0.25 mg (06/23/2018), 0.25 mg (07/14/2018), 0.25 mg (08/04/2018), 0.25 mg (08/25/2018) bevacizumab (AVASTIN) 900 mg in sodium chloride 0.9 % 100 mL chemo infusion, 925 mg, Intravenous,  Once, 9 of 12 cycles Administration: 900 mg (04/01/2018), 900 mg (04/22/2018), 800 mg (06/03/2018), 800 mg (06/23/2018), 800 mg (07/14/2018), 800 mg (08/04/2018), 800 mg (08/25/2018) PEMEtrexed (ALIMTA) 800 mg in sodium chloride 0.9 % 100 mL chemo infusion, 800 mg, Intravenous,  Once, 9 of 12 cycles Administration: 800 mg (04/01/2018), 800 mg (04/22/2018), 800 mg (05/13/2018), 800 mg (06/03/2018), 800 mg (06/23/2018), 800 mg (07/14/2018), 800 mg (08/04/2018), 800 mg (08/25/2018) CARBOplatin (PARAPLATIN) 480 mg in sodium chloride 0.9 % 250 mL chemo infusion, 480 mg (100 % of original dose 480.5 mg), Intravenous,  Once, 8 of 8 cycles Dose modification:   (original dose 480.5 mg, Cycle 1) Administration: 480 mg (04/01/2018), 480 mg (04/22/2018), 480 mg (05/13/2018), 400 mg (06/03/2018),  440 mg (06/23/2018), 440 mg (07/14/2018), 440 mg (08/04/2018), 430 mg (08/25/2018) fosaprepitant (EMEND) 150 mg, dexamethasone (DECADRON) 12 mg in sodium chloride 0.9 % 145 mL IVPB, , Intravenous,  Once, 9 of 12 cycles Administration:  (04/01/2018),  (04/22/2018),  (05/13/2018),  (06/03/2018),  (06/23/2018),  (07/14/2018),  (08/04/2018),  (08/25/2018)  for chemotherapy treatment.       ALLERGIES:  has No Known Allergies.  MEDICATIONS:  Current Outpatient Medications  Medication Sig Dispense Refill  . amLODipine (NORVASC) 5 MG tablet Take 1 tablet (5 mg total) by mouth daily. 90 tablet 1  . atorvastatin (LIPITOR) 10 MG tablet TAKE 1 TABLET DAILY AT 6 P.M. 90 tablet 4  . folic acid (FOLVITE) 1 MG tablet Take 1 tablet (1 mg total) by mouth daily. 90 tablet 2  . gabapentin (NEURONTIN) 100 MG capsule Take 2 capsules (200 mg total) by mouth at bedtime. 30 capsule 0  . levothyroxine (SYNTHROID, LEVOTHROID) 50 MCG tablet TAKE 1 TABLET DAILY 90 tablet 3  . lidocaine-prilocaine (EMLA) cream APP EXT AA 1 TIME  3  . Oxycodone HCl 10 MG TABS Take 1 tablet (10 mg total) by mouth every 6 (six) hours as needed. 120 tablet 0   No current facility-administered medications for this visit.    Facility-Administered Medications Ordered in Other Visits  Medication Dose Route Frequency Provider Last Rate Last Dose  . bevacizumab (AVASTIN) 800 mg in sodium chloride 0.9 % 100 mL chemo infusion  800 mg Intravenous Once Lloyd Huger, MD      . heparin lock flush 100 unit/mL  500 Units Intravenous Once Lloyd Huger, MD      . PEMEtrexed (ALIMTA) 800 mg in sodium chloride 0.9 % 100 mL chemo infusion  500 mg/m2 (Treatment Plan Recorded) Intravenous Once Lloyd Huger, MD        VITAL SIGNS: LMP 08/08/2003 (Approximate) Comment: Hysterectomy 2004 There were no vitals filed for this visit.  Estimated body mass index is 23.28 kg/m as calculated from the following:   Height as of an earlier encounter on 09/15/18: 5' (1.524 m).   Weight as of an earlier encounter on 09/15/18: 119 lb 3.2 oz (54.1 kg).  LABS: CBC:    Component Value Date/Time   WBC 2.8 (L) 09/15/2018 0842   HGB 8.3 (L) 09/15/2018 0842   HGB 10.8 (L) 07/09/2017 1615   HCT 25.2 (L) 09/15/2018 0842   HCT 32.7 (L) 07/09/2017 1615   PLT 167 09/15/2018 0842   PLT 394 (H) 07/09/2017 1615   MCV 106.8 (H)  09/15/2018 0842   MCV 92 07/09/2017 1615   NEUTROABS 1.9 09/15/2018 0842   NEUTROABS 2.5 07/09/2017 1615   LYMPHSABS 0.5 (L) 09/15/2018 0842   LYMPHSABS 3.1 07/09/2017 1615   MONOABS 0.4 09/15/2018 0842   EOSABS 0.0 09/15/2018 0842   EOSABS 0.1 07/09/2017 1615   BASOSABS 0.0 09/15/2018 0842   BASOSABS 0.0 07/09/2017 1615   Comprehensive Metabolic Panel:    Component Value Date/Time   NA 141 09/15/2018 0842   NA 147 (H) 07/09/2017 1615   K 3.0 (L) 09/15/2018 0842   CL 108 09/15/2018 0842   CO2 27 09/15/2018 0842   BUN 9 09/15/2018 0842   BUN 8 07/09/2017 1615   CREATININE 0.94 09/15/2018 0842   GLUCOSE 109 (H) 09/15/2018 0842   CALCIUM 8.6 (L) 09/15/2018 0842   AST 24 09/15/2018 0842   ALT 13 09/15/2018 0842   ALKPHOS 68 09/15/2018 0842   BILITOT 0.3 09/15/2018 8882  BILITOT <0.2 07/09/2017 1615   PROT 7.0 09/15/2018 0842   PROT 6.6 07/09/2017 1615   ALBUMIN 3.7 09/15/2018 0842   ALBUMIN 4.3 07/09/2017 1615    RADIOGRAPHIC STUDIES: No results found.  PERFORMANCE STATUS (ECOG) : 1 - Symptomatic but completely ambulatory  Review of Systems As noted above. Otherwise, a complete review of systems is negative.  Physical Exam General: NAD, frail appearing, thin Pulmonary: CTA ant fields Cardiac: RRR ABD: soft, nttp Extremities: no edema, no joint deformities Skin: no rashes Neurological: Weakness but otherwise nonfocal  IMPRESSION: Follow up visit today with patient regarding her pain.   Patient says that the increased gabapentin has caused her drowsiness and she feels she is sleeping a lot more. She does not feel that the gabapentin has helped with the neuropathy. Will discontinue. We talked about alternative treatments such as Cymbalta, capsaicin, or acupuncture, but patient wants to hold on further treatment for now.   Patient denies any other acute changes or concerns. She reports eating well.    PLAN: Continue oxycodone as needed for pain Discontinue  gabapentin Will continue to address ACP in future visits RTC in 1 month  Patient expressed understanding and was in agreement with this plan. She also understands that She can call clinic at any time with any questions, concerns, or complaints.    Time Total: 15 minutes  Visit consisted of counseling and education dealing with the complex and emotionally intense issues of symptom management and palliative care in the setting of serious and potentially life-threatening illness.Greater than 50%  of this time was spent counseling and coordinating care related to the above assessment and plan.  Signed by: Altha Harm, PhD, DNP, NP-C, [facility] [phone] (Work Cell)

## 2018-09-17 ENCOUNTER — Encounter: Payer: Self-pay | Admitting: Pharmacy Technician

## 2018-09-17 LAB — THYROID PANEL WITH TSH
Free Thyroxine Index: 3 (ref 1.2–4.9)
T3 Uptake Ratio: 25 % (ref 24–39)
T4, Total: 11.8 ug/dL (ref 4.5–12.0)
TSH: 7.49 u[IU]/mL — ABNORMAL HIGH (ref 0.450–4.500)

## 2018-09-17 NOTE — Progress Notes (Signed)
Patient has been approved for drug assistance by [place] for Avastin. The enrollment period is for one DOS 08/04/18 based on lapse in insurance. First/Last DOS covered is 08/04/18.

## 2018-09-22 ENCOUNTER — Other Ambulatory Visit: Payer: Self-pay

## 2018-09-22 MED ORDER — FOLIC ACID 1 MG PO TABS: 1.0000 mg | ORAL_TABLET | Freq: Every day | ORAL | 4 refills | Status: DC

## 2018-09-23 ENCOUNTER — Other Ambulatory Visit: Payer: Self-pay | Admitting: Family Medicine

## 2018-09-23 MED ORDER — ATORVASTATIN CALCIUM 10 MG PO TABS: ORAL_TABLET | ORAL | 0 refills | Status: DC

## 2018-09-23 NOTE — Telephone Encounter (Signed)
Patient called, left VM to return call to the office. Will need to be advised that Folic Acid was refilled by oncology, she will need to call their office for the refill.

## 2018-09-23 NOTE — Telephone Encounter (Signed)
Copied from [place] [phone]. Topic: Quick Communication - Rx Refill/Question >> Sep 23, 2018 10:33 AM Alanda Slim E wrote: Medication: atorvastatin (LIPITOR) 10 MG tablet  folic acid (FOLVITE) 1 MG tablet   Has the patient contacted their pharmacy? Yes - [facility] mail order pharmacy has mailed it out to Pt but she wont get it for a few days. Pt just wants an emergency supply to last until Mon when script should arrive / please advise    Preferred Pharmacy (with phone number or street name): [facility] [place], [place] - [address] [place] [phone] (Phone) [phone] (Fax)    Agent: Please be advised that RX refills may take up to 3 business days. We ask that you follow-up with your pharmacy.

## 2018-09-24 ENCOUNTER — Other Ambulatory Visit: Payer: Self-pay | Admitting: Physician Assistant

## 2018-10-05 NOTE — Progress Notes (Signed)
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    INTERVAL HISTORY: Patient returns to clinic today for further evaluation and consideration of cycle 10 of treatment which is maintenance pemetrexed and Avastin.  She continues to tolerate her treatments well without significant side effects.  She continues to have right flank pain that is chronic and unchanged, but otherwise feels well.  She continues to have chronic weakness and fatigue.  She has no neurologic complaints.  She denies any recent fevers or illnesses. She denies any chest pain, shortness of breath, hemoptysis, or cough.  She has no nausea, vomiting, constipation, or diarrhea.  She has no melena or hematochezia.  She has no urinary complaints.  Patient offers no further specific complaints today.  REVIEW OF SYSTEMS:   Review of Systems  Constitutional: Positive for malaise/fatigue. Negative for fever and weight loss.  Respiratory: Negative.  Negative for cough and shortness of breath.   Cardiovascular: Negative.  Negative for chest pain and leg swelling.  Gastrointestinal: Negative.  Negative for abdominal pain, blood in stool and melena.  Genitourinary: Positive for flank pain. Negative for dysuria.  Musculoskeletal: Negative for back pain and joint pain.  Skin: Negative.  Negative for rash.  Neurological: Positive for weakness. Negative for sensory change, focal weakness and headaches.  Psychiatric/Behavioral: Negative.  The patient is not nervous/anxious.     As per HPI. Otherwise, a complete review of systems is negative.  PAST MEDICAL HISTORY: Past Medical History:  Diagnosis Date  .  Arthritis    right shoulder  . Cancer of right lung ([place]) 08/2017   Chemo + rad tx's.   Marland Kitchen Hyperlipidemia   . Hypertension   . Hypothyroidism   . Menopausal state   . Personal history of chemotherapy 2019   lung ca  . Personal history of radiation therapy 2019   lung ca  . Thyroid disease     PAST SURGICAL HISTORY: Past Surgical History:  Procedure Laterality Date  . ABDOMINAL HYSTERECTOMY  2005  . BREAST EXCISIONAL BIOPSY Right 1979   benign  . COLONOSCOPY WITH PROPOFOL N/A 05/13/2017   Procedure: COLONOSCOPY WITH PROPOFOL;  Surgeon: [name], MD;  Location: [facility] ENDOSCOPY;  Service: Gastroenterology;  Laterality: N/A;  . CYSTECTOMY Right    breast  . CYSTECTOMY  02/2015   back of neck  . ESOPHAGOGASTRODUODENOSCOPY (EGD) WITH PROPOFOL N/A 05/13/2017   Procedure: ESOPHAGOGASTRODUODENOSCOPY (EGD) WITH PROPOFOL;  Surgeon: [name], MD;  Location: [facility] ENDOSCOPY;  Service: Gastroenterology;  Laterality: N/A;  . PORTA CATH INSERTION N/A 09/01/2017   Procedure: PORTA CATH INSERTION;  Surgeon: Algernon Huxley, MD;  Location: [place] CV LAB;  Service: Cardiovascular;  Laterality: N/A;  . TUBAL LIGATION  1986    FAMILY HISTORY: Family History  Problem Relation Age of Onset  . Hypertension Mother   . Stroke Mother   . Leukemia Mother   . Stroke Father   . Pneumonia Father   . Diabetes Sister   . Hyperlipidemia Sister   . Breast cancer Maternal Aunt 29    ADVANCED DIRECTIVES (Y/N):  N  HEALTH MAINTENANCE: Social History   Tobacco Use  . Smoking status: Former Smoker    Packs/day: 0.25  Types: Cigarettes    Last attempt to quit: 02/01/2017    Years since quitting: 1.6  . Smokeless tobacco: Never Used  Substance Use Topics  . Alcohol use: No    Alcohol/week: 0.0 standard drinks  . Drug use: No     Colonoscopy:  PAP:  Bone density:  Lipid panel:  No Known Allergies  Current Outpatient Medications  Medication Sig Dispense Refill  . amLODipine (NORVASC)  5 MG tablet TAKE 1 TABLET BY MOUTH DAILY 30 tablet 0  . atorvastatin (LIPITOR) 10 MG tablet TAKE 1 TABLET DAILY AT 6 P.M. 10 tablet 0  . folic acid (FOLVITE) 1 MG tablet Take 1 tablet (1 mg total) by mouth daily. 90 tablet 4  . levothyroxine (SYNTHROID, LEVOTHROID) 50 MCG tablet TAKE 1 TABLET DAILY 90 tablet 3  . lidocaine-prilocaine (EMLA) cream APP EXT AA 1 TIME  3  . Oxycodone HCl 10 MG TABS Take 1 tablet (10 mg total) by mouth every 6 (six) hours as needed. 120 tablet 0  . DULoxetine (CYMBALTA) 30 MG capsule Take 1 capsule (30 mg total) by mouth daily. 30 capsule 3   No current facility-administered medications for this visit.     OBJECTIVE: Vitals:   10/06/18 0857  BP: 136/84  Pulse: 86  Resp: 20  Temp: 97.7 F (36.5 C)     Body mass index is 22.83 kg/m.    ECOG FS:0 - Asymptomatic  General: Well-developed, well-nourished, no acute distress. Eyes: Pink conjunctiva, anicteric sclera. HEENT: Normocephalic, moist mucous membranes. Lungs: Clear to auscultation bilaterally. Heart: Regular rate and rhythm. No rubs, murmurs, or gallops. Abdomen: Soft, nontender, nondistended. No organomegaly noted, normoactive bowel sounds. Musculoskeletal: No edema, cyanosis, or clubbing. Neuro: Alert, answering all questions appropriately. Cranial nerves grossly intact. Skin: No rashes or petechiae noted. Psych: Normal affect.  LAB RESULTS:  Lab Results  Component Value Date   NA 140 10/06/2018   K 3.3 (L) 10/06/2018   CL 104 10/06/2018   CO2 26 10/06/2018   GLUCOSE 89 10/06/2018   BUN 9 10/06/2018   CREATININE 1.02 (H) 10/06/2018   CALCIUM 8.6 (L) 10/06/2018   PROT 7.7 10/06/2018   ALBUMIN 3.8 10/06/2018   AST 30 10/06/2018   ALT 19 10/06/2018   ALKPHOS 72 10/06/2018   BILITOT 0.5 10/06/2018   GFRNONAA 58 (L) 10/06/2018   GFRAA >60 10/06/2018    Lab Results  Component Value Date   WBC 4.6 10/06/2018   NEUTROABS 3.2 10/06/2018   HGB 8.9 (L) 10/06/2018   HCT 27.4 (L)  10/06/2018   MCV 107.9 (H) 10/06/2018   PLT 342 10/06/2018     STUDIES: No results found.  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     PLAN:    1. 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  Nuclear med bone scan on the same day was essentially unchanged.  OmniSeq testing did not reveal any actionable mutations.  Patient wishes to continue with palliative treatment.  Proceed with cycle 10 of maintenance pemetrexed and Avastin today. Continue with Zometa on the odd-numbered cycles.  She will require B12 injections and folate supplement along with her pemetrexed.  Return to clinic in 3 weeks for further evaluation and consideration of cycle 11.  Will consider reimaging after cycle 12. 2.  Neutropenia: Resolved. 3.  Anemia: Hemoglobin mildly improved to 8.9.  Monitor. 4.  Pain: Chronic and unchanged.  Patient does not wish to change her narcotic regimen at this time. 5.  Palliative care: Appreciate input. 6.  Left shoulder mobility: Appreciate rehab input. 7.  Hypokalemia: Potassium improved to 3.3 today.  Consider oral potassium supplementation in the future.  Patient expressed understanding and was in agreement with this plan. She also understands that She can call clinic at any time with any questions, concerns, or complaints.   Cancer Staging Cancer of upper lobe of right lung [facility] Staging form: Lung, AJCC 8th Edition - Clinical stage from 08/24/2017: Stage IV (cT2a, cN3, cM1c) - Signed by Lloyd Huger, MD on 03/29/2018   Lloyd Huger, MD   10/07/2018 6:51 AM

## 2018-10-06 ENCOUNTER — Inpatient Hospital Stay: Payer: BLUE CROSS/BLUE SHIELD | Attending: Oncology

## 2018-10-06 ENCOUNTER — Other Ambulatory Visit: Payer: Self-pay

## 2018-10-06 ENCOUNTER — Encounter: Payer: Self-pay | Admitting: Oncology

## 2018-10-06 ENCOUNTER — Inpatient Hospital Stay (HOSPITAL_BASED_OUTPATIENT_CLINIC_OR_DEPARTMENT_OTHER): Payer: BLUE CROSS/BLUE SHIELD | Admitting: Hospice and Palliative Medicine

## 2018-10-06 ENCOUNTER — Inpatient Hospital Stay: Payer: BLUE CROSS/BLUE SHIELD

## 2018-10-06 ENCOUNTER — Inpatient Hospital Stay (HOSPITAL_BASED_OUTPATIENT_CLINIC_OR_DEPARTMENT_OTHER): Payer: BLUE CROSS/BLUE SHIELD | Admitting: Oncology

## 2018-10-06 VITALS — BP 136/84 | HR 86 | Temp 97.7°F | Resp 20 | Wt 116.9 lb

## 2018-10-06 DIAGNOSIS — R109 Unspecified abdominal pain: Secondary | ICD-10-CM

## 2018-10-06 DIAGNOSIS — R5382 Chronic fatigue, unspecified: Secondary | ICD-10-CM

## 2018-10-06 DIAGNOSIS — C7951 Secondary malignant neoplasm of bone: Secondary | ICD-10-CM

## 2018-10-06 DIAGNOSIS — Z87891 Personal history of nicotine dependence: Secondary | ICD-10-CM | POA: Insufficient documentation

## 2018-10-06 DIAGNOSIS — C3411 Malignant neoplasm of upper lobe, right bronchus or lung: Secondary | ICD-10-CM

## 2018-10-06 DIAGNOSIS — G8929 Other chronic pain: Secondary | ICD-10-CM | POA: Diagnosis not present

## 2018-10-06 DIAGNOSIS — E039 Hypothyroidism, unspecified: Secondary | ICD-10-CM

## 2018-10-06 DIAGNOSIS — Z79899 Other long term (current) drug therapy: Secondary | ICD-10-CM

## 2018-10-06 DIAGNOSIS — Z90710 Acquired absence of both cervix and uterus: Secondary | ICD-10-CM | POA: Insufficient documentation

## 2018-10-06 DIAGNOSIS — C7972 Secondary malignant neoplasm of left adrenal gland: Secondary | ICD-10-CM | POA: Diagnosis not present

## 2018-10-06 DIAGNOSIS — E785 Hyperlipidemia, unspecified: Secondary | ICD-10-CM | POA: Insufficient documentation

## 2018-10-06 DIAGNOSIS — Z515 Encounter for palliative care: Secondary | ICD-10-CM | POA: Diagnosis not present

## 2018-10-06 DIAGNOSIS — R52 Pain, unspecified: Secondary | ICD-10-CM

## 2018-10-06 DIAGNOSIS — C786 Secondary malignant neoplasm of retroperitoneum and peritoneum: Secondary | ICD-10-CM | POA: Insufficient documentation

## 2018-10-06 DIAGNOSIS — M1990 Unspecified osteoarthritis, unspecified site: Secondary | ICD-10-CM | POA: Diagnosis not present

## 2018-10-06 DIAGNOSIS — E531 Pyridoxine deficiency: Secondary | ICD-10-CM

## 2018-10-06 DIAGNOSIS — R531 Weakness: Secondary | ICD-10-CM | POA: Diagnosis not present

## 2018-10-06 DIAGNOSIS — C787 Secondary malignant neoplasm of liver and intrahepatic bile duct: Secondary | ICD-10-CM | POA: Insufficient documentation

## 2018-10-06 DIAGNOSIS — G629 Polyneuropathy, unspecified: Secondary | ICD-10-CM

## 2018-10-06 DIAGNOSIS — I10 Essential (primary) hypertension: Secondary | ICD-10-CM

## 2018-10-06 DIAGNOSIS — Z5111 Encounter for antineoplastic chemotherapy: Secondary | ICD-10-CM | POA: Insufficient documentation

## 2018-10-06 DIAGNOSIS — Z5112 Encounter for antineoplastic immunotherapy: Secondary | ICD-10-CM | POA: Diagnosis not present

## 2018-10-06 DIAGNOSIS — E876 Hypokalemia: Secondary | ICD-10-CM | POA: Diagnosis not present

## 2018-10-06 DIAGNOSIS — C3490 Malignant neoplasm of unspecified part of unspecified bronchus or lung: Secondary | ICD-10-CM

## 2018-10-06 DIAGNOSIS — D649 Anemia, unspecified: Secondary | ICD-10-CM

## 2018-10-06 LAB — CBC WITH DIFFERENTIAL/PLATELET
Abs Immature Granulocytes: 0.03 10*3/uL (ref 0.00–0.07)
Basophils Absolute: 0 10*3/uL (ref 0.0–0.1)
Basophils Relative: 0 %
Eosinophils Absolute: 0 10*3/uL (ref 0.0–0.5)
Eosinophils Relative: 0 %
HCT: 27.4 % — ABNORMAL LOW (ref 36.0–46.0)
Hemoglobin: 8.9 g/dL — ABNORMAL LOW (ref 12.0–15.0)
Immature Granulocytes: 1 %
LYMPHS PCT: 17 %
Lymphs Abs: 0.8 10*3/uL (ref 0.7–4.0)
MCH: 35 pg — ABNORMAL HIGH (ref 26.0–34.0)
MCHC: 32.5 g/dL (ref 30.0–36.0)
MCV: 107.9 fL — ABNORMAL HIGH (ref 80.0–100.0)
Monocytes Absolute: 0.5 10*3/uL (ref 0.1–1.0)
Monocytes Relative: 11 %
Neutro Abs: 3.2 10*3/uL (ref 1.7–7.7)
Neutrophils Relative %: 71 %
Platelets: 342 10*3/uL (ref 150–400)
RBC: 2.54 MIL/uL — ABNORMAL LOW (ref 3.87–5.11)
RDW: 14.1 % (ref 11.5–15.5)
WBC: 4.6 10*3/uL (ref 4.0–10.5)
nRBC: 0 % (ref 0.0–0.2)

## 2018-10-06 LAB — COMPREHENSIVE METABOLIC PANEL
ALT: 19 U/L (ref 0–44)
AST: 30 U/L (ref 15–41)
Albumin: 3.8 g/dL (ref 3.5–5.0)
Alkaline Phosphatase: 72 U/L (ref 38–126)
Anion gap: 10 (ref 5–15)
BUN: 9 mg/dL (ref 8–23)
CHLORIDE: 104 mmol/L (ref 98–111)
CO2: 26 mmol/L (ref 22–32)
CREATININE: 1.02 mg/dL — AB (ref 0.44–1.00)
Calcium: 8.6 mg/dL — ABNORMAL LOW (ref 8.9–10.3)
GFR calc Af Amer: >60 mL/min (ref >60)
GFR calc non Af Amer: 58 mL/min — ABNORMAL LOW (ref >60)
Glucose, Bld: 89 mg/dL (ref 70–99)
Potassium: 3.3 mmol/L — ABNORMAL LOW (ref 3.5–5.1)
Sodium: 140 mmol/L (ref 135–145)
Total Bilirubin: 0.5 mg/dL (ref 0.3–1.2)
Total Protein: 7.7 g/dL (ref 6.5–8.1)

## 2018-10-06 LAB — SAMPLE TO BLOOD BANK

## 2018-10-06 LAB — PROTEIN, URINE, RANDOM: Total Protein, Urine: 30 mg/dL

## 2018-10-06 MED ORDER — CYANOCOBALAMIN 1000 MCG/ML IJ SOLN
1000.0000 ug | Freq: Once | INTRAMUSCULAR | Status: AC
  Administered 2018-10-06: 1000 ug via INTRAMUSCULAR
  Filled 2018-10-06: qty 1

## 2018-10-06 MED ORDER — PALONOSETRON HCL INJECTION 0.25 MG/5ML
0.2500 mg | Freq: Once | INTRAVENOUS | Status: AC
  Administered 2018-10-06: 0.25 mg via INTRAVENOUS
  Filled 2018-10-06: qty 5

## 2018-10-06 MED ORDER — HEPARIN SOD (PORK) LOCK FLUSH 100 UNIT/ML IV SOLN
500.0000 [IU] | Freq: Once | INTRAVENOUS | Status: AC | PRN
  Administered 2018-10-06: 500 [IU]
  Filled 2018-10-06: qty 5

## 2018-10-06 MED ORDER — DULOXETINE HCL 30 MG PO CPEP: 30.0000 mg | ORAL_CAPSULE | Freq: Every day | ORAL | 3 refills | Status: AC

## 2018-10-06 MED ORDER — SODIUM CHLORIDE 0.9 % IV SOLN
Freq: Once | INTRAVENOUS | Status: AC
  Administered 2018-10-06: 10:00:00 via INTRAVENOUS
  Filled 2018-10-06: qty 250

## 2018-10-06 MED ORDER — SODIUM CHLORIDE 0.9 % IV SOLN
800.0000 mg | Freq: Once | INTRAVENOUS | Status: AC
  Administered 2018-10-06: 800 mg via INTRAVENOUS
  Filled 2018-10-06: qty 32

## 2018-10-06 MED ORDER — SODIUM CHLORIDE 0.9 % IV SOLN
Freq: Once | INTRAVENOUS | Status: AC
  Administered 2018-10-06: 10:00:00 via INTRAVENOUS
  Filled 2018-10-06: qty 5

## 2018-10-06 MED ORDER — SODIUM CHLORIDE 0.9 % IV SOLN
500.0000 mg/m2 | Freq: Once | INTRAVENOUS | Status: AC
  Administered 2018-10-06: 800 mg via INTRAVENOUS
  Filled 2018-10-06: qty 32

## 2018-10-06 NOTE — Progress Notes (Signed)
Patient here today for follow up and treatment consideration regarding lung cancer. Patient reports pain to right side that is worse when laying down.

## 2018-10-06 NOTE — Progress Notes (Signed)
[place]  [phone] [phone]   Name: Janet [name] Date: 10/06/2018 MRN: [id number]  DOB: May 05, 1956  Patient Care Team: Volney American, PA-C as PCP - General (Family Medicine) [name], RN as Registered Nurse    REASON FOR CONSULTATION: Palliative Care consult requested for this 63 y.o. 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  Palliative care was asked to help support patient through treatment and address goals of care.   SOCIAL HISTORY:    Patient is married but separated.  Patient lives alone.  She has a son and daughter.  Patient used to work on an [employer], television/film set.  ADVANCE DIRECTIVES:  Does not have  CODE STATUS: Full code  PAST MEDICAL HISTORY: Past Medical History:  Diagnosis Date  . Arthritis    right shoulder  . Cancer of right lung ([place]) 08/2017   Chemo + rad tx's.   Marland Kitchen Hyperlipidemia   . Hypertension   . Hypothyroidism   . Menopausal state   . Personal history of chemotherapy 2019   lung ca  . Personal history of radiation therapy 2019   lung ca  . Thyroid disease     PAST SURGICAL HISTORY:  Past Surgical History:  Procedure Laterality Date  . ABDOMINAL HYSTERECTOMY  2005  . BREAST EXCISIONAL BIOPSY Right 1979   benign  . COLONOSCOPY WITH PROPOFOL N/A 05/13/2017   Procedure: COLONOSCOPY WITH PROPOFOL;  Surgeon: [name], MD;  Location: [facility] ENDOSCOPY;  Service: Gastroenterology;  Laterality: N/A;  . CYSTECTOMY Right    breast  . CYSTECTOMY  02/2015   back of neck  . ESOPHAGOGASTRODUODENOSCOPY (EGD) WITH PROPOFOL N/A 05/13/2017   Procedure: ESOPHAGOGASTRODUODENOSCOPY (EGD) WITH PROPOFOL;  Surgeon: [name], MD;  Location: [facility] ENDOSCOPY;  Service: Gastroenterology;  Laterality: N/A;  . PORTA CATH  INSERTION N/A 09/01/2017   Procedure: PORTA CATH INSERTION;  Surgeon: Algernon Huxley, MD;  Location: [place] CV LAB;  Service: Cardiovascular;  Laterality: N/A;  . TUBAL LIGATION  1986    HEMATOLOGY/ONCOLOGY HISTORY:    Cancer of upper lobe of right lung ([place])   07/09/2017 Initial Diagnosis    Cancer of upper lobe of right lung ([place])    04/01/2018 -  Chemotherapy    The patient had palonosetron (ALOXI) injection 0.25 mg, 0.25 mg, Intravenous,  Once, 10 of 12 cycles Administration: 0.25 mg (04/01/2018), 0.25 mg (04/22/2018), 0.25 mg (05/13/2018), 0.25 mg (06/03/2018), 0.25 mg (06/23/2018), 0.25 mg (07/14/2018), 0.25 mg (08/04/2018), 0.25 mg (08/25/2018), 0.25 mg (09/15/2018) bevacizumab (AVASTIN) 900 mg in sodium chloride 0.9 % 100 mL chemo infusion, 925 mg, Intravenous,  Once, 10 of 12 cycles Administration: 900 mg (04/01/2018), 900 mg (04/22/2018), 800 mg (06/03/2018), 800 mg (06/23/2018), 800 mg (07/14/2018), 800 mg (08/04/2018), 800 mg (08/25/2018), 800 mg (09/15/2018) PEMEtrexed (ALIMTA) 800 mg in sodium chloride 0.9 % 100 mL chemo infusion, 800 mg, Intravenous,  Once, 10 of 12 cycles Administration: 800 mg (04/01/2018), 800 mg (04/22/2018), 800 mg (05/13/2018), 800 mg (06/03/2018), 800 mg (06/23/2018), 800 mg (07/14/2018), 800 mg (08/04/2018), 800 mg (08/25/2018), 800 mg (09/15/2018) CARBOplatin (PARAPLATIN) 480 mg in sodium chloride 0.9 % 250 mL chemo infusion, 480 mg (100 % of original dose 480.5 mg), Intravenous,  Once, 8 of 8 cycles Dose modification:   (original dose 480.5 mg, Cycle 1) Administration: 480 mg (04/01/2018),  480 mg (04/22/2018), 480 mg (05/13/2018), 400 mg (06/03/2018), 440 mg (06/23/2018), 440 mg (07/14/2018), 440 mg (08/04/2018), 430 mg (08/25/2018) fosaprepitant (EMEND) 150 mg, dexamethasone (DECADRON) 12 mg in sodium chloride 0.9 % 145 mL IVPB, , Intravenous,  Once, 10 of 12 cycles Administration:  (04/01/2018),  (04/22/2018),  (05/13/2018),  (06/03/2018),  (06/23/2018),  (07/14/2018),   (08/04/2018),  (08/25/2018),  (09/15/2018)  for chemotherapy treatment.      ALLERGIES:  has No Known Allergies.  MEDICATIONS:  Current Outpatient Medications  Medication Sig Dispense Refill  . amLODipine (NORVASC) 5 MG tablet TAKE 1 TABLET BY MOUTH DAILY 30 tablet 0  . atorvastatin (LIPITOR) 10 MG tablet TAKE 1 TABLET DAILY AT 6 P.M. 10 tablet 0  . folic acid (FOLVITE) 1 MG tablet Take 1 tablet (1 mg total) by mouth daily. 90 tablet 4  . gabapentin (NEURONTIN) 100 MG capsule Take 2 capsules (200 mg total) by mouth at bedtime. (Patient not taking: Reported on 10/06/2018) 30 capsule 0  . levothyroxine (SYNTHROID, LEVOTHROID) 50 MCG tablet TAKE 1 TABLET DAILY 90 tablet 3  . lidocaine-prilocaine (EMLA) cream APP EXT AA 1 TIME  3  . Oxycodone HCl 10 MG TABS Take 1 tablet (10 mg total) by mouth every 6 (six) hours as needed. 120 tablet 0   No current facility-administered medications for this visit.    Facility-Administered Medications Ordered in Other Visits  Medication Dose Route Frequency Provider Last Rate Last Dose  . bevacizumab (AVASTIN) 800 mg in sodium chloride 0.9 % 100 mL chemo infusion  800 mg Intravenous Once Lloyd Huger, MD 264 mL/hr at 10/06/18 1043 800 mg at 10/06/18 1043  . heparin lock flush 100 unit/mL  500 Units Intracatheter Once PRN Lloyd Huger, MD      . PEMEtrexed (ALIMTA) 800 mg in sodium chloride 0.9 % 100 mL chemo infusion  500 mg/m2 (Treatment Plan Recorded) Intravenous Once Lloyd Huger, MD        VITAL SIGNS: LMP 08/08/2003 (Approximate) Comment: Hysterectomy 2004 There were no vitals filed for this visit.  Estimated body mass index is 22.83 kg/m as calculated from the following:   Height as of 09/15/18: 5' (1.524 m).   Weight as of an earlier encounter on 10/06/18: 116 lb 14.4 oz (53 kg).  LABS: CBC:    Component Value Date/Time   WBC 4.6 10/06/2018 0832   HGB 8.9 (L) 10/06/2018 0832   HGB 10.8 (L) 07/09/2017 1615   HCT 27.4 (L)  10/06/2018 0832   HCT 32.7 (L) 07/09/2017 1615   PLT 342 10/06/2018 0832   PLT 394 (H) 07/09/2017 1615   MCV 107.9 (H) 10/06/2018 0832   MCV 92 07/09/2017 1615   NEUTROABS 3.2 10/06/2018 0832   NEUTROABS 2.5 07/09/2017 1615   LYMPHSABS 0.8 10/06/2018 0832   LYMPHSABS 3.1 07/09/2017 1615   MONOABS 0.5 10/06/2018 0832   EOSABS 0.0 10/06/2018 0832   EOSABS 0.1 07/09/2017 1615   BASOSABS 0.0 10/06/2018 0832   BASOSABS 0.0 07/09/2017 1615   Comprehensive Metabolic Panel:    Component Value Date/Time   NA 140 10/06/2018 0832   NA 147 (H) 07/09/2017 1615   K 3.3 (L) 10/06/2018 0832   CL 104 10/06/2018 0832   CO2 26 10/06/2018 0832   BUN 9 10/06/2018 0832   BUN 8 07/09/2017 1615   CREATININE 1.02 (H) 10/06/2018 0832   GLUCOSE 89 10/06/2018 0832   CALCIUM 8.6 (L) 10/06/2018 0832   AST 30 10/06/2018 0832   ALT  19 10/06/2018 0832   ALKPHOS 72 10/06/2018 0832   BILITOT 0.5 10/06/2018 0832   BILITOT <0.2 07/09/2017 1615   PROT 7.7 10/06/2018 0832   PROT 6.6 07/09/2017 1615   ALBUMIN 3.8 10/06/2018 0832   ALBUMIN 4.3 07/09/2017 1615    RADIOGRAPHIC STUDIES: No results found.  PERFORMANCE STATUS (ECOG) : 1 - Symptomatic but completely ambulatory  Review of Systems As noted above. Otherwise, a complete review of systems is negative.  Physical Exam General: NAD, frail appearing, thin Pulmonary: CTA ant fields Cardiac: RRR ABD: soft, nttp Extremities: no edema, no joint deformities Skin: no rashes Neurological: Weakness but otherwise nonfocal  IMPRESSION: Follow up visit today with patient regarding her pain.   Patient was tried on gabapentin but did not tolerate.  She did not like the sedating effects of the medication.  We have talked about nonpharmacological strategies including acupuncture but patient is still deliberating.  Patient says pain is worse at night.  She is in agreement with trial of duloxetine.  Will start duloxetine 30 mg daily and may increase to 60 mg if  needed.  Patient denies any other acute changes or concerns. She reports eating well.   Reviewed with patient a MOST form. She wants to take this home to review in more detail prior to completing it later.    PLAN: -Continue oxycodone as needed for pain -Trial of duloxetine 30mg  daily. Consider increasing to 60mg  if needed after 1 week -MOST form reviewed -RTC in 1 month  Patient expressed understanding and was in agreement with this plan. She also understands that She can call clinic at any time with any questions, concerns, or complaints.    Time Total: 15 minutes  Visit consisted of counseling and education dealing with the complex and emotionally intense issues of symptom management and palliative care in the setting of serious and potentially life-threatening illness.Greater than 50%  of this time was spent counseling and coordinating care related to the above assessment and plan.  Signed by: Altha Harm, PhD, DNP, NP-C, [facility] [phone] (Work Cell)

## 2018-10-07 ENCOUNTER — Encounter: Payer: Self-pay | Admitting: Pharmacy Technician

## 2018-10-07 NOTE — Progress Notes (Signed)
Patient has been approved for drug assistance by [employer] for Alimta. The enrollment period is for one DOS 08/04/18 based on lapse in insurance. First/Last DOS covered is 08/04/18.

## 2018-10-08 ENCOUNTER — Ambulatory Visit (INDEPENDENT_AMBULATORY_CARE_PROVIDER_SITE_OTHER): Payer: BLUE CROSS/BLUE SHIELD | Admitting: Nurse Practitioner

## 2018-10-08 ENCOUNTER — Encounter: Payer: Self-pay | Admitting: Nurse Practitioner

## 2018-10-08 VITALS — BP 124/77 | HR 63 | Temp 98.1°F | Ht 60.1 in | Wt 118.2 lb

## 2018-10-08 DIAGNOSIS — C3411 Malignant neoplasm of upper lobe, right bronchus or lung: Secondary | ICD-10-CM

## 2018-10-08 DIAGNOSIS — E785 Hyperlipidemia, unspecified: Secondary | ICD-10-CM

## 2018-10-08 DIAGNOSIS — I10 Essential (primary) hypertension: Secondary | ICD-10-CM | POA: Diagnosis not present

## 2018-10-08 DIAGNOSIS — E039 Hypothyroidism, unspecified: Secondary | ICD-10-CM

## 2018-10-08 DIAGNOSIS — Z0000 Encounter for general adult medical examination without abnormal findings: Secondary | ICD-10-CM | POA: Diagnosis not present

## 2018-10-08 NOTE — Assessment & Plan Note (Signed)
Chronic, ongoing.  Continue current medication regimen.  Lipid panel today.

## 2018-10-08 NOTE — Addendum Note (Signed)
Addended by: Crissie Figures R on: 10/08/2018 09:03 AM   Modules accepted: Orders

## 2018-10-08 NOTE — Assessment & Plan Note (Signed)
Recent TSH elevate, have recommended taking medication 30 minutes before meal and other medications.  Recheck level in 6 weeks and adjust dose as needed.  Currently maintain Levothyroxine dose.

## 2018-10-08 NOTE — Assessment & Plan Note (Signed)
Continue to collaborate with oncology team.

## 2018-10-08 NOTE — Assessment & Plan Note (Signed)
Chronic, ongoing.  Continue current medication regimen.  BP below goal in office and home.

## 2018-10-08 NOTE — Progress Notes (Signed)
BP 124/77   Pulse 63   Temp 98.1 F (36.7 C) (Oral)   Ht 5' 0.1" (1.527 m)   Wt 118 lb 3.2 oz (53.6 kg)   LMP 08/08/2003 (Approximate) Comment: Hysterectomy 2004  SpO2 96%   BMI 23.01 kg/m    Subjective:    Patient ID: Janet [name], female    DOB: 03/25/1956, 63 y.o.   MRN: [id number]  HPI: Janet [name] is a 63 y.o. female presenting on 10/08/2018 for comprehensive medical examination. Current medical complaints include:none  She currently lives with: sister Menopausal Symptoms: no   HYPERTENSION / HYPERLIPIDEMIA Satisfied with current treatment? yes Duration of hypertension: chronic BP monitoring frequency: rarely BP range: 120/70-80's at home BP medication side effects: no Duration of hyperlipidemia: chronic Cholesterol medication side effects: no Cholesterol supplements: none Medication compliance: good compliance Aspirin: no Recent stressors: no Recurrent headaches: no Visual changes: no Palpitations: no Dyspnea: no Chest pain: no Lower extremity edema: no Dizzy/lightheaded: no   HYPOTHYROIDISM Current Levothyroxine dose 50 MCG, last TSH 09/15/18 7.490: she reports no dose changes.  Endorses she has been taking her medication with food and her other medications in the morning.  Educated her on how to take Levothyroxine for best benefit. Thyroid control status:stable Satisfied with current treatment? yes Medication side effects: no Medication compliance: good compliance Etiology of hypothyroidism:  Recent dose adjustment:no Fatigue: yes with treatments Cold intolerance: yes (reports she has "always been cold natured") Heat intolerance: no Weight gain: no Weight loss: no Constipation: no Diarrhea/loose stools: no Palpitations: no Lower extremity edema: no Anxiety/depressed mood: no   LUNG CANCER: She is followed by Dr. 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  Last visit was 10/06/2018.  She is also  followed by Billey Chang the palliative NP.  Currently undergoing infusions.  Has been treated for some anemia and low K+ with treatments.  She had recent labs at 10/06/18 -- CBC, CMP.  Also had TSH 09/15/18.  Depression Screen done today and results listed below:  Depression screen [facility] 2/9 10/08/2018 12/30/2017 10/20/2017 07/09/2017 04/04/2017  Decreased Interest 0 0 0 0 3  Down, Depressed, Hopeless 0 0 0 0 1  PHQ - 2 Score 0 0 0 0 4  Altered sleeping 1 - - 1 1  Tired, decreased energy 1 - - 1 2  Change in appetite 0 - - 2 2  Feeling bad or failure about yourself  0 - - 1 2  Trouble concentrating 0 - - 0 0  Moving slowly or fidgety/restless 0 - - 0 0  Suicidal thoughts 0 - - 0 0  PHQ-9 Score 2 - - 5 11  Difficult doing work/chores Not difficult at all - - - -    The patient does not have a history of falls. I did not complete a risk assessment for falls. A plan of care for falls was not documented.   Past Medical History:  Past Medical History:  Diagnosis Date  . Arthritis    right shoulder  . Cancer of right lung ([place]) 08/2017   Chemo + rad tx's.   Marland Kitchen Hyperlipidemia   . Hypertension   . Hypothyroidism   . Menopausal state   . Personal history of chemotherapy 2019   lung ca  . Personal history of radiation therapy 2019   lung ca  . Thyroid disease     Surgical History:  Past Surgical History:  Procedure Laterality Date  .  ABDOMINAL HYSTERECTOMY  2005  . BREAST EXCISIONAL BIOPSY Right 1979   benign  . COLONOSCOPY WITH PROPOFOL N/A 05/13/2017   Procedure: COLONOSCOPY WITH PROPOFOL;  Surgeon: [name], MD;  Location: [facility] ENDOSCOPY;  Service: Gastroenterology;  Laterality: N/A;  . CYSTECTOMY Right    breast  . CYSTECTOMY  02/2015   back of neck  . ESOPHAGOGASTRODUODENOSCOPY (EGD) WITH PROPOFOL N/A 05/13/2017   Procedure: ESOPHAGOGASTRODUODENOSCOPY (EGD) WITH PROPOFOL;  Surgeon: [name], MD;  Location: [facility] ENDOSCOPY;  Service: Gastroenterology;  Laterality: N/A;  . PORTA  CATH INSERTION N/A 09/01/2017   Procedure: PORTA CATH INSERTION;  Surgeon: Algernon Huxley, MD;  Location: [facility] LAB;  Service: Cardiovascular;  Laterality: N/A;  . TUBAL LIGATION  1986    Medications:  Current Outpatient Medications on File Prior to Visit  Medication Sig  . amLODipine (NORVASC) 5 MG tablet TAKE 1 TABLET BY MOUTH DAILY  . atorvastatin (LIPITOR) 10 MG tablet TAKE 1 TABLET DAILY AT 6 P.M.  . folic acid (FOLVITE) 1 MG tablet Take 1 tablet (1 mg total) by mouth daily.  Marland Kitchen levothyroxine (SYNTHROID, LEVOTHROID) 50 MCG tablet TAKE 1 TABLET DAILY  . lidocaine-prilocaine (EMLA) cream APP EXT AA 1 TIME  . Oxycodone HCl 10 MG TABS Take 1 tablet (10 mg total) by mouth every 6 (six) hours as needed.  . DULoxetine (CYMBALTA) 30 MG capsule Take 1 capsule (30 mg total) by mouth daily. (Patient not taking: Reported on 10/08/2018)  . [DISCONTINUED] prochlorperazine (COMPAZINE) 10 MG tablet Take 1 tablet (10 mg total) by mouth every 6 (six) hours as needed (Nausea or vomiting).   No current facility-administered medications on file prior to visit.     Allergies:  No Known Allergies  Social History:  Social History   Socioeconomic History  . Marital status: Married    Spouse name: Not on file  . Number of children: Not on file  . Years of education: Not on file  . Highest education level: Not on file  Occupational History  . Not on file  Social Needs  . Financial resource strain: Not on file  . Food insecurity:    Worry: Not on file    Inability: Not on file  . Transportation needs:    Medical: Not on file    Non-medical: Not on file  Tobacco Use  . Smoking status: Former Smoker    Packs/day: 0.25    Types: Cigarettes    Last attempt to quit: 02/01/2017    Years since quitting: 1.6  . Smokeless tobacco: Never Used  Substance and Sexual Activity  . Alcohol use: No    Alcohol/week: 0.0 standard drinks  . Drug use: No  . Sexual activity: Not Currently  Lifestyle  .  Physical activity:    Days per week: Not on file    Minutes per session: Not on file  . Stress: Not on file  Relationships  . Social connections:    Talks on phone: Not on file    Gets together: Not on file    Attends religious service: Not on file    Active member of club or organization: Not on file    Attends meetings of clubs or organizations: Not on file    Relationship status: Not on file  . Intimate partner violence:    Fear of current or ex partner: Not on file    Emotionally abused: Not on file    Physically abused: Not on file    Forced  sexual activity: Not on file  Other Topics Concern  . Not on file  Social History Narrative  . Not on file   Social History   Tobacco Use  Smoking Status Former Smoker  . Packs/day: 0.25  . Types: Cigarettes  . Last attempt to quit: 02/01/2017  . Years since quitting: 1.6  Smokeless Tobacco Never Used   Social History   Substance and Sexual Activity  Alcohol Use No  . Alcohol/week: 0.0 standard drinks    Family History:  Family History  Problem Relation Age of Onset  . Hypertension Mother   . Stroke Mother   . Leukemia Mother   . Stroke Father   . Pneumonia Father   . Diabetes Sister   . Hyperlipidemia Sister   . Breast cancer Maternal Aunt 70    Past medical history, surgical history, medications, allergies, family history and social history reviewed with patient today and changes made to appropriate areas of the chart.   Review of Systems - Negative All other ROS negative except what is listed above and in the HPI.      Objective:    BP 124/77   Pulse 63   Temp 98.1 F (36.7 C) (Oral)   Ht 5' 0.1" (1.527 m)   Wt 118 lb 3.2 oz (53.6 kg)   LMP 08/08/2003 (Approximate) Comment: Hysterectomy 2004  SpO2 96%   BMI 23.01 kg/m   Wt Readings from Last 3 Encounters:  10/08/18 118 lb 3.2 oz (53.6 kg)  10/06/18 116 lb 14.4 oz (53 kg)  09/15/18 119 lb 3.2 oz (54.1 kg)    Physical Exam Vitals signs and nursing  note reviewed.  Constitutional:      General: She is awake.     Appearance: She is well-developed, well-groomed and underweight.  HENT:     Head: Normocephalic and atraumatic.     Right Ear: Hearing, tympanic membrane, ear canal and external ear normal.     Left Ear: Hearing, tympanic membrane, ear canal and external ear normal.     Nose: Nose normal.     Right Sinus: No maxillary sinus tenderness or frontal sinus tenderness.     Left Sinus: No maxillary sinus tenderness or frontal sinus tenderness.     Mouth/Throat:     Mouth: Mucous membranes are moist.     Pharynx: Oropharynx is clear.  Eyes:     General: Lids are normal.        Right eye: No discharge.        Left eye: No discharge.     Extraocular Movements: Extraocular movements intact.     Conjunctiva/sclera: Conjunctivae normal.     Pupils: Pupils are equal, round, and reactive to light.     Visual Fields: Right eye visual fields normal and left eye visual fields normal.  Neck:     Musculoskeletal: Normal range of motion and neck supple.     Thyroid: No thyromegaly.     Vascular: No carotid bruit or JVD.  Cardiovascular:     Rate and Rhythm: Normal rate and regular rhythm.     Heart sounds: Normal heart sounds. No murmur. No gallop.   Pulmonary:     Effort: Pulmonary effort is normal.     Breath sounds: Normal breath sounds.  Abdominal:     General: Bowel sounds are normal.     Palpations: Abdomen is soft. There is no hepatomegaly or splenomegaly.  Musculoskeletal: Normal range of motion.     Right lower leg: No  edema.     Left lower leg: No edema.  Lymphadenopathy:     Cervical: No cervical adenopathy.  Skin:    General: Skin is warm and dry.  Neurological:     Mental Status: She is alert and oriented to person, place, and time.     Deep Tendon Reflexes: Reflexes are normal and symmetric.     Reflex Scores:      Brachioradialis reflexes are 2+ on the right side and 2+ on the left side.      Patellar reflexes  are 2+ on the right side and 2+ on the left side. Psychiatric:        Attention and Perception: Attention normal.        Mood and Affect: Mood normal.        Speech: Speech normal.        Behavior: Behavior normal. Behavior is cooperative.        Thought Content: Thought content normal.        Cognition and Memory: Cognition normal.        Judgment: Judgment normal.     Results for orders placed or performed in visit on 10/06/18  Comprehensive metabolic panel  Result Value Ref Range   Sodium 140 135 - 145 mmol/L   Potassium 3.3 (L) 3.5 - 5.1 mmol/L   Chloride 104 98 - 111 mmol/L   CO2 26 22 - 32 mmol/L   Glucose, Bld 89 70 - 99 mg/dL   BUN 9 8 - 23 mg/dL   Creatinine, Ser 1.02 (H) 0.44 - 1.00 mg/dL   Calcium 8.6 (L) 8.9 - 10.3 mg/dL   Total Protein 7.7 6.5 - 8.1 g/dL   Albumin 3.8 3.5 - 5.0 g/dL   AST 30 15 - 41 U/L   ALT 19 0 - 44 U/L   Alkaline Phosphatase 72 38 - 126 U/L   Total Bilirubin 0.5 0.3 - 1.2 mg/dL   GFR calc non Af Amer 58 (L) >60 mL/min   GFR calc Af Amer >60 >60 mL/min   Anion gap 10 5 - 15  CBC with Differential  Result Value Ref Range   WBC 4.6 4.0 - 10.5 K/uL   RBC 2.54 (L) 3.87 - 5.11 MIL/uL   Hemoglobin 8.9 (L) 12.0 - 15.0 g/dL   HCT 27.4 (L) 36.0 - 46.0 %   MCV 107.9 (H) 80.0 - 100.0 fL   MCH 35.0 (H) 26.0 - 34.0 pg   MCHC 32.5 30.0 - 36.0 g/dL   RDW 14.1 11.5 - 15.5 %   Platelets 342 150 - 400 K/uL   nRBC 0.0 0.0 - 0.2 %   Neutrophils Relative % 71 %   Neutro Abs 3.2 1.7 - 7.7 K/uL   Lymphocytes Relative 17 %   Lymphs Abs 0.8 0.7 - 4.0 K/uL   Monocytes Relative 11 %   Monocytes Absolute 0.5 0.1 - 1.0 K/uL   Eosinophils Relative 0 %   Eosinophils Absolute 0.0 0.0 - 0.5 K/uL   Basophils Relative 0 %   Basophils Absolute 0.0 0.0 - 0.1 K/uL   Immature Granulocytes 1 %   Abs Immature Granulocytes 0.03 0.00 - 0.07 K/uL  Protein, urine, random  Result Value Ref Range   Total Protein, Urine 30 mg/dL  Hold Tube- Blood Bank  Result Value Ref  Range   Blood Bank Specimen SAMPLE AVAILABLE FOR TESTING    Sample Expiration      10/09/2018 Performed at [facility], [address] [place], [place] [address]       Assessment & Plan:   Problem List Items Addressed This Visit      Cardiovascular and Mediastinum   Hypertension    Chronic, ongoing.  Continue current medication regimen.  BP below goal in office and home.        Respiratory   Cancer of upper lobe of right lung ([place])    Continue to collaborate with oncology team.        Endocrine   Hypothyroidism    Recent TSH elevate, have recommended taking medication 30 minutes before meal and other medications.  Recheck level in 6 weeks and adjust dose as needed.  Currently maintain Levothyroxine dose.      Relevant Orders   Thyroid Panel With TSH     Other   Hyperlipidemia    Chronic, ongoing.  Continue current medication regimen.  Lipid panel today.      Relevant Orders   Lipid Panel w/o Chol/HDL Ratio    Other Visit Diagnoses    Encounter for annual physical exam    -  Primary       Follow up plan: Return in about 6 months (around 04/10/2019) for HTN/HLD -- 6 weeks come in for lab only.   LABORATORY TESTING:  - Pap smear: not applicable  IMMUNIZATIONS:   - Tdap: Tetanus vaccination status reviewed: up to date - Influenza: Refused - Pneumovax: Up to date - Prevnar: Not applicable - HPV: Not applicable - Zostavax vaccine: Refused  SCREENING: -Mammogram: Up to date  - Colonoscopy: Up to date  - Bone Density: Refused  -Hearing Test: Not applicable  -Spirometry: Not applicable   PATIENT COUNSELING:   Advised to take 1 mg of folate supplement per day if capable of pregnancy.   Sexuality: Discussed sexually transmitted diseases, partner selection, use of condoms, avoidance of unintended pregnancy  and contraceptive alternatives.   Advised to avoid cigarette smoking.  I discussed with the patient that most people either abstain from  alcohol or drink within safe limits (<=14/week and <=4 drinks/occasion for males, <=7/weeks and <= 3 drinks/occasion for females) and that the risk for alcohol disorders and other health effects rises proportionally with the number of drinks per week and how often a drinker exceeds daily limits.  Discussed cessation/primary prevention of drug use and availability of treatment for abuse.   Diet: Encouraged to adjust caloric intake to maintain  or achieve ideal body weight, to reduce intake of dietary saturated fat and total fat, to limit sodium intake by avoiding high sodium foods and not adding table salt, and to maintain adequate dietary potassium and calcium preferably from fresh fruits, vegetables, and low-fat dairy products.    stressed the importance of regular exercise  Injury prevention: Discussed safety belts, safety helmets, smoke detector, smoking near bedding or upholstery.   Dental health: Discussed importance of regular tooth brushing, flossing, and dental visits.    NEXT PREVENTATIVE PHYSICAL DUE IN 1 YEAR. Return in about 6 months (around 04/10/2019) for HTN/HLD -- 6 weeks come in for lab only.

## 2018-10-09 LAB — THYROID PANEL WITH TSH
Free Thyroxine Index: 3.1 (ref 1.2–4.9)
T3 Uptake Ratio: 26 % (ref 24–39)
T4, Total: 11.8 ug/dL (ref 4.5–12.0)
TSH: 4.58 u[IU]/mL — ABNORMAL HIGH (ref 0.450–4.500)

## 2018-10-09 LAB — LIPID PANEL W/O CHOL/HDL RATIO
CHOLESTEROL TOTAL: 181 mg/dL (ref 100–199)
HDL: 71 mg/dL (ref >39)
LDL Calculated: 83 mg/dL (ref 0–99)
TRIGLYCERIDES: 135 mg/dL (ref 0–149)
VLDL Cholesterol Cal: 27 mg/dL (ref 5–40)

## 2018-10-27 ENCOUNTER — Other Ambulatory Visit: Payer: Self-pay

## 2018-10-27 ENCOUNTER — Inpatient Hospital Stay: Payer: BLUE CROSS/BLUE SHIELD

## 2018-10-27 ENCOUNTER — Inpatient Hospital Stay (HOSPITAL_BASED_OUTPATIENT_CLINIC_OR_DEPARTMENT_OTHER): Payer: BLUE CROSS/BLUE SHIELD | Admitting: Hospice and Palliative Medicine

## 2018-10-27 ENCOUNTER — Encounter: Payer: Self-pay | Admitting: Oncology

## 2018-10-27 ENCOUNTER — Inpatient Hospital Stay (HOSPITAL_BASED_OUTPATIENT_CLINIC_OR_DEPARTMENT_OTHER): Payer: BLUE CROSS/BLUE SHIELD | Admitting: Oncology

## 2018-10-27 VITALS — BP 136/78 | HR 82 | Temp 97.2°F | Wt 113.4 lb

## 2018-10-27 DIAGNOSIS — R5382 Chronic fatigue, unspecified: Secondary | ICD-10-CM

## 2018-10-27 DIAGNOSIS — C3411 Malignant neoplasm of upper lobe, right bronchus or lung: Secondary | ICD-10-CM

## 2018-10-27 DIAGNOSIS — C787 Secondary malignant neoplasm of liver and intrahepatic bile duct: Secondary | ICD-10-CM | POA: Diagnosis not present

## 2018-10-27 DIAGNOSIS — E785 Hyperlipidemia, unspecified: Secondary | ICD-10-CM | POA: Diagnosis not present

## 2018-10-27 DIAGNOSIS — C786 Secondary malignant neoplasm of retroperitoneum and peritoneum: Secondary | ICD-10-CM

## 2018-10-27 DIAGNOSIS — C7951 Secondary malignant neoplasm of bone: Secondary | ICD-10-CM

## 2018-10-27 DIAGNOSIS — C7972 Secondary malignant neoplasm of left adrenal gland: Secondary | ICD-10-CM | POA: Diagnosis not present

## 2018-10-27 DIAGNOSIS — D649 Anemia, unspecified: Secondary | ICD-10-CM

## 2018-10-27 DIAGNOSIS — Z515 Encounter for palliative care: Secondary | ICD-10-CM | POA: Diagnosis not present

## 2018-10-27 DIAGNOSIS — Z5112 Encounter for antineoplastic immunotherapy: Secondary | ICD-10-CM | POA: Diagnosis not present

## 2018-10-27 DIAGNOSIS — Z90710 Acquired absence of both cervix and uterus: Secondary | ICD-10-CM

## 2018-10-27 DIAGNOSIS — R531 Weakness: Secondary | ICD-10-CM

## 2018-10-27 DIAGNOSIS — E876 Hypokalemia: Secondary | ICD-10-CM

## 2018-10-27 DIAGNOSIS — G8929 Other chronic pain: Secondary | ICD-10-CM

## 2018-10-27 DIAGNOSIS — E039 Hypothyroidism, unspecified: Secondary | ICD-10-CM

## 2018-10-27 DIAGNOSIS — I10 Essential (primary) hypertension: Secondary | ICD-10-CM | POA: Diagnosis not present

## 2018-10-27 DIAGNOSIS — Z87891 Personal history of nicotine dependence: Secondary | ICD-10-CM

## 2018-10-27 DIAGNOSIS — Z79899 Other long term (current) drug therapy: Secondary | ICD-10-CM

## 2018-10-27 DIAGNOSIS — Z5111 Encounter for antineoplastic chemotherapy: Secondary | ICD-10-CM | POA: Diagnosis not present

## 2018-10-27 DIAGNOSIS — M1990 Unspecified osteoarthritis, unspecified site: Secondary | ICD-10-CM

## 2018-10-27 DIAGNOSIS — C3490 Malignant neoplasm of unspecified part of unspecified bronchus or lung: Secondary | ICD-10-CM

## 2018-10-27 DIAGNOSIS — R109 Unspecified abdominal pain: Secondary | ICD-10-CM | POA: Diagnosis not present

## 2018-10-27 LAB — COMPREHENSIVE METABOLIC PANEL
ALT: 21 U/L (ref 0–44)
AST: 32 U/L (ref 15–41)
Albumin: 4 g/dL (ref 3.5–5.0)
Alkaline Phosphatase: 80 U/L (ref 38–126)
Anion gap: 8 (ref 5–15)
BUN: 10 mg/dL (ref 8–23)
CO2: 25 mmol/L (ref 22–32)
Calcium: 8.9 mg/dL (ref 8.9–10.3)
Chloride: 104 mmol/L (ref 98–111)
Creatinine, Ser: 1.07 mg/dL — ABNORMAL HIGH (ref 0.44–1.00)
GFR calc non Af Amer: 55 mL/min — ABNORMAL LOW (ref >60)
Glucose, Bld: 88 mg/dL (ref 70–99)
Potassium: 3.2 mmol/L — ABNORMAL LOW (ref 3.5–5.1)
Sodium: 137 mmol/L (ref 135–145)
Total Bilirubin: 0.4 mg/dL (ref 0.3–1.2)
Total Protein: 7.7 g/dL (ref 6.5–8.1)

## 2018-10-27 LAB — CBC WITH DIFFERENTIAL/PLATELET
Abs Immature Granulocytes: 0.03 10*3/uL (ref 0.00–0.07)
BASOS ABS: 0 10*3/uL (ref 0.0–0.1)
Basophils Relative: 0 %
Eosinophils Absolute: 0 10*3/uL (ref 0.0–0.5)
Eosinophils Relative: 0 %
HCT: 29.2 % — ABNORMAL LOW (ref 36.0–46.0)
Hemoglobin: 9.5 g/dL — ABNORMAL LOW (ref 12.0–15.0)
Immature Granulocytes: 1 %
Lymphocytes Relative: 17 %
Lymphs Abs: 0.7 10*3/uL (ref 0.7–4.0)
MCH: 34.2 pg — ABNORMAL HIGH (ref 26.0–34.0)
MCHC: 32.5 g/dL (ref 30.0–36.0)
MCV: 105 fL — ABNORMAL HIGH (ref 80.0–100.0)
Monocytes Absolute: 0.5 10*3/uL (ref 0.1–1.0)
Monocytes Relative: 12 %
NRBC: 0 % (ref 0.0–0.2)
Neutro Abs: 2.8 10*3/uL (ref 1.7–7.7)
Neutrophils Relative %: 70 %
Platelets: 320 10*3/uL (ref 150–400)
RBC: 2.78 MIL/uL — AB (ref 3.87–5.11)
RDW: 13.3 % (ref 11.5–15.5)
WBC: 4.1 10*3/uL (ref 4.0–10.5)

## 2018-10-27 LAB — SAMPLE TO BLOOD BANK

## 2018-10-27 LAB — PROTEIN, URINE, RANDOM: Total Protein, Urine: 31 mg/dL

## 2018-10-27 MED ORDER — SODIUM CHLORIDE 0.9 % IV SOLN
500.0000 mg/m2 | Freq: Once | INTRAVENOUS | Status: AC
  Administered 2018-10-27: 800 mg via INTRAVENOUS
  Filled 2018-10-27: qty 20

## 2018-10-27 MED ORDER — HEPARIN SOD (PORK) LOCK FLUSH 100 UNIT/ML IV SOLN
500.0000 [IU] | Freq: Once | INTRAVENOUS | Status: AC | PRN
  Administered 2018-10-27: 500 [IU]
  Filled 2018-10-27: qty 5

## 2018-10-27 MED ORDER — ZOLEDRONIC ACID 4 MG/100ML IV SOLN
4.0000 mg | Freq: Once | INTRAVENOUS | Status: AC
  Administered 2018-10-27: 4 mg via INTRAVENOUS
  Filled 2018-10-27: qty 100

## 2018-10-27 MED ORDER — SODIUM CHLORIDE 0.9 % IV SOLN
Freq: Once | INTRAVENOUS | Status: AC
  Administered 2018-10-27: 10:00:00 via INTRAVENOUS
  Filled 2018-10-27: qty 5

## 2018-10-27 MED ORDER — SODIUM CHLORIDE 0.9 % IV SOLN
Freq: Once | INTRAVENOUS | Status: AC
  Administered 2018-10-27: 10:00:00 via INTRAVENOUS
  Filled 2018-10-27: qty 250

## 2018-10-27 MED ORDER — SODIUM CHLORIDE 0.9 % IV SOLN
800.0000 mg | Freq: Once | INTRAVENOUS | Status: AC
  Administered 2018-10-27: 800 mg via INTRAVENOUS
  Filled 2018-10-27: qty 32

## 2018-10-27 MED ORDER — PALONOSETRON HCL INJECTION 0.25 MG/5ML
0.2500 mg | Freq: Once | INTRAVENOUS | Status: AC
  Administered 2018-10-27: 0.25 mg via INTRAVENOUS
  Filled 2018-10-27: qty 5

## 2018-10-27 MED ORDER — CYANOCOBALAMIN 1000 MCG/ML IJ SOLN
1000.0000 ug | Freq: Once | INTRAMUSCULAR | Status: AC
  Administered 2018-10-27: 1000 ug via INTRAMUSCULAR
  Filled 2018-10-27: qty 1

## 2018-10-27 NOTE — Progress Notes (Signed)
Patient here today for follow up.  Patient states no new concerns today  

## 2018-10-27 NOTE — Progress Notes (Signed)
[place]  [phone] [phone]   Name: Janet [name] Date: 10/27/2018 MRN: [id number]  DOB: 1956-03-27  Patient Care Team: Volney American, PA-C as PCP - General (Family Medicine) [name], RN as Registered Nurse    REASON FOR CONSULTATION: Palliative Care consult requested for this 63 y.o. 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  Palliative care was asked to help support patient through treatment and address goals of care.   SOCIAL HISTORY:    Patient is married but separated.  Patient lives alone.  She has a son and daughter.  Patient used to work on an Designer, television/film set.  ADVANCE DIRECTIVES:  Does not have  CODE STATUS: Full code  PAST MEDICAL HISTORY: Past Medical History:  Diagnosis Date  . Arthritis    right shoulder  . Cancer of right lung ([place]) 08/2017   Chemo + rad tx's.   Marland Kitchen Hyperlipidemia   . Hypertension   . Hypothyroidism   . Menopausal state   . Personal history of chemotherapy 2019   lung ca  . Personal history of radiation therapy 2019   lung ca  . Thyroid disease     PAST SURGICAL HISTORY:  Past Surgical History:  Procedure Laterality Date  . ABDOMINAL HYSTERECTOMY  2005  . BREAST EXCISIONAL BIOPSY Right 1979   benign  . COLONOSCOPY WITH PROPOFOL N/A 05/13/2017   Procedure: COLONOSCOPY WITH PROPOFOL;  Surgeon: [name], MD;  Location: [facility] ENDOSCOPY;  Service: Gastroenterology;  Laterality: N/A;  . CYSTECTOMY Right    breast  . CYSTECTOMY  02/2015   back of neck  . ESOPHAGOGASTRODUODENOSCOPY (EGD) WITH PROPOFOL N/A 05/13/2017   Procedure: ESOPHAGOGASTRODUODENOSCOPY (EGD) WITH PROPOFOL;  Surgeon: [name], MD;  Location: [facility] ENDOSCOPY;  Service: Gastroenterology;  Laterality: N/A;  . PORTA CATH  INSERTION N/A 09/01/2017   Procedure: PORTA CATH INSERTION;  Surgeon: Algernon Huxley, MD;  Location: [place] CV LAB;  Service: Cardiovascular;  Laterality: N/A;  . TUBAL LIGATION  1986    HEMATOLOGY/ONCOLOGY HISTORY:    Cancer of upper lobe of right lung ([place])   07/09/2017 Initial Diagnosis    Cancer of upper lobe of right lung ([place])    04/01/2018 -  Chemotherapy    The patient had palonosetron (ALOXI) injection 0.25 mg, 0.25 mg, Intravenous,  Once, 11 of 12 cycles Administration: 0.25 mg (04/01/2018), 0.25 mg (04/22/2018), 0.25 mg (05/13/2018), 0.25 mg (06/03/2018), 0.25 mg (06/23/2018), 0.25 mg (07/14/2018), 0.25 mg (08/04/2018), 0.25 mg (08/25/2018), 0.25 mg (09/15/2018), 0.25 mg (10/06/2018), 0.25 mg (10/27/2018) bevacizumab (AVASTIN) 900 mg in sodium chloride 0.9 % 100 mL chemo infusion, 925 mg, Intravenous,  Once, 11 of 12 cycles Administration: 900 mg (04/01/2018), 900 mg (04/22/2018), 800 mg (06/03/2018), 800 mg (06/23/2018), 800 mg (07/14/2018), 800 mg (08/04/2018), 800 mg (08/25/2018), 800 mg (09/15/2018), 800 mg (10/06/2018), 800 mg (10/27/2018) PEMEtrexed (ALIMTA) 800 mg in sodium chloride 0.9 % 100 mL chemo infusion, 800 mg, Intravenous,  Once, 11 of 12 cycles Administration: 800 mg (04/01/2018), 800 mg (04/22/2018), 800 mg (05/13/2018), 800 mg (06/03/2018), 800 mg (06/23/2018), 800 mg (07/14/2018), 800 mg (08/04/2018), 800 mg (08/25/2018), 800 mg (09/15/2018), 800 mg (10/06/2018), 800 mg (10/27/2018) CARBOplatin (PARAPLATIN) 480 mg in sodium chloride 0.9 % 250 mL chemo infusion, 480 mg (100 % of original dose 480.5 mg), Intravenous,  Once,  8 of 8 cycles Dose modification:   (original dose 480.5 mg, Cycle 1) Administration: 480 mg (04/01/2018), 480 mg (04/22/2018), 480 mg (05/13/2018), 400 mg (06/03/2018), 440 mg (06/23/2018), 440 mg (07/14/2018), 440 mg (08/04/2018), 430 mg (08/25/2018) fosaprepitant (EMEND) 150 mg, dexamethasone (DECADRON) 12 mg in sodium chloride 0.9 % 145 mL IVPB, , Intravenous,  Once,  11 of 12 cycles Administration:  (04/01/2018),  (04/22/2018),  (05/13/2018),  (06/03/2018),  (06/23/2018),  (07/14/2018),  (08/04/2018),  (08/25/2018),  (09/15/2018),  (10/06/2018),  (10/27/2018)  for chemotherapy treatment.      ALLERGIES:  has No Known Allergies.  MEDICATIONS:  Current Outpatient Medications  Medication Sig Dispense Refill  . amLODipine (NORVASC) 5 MG tablet TAKE 1 TABLET BY MOUTH DAILY 30 tablet 0  . atorvastatin (LIPITOR) 10 MG tablet TAKE 1 TABLET DAILY AT 6 P.M. 10 tablet 0  . DULoxetine (CYMBALTA) 30 MG capsule Take 1 capsule (30 mg total) by mouth daily. 30 capsule 3  . folic acid (FOLVITE) 1 MG tablet Take 1 tablet (1 mg total) by mouth daily. 90 tablet 4  . levothyroxine (SYNTHROID, LEVOTHROID) 50 MCG tablet TAKE 1 TABLET DAILY 90 tablet 3  . lidocaine-prilocaine (EMLA) cream APP EXT AA 1 TIME  3  . Oxycodone HCl 10 MG TABS Take 1 tablet (10 mg total) by mouth every 6 (six) hours as needed. 120 tablet 0   No current facility-administered medications for this visit.     VITAL SIGNS: LMP 08/08/2003 (Approximate) Comment: Hysterectomy 2004 There were no vitals filed for this visit.  Estimated body mass index is 22.07 kg/m as calculated from the following:   Height as of 10/08/18: 5' 0.1" (1.527 m).   Weight as of an earlier encounter on 10/27/18: 113 lb 6 oz (51.4 kg).  LABS: CBC:    Component Value Date/Time   WBC 4.1 10/27/2018 0812   HGB 9.5 (L) 10/27/2018 0812   HGB 10.8 (L) 07/09/2017 1615   HCT 29.2 (L) 10/27/2018 0812   HCT 32.7 (L) 07/09/2017 1615   PLT 320 10/27/2018 0812   PLT 394 (H) 07/09/2017 1615   MCV 105.0 (H) 10/27/2018 0812   MCV 92 07/09/2017 1615   NEUTROABS 2.8 10/27/2018 0812   NEUTROABS 2.5 07/09/2017 1615   LYMPHSABS 0.7 10/27/2018 0812   LYMPHSABS 3.1 07/09/2017 1615   MONOABS 0.5 10/27/2018 0812   EOSABS 0.0 10/27/2018 0812   EOSABS 0.1 07/09/2017 1615   BASOSABS 0.0 10/27/2018 0812   BASOSABS 0.0 07/09/2017 1615    Comprehensive Metabolic Panel:    Component Value Date/Time   NA 137 10/27/2018 0812   NA 147 (H) 07/09/2017 1615   K 3.2 (L) 10/27/2018 0812   CL 104 10/27/2018 0812   CO2 25 10/27/2018 0812   BUN 10 10/27/2018 0812   BUN 8 07/09/2017 1615   CREATININE 1.07 (H) 10/27/2018 0812   GLUCOSE 88 10/27/2018 0812   CALCIUM 8.9 10/27/2018 0812   AST 32 10/27/2018 0812   ALT 21 10/27/2018 0812   ALKPHOS 80 10/27/2018 0812   BILITOT 0.4 10/27/2018 0812   BILITOT <0.2 07/09/2017 1615   PROT 7.7 10/27/2018 0812   PROT 6.6 07/09/2017 1615   ALBUMIN 4.0 10/27/2018 0812   ALBUMIN 4.3 07/09/2017 1615    RADIOGRAPHIC STUDIES: No results found.  PERFORMANCE STATUS (ECOG) : 1 - Symptomatic but completely ambulatory  Review of Systems As noted above. Otherwise, a complete review of systems is negative.  Physical Exam General: NAD, frail appearing, thin Pulmonary: CTA  ant fields Cardiac: RRR ABD: soft, nttp Extremities: no edema, no joint deformities Skin: no rashes Neurological: Weakness but otherwise nonfocal  IMPRESSION: Follow up visit today with patient regarding her pain. Patient was seen in the infusion area.   Patient says pain is stable. Continue Cymbalta 30mg  with consideration for increasing to 60mg  daily if needed.   Patient denies any other acute changes or concerns. She reports eating well.   I have previously reviewed with patient a MOST form. Will benefit from completing on Vynca when patient is next seen in exam room.    PLAN: -Continue oxycodone as needed for pain -Continue duloxetine 30mg  daily. Consider increasing to 60mg  if needed -MOST form on Vynca when patient seen in exam room -RTC in 1 month  Patient expressed understanding and was in agreement with this plan. She also understands that She can call clinic at any time with any questions, concerns, or complaints.    Time Total: 15 minutes  Visit consisted of counseling and education dealing with the  complex and emotionally intense issues of symptom management and palliative care in the setting of serious and potentially life-threatening illness.Greater than 50%  of this time was spent counseling and coordinating care related to the above assessment and plan.  Signed by: Altha Harm, PhD, DNP, NP-C, [facility] [phone] (Work Cell)

## 2018-10-28 NOTE — Progress Notes (Signed)
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    INTERVAL HISTORY: Patient returns to clinic today for further evaluation and consideration of cycle 11 of maintenance pemetrexed and Avastin.  She currently feels well and is asymptomatic.  She is tolerating her treatments well without significant side effects.  She does not complain of pain today. She continues to have chronic weakness and fatigue.  She has no neurologic complaints.  She denies any recent fevers or illnesses. She denies any chest pain, shortness of breath, hemoptysis, or cough.  She has no nausea, vomiting, constipation, or diarrhea.  She has no melena or hematochezia.  She has no urinary complaints.  Patient offers no further specific complaints today.  REVIEW OF SYSTEMS:   Review of Systems  Constitutional: Positive for malaise/fatigue. Negative for fever and weight loss.  Respiratory: Negative.  Negative for cough and shortness of breath.   Cardiovascular: Negative.  Negative for chest pain and leg swelling.  Gastrointestinal: Negative.  Negative for abdominal pain, blood in stool and melena.  Genitourinary: Negative.  Negative for dysuria and flank pain.  Musculoskeletal: Negative for back pain and joint pain.  Skin: Negative.  Negative for rash.  Neurological: Positive for weakness. Negative for sensory change, focal weakness and headaches.  Psychiatric/Behavioral: Negative.  The patient is not nervous/anxious.     As per HPI. Otherwise, a complete review of systems is negative.  PAST MEDICAL HISTORY: Past Medical History:  Diagnosis Date  . Arthritis    right shoulder  . Cancer of  right lung ([place]) 08/2017   Chemo + rad tx's.   Marland Kitchen Hyperlipidemia   . Hypertension   . Hypothyroidism   . Menopausal state   . Personal history of chemotherapy 2019   lung ca  . Personal history of radiation therapy 2019   lung ca  . Thyroid disease     PAST SURGICAL HISTORY: Past Surgical History:  Procedure Laterality Date  . ABDOMINAL HYSTERECTOMY  2005  . BREAST EXCISIONAL BIOPSY Right 1979   benign  . COLONOSCOPY WITH PROPOFOL N/A 05/13/2017   Procedure: COLONOSCOPY WITH PROPOFOL;  Surgeon: [name], MD;  Location[facility] ENDOSCOPY;  Service: Gastroenterology;  Laterality: N/A;  . CYSTECTOMY Right    breast  . CYSTECTOMY  02/2015   back of neck  . ESOPHAGOGASTRODUODENOSCOPY (EGD) WITH PROPOFOL N/A 05/13/2017   Procedure: ESOPHAGOGASTRODUODENOSCOPY (EGD) WITH PROPOFOL;  Surgeon: [name], MD;  Location: [facility] ENDOSCOPY;  Service: Gastroenterology;  Laterality: N/A;  . PORTA CATH INSERTION N/A 09/01/2017   Procedure: PORTA CATH INSERTION;  Surgeon: Algernon Huxley, MD;  Location: [place] CV LAB;  Service: Cardiovascular;  Laterality: N/A;  . TUBAL LIGATION  1986    FAMILY HISTORY: Family History  Problem Relation Age of Onset  . Hypertension Mother   . Stroke Mother   . Leukemia Mother   . Stroke Father   . Pneumonia Father   . Diabetes Sister   . Hyperlipidemia Sister   . Breast cancer Maternal Aunt 92    ADVANCED DIRECTIVES (Y/N):  N  HEALTH MAINTENANCE: Social History   Tobacco Use  . Smoking status: Former Smoker    Packs/day: 0.25    Types: Cigarettes  Last attempt to quit: 02/01/2017    Years since quitting: 1.7  . Smokeless tobacco: Never Used  Substance Use Topics  . Alcohol use: No    Alcohol/week: 0.0 standard drinks  . Drug use: No     Colonoscopy:  PAP:  Bone density:  Lipid panel:  No Known Allergies  Current Outpatient Medications  Medication Sig Dispense Refill  . amLODipine (NORVASC) 5 MG tablet TAKE 1 TABLET BY MOUTH DAILY  30 tablet 0  . atorvastatin (LIPITOR) 10 MG tablet TAKE 1 TABLET DAILY AT 6 P.M. 10 tablet 0  . DULoxetine (CYMBALTA) 30 MG capsule Take 1 capsule (30 mg total) by mouth daily. 30 capsule 3  . folic acid (FOLVITE) 1 MG tablet Take 1 tablet (1 mg total) by mouth daily. 90 tablet 4  . levothyroxine (SYNTHROID, LEVOTHROID) 50 MCG tablet TAKE 1 TABLET DAILY 90 tablet 3  . lidocaine-prilocaine (EMLA) cream APP EXT AA 1 TIME  3  . Oxycodone HCl 10 MG TABS Take 1 tablet (10 mg total) by mouth every 6 (six) hours as needed. 120 tablet 0   No current facility-administered medications for this visit.     OBJECTIVE: Vitals:   10/27/18 0846  BP: 136/78  Pulse: 82  Temp: (!) 97.2 F (36.2 C)     Body mass index is 22.07 kg/m.    ECOG FS:0 - Asymptomatic  General: Well-developed, well-nourished, no acute distress. Eyes: Pink conjunctiva, anicteric sclera. HEENT: Normocephalic, moist mucous membranes. Lungs: Clear to auscultation bilaterally. Heart: Regular rate and rhythm. No rubs, murmurs, or gallops. Abdomen: Soft, nontender, nondistended. No organomegaly noted, normoactive bowel sounds. Musculoskeletal: No edema, cyanosis, or clubbing. Neuro: Alert, answering all questions appropriately. Cranial nerves grossly intact. Skin: No rashes or petechiae noted. Psych: Normal affect.  LAB RESULTS:  Lab Results  Component Value Date   NA 137 10/27/2018   K 3.2 (L) 10/27/2018   CL 104 10/27/2018   CO2 25 10/27/2018   GLUCOSE 88 10/27/2018   BUN 10 10/27/2018   CREATININE 1.07 (H) 10/27/2018   CALCIUM 8.9 10/27/2018   PROT 7.7 10/27/2018   ALBUMIN 4.0 10/27/2018   AST 32 10/27/2018   ALT 21 10/27/2018   ALKPHOS 80 10/27/2018   BILITOT 0.4 10/27/2018   GFRNONAA 55 (L) 10/27/2018   GFRAA >60 10/27/2018    Lab Results  Component Value Date   WBC 4.1 10/27/2018   NEUTROABS 2.8 10/27/2018   HGB 9.5 (L) 10/27/2018   HCT 29.2 (L) 10/27/2018   MCV 105.0 (H) 10/27/2018   PLT 320  10/27/2018     STUDIES: No results found.  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     PLAN:    1. 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  Nuclear med bone scan on the same day was essentially unchanged.  OmniSeq testing did not reveal any actionable mutations.  Patient wishes to continue with palliative treatment.  Proceed with cycle 11 of maintenance pemetrexed and Avastin today.  Patient also receive Zometa on the odd-numbered cycles. She will require B12 injections and folate supplement along with her pemetrexed.  Return to clinic in 3 weeks for further evaluation and consideration of cycle 12.  Plan to reimage after cycle 12. 2.  Anemia: Hemoglobin improved to 9.5, monitor.  4.  Pain: Chronic and unchanged.  Patient does not wish to change her narcotic regimen at this time. 5.  Palliative care: Appreciate input. 6.  Left shoulder mobility: Appreciate rehab input. 7.  Hypokalemia: Potassium is 3.2 today.  Consider oral potassium supplementation.  Patient was previously given dietary recommendations.  Patient expressed understanding and was in agreement with this plan. She also understands that She can call clinic at any time with any questions, concerns, or complaints.   Cancer Staging Cancer of upper lobe of right lung [facility]) Staging form: Lung, AJCC 8th Edition - Clinical stage from 08/24/2017: Stage IV (cT2a, cN3, cM1c) - Signed by Lloyd Huger, MD on 03/29/2018   Lloyd Huger, MD   10/28/2018 6:26 AM

## 2018-11-15 NOTE — Progress Notes (Signed)
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    INTERVAL HISTORY: Patient returns to clinic today for further evaluation and consideration of cycle 12 of maintenance pemetrexed and Avastin.  She continues to feel well and remains asymptomatic.  She is tolerating her treatments without significant side effects. She does not complain of pain today. She continues to have chronic weakness and fatigue.  She has no neurologic complaints.  She denies any recent fevers or illnesses. She denies any chest pain, shortness of breath, hemoptysis, or cough.  She has no nausea, vomiting, constipation, or diarrhea.  She has no melena or hematochezia.  She has no urinary complaints.  Patient feels at her baseline offers no further specific complaints today.  REVIEW OF SYSTEMS:   Review of Systems  Constitutional: Positive for malaise/fatigue. Negative for fever and weight loss.  Respiratory: Negative.  Negative for cough and shortness of breath.   Cardiovascular: Negative.  Negative for chest pain and leg swelling.  Gastrointestinal: Negative.  Negative for abdominal pain, blood in stool and melena.  Genitourinary: Negative.  Negative for dysuria and flank pain.  Musculoskeletal: Negative for back pain and joint pain.  Skin: Negative.  Negative for rash.  Neurological: Positive for weakness. Negative for sensory change, focal weakness and headaches.  Psychiatric/Behavioral: Negative.  The patient is not nervous/anxious.     As per HPI. Otherwise, a complete review of systems is negative.  PAST MEDICAL HISTORY: Past Medical History:  Diagnosis Date  . Arthritis    right  shoulder  . Cancer of right lung ([place]) 08/2017   Chemo + rad tx's.   Marland Kitchen Hyperlipidemia   . Hypertension   . Hypothyroidism   . Menopausal state   . Personal history of chemotherapy 2019   lung ca  . Personal history of radiation therapy 2019   lung ca  . Thyroid disease     PAST SURGICAL HISTORY: Past Surgical History:  Procedure Laterality Date  . ABDOMINAL HYSTERECTOMY  2005  . BREAST EXCISIONAL BIOPSY Right 1979   benign  . COLONOSCOPY WITH PROPOFOL N/A 05/13/2017   Procedure: COLONOSCOPY WITH PROPOFOL;  Surgeon: [name], MD;  Location: [facility] ENDOSCOPY;  Service: Gastroenterology;  Laterality: N/A;  . CYSTECTOMY Right    breast  . CYSTECTOMY  02/2015   back of neck  . ESOPHAGOGASTRODUODENOSCOPY (EGD) WITH PROPOFOL N/A 05/13/2017   Procedure: ESOPHAGOGASTRODUODENOSCOPY (EGD) WITH PROPOFOL;  Surgeon: [name], MD;  Location: [facility] ENDOSCOPY;  Service: Gastroenterology;  Laterality: N/A;  . PORTA CATH INSERTION N/A 09/01/2017   Procedure: PORTA CATH INSERTION;  Surgeon: Algernon Huxley, MD;  Location: [place] CV LAB;  Service: Cardiovascular;  Laterality: N/A;  . TUBAL LIGATION  1986    FAMILY HISTORY: Family History  Problem Relation Age of Onset  . Hypertension Mother   . Stroke Mother   . Leukemia Mother   . Stroke Father   . Pneumonia Father   . Diabetes Sister   . Hyperlipidemia Sister   . Breast cancer Maternal Aunt 43    ADVANCED DIRECTIVES (Y/N):  N  HEALTH MAINTENANCE: Social History   Tobacco Use  . Smoking status: Former Smoker    Packs/day: 0.25  Types: Cigarettes    Last attempt to quit: 02/01/2017    Years since quitting: 1.7  . Smokeless tobacco: Never Used  Substance Use Topics  . Alcohol use: No    Alcohol/week: 0.0 standard drinks  . Drug use: No     Colonoscopy:  PAP:  Bone density:  Lipid panel:  No Known Allergies  Current Outpatient Medications  Medication Sig Dispense Refill  . amLODipine (NORVASC) 5 MG tablet TAKE 1  TABLET BY MOUTH DAILY 30 tablet 0  . atorvastatin (LIPITOR) 10 MG tablet TAKE 1 TABLET DAILY AT 6 P.M. 10 tablet 0  . DULoxetine (CYMBALTA) 30 MG capsule Take 1 capsule (30 mg total) by mouth daily. 30 capsule 3  . folic acid (FOLVITE) 1 MG tablet Take 1 tablet (1 mg total) by mouth daily. 90 tablet 4  . levothyroxine (SYNTHROID, LEVOTHROID) 50 MCG tablet TAKE 1 TABLET DAILY 90 tablet 3  . lidocaine-prilocaine (EMLA) cream APP EXT AA 1 TIME  3  . Oxycodone HCl 10 MG TABS Take 1 tablet (10 mg total) by mouth every 6 (six) hours as needed. (Patient not taking: Reported on 11/17/2018) 120 tablet 0   No current facility-administered medications for this visit.    Facility-Administered Medications Ordered in Other Visits  Medication Dose Route Frequency Provider Last Rate Last Dose  . bevacizumab (AVASTIN) 800 mg in sodium chloride 0.9 % 100 mL chemo infusion  800 mg Intravenous Once Lloyd Huger, MD      . fosaprepitant (EMEND) 150 mg, dexamethasone (DECADRON) 12 mg in sodium chloride 0.9 % 145 mL IVPB   Intravenous Once Lloyd Huger, MD      . heparin lock flush 100 unit/mL  500 Units Intracatheter Once PRN Lloyd Huger, MD      . PEMEtrexed (ALIMTA) 800 mg in sodium chloride 0.9 % 100 mL chemo infusion  500 mg/m2 (Treatment Plan Recorded) Intravenous Once Lloyd Huger, MD      . potassium chloride 40 mEq in sodium chloride 0.9 % 270 mL (0.1481 mEq/mL) infusion  40 mEq Intravenous Once Lloyd Huger, MD 135 mL/hr at 11/17/18 1015 40 mEq at 11/17/18 1015    OBJECTIVE: Vitals:   11/17/18 0907  BP: (!) 156/77  Pulse: 80  Temp: 98.7 F (37.1 C)     Body mass index is 21.7 kg/m.    ECOG FS:0 - Asymptomatic  General: Well-developed, well-nourished, no acute distress. Eyes: Pink conjunctiva, anicteric sclera. HEENT: Normocephalic, moist mucous membranes. Lungs: Clear to auscultation bilaterally. Heart: Regular rate and rhythm. No rubs, murmurs, or gallops.  Abdomen: Soft, nontender, nondistended. No organomegaly noted, normoactive bowel sounds. Musculoskeletal: No edema, cyanosis, or clubbing. Neuro: Alert, answering all questions appropriately. Cranial nerves grossly intact. Skin: No rashes or petechiae noted. Psych: Normal affect.  LAB RESULTS:  Lab Results  Component Value Date   NA 136 11/17/2018   K 2.8 (L) 11/17/2018   CL 102 11/17/2018   CO2 25 11/17/2018   GLUCOSE 130 (H) 11/17/2018   BUN 8 11/17/2018   CREATININE 1.17 (H) 11/17/2018   CALCIUM 8.5 (L) 11/17/2018   PROT 7.3 11/17/2018   ALBUMIN 3.8 11/17/2018   AST 28 11/17/2018   ALT 13 11/17/2018   ALKPHOS 83 11/17/2018   BILITOT 0.2 (L) 11/17/2018   GFRNONAA 50 (L) 11/17/2018   GFRAA 57 (L) 11/17/2018    Lab Results  Component Value Date   WBC 3.5 (L) 11/17/2018   NEUTROABS 2.5 11/17/2018   HGB  9.0 (L) 11/17/2018   HCT 27.6 (L) 11/17/2018   MCV 102.6 (H) 11/17/2018   PLT 274 11/17/2018     STUDIES: No results found.  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     PLAN:    1. 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  Nuclear med bone scan on the same day was essentially unchanged.  OmniSeq testing did not reveal any actionable mutations.  Patient wishes to continue with palliative treatment.  Proceed with cycle 12 of maintenance pemetrexed and Avastin today.  Patient also receives Zometa on the odd-numbered cycles. She will require B12 injections and folate supplement along with her pemetrexed.  Return to clinic in 3 weeks for further evaluation and consideration of cycle 13.  Plan to reimage with CT scan and bone scan prior to next treatment. 2.  Anemia: Hemoglobin trending down a little bit currently is 9.0.   Monitor. 3.  Pain: Patient does not complain of pain today.  Continue current narcotic regimen as prescribed. 4.  Palliative care: Appreciate input. 5.  Left shoulder mobility: Appreciate rehab input. 6.  Hypokalemia: Potassium 2.8 today.  Proceed with 40 mEq IV potassium.  Continue oral potassium supplementation as prescribed.  Patient expressed understanding and was in agreement with this plan. She also understands that She can call clinic at any time with any questions, concerns, or complaints.   Cancer Staging Cancer of upper lobe of right lung [facility]) Staging form: Lung, AJCC 8th Edition - Clinical stage from 08/24/2017: Stage IV (cT2a, cN3, cM1c) - Signed by Lloyd Huger, MD on 03/29/2018   Lloyd Huger, MD   11/17/2018 10:27 AM

## 2018-11-17 ENCOUNTER — Inpatient Hospital Stay (HOSPITAL_BASED_OUTPATIENT_CLINIC_OR_DEPARTMENT_OTHER): Payer: BLUE CROSS/BLUE SHIELD | Admitting: Oncology

## 2018-11-17 ENCOUNTER — Inpatient Hospital Stay: Payer: BLUE CROSS/BLUE SHIELD

## 2018-11-17 ENCOUNTER — Encounter: Payer: Self-pay | Admitting: Oncology

## 2018-11-17 ENCOUNTER — Other Ambulatory Visit: Payer: Self-pay

## 2018-11-17 ENCOUNTER — Inpatient Hospital Stay: Payer: BLUE CROSS/BLUE SHIELD | Attending: Oncology

## 2018-11-17 VITALS — BP 156/77 | HR 80 | Temp 98.7°F | Ht 60.0 in | Wt 111.1 lb

## 2018-11-17 DIAGNOSIS — Z5112 Encounter for antineoplastic immunotherapy: Secondary | ICD-10-CM | POA: Insufficient documentation

## 2018-11-17 DIAGNOSIS — C7951 Secondary malignant neoplasm of bone: Secondary | ICD-10-CM

## 2018-11-17 DIAGNOSIS — C7801 Secondary malignant neoplasm of right lung: Secondary | ICD-10-CM

## 2018-11-17 DIAGNOSIS — E039 Hypothyroidism, unspecified: Secondary | ICD-10-CM | POA: Insufficient documentation

## 2018-11-17 DIAGNOSIS — C3411 Malignant neoplasm of upper lobe, right bronchus or lung: Secondary | ICD-10-CM | POA: Insufficient documentation

## 2018-11-17 DIAGNOSIS — I10 Essential (primary) hypertension: Secondary | ICD-10-CM | POA: Insufficient documentation

## 2018-11-17 DIAGNOSIS — C3490 Malignant neoplasm of unspecified part of unspecified bronchus or lung: Secondary | ICD-10-CM

## 2018-11-17 DIAGNOSIS — Z5111 Encounter for antineoplastic chemotherapy: Secondary | ICD-10-CM | POA: Diagnosis not present

## 2018-11-17 DIAGNOSIS — C787 Secondary malignant neoplasm of liver and intrahepatic bile duct: Secondary | ICD-10-CM | POA: Insufficient documentation

## 2018-11-17 DIAGNOSIS — E785 Hyperlipidemia, unspecified: Secondary | ICD-10-CM | POA: Diagnosis not present

## 2018-11-17 DIAGNOSIS — E876 Hypokalemia: Secondary | ICD-10-CM

## 2018-11-17 DIAGNOSIS — Z79899 Other long term (current) drug therapy: Secondary | ICD-10-CM

## 2018-11-17 DIAGNOSIS — Z95828 Presence of other vascular implants and grafts: Secondary | ICD-10-CM

## 2018-11-17 DIAGNOSIS — Z87891 Personal history of nicotine dependence: Secondary | ICD-10-CM | POA: Insufficient documentation

## 2018-11-17 DIAGNOSIS — D649 Anemia, unspecified: Secondary | ICD-10-CM | POA: Diagnosis not present

## 2018-11-17 DIAGNOSIS — M1990 Unspecified osteoarthritis, unspecified site: Secondary | ICD-10-CM | POA: Insufficient documentation

## 2018-11-17 LAB — CBC WITH DIFFERENTIAL/PLATELET
Abs Immature Granulocytes: 0.02 10*3/uL (ref 0.00–0.07)
Basophils Absolute: 0 10*3/uL (ref 0.0–0.1)
Basophils Relative: 0 %
Eosinophils Absolute: 0 10*3/uL (ref 0.0–0.5)
Eosinophils Relative: 0 %
HCT: 27.6 % — ABNORMAL LOW (ref 36.0–46.0)
Hemoglobin: 9 g/dL — ABNORMAL LOW (ref 12.0–15.0)
Immature Granulocytes: 1 %
Lymphocytes Relative: 16 %
Lymphs Abs: 0.6 10*3/uL — ABNORMAL LOW (ref 0.7–4.0)
MCH: 33.5 pg (ref 26.0–34.0)
MCHC: 32.6 g/dL (ref 30.0–36.0)
MCV: 102.6 fL — ABNORMAL HIGH (ref 80.0–100.0)
Monocytes Absolute: 0.4 10*3/uL (ref 0.1–1.0)
Monocytes Relative: 13 %
Neutro Abs: 2.5 10*3/uL (ref 1.7–7.7)
Neutrophils Relative %: 70 %
Platelets: 274 10*3/uL (ref 150–400)
RBC: 2.69 MIL/uL — ABNORMAL LOW (ref 3.87–5.11)
RDW: 13.5 % (ref 11.5–15.5)
WBC: 3.5 10*3/uL — ABNORMAL LOW (ref 4.0–10.5)
nRBC: 0 % (ref 0.0–0.2)

## 2018-11-17 LAB — COMPREHENSIVE METABOLIC PANEL
ALT: 13 U/L (ref 0–44)
AST: 28 U/L (ref 15–41)
Albumin: 3.8 g/dL (ref 3.5–5.0)
Alkaline Phosphatase: 83 U/L (ref 38–126)
Anion gap: 9 (ref 5–15)
BUN: 8 mg/dL (ref 8–23)
CO2: 25 mmol/L (ref 22–32)
Calcium: 8.5 mg/dL — ABNORMAL LOW (ref 8.9–10.3)
Chloride: 102 mmol/L (ref 98–111)
Creatinine, Ser: 1.17 mg/dL — ABNORMAL HIGH (ref 0.44–1.00)
GFR calc Af Amer: 57 mL/min — ABNORMAL LOW (ref >60)
GFR calc non Af Amer: 50 mL/min — ABNORMAL LOW (ref >60)
Glucose, Bld: 130 mg/dL — ABNORMAL HIGH (ref 70–99)
Potassium: 2.8 mmol/L — ABNORMAL LOW (ref 3.5–5.1)
Sodium: 136 mmol/L (ref 135–145)
Total Bilirubin: 0.2 mg/dL — ABNORMAL LOW (ref 0.3–1.2)
Total Protein: 7.3 g/dL (ref 6.5–8.1)

## 2018-11-17 LAB — SAMPLE TO BLOOD BANK

## 2018-11-17 LAB — PROTEIN, URINE, RANDOM: Total Protein, Urine: 45 mg/dL

## 2018-11-17 MED ORDER — SODIUM CHLORIDE 0.9 % IV SOLN
Freq: Once | INTRAVENOUS | Status: AC
  Administered 2018-11-17: 12:00:00 via INTRAVENOUS
  Filled 2018-11-17: qty 5

## 2018-11-17 MED ORDER — CYANOCOBALAMIN 1000 MCG/ML IJ SOLN
1000.0000 ug | Freq: Once | INTRAMUSCULAR | Status: AC
  Administered 2018-11-17: 1000 ug via INTRAMUSCULAR
  Filled 2018-11-17: qty 1

## 2018-11-17 MED ORDER — SODIUM CHLORIDE 0.9 % IV SOLN
40.0000 meq | Freq: Once | INTRAVENOUS | Status: AC
  Administered 2018-11-17: 40 meq via INTRAVENOUS
  Filled 2018-11-17: qty 20

## 2018-11-17 MED ORDER — SODIUM CHLORIDE 0.9 % IV SOLN
500.0000 mg/m2 | Freq: Once | INTRAVENOUS | Status: AC
  Administered 2018-11-17: 800 mg via INTRAVENOUS
  Filled 2018-11-17: qty 12

## 2018-11-17 MED ORDER — SODIUM CHLORIDE 0.9 % IV SOLN
Freq: Once | INTRAVENOUS | Status: AC
  Administered 2018-11-17: 10:00:00 via INTRAVENOUS
  Filled 2018-11-17: qty 250

## 2018-11-17 MED ORDER — SODIUM CHLORIDE 0.9% FLUSH
10.0000 mL | Freq: Once | INTRAVENOUS | Status: AC
  Administered 2018-11-17: 10 mL via INTRAVENOUS
  Filled 2018-11-17: qty 10

## 2018-11-17 MED ORDER — HEPARIN SOD (PORK) LOCK FLUSH 100 UNIT/ML IV SOLN
500.0000 [IU] | Freq: Once | INTRAVENOUS | Status: AC | PRN
  Administered 2018-11-17: 500 [IU]
  Filled 2018-11-17: qty 5

## 2018-11-17 MED ORDER — PALONOSETRON HCL INJECTION 0.25 MG/5ML
0.2500 mg | Freq: Once | INTRAVENOUS | Status: AC
  Administered 2018-11-17: 0.25 mg via INTRAVENOUS
  Filled 2018-11-17: qty 5

## 2018-11-17 MED ORDER — SODIUM CHLORIDE 0.9 % IV SOLN
800.0000 mg | Freq: Once | INTRAVENOUS | Status: AC
  Administered 2018-11-17: 800 mg via INTRAVENOUS
  Filled 2018-11-17: qty 32

## 2018-11-17 NOTE — Progress Notes (Signed)
Patient stated that she had been doing well with no complaints. 

## 2018-11-19 ENCOUNTER — Other Ambulatory Visit: Payer: Self-pay

## 2018-11-19 ENCOUNTER — Other Ambulatory Visit: Payer: BLUE CROSS/BLUE SHIELD

## 2018-11-19 ENCOUNTER — Other Ambulatory Visit: Payer: Self-pay | Admitting: Nurse Practitioner

## 2018-11-19 DIAGNOSIS — E039 Hypothyroidism, unspecified: Secondary | ICD-10-CM

## 2018-11-19 NOTE — Progress Notes (Signed)
Lab order only 

## 2018-11-20 LAB — THYROID PANEL WITH TSH
Free Thyroxine Index: 2.8 (ref 1.2–4.9)
T3 Uptake Ratio: 26 % (ref 24–39)
T4, Total: 10.6 ug/dL (ref 4.5–12.0)
TSH: 3.06 u[IU]/mL (ref 0.450–4.500)

## 2018-11-23 ENCOUNTER — Other Ambulatory Visit: Payer: Self-pay | Admitting: Oncology

## 2018-12-03 ENCOUNTER — Other Ambulatory Visit: Payer: Self-pay

## 2018-12-03 ENCOUNTER — Ambulatory Visit
Admission: RE | Admit: 2018-12-03 | Discharge: 2018-12-03 | Disposition: A | Discharge status: Home / Self Care | Payer: BLUE CROSS/BLUE SHIELD | Source: Ambulatory Visit | Attending: Oncology | Admitting: Oncology

## 2018-12-03 DIAGNOSIS — C3411 Malignant neoplasm of upper lobe, right bronchus or lung: Secondary | ICD-10-CM | POA: Diagnosis not present

## 2018-12-03 MED ORDER — TECHNETIUM TC 99M MEDRONATE IV KIT
21.6500 | PACK | Freq: Once | INTRAVENOUS | Status: AC | PRN
  Administered 2018-12-03: 09:00:00 21.65 via INTRAVENOUS

## 2018-12-04 ENCOUNTER — Ambulatory Visit
Admission: RE | Admit: 2018-12-04 | Discharge: 2018-12-04 | Disposition: A | Discharge status: Home / Self Care | Payer: BLUE CROSS/BLUE SHIELD | Source: Ambulatory Visit | Attending: Oncology | Admitting: Oncology

## 2018-12-04 ENCOUNTER — Other Ambulatory Visit: Payer: Self-pay

## 2018-12-04 DIAGNOSIS — C3411 Malignant neoplasm of upper lobe, right bronchus or lung: Secondary | ICD-10-CM | POA: Diagnosis not present

## 2018-12-04 MED ORDER — IOHEXOL 300 MG/ML  SOLN
75.0000 mL | Freq: Once | INTRAMUSCULAR | Status: AC | PRN
  Administered 2018-12-04: 75 mL via INTRAVENOUS

## 2018-12-06 NOTE — Progress Notes (Signed)
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    INTERVAL HISTORY: Patient returns to clinic today for further evaluation and discussion of her imaging results.  She reports her husband died this past week, but she otherwise feels well. She does not complain of pain today. She continues to have chronic weakness and fatigue.  She has no neurologic complaints.  She denies any recent fevers or illnesses. She denies any chest pain, shortness of breath, hemoptysis, or cough.  She has no nausea, vomiting, constipation, or diarrhea.  She has no melena or hematochezia.  She has no urinary complaints.  Patient offers no further specific complaints today.  REVIEW OF SYSTEMS:   Review of Systems  Constitutional: Positive for malaise/fatigue. Negative for fever and weight loss.  Respiratory: Negative.  Negative for cough and shortness of breath.   Cardiovascular: Negative.  Negative for chest pain and leg swelling.  Gastrointestinal: Negative.  Negative for abdominal pain, blood in stool and melena.  Genitourinary: Negative.  Negative for dysuria and flank pain.  Musculoskeletal: Negative for back pain and joint pain.  Skin: Negative.  Negative for rash.  Neurological: Positive for weakness. Negative for sensory change, focal weakness and headaches.  Psychiatric/Behavioral: Positive for depression. The patient is not nervous/anxious.     As per HPI. Otherwise, a complete review of systems is negative.  PAST MEDICAL HISTORY: Past Medical History:  Diagnosis Date   Arthritis    right shoulder   Cancer of right lung ([place]) 08/2017   Chemo + rad tx's.     Hyperlipidemia    Hypertension    Hypothyroidism    Menopausal state    Personal history of chemotherapy 2019   lung ca   Personal history of radiation therapy 2019   lung ca   Thyroid disease     PAST SURGICAL HISTORY: Past Surgical History:  Procedure Laterality Date   ABDOMINAL HYSTERECTOMY  2005   BREAST EXCISIONAL BIOPSY Right 1979   benign   COLONOSCOPY WITH PROPOFOL N/A 05/13/2017   Procedure: COLONOSCOPY WITH PROPOFOL;  Surgeon: [name], MD;  Location: [facility] ENDOSCOPY;  Service: Gastroenterology;  Laterality: N/A;   CYSTECTOMY Right    breast   CYSTECTOMY  02/2015   back of neck   ESOPHAGOGASTRODUODENOSCOPY (EGD) WITH PROPOFOL N/A 05/13/2017   Procedure: ESOPHAGOGASTRODUODENOSCOPY (EGD) WITH PROPOFOL;  Surgeon: [name], MD;  Location: [facility] ENDOSCOPY;  Service: Gastroenterology;  Laterality: N/A;   PORTA CATH INSERTION N/A 09/01/2017   Procedure: PORTA CATH INSERTION;  Surgeon: Algernon Huxley, MD;  Location: [facility] LAB;  Service: Cardiovascular;  Laterality: N/A;   TUBAL LIGATION  1986    FAMILY HISTORY: Family History  Problem Relation Age of Onset   Hypertension Mother    Stroke Mother    Leukemia Mother    Stroke Father    Pneumonia Father    Diabetes Sister    Hyperlipidemia Sister    Breast cancer Maternal Aunt 79    ADVANCED DIRECTIVES (Y/N):  N  HEALTH MAINTENANCE: Social History   Tobacco Use   Smoking status: Former Smoker    Packs/day: 0.25    Types: Cigarettes    Last attempt to quit: 02/01/2017  Years since quitting: 1.8   Smokeless tobacco: Never Used  Substance Use Topics   Alcohol use: No    Alcohol/week: 0.0 standard drinks   Drug use: No     Colonoscopy:  PAP:  Bone density:  Lipid panel:  No Known Allergies  Current Outpatient Medications  Medication Sig Dispense Refill   amLODipine (NORVASC) 5 MG tablet TAKE 1 TABLET BY MOUTH DAILY 30 tablet 0   atorvastatin (LIPITOR) 10 MG tablet  TAKE 1 TABLET DAILY AT 6 P.M. 10 tablet 0   DULoxetine (CYMBALTA) 30 MG capsule Take 1 capsule (30 mg total) by mouth daily. 30 capsule 3   folic acid (FOLVITE) 1 MG tablet Take 1 tablet (1 mg total) by mouth daily. 90 tablet 4   levothyroxine (SYNTHROID, LEVOTHROID) 50 MCG tablet TAKE 1 TABLET DAILY 90 tablet 3   lidocaine-prilocaine (EMLA) cream APP EXT AA 1 TIME  3   Oxycodone HCl 10 MG TABS Take 1 tablet (10 mg total) by mouth every 6 (six) hours as needed. 120 tablet 0   No current facility-administered medications for this visit.     OBJECTIVE: Vitals:   12/08/18 0851  BP: 127/77  Pulse: 88  Temp: 97.8 F (36.6 C)     Body mass index is 20.12 kg/m.    ECOG FS:0 - Asymptomatic  General: Well-developed, well-nourished, no acute distress. Eyes: Pink conjunctiva, anicteric sclera. HEENT: Normocephalic, moist mucous membranes. Lungs: Clear to auscultation bilaterally. Heart: Regular rate and rhythm. No rubs, murmurs, or gallops. Abdomen: Soft, nontender, nondistended. No organomegaly noted, normoactive bowel sounds. Musculoskeletal: No edema, cyanosis, or clubbing. Neuro: Alert, answering all questions appropriately. Cranial nerves grossly intact. Skin: No rashes or petechiae noted. Psych: Flat affect.  LAB RESULTS:  Lab Results  Component Value Date   NA 137 12/08/2018   K 2.4 (LL) 12/08/2018   CL 98 12/08/2018   CO2 25 12/08/2018   GLUCOSE 134 (H) 12/08/2018   BUN 9 12/08/2018   CREATININE 1.50 (H) 12/08/2018   CALCIUM 8.4 (L) 12/08/2018   PROT 7.3 12/08/2018   ALBUMIN 3.8 12/08/2018   AST 36 12/08/2018   ALT 16 12/08/2018   ALKPHOS 90 12/08/2018   BILITOT 0.4 12/08/2018   GFRNONAA 37 (L) 12/08/2018   GFRAA 43 (L) 12/08/2018    Lab Results  Component Value Date   WBC 3.7 (L) 12/08/2018   NEUTROABS 2.6 12/08/2018   HGB 9.0 (L) 12/08/2018   HCT 27.8 (L) 12/08/2018   MCV 100.7 (H) 12/08/2018   PLT 279 12/08/2018     STUDIES: Ct Chest W  Contrast  Result Date: 12/04/2018 CLINICAL DATA:  Right upper lobe lung cancer, status post chemotherapy and radiation EXAM: CT CHEST, ABDOMEN, AND PELVIS WITH CONTRAST TECHNIQUE: Multidetector CT imaging of the chest, abdomen and pelvis was performed following the standard protocol during bolus administration of intravenous contrast. CONTRAST:  15mL OMNIPAQUE IOHEXOL 300 MG/ML  SOLN COMPARISON:  CT chest abdomen pelvis dated 07/08/2018. PET-CT dated 03/20/2018. FINDINGS: CT CHEST FINDINGS Cardiovascular: Heart is normal in size.  No pericardial effusion. No evidence of thoracic aortic aneurysm. Atherosclerotic calcifications of the aortic arch. Right chest port terminates the cavoatrial junction. Mediastinum/Nodes: No suspicious mediastinal lymphadenopathy. 8 mm left thyroid nodule. Lungs/Pleura: Paramediastinal radiation changes, right upper lung predominant. Additional radiation changes in the superior segment right upper lobe (series 5/image 49). 8 x 11 mm thick-walled cavitary right lower lobe nodule (series 5/image 84), previously 7 x 9 mm. 8 mm left lower lobe nodule (  series 5/image 86), previously 6 mm. Additional scattered small bilateral pulmonary nodules, many of which are subpleural, mildly progressive. No focal consolidation. No pleural effusion or pneumothorax. Musculoskeletal: Degenerative changes of the thoracic spine. CT ABDOMEN PELVIS FINDINGS Hepatobiliary: Progressive hepatic metastases, including: --3.1 x 2.3 cm metastasis in segment 7 (series 4/image 40), previously 1.7 x 1.8 cm --2.5 x 2.7 cm metastasis in segment 4B (series 4/image 47), previously 1.3 cm Gallbladder is unremarkable. No intrahepatic or extrahepatic ductal dilatation. Pancreas: Within normal limits. Spleen: Within normal limits. Adrenals/Urinary Tract: 1.5 x 0.7 cm left adrenal metastasis (series 4/image 48), previously 12 mm. Right adrenal glands are within normal limits. Kidneys are within normal limits.  No hydronephrosis.  Bladder is within normal limits. Stomach/Bowel: Stomach is within normal limits. No evidence of bowel obstruction. Normal appendix (series 4/image 80). Vascular/Lymphatic: No evidence of abdominal aortic aneurysm. Atherosclerotic calcifications of the abdominal aorta and branch vessels. No suspicious abdominopelvic lymphadenopathy. Reproductive: Status post hysterectomy. No adnexal masses. Other: No abdominopelvic ascites. Musculoskeletal: Visualized osseous structures are within normal limits. IMPRESSION: Progressive pulmonary metastases, including a dominant 11 mm right lower lobe nodule. Progressive hepatic metastases, with index lesions as above. Mildly progressive left adrenal metastasis. Known osseous metastases are poorly evaluated on CT. Electronically Signed   By: Julian Hy M.D.   On: 12/04/2018 14:09   Nm Bone Scan Whole Body  Result Date: 12/03/2018 CLINICAL DATA:  Cancer of upper lobe of right lung. EXAM: NUCLEAR MEDICINE WHOLE BODY BONE SCAN TECHNIQUE: Whole body anterior and posterior images were obtained approximately 3 hours after intravenous injection of radiopharmaceutical. RADIOPHARMACEUTICALS:  21.65 mCi Technetium-41m MDP IV COMPARISON:  Bone scan and CT scans of July 08, 2018. PET scan of March 20, 2018. FINDINGS: Stable focus of abnormal uptake is seen involving the left acromion consistent with metastatic disease. Stable abnormal uptake is seen involving left iliac bone anteriorly consistent with metastatic disease. Stable focus of abnormal uptake is seen involving the posterior portion of the left iliac bone also consistent with metastatic disease. Abnormal uptake is seen involving the anterior portion of right lower rib which is slightly improved compared to prior exam. Abnormal uptake is noted in both feet consistent with degenerative change. Mild focus of abnormal uptake is seen involving the lower sternum concerning for metastatic disease. Abnormal uptake is also seen  involving the left inferior pubic ramus which is unchanged compared to prior exam. No new areas of abnormal uptake are noted. IMPRESSION: Continued presence of abnormal foci of abnormal uptake is seen involving the left inferior pubic ramus, left acromion, left iliac bone, lower sternum and right lower rib consistent with metastatic disease. No new areas of abnormal uptake are noted. Electronically Signed   By: Marijo Conception M.D.   On: 12/03/2018 14:58   Ct Abdomen Pelvis W Contrast  Result Date: 12/04/2018 CLINICAL DATA:  Right upper lobe lung cancer, status post chemotherapy and radiation EXAM: CT CHEST, ABDOMEN, AND PELVIS WITH CONTRAST TECHNIQUE: Multidetector CT imaging of the chest, abdomen and pelvis was performed following the standard protocol during bolus administration of intravenous contrast. CONTRAST:  73mL OMNIPAQUE IOHEXOL 300 MG/ML  SOLN COMPARISON:  CT chest abdomen pelvis dated 07/08/2018. PET-CT dated 03/20/2018. FINDINGS: CT CHEST FINDINGS Cardiovascular: Heart is normal in size.  No pericardial effusion. No evidence of thoracic aortic aneurysm. Atherosclerotic calcifications of the aortic arch. Right chest port terminates the cavoatrial junction. Mediastinum/Nodes: No suspicious mediastinal lymphadenopathy. 8 mm left thyroid nodule. Lungs/Pleura: Paramediastinal radiation changes, right upper  lung predominant. Additional radiation changes in the superior segment right upper lobe (series 5/image 49). 8 x 11 mm thick-walled cavitary right lower lobe nodule (series 5/image 84), previously 7 x 9 mm. 8 mm left lower lobe nodule (series 5/image 86), previously 6 mm. Additional scattered small bilateral pulmonary nodules, many of which are subpleural, mildly progressive. No focal consolidation. No pleural effusion or pneumothorax. Musculoskeletal: Degenerative changes of the thoracic spine. CT ABDOMEN PELVIS FINDINGS Hepatobiliary: Progressive hepatic metastases, including: --3.1 x 2.3 cm  metastasis in segment 7 (series 4/image 40), previously 1.7 x 1.8 cm --2.5 x 2.7 cm metastasis in segment 4B (series 4/image 47), previously 1.3 cm Gallbladder is unremarkable. No intrahepatic or extrahepatic ductal dilatation. Pancreas: Within normal limits. Spleen: Within normal limits. Adrenals/Urinary Tract: 1.5 x 0.7 cm left adrenal metastasis (series 4/image 48), previously 12 mm. Right adrenal glands are within normal limits. Kidneys are within normal limits.  No hydronephrosis. Bladder is within normal limits. Stomach/Bowel: Stomach is within normal limits. No evidence of bowel obstruction. Normal appendix (series 4/image 80). Vascular/Lymphatic: No evidence of abdominal aortic aneurysm. Atherosclerotic calcifications of the abdominal aorta and branch vessels. No suspicious abdominopelvic lymphadenopathy. Reproductive: Status post hysterectomy. No adnexal masses. Other: No abdominopelvic ascites. Musculoskeletal: Visualized osseous structures are within normal limits. IMPRESSION: Progressive pulmonary metastases, including a dominant 11 mm right lower lobe nodule. Progressive hepatic metastases, with index lesions as above. Mildly progressive left adrenal metastasis. Known osseous metastases are poorly evaluated on CT. Electronically Signed   By: Julian Hy M.D.   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     PLAN:    1. 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  CT scan results from Dec 04, 2018 reviewed independently and report as above with mild progression of pulmonary and adrenal metastasis, and significant progression of liver metastasis.  Patient wishes to continue with palliative treatment, therefore will return to clinic in 1 week for consideration of third line chemotherapy using  Taxotere and Cyramza.  Patient will receive treatment every 3 weeks until intolerable side effects or progression of disease.  Return to clinic in 1 week for consideration of cycle 1. 2.  Anemia: Hemoglobin decreased, but stable at 9.0.   3.  Pain: Patient does not complain of pain today.  Continue current narcotic regimen as prescribed. 4.  Palliative care: Appreciate input. 5.  Left shoulder mobility: Appreciate rehab input. 6.  Hypokalemia: Potassium significantly decreased at 2.4 today.  Patient was given 40 mEq of IV potassium and instructed to continue her oral potassium supplementation. 7.  Chronic renal insufficiency: Patient will also receive an additional liter of IV fluids today.  Patient expressed understanding and was in agreement with this plan. She also understands that She can call clinic at any time with any questions, concerns, or complaints.   Cancer Staging Cancer of upper lobe of right lung [facility]) Staging form: Lung, AJCC 8th Edition - Clinical stage from 08/24/2017: Stage IV (cT2a, cN3, cM1c) - Signed by Lloyd Huger, MD on 03/29/2018   Lloyd Huger, MD   12/08/2018 1:11 PM

## 2018-12-08 ENCOUNTER — Inpatient Hospital Stay: Payer: BC Managed Care – PPO | Attending: Oncology

## 2018-12-08 ENCOUNTER — Inpatient Hospital Stay: Payer: BC Managed Care – PPO

## 2018-12-08 ENCOUNTER — Encounter: Payer: Self-pay | Admitting: Oncology

## 2018-12-08 ENCOUNTER — Other Ambulatory Visit: Payer: Self-pay

## 2018-12-08 ENCOUNTER — Inpatient Hospital Stay (HOSPITAL_BASED_OUTPATIENT_CLINIC_OR_DEPARTMENT_OTHER): Payer: BC Managed Care – PPO | Admitting: Oncology

## 2018-12-08 VITALS — BP 127/77 | HR 88 | Temp 97.8°F | Ht 60.0 in | Wt 103.0 lb

## 2018-12-08 DIAGNOSIS — Z5112 Encounter for antineoplastic immunotherapy: Secondary | ICD-10-CM | POA: Diagnosis not present

## 2018-12-08 DIAGNOSIS — D649 Anemia, unspecified: Secondary | ICD-10-CM | POA: Diagnosis not present

## 2018-12-08 DIAGNOSIS — I129 Hypertensive chronic kidney disease with stage 1 through stage 4 chronic kidney disease, or unspecified chronic kidney disease: Secondary | ICD-10-CM | POA: Insufficient documentation

## 2018-12-08 DIAGNOSIS — Z803 Family history of malignant neoplasm of breast: Secondary | ICD-10-CM | POA: Diagnosis not present

## 2018-12-08 DIAGNOSIS — Z79899 Other long term (current) drug therapy: Secondary | ICD-10-CM | POA: Diagnosis not present

## 2018-12-08 DIAGNOSIS — E785 Hyperlipidemia, unspecified: Secondary | ICD-10-CM | POA: Insufficient documentation

## 2018-12-08 DIAGNOSIS — Z7689 Persons encountering health services in other specified circumstances: Secondary | ICD-10-CM | POA: Diagnosis not present

## 2018-12-08 DIAGNOSIS — C787 Secondary malignant neoplasm of liver and intrahepatic bile duct: Secondary | ICD-10-CM | POA: Insufficient documentation

## 2018-12-08 DIAGNOSIS — N189 Chronic kidney disease, unspecified: Secondary | ICD-10-CM | POA: Diagnosis not present

## 2018-12-08 DIAGNOSIS — E876 Hypokalemia: Secondary | ICD-10-CM | POA: Insufficient documentation

## 2018-12-08 DIAGNOSIS — R531 Weakness: Secondary | ICD-10-CM

## 2018-12-08 DIAGNOSIS — Z87891 Personal history of nicotine dependence: Secondary | ICD-10-CM

## 2018-12-08 DIAGNOSIS — E039 Hypothyroidism, unspecified: Secondary | ICD-10-CM | POA: Diagnosis not present

## 2018-12-08 DIAGNOSIS — Z5111 Encounter for antineoplastic chemotherapy: Secondary | ICD-10-CM | POA: Diagnosis not present

## 2018-12-08 DIAGNOSIS — R5381 Other malaise: Secondary | ICD-10-CM

## 2018-12-08 DIAGNOSIS — C3411 Malignant neoplasm of upper lobe, right bronchus or lung: Secondary | ICD-10-CM | POA: Insufficient documentation

## 2018-12-08 DIAGNOSIS — Z923 Personal history of irradiation: Secondary | ICD-10-CM | POA: Insufficient documentation

## 2018-12-08 DIAGNOSIS — M1990 Unspecified osteoarthritis, unspecified site: Secondary | ICD-10-CM | POA: Diagnosis not present

## 2018-12-08 DIAGNOSIS — R5382 Chronic fatigue, unspecified: Secondary | ICD-10-CM | POA: Insufficient documentation

## 2018-12-08 DIAGNOSIS — C7951 Secondary malignant neoplasm of bone: Secondary | ICD-10-CM | POA: Diagnosis not present

## 2018-12-08 DIAGNOSIS — R1310 Dysphagia, unspecified: Secondary | ICD-10-CM | POA: Insufficient documentation

## 2018-12-08 DIAGNOSIS — Z95828 Presence of other vascular implants and grafts: Secondary | ICD-10-CM

## 2018-12-08 DIAGNOSIS — C3490 Malignant neoplasm of unspecified part of unspecified bronchus or lung: Secondary | ICD-10-CM

## 2018-12-08 LAB — CBC WITH DIFFERENTIAL/PLATELET
Abs Immature Granulocytes: 0.03 10*3/uL (ref 0.00–0.07)
Basophils Absolute: 0 10*3/uL (ref 0.0–0.1)
Basophils Relative: 1 %
Eosinophils Absolute: 0 10*3/uL (ref 0.0–0.5)
Eosinophils Relative: 0 %
HCT: 27.8 % — ABNORMAL LOW (ref 36.0–46.0)
Hemoglobin: 9 g/dL — ABNORMAL LOW (ref 12.0–15.0)
Immature Granulocytes: 1 %
Lymphocytes Relative: 14 %
Lymphs Abs: 0.5 10*3/uL — ABNORMAL LOW (ref 0.7–4.0)
MCH: 32.6 pg (ref 26.0–34.0)
MCHC: 32.4 g/dL (ref 30.0–36.0)
MCV: 100.7 fL — ABNORMAL HIGH (ref 80.0–100.0)
Monocytes Absolute: 0.5 10*3/uL (ref 0.1–1.0)
Monocytes Relative: 13 %
Neutro Abs: 2.6 10*3/uL (ref 1.7–7.7)
Neutrophils Relative %: 71 %
Platelets: 279 10*3/uL (ref 150–400)
RBC: 2.76 MIL/uL — ABNORMAL LOW (ref 3.87–5.11)
RDW: 13.7 % (ref 11.5–15.5)
WBC: 3.7 10*3/uL — ABNORMAL LOW (ref 4.0–10.5)
nRBC: 0 % (ref 0.0–0.2)

## 2018-12-08 LAB — COMPREHENSIVE METABOLIC PANEL
ALT: 16 U/L (ref 0–44)
AST: 36 U/L (ref 15–41)
Albumin: 3.8 g/dL (ref 3.5–5.0)
Alkaline Phosphatase: 90 U/L (ref 38–126)
Anion gap: 14 (ref 5–15)
BUN: 9 mg/dL (ref 8–23)
CO2: 25 mmol/L (ref 22–32)
Calcium: 8.4 mg/dL — ABNORMAL LOW (ref 8.9–10.3)
Chloride: 98 mmol/L (ref 98–111)
Creatinine, Ser: 1.5 mg/dL — ABNORMAL HIGH (ref 0.44–1.00)
GFR calc Af Amer: 43 mL/min — ABNORMAL LOW (ref >60)
GFR calc non Af Amer: 37 mL/min — ABNORMAL LOW (ref >60)
Glucose, Bld: 134 mg/dL — ABNORMAL HIGH (ref 70–99)
Potassium: 2.4 mmol/L — CL (ref 3.5–5.1)
Sodium: 137 mmol/L (ref 135–145)
Total Bilirubin: 0.4 mg/dL (ref 0.3–1.2)
Total Protein: 7.3 g/dL (ref 6.5–8.1)

## 2018-12-08 LAB — SAMPLE TO BLOOD BANK

## 2018-12-08 LAB — PROTEIN, URINE, RANDOM: Total Protein, Urine: 62 mg/dL

## 2018-12-08 MED ORDER — SODIUM CHLORIDE 0.9 % IV SOLN
40.0000 meq | Freq: Once | INTRAVENOUS | Status: AC
  Administered 2018-12-08: 40 meq via INTRAVENOUS
  Filled 2018-12-08: qty 20

## 2018-12-08 MED ORDER — SODIUM CHLORIDE 0.9 % IV SOLN
Freq: Once | INTRAVENOUS | Status: AC
  Administered 2018-12-08: 10:00:00 1000 mL via INTRAVENOUS
  Filled 2018-12-08: qty 250

## 2018-12-08 MED ORDER — HEPARIN SOD (PORK) LOCK FLUSH 100 UNIT/ML IV SOLN
500.0000 [IU] | Freq: Once | INTRAVENOUS | Status: AC
  Administered 2018-12-08: 500 [IU] via INTRAVENOUS
  Filled 2018-12-08: qty 5

## 2018-12-08 MED ORDER — SODIUM CHLORIDE 0.9% FLUSH
10.0000 mL | Freq: Once | INTRAVENOUS | Status: AC
  Administered 2018-12-08: 10 mL via INTRAVENOUS
  Filled 2018-12-08: qty 10

## 2018-12-08 NOTE — Progress Notes (Signed)
DISCONTINUE ON PATHWAY REGIMEN - Non-Small Cell Lung     A cycle is every 21 days:     Carboplatin      Pemetrexed      Bevacizumab-xxxx   **Always confirm dose/schedule in your pharmacy ordering system**  REASON: Disease Progression PRIOR TREATMENT: [id number]: Carboplatin AUC=5 + Pemetrexed 500 mg/m2 + Bevacizumab 15 mg/kg q21 Days x 4-6 Cycles TREATMENT RESPONSE: Progressive Disease (PD)  START ON PATHWAY REGIMEN - Non-Small Cell Lung     A cycle is every 21 days:     Ramucirumab      Docetaxel   **Always confirm dose/schedule in your pharmacy ordering system**  Patient Characteristics: Stage IV Metastatic, Nonsquamous, Second Line - Chemotherapy/Immunotherapy, PS = 0, 1, No Prior PD-1/PD-L1  Inhibitor or Prior PD-1/PD-L1 Inhibitor + Chemotherapy, and Not a Candidate for Immunotherapy AJCC T Category: T2a Current Disease Status: Distant Metastases AJCC N Category: N3 AJCC M Category: M1c AJCC 8 Stage Grouping: IVB Histology: Nonsquamous Cell ROS1 Rearrangement Status: Negative T790M Mutation Status: Not Applicable - EGFR Mutation Negative/Unknown Other Mutations/Biomarkers: No Other Actionable Mutations NTRK Gene Fusion Status: Negative PD-L1 Expression Status: PD-L1 Negative Chemotherapy/Immunotherapy LOT: Second Line Chemotherapy/Immunotherapy Molecular Targeted Therapy: Not Appropriate ALK Rearrangement Status: Negative EGFR Mutation Status: Quantity Not Sufficient BRAF V600E Mutation Status: Negative ECOG Performance Status: 1 Immunotherapy Candidate Status: Not a Candidate for Immunotherapy Prior Immunotherapy Status: Prior PD-1/PD-L1 Inhibitor + Chemotherapy Intent of Therapy: Non-Curative / Palliative Intent, Discussed with Patient

## 2018-12-08 NOTE — Progress Notes (Signed)
Patient stated that she had been doing well. Patient stated that she tries to eat but her appetite is not so good. Patient had lost 8 lbs since 11/17/2018. Patient denies fever, chills, nausea, vomiting, constipation and/or diarrhea.

## 2018-12-12 NOTE — Progress Notes (Signed)
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    INTERVAL HISTORY: Patient returns to clinic today for further evaluation and initiation of cycle 1 of Taxotere and Cyramza.  She currently feels well and is asymptomatic. She does not complain of pain today. She continues to have chronic weakness and fatigue.  She has no neurologic complaints.  She denies any recent fevers or illnesses. She denies any chest pain, shortness of breath, hemoptysis, or cough.  She has no nausea, vomiting, constipation, or diarrhea.  She has no melena or hematochezia.  She has no urinary complaints.  Patient offers no further specific complaints today.  REVIEW OF SYSTEMS:   Review of Systems  Constitutional: Positive for malaise/fatigue. Negative for fever and weight loss.  Respiratory: Negative.  Negative for cough and shortness of breath.   Cardiovascular: Negative.  Negative for chest pain and leg swelling.  Gastrointestinal: Negative.  Negative for abdominal pain, blood in stool and melena.  Genitourinary: Negative.  Negative for dysuria and flank pain.  Musculoskeletal: Negative for back pain and joint pain.  Skin: Negative.  Negative for rash.  Neurological: Positive for weakness. Negative for sensory change, focal weakness and headaches.  Psychiatric/Behavioral: Negative.  Negative for depression. The patient is not nervous/anxious.     As per HPI. Otherwise, a complete review of systems is negative.  PAST MEDICAL HISTORY: Past Medical History:  Diagnosis Date   Arthritis    right shoulder   Cancer of right lung ([place]) 08/2017   Chemo + rad tx's.    Hyperlipidemia      Hypertension    Hypothyroidism    Menopausal state    Personal history of chemotherapy 2019   lung ca   Personal history of radiation therapy 2019   lung ca   Thyroid disease     PAST SURGICAL HISTORY: Past Surgical History:  Procedure Laterality Date   ABDOMINAL HYSTERECTOMY  2005   BREAST EXCISIONAL BIOPSY Right 1979   benign   COLONOSCOPY WITH PROPOFOL N/A 05/13/2017   Procedure: COLONOSCOPY WITH PROPOFOL;  Surgeon: [name], MD;  Location: [facility] ENDOSCOPY;  Service: Gastroenterology;  Laterality: N/A;   CYSTECTOMY Right    breast   CYSTECTOMY  02/2015   back of neck   ESOPHAGOGASTRODUODENOSCOPY (EGD) WITH PROPOFOL N/A 05/13/2017   Procedure: ESOPHAGOGASTRODUODENOSCOPY (EGD) WITH PROPOFOL;  Surgeon: [name], MD;  Location: [facility] ENDOSCOPY;  Service: Gastroenterology;  Laterality: N/A;   PORTA CATH INSERTION N/A 09/01/2017   Procedure: PORTA CATH INSERTION;  Surgeon: Algernon Huxley, MD;  Location: [facility] LAB;  Service: Cardiovascular;  Laterality: N/A;   TUBAL LIGATION  1986    FAMILY HISTORY: Family History  Problem Relation Age of Onset   Hypertension Mother    Stroke Mother    Leukemia Mother    Stroke Father    Pneumonia Father    Diabetes Sister    Hyperlipidemia Sister    Breast cancer Maternal Aunt 69    ADVANCED DIRECTIVES (Y/N):  N  HEALTH MAINTENANCE: Social History   Tobacco Use   Smoking status: Former Smoker    Packs/day: 0.25    Types: Cigarettes    Last attempt to quit: 02/01/2017  Years since quitting: 1.8   Smokeless tobacco: Never Used  Substance Use Topics   Alcohol use: No    Alcohol/week: 0.0 standard drinks   Drug use: No     Colonoscopy:  PAP:  Bone density:  Lipid panel:  No Known Allergies  Current Outpatient Medications  Medication Sig Dispense Refill   amLODipine (NORVASC) 5 MG tablet TAKE 1 TABLET BY MOUTH DAILY 30 tablet 0   atorvastatin (LIPITOR) 10 MG tablet TAKE 1 TABLET  DAILY AT 6 P.M. 10 tablet 0   DULoxetine (CYMBALTA) 30 MG capsule Take 1 capsule (30 mg total) by mouth daily. 30 capsule 3   folic acid (FOLVITE) 1 MG tablet Take 1 tablet (1 mg total) by mouth daily. 90 tablet 4   levothyroxine (SYNTHROID, LEVOTHROID) 50 MCG tablet TAKE 1 TABLET DAILY 90 tablet 3   lidocaine-prilocaine (EMLA) cream APP EXT AA 1 TIME  3   Oxycodone HCl 10 MG TABS Take 1 tablet (10 mg total) by mouth every 6 (six) hours as needed. 120 tablet 0   No current facility-administered medications for this visit.    Facility-Administered Medications Ordered in Other Visits  Medication Dose Route Frequency Provider Last Rate Last Dose   heparin lock flush 100 unit/mL  500 Units Intravenous Once Grayland Ormond, Kathlene November, MD       heparin lock flush 100 unit/mL  500 Units Intracatheter Once PRN Lloyd Huger, MD       pegfilgrastim (NEULASTA ONPRO KIT) injection 6 mg  6 mg Subcutaneous Once Lloyd Huger, MD       potassium chloride 40 mEq in sodium chloride 0.9 % 270 mL (0.1481 mEq/mL) infusion  40 mEq Intravenous Once Lloyd Huger, MD 135 mL/hr at 12/15/18 1256 40 mEq at 12/15/18 1256    OBJECTIVE: Vitals:   12/15/18 0920  BP: 136/82  Pulse: 77  Resp: 18     Body mass index is 20.17 kg/m.    ECOG FS:0 - Asymptomatic  General: Well-developed, well-nourished, no acute distress. Eyes: Pink conjunctiva, anicteric sclera. HEENT: Normocephalic, moist mucous membranes. Lungs: Clear to auscultation bilaterally. Heart: Regular rate and rhythm. No rubs, murmurs, or gallops. Abdomen: Soft, nontender, nondistended. No organomegaly noted, normoactive bowel sounds. Musculoskeletal: No edema, cyanosis, or clubbing. Neuro: Alert, answering all questions appropriately. Cranial nerves grossly intact. Skin: No rashes or petechiae noted. Psych: Normal affect.  LAB RESULTS:  Lab Results  Component Value Date   NA 140 12/15/2018   K 2.6 (LL) 12/15/2018   CL 100  12/15/2018   CO2 30 12/15/2018   GLUCOSE 110 (H) 12/15/2018   BUN 7 (L) 12/15/2018   CREATININE 1.20 (H) 12/15/2018   CALCIUM 8.5 (L) 12/15/2018   PROT 7.8 12/15/2018   ALBUMIN 3.9 12/15/2018   AST 28 12/15/2018   ALT 11 12/15/2018   ALKPHOS 87 12/15/2018   BILITOT 0.3 12/15/2018   GFRNONAA 48 (L) 12/15/2018   GFRAA 56 (L) 12/15/2018    Lab Results  Component Value Date   WBC 4.3 12/15/2018   NEUTROABS 3.2 12/15/2018   HGB 9.5 (L) 12/15/2018   HCT 29.4 (L) 12/15/2018   MCV 101.4 (H) 12/15/2018   PLT 233 12/15/2018     STUDIES: Ct Chest W Contrast  Result Date: 12/04/2018 CLINICAL DATA:  Right upper lobe lung cancer, status post chemotherapy and radiation EXAM: CT CHEST, ABDOMEN, AND PELVIS WITH CONTRAST TECHNIQUE: Multidetector CT imaging of the chest, abdomen and pelvis was performed following the standard protocol during  bolus administration of intravenous contrast. CONTRAST:  63m OMNIPAQUE IOHEXOL 300 MG/ML  SOLN COMPARISON:  CT chest abdomen pelvis dated 07/08/2018. PET-CT dated 03/20/2018. FINDINGS: CT CHEST FINDINGS Cardiovascular: Heart is normal in size.  No pericardial effusion. No evidence of thoracic aortic aneurysm. Atherosclerotic calcifications of the aortic arch. Right chest port terminates the cavoatrial junction. Mediastinum/Nodes: No suspicious mediastinal lymphadenopathy. 8 mm left thyroid nodule. Lungs/Pleura: Paramediastinal radiation changes, right upper lung predominant. Additional radiation changes in the superior segment right upper lobe (series 5/image 49). 8 x 11 mm thick-walled cavitary right lower lobe nodule (series 5/image 84), previously 7 x 9 mm. 8 mm left lower lobe nodule (series 5/image 86), previously 6 mm. Additional scattered small bilateral pulmonary nodules, many of which are subpleural, mildly progressive. No focal consolidation. No pleural effusion or pneumothorax. Musculoskeletal: Degenerative changes of the thoracic spine. CT ABDOMEN PELVIS  FINDINGS Hepatobiliary: Progressive hepatic metastases, including: --3.1 x 2.3 cm metastasis in segment 7 (series 4/image 40), previously 1.7 x 1.8 cm --2.5 x 2.7 cm metastasis in segment 4B (series 4/image 47), previously 1.3 cm Gallbladder is unremarkable. No intrahepatic or extrahepatic ductal dilatation. Pancreas: Within normal limits. Spleen: Within normal limits. Adrenals/Urinary Tract: 1.5 x 0.7 cm left adrenal metastasis (series 4/image 48), previously 12 mm. Right adrenal glands are within normal limits. Kidneys are within normal limits.  No hydronephrosis. Bladder is within normal limits. Stomach/Bowel: Stomach is within normal limits. No evidence of bowel obstruction. Normal appendix (series 4/image 80). Vascular/Lymphatic: No evidence of abdominal aortic aneurysm. Atherosclerotic calcifications of the abdominal aorta and branch vessels. No suspicious abdominopelvic lymphadenopathy. Reproductive: Status post hysterectomy. No adnexal masses. Other: No abdominopelvic ascites. Musculoskeletal: Visualized osseous structures are within normal limits. IMPRESSION: Progressive pulmonary metastases, including a dominant 11 mm right lower lobe nodule. Progressive hepatic metastases, with index lesions as above. Mildly progressive left adrenal metastasis. Known osseous metastases are poorly evaluated on CT. Electronically Signed   By: SJulian HyM.D.   On: 12/04/2018 14:09   Nm Bone Scan Whole Body  Result Date: 12/03/2018 CLINICAL DATA:  Cancer of upper lobe of right lung. EXAM: NUCLEAR MEDICINE WHOLE BODY BONE SCAN TECHNIQUE: Whole body anterior and posterior images were obtained approximately 3 hours after intravenous injection of radiopharmaceutical. RADIOPHARMACEUTICALS:  21.65 mCi Technetium-945mDP IV COMPARISON:  Bone scan and CT scans of July 08, 2018. PET scan of March 20, 2018. FINDINGS: Stable focus of abnormal uptake is seen involving the left acromion consistent with metastatic disease.  Stable abnormal uptake is seen involving left iliac bone anteriorly consistent with metastatic disease. Stable focus of abnormal uptake is seen involving the posterior portion of the left iliac bone also consistent with metastatic disease. Abnormal uptake is seen involving the anterior portion of right lower rib which is slightly improved compared to prior exam. Abnormal uptake is noted in both feet consistent with degenerative change. Mild focus of abnormal uptake is seen involving the lower sternum concerning for metastatic disease. Abnormal uptake is also seen involving the left inferior pubic ramus which is unchanged compared to prior exam. No new areas of abnormal uptake are noted. IMPRESSION: Continued presence of abnormal foci of abnormal uptake is seen involving the left inferior pubic ramus, left acromion, left iliac bone, lower sternum and right lower rib consistent with metastatic disease. No new areas of abnormal uptake are noted. Electronically Signed   By: JaMarijo Conception.D.   On: 12/03/2018 14:58   Ct Abdomen Pelvis W Contrast  Result Date:  12/04/2018 CLINICAL DATA:  Right upper lobe lung cancer, status post chemotherapy and radiation EXAM: CT CHEST, ABDOMEN, AND PELVIS WITH CONTRAST TECHNIQUE: Multidetector CT imaging of the chest, abdomen and pelvis was performed following the standard protocol during bolus administration of intravenous contrast. CONTRAST:  42m OMNIPAQUE IOHEXOL 300 MG/ML  SOLN COMPARISON:  CT chest abdomen pelvis dated 07/08/2018. PET-CT dated 03/20/2018. FINDINGS: CT CHEST FINDINGS Cardiovascular: Heart is normal in size.  No pericardial effusion. No evidence of thoracic aortic aneurysm. Atherosclerotic calcifications of the aortic arch. Right chest port terminates the cavoatrial junction. Mediastinum/Nodes: No suspicious mediastinal lymphadenopathy. 8 mm left thyroid nodule. Lungs/Pleura: Paramediastinal radiation changes, right upper lung predominant. Additional radiation  changes in the superior segment right upper lobe (series 5/image 49). 8 x 11 mm thick-walled cavitary right lower lobe nodule (series 5/image 84), previously 7 x 9 mm. 8 mm left lower lobe nodule (series 5/image 86), previously 6 mm. Additional scattered small bilateral pulmonary nodules, many of which are subpleural, mildly progressive. No focal consolidation. No pleural effusion or pneumothorax. Musculoskeletal: Degenerative changes of the thoracic spine. CT ABDOMEN PELVIS FINDINGS Hepatobiliary: Progressive hepatic metastases, including: --3.1 x 2.3 cm metastasis in segment 7 (series 4/image 40), previously 1.7 x 1.8 cm --2.5 x 2.7 cm metastasis in segment 4B (series 4/image 47), previously 1.3 cm Gallbladder is unremarkable. No intrahepatic or extrahepatic ductal dilatation. Pancreas: Within normal limits. Spleen: Within normal limits. Adrenals/Urinary Tract: 1.5 x 0.7 cm left adrenal metastasis (series 4/image 48), previously 12 mm. Right adrenal glands are within normal limits. Kidneys are within normal limits.  No hydronephrosis. Bladder is within normal limits. Stomach/Bowel: Stomach is within normal limits. No evidence of bowel obstruction. Normal appendix (series 4/image 80). Vascular/Lymphatic: No evidence of abdominal aortic aneurysm. Atherosclerotic calcifications of the abdominal aorta and branch vessels. No suspicious abdominopelvic lymphadenopathy. Reproductive: Status post hysterectomy. No adnexal masses. Other: No abdominopelvic ascites. Musculoskeletal: Visualized osseous structures are within normal limits. IMPRESSION: Progressive pulmonary metastases, including a dominant 11 mm right lower lobe nodule. Progressive hepatic metastases, with index lesions as above. Mildly progressive left adrenal metastasis. Known osseous metastases are poorly evaluated on CT. Electronically Signed   By: SJulian HyM.D.   On: 12/04/2018 14:09    ASSESSMENT: Progressive stage IV adenocarcinoma of the lung  with metastatic disease in bones and liver.     PLAN:    1. 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  CT scan results from Dec 04, 2018 reviewed independently and reported as above with mild progression of pulmonary and adrenal metastasis, and significant progression of liver metastasis.  Patient wishes to continue with palliative treatment.  Proceed with cycle 1 of Taxotere and Cyramza today along with On-Pro Neulasta.  Return to clinic in 1 week for laboratory work only and then in 3 weeks for further evaluation and consideration of cycle 2.  2.  Anemia: Hemoglobin mildly improved to 9.5. 3.  Pain: Patient does not complain of pain today.  Continue current narcotic regimen as prescribed. 4.  Palliative care: Appreciate input. 5.  Left shoulder mobility: Appreciate rehab input. 6.  Hypokalemia: Potassium only mildly improved to 2.6 today.  Proceed with 40 mEq IV potassium along with her treatment.  Continue oral potassium supplementation. 7.  Chronic renal insufficiency: Improved.  Creatinine 1.2 today  Patient expressed understanding and was in agreement with this plan. She also understands that She can call  clinic at any time with any questions, concerns, or complaints.   Cancer Staging Cancer of upper lobe of right lung [facility]) Staging form: Lung, AJCC 8th Edition - Clinical stage from 08/24/2017: Stage IV (cT2a, cN3, cM1c) - Signed by Lloyd Huger, MD on 03/29/2018   Lloyd Huger, MD   12/15/2018 2:11 PM

## 2018-12-15 ENCOUNTER — Inpatient Hospital Stay: Payer: BC Managed Care – PPO

## 2018-12-15 ENCOUNTER — Other Ambulatory Visit: Payer: Self-pay

## 2018-12-15 ENCOUNTER — Inpatient Hospital Stay (HOSPITAL_BASED_OUTPATIENT_CLINIC_OR_DEPARTMENT_OTHER): Payer: BC Managed Care – PPO | Admitting: Oncology

## 2018-12-15 VITALS — BP 109/70 | HR 80 | Resp 17

## 2018-12-15 VITALS — BP 136/82 | HR 77 | Resp 18 | Wt 103.3 lb

## 2018-12-15 DIAGNOSIS — I129 Hypertensive chronic kidney disease with stage 1 through stage 4 chronic kidney disease, or unspecified chronic kidney disease: Secondary | ICD-10-CM | POA: Diagnosis not present

## 2018-12-15 DIAGNOSIS — E876 Hypokalemia: Secondary | ICD-10-CM

## 2018-12-15 DIAGNOSIS — Z5112 Encounter for antineoplastic immunotherapy: Secondary | ICD-10-CM | POA: Diagnosis not present

## 2018-12-15 DIAGNOSIS — R5382 Chronic fatigue, unspecified: Secondary | ICD-10-CM | POA: Diagnosis not present

## 2018-12-15 DIAGNOSIS — Z87891 Personal history of nicotine dependence: Secondary | ICD-10-CM

## 2018-12-15 DIAGNOSIS — C3411 Malignant neoplasm of upper lobe, right bronchus or lung: Secondary | ICD-10-CM

## 2018-12-15 DIAGNOSIS — Z923 Personal history of irradiation: Secondary | ICD-10-CM

## 2018-12-15 DIAGNOSIS — C787 Secondary malignant neoplasm of liver and intrahepatic bile duct: Secondary | ICD-10-CM

## 2018-12-15 DIAGNOSIS — N189 Chronic kidney disease, unspecified: Secondary | ICD-10-CM | POA: Diagnosis not present

## 2018-12-15 DIAGNOSIS — E039 Hypothyroidism, unspecified: Secondary | ICD-10-CM

## 2018-12-15 DIAGNOSIS — R5381 Other malaise: Secondary | ICD-10-CM

## 2018-12-15 DIAGNOSIS — R1310 Dysphagia, unspecified: Secondary | ICD-10-CM | POA: Diagnosis not present

## 2018-12-15 DIAGNOSIS — E785 Hyperlipidemia, unspecified: Secondary | ICD-10-CM

## 2018-12-15 DIAGNOSIS — Z5111 Encounter for antineoplastic chemotherapy: Secondary | ICD-10-CM | POA: Diagnosis not present

## 2018-12-15 DIAGNOSIS — D649 Anemia, unspecified: Secondary | ICD-10-CM | POA: Diagnosis not present

## 2018-12-15 DIAGNOSIS — Z7689 Persons encountering health services in other specified circumstances: Secondary | ICD-10-CM | POA: Diagnosis not present

## 2018-12-15 DIAGNOSIS — Z79899 Other long term (current) drug therapy: Secondary | ICD-10-CM

## 2018-12-15 DIAGNOSIS — Z803 Family history of malignant neoplasm of breast: Secondary | ICD-10-CM

## 2018-12-15 DIAGNOSIS — R531 Weakness: Secondary | ICD-10-CM | POA: Diagnosis not present

## 2018-12-15 DIAGNOSIS — C7951 Secondary malignant neoplasm of bone: Secondary | ICD-10-CM | POA: Diagnosis not present

## 2018-12-15 DIAGNOSIS — M1990 Unspecified osteoarthritis, unspecified site: Secondary | ICD-10-CM

## 2018-12-15 LAB — COMPREHENSIVE METABOLIC PANEL
ALT: 11 U/L (ref 0–44)
AST: 28 U/L (ref 15–41)
Albumin: 3.9 g/dL (ref 3.5–5.0)
Alkaline Phosphatase: 87 U/L (ref 38–126)
Anion gap: 10 (ref 5–15)
BUN: 7 mg/dL — ABNORMAL LOW (ref 8–23)
CO2: 30 mmol/L (ref 22–32)
Calcium: 8.5 mg/dL — ABNORMAL LOW (ref 8.9–10.3)
Chloride: 100 mmol/L (ref 98–111)
Creatinine, Ser: 1.2 mg/dL — ABNORMAL HIGH (ref 0.44–1.00)
GFR calc Af Amer: 56 mL/min — ABNORMAL LOW (ref >60)
GFR calc non Af Amer: 48 mL/min — ABNORMAL LOW (ref >60)
Glucose, Bld: 110 mg/dL — ABNORMAL HIGH (ref 70–99)
Potassium: 2.6 mmol/L — CL (ref 3.5–5.1)
Sodium: 140 mmol/L (ref 135–145)
Total Bilirubin: 0.3 mg/dL (ref 0.3–1.2)
Total Protein: 7.8 g/dL (ref 6.5–8.1)

## 2018-12-15 LAB — CBC WITH DIFFERENTIAL/PLATELET
Abs Immature Granulocytes: 0.02 10*3/uL (ref 0.00–0.07)
Basophils Absolute: 0 10*3/uL (ref 0.0–0.1)
Basophils Relative: 1 %
Eosinophils Absolute: 0 10*3/uL (ref 0.0–0.5)
Eosinophils Relative: 0 %
HCT: 29.4 % — ABNORMAL LOW (ref 36.0–46.0)
Hemoglobin: 9.5 g/dL — ABNORMAL LOW (ref 12.0–15.0)
Immature Granulocytes: 1 %
Lymphocytes Relative: 13 %
Lymphs Abs: 0.6 10*3/uL — ABNORMAL LOW (ref 0.7–4.0)
MCH: 32.8 pg (ref 26.0–34.0)
MCHC: 32.3 g/dL (ref 30.0–36.0)
MCV: 101.4 fL — ABNORMAL HIGH (ref 80.0–100.0)
Monocytes Absolute: 0.5 10*3/uL (ref 0.1–1.0)
Monocytes Relative: 11 %
Neutro Abs: 3.2 10*3/uL (ref 1.7–7.7)
Neutrophils Relative %: 74 %
Platelets: 233 10*3/uL (ref 150–400)
RBC: 2.9 MIL/uL — ABNORMAL LOW (ref 3.87–5.11)
RDW: 14 % (ref 11.5–15.5)
WBC: 4.3 10*3/uL (ref 4.0–10.5)
nRBC: 0 % (ref 0.0–0.2)

## 2018-12-15 MED ORDER — SODIUM CHLORIDE 0.9 % IV SOLN
Freq: Once | INTRAVENOUS | Status: AC
  Administered 2018-12-15: 10:00:00 via INTRAVENOUS
  Filled 2018-12-15: qty 250

## 2018-12-15 MED ORDER — HEPARIN SOD (PORK) LOCK FLUSH 100 UNIT/ML IV SOLN: 500.0000 [IU] | Freq: Once | INTRAVENOUS | Status: DC | PRN

## 2018-12-15 MED ORDER — ZOLEDRONIC ACID 4 MG/100ML IV SOLN
4.0000 mg | Freq: Once | INTRAVENOUS | Status: AC
  Administered 2018-12-15: 11:00:00 4 mg via INTRAVENOUS
  Filled 2018-12-15: qty 100

## 2018-12-15 MED ORDER — SODIUM CHLORIDE 0.9 % IV SOLN
10.0000 mg/kg | Freq: Once | INTRAVENOUS | Status: AC
  Administered 2018-12-15: 500 mg via INTRAVENOUS
  Filled 2018-12-15: qty 50

## 2018-12-15 MED ORDER — ACETAMINOPHEN 325 MG PO TABS
650.0000 mg | ORAL_TABLET | Freq: Once | ORAL | Status: AC
  Administered 2018-12-15: 650 mg via ORAL
  Filled 2018-12-15: qty 2

## 2018-12-15 MED ORDER — SODIUM CHLORIDE 0.9 % IV SOLN
75.0000 mg/m2 | Freq: Once | INTRAVENOUS | Status: AC
  Administered 2018-12-15: 110 mg via INTRAVENOUS
  Filled 2018-12-15: qty 11

## 2018-12-15 MED ORDER — DEXAMETHASONE SODIUM PHOSPHATE 10 MG/ML IJ SOLN
10.0000 mg | Freq: Once | INTRAMUSCULAR | Status: AC
  Administered 2018-12-15: 10 mg via INTRAVENOUS
  Filled 2018-12-15: qty 1

## 2018-12-15 MED ORDER — DIPHENHYDRAMINE HCL 50 MG/ML IJ SOLN
25.0000 mg | Freq: Once | INTRAMUSCULAR | Status: AC
  Administered 2018-12-15: 25 mg via INTRAVENOUS
  Filled 2018-12-15: qty 1

## 2018-12-15 MED ORDER — HEPARIN SOD (PORK) LOCK FLUSH 100 UNIT/ML IV SOLN
500.0000 [IU] | Freq: Once | INTRAVENOUS | Status: AC
  Administered 2018-12-15: 15:00:00 500 [IU] via INTRAVENOUS
  Filled 2018-12-15: qty 5

## 2018-12-15 MED ORDER — PEGFILGRASTIM 6 MG/0.6ML ~~LOC~~ PSKT
6.0000 mg | PREFILLED_SYRINGE | Freq: Once | SUBCUTANEOUS | Status: AC
  Administered 2018-12-15: 15:00:00 6 mg via SUBCUTANEOUS
  Filled 2018-12-15: qty 0.6

## 2018-12-15 MED ORDER — SODIUM CHLORIDE 0.9 % IV SOLN
40.0000 meq | Freq: Once | INTRAVENOUS | Status: AC
  Administered 2018-12-15: 40 meq via INTRAVENOUS
  Filled 2018-12-15: qty 20

## 2018-12-15 MED ORDER — SODIUM CHLORIDE 0.9% FLUSH
10.0000 mL | Freq: Once | INTRAVENOUS | Status: AC
  Administered 2018-12-15: 09:00:00 10 mL via INTRAVENOUS
  Filled 2018-12-15: qty 10

## 2018-12-15 NOTE — Progress Notes (Signed)
Patient denies any concerns today.  

## 2018-12-19 ENCOUNTER — Emergency Department
Admission: EM | Admit: 2018-12-19 | Discharge: 2018-12-19 | Disposition: A | Discharge status: Home / Self Care | Payer: BLUE CROSS/BLUE SHIELD | Attending: Emergency Medicine | Admitting: Emergency Medicine

## 2018-12-19 ENCOUNTER — Encounter: Payer: Self-pay | Admitting: Emergency Medicine

## 2018-12-19 ENCOUNTER — Emergency Department: Payer: BLUE CROSS/BLUE SHIELD

## 2018-12-19 ENCOUNTER — Other Ambulatory Visit: Payer: Self-pay

## 2018-12-19 DIAGNOSIS — E039 Hypothyroidism, unspecified: Secondary | ICD-10-CM

## 2018-12-19 DIAGNOSIS — I10 Essential (primary) hypertension: Secondary | ICD-10-CM | POA: Diagnosis not present

## 2018-12-19 DIAGNOSIS — R1310 Dysphagia, unspecified: Secondary | ICD-10-CM | POA: Diagnosis not present

## 2018-12-19 DIAGNOSIS — E876 Hypokalemia: Secondary | ICD-10-CM

## 2018-12-19 DIAGNOSIS — Z79899 Other long term (current) drug therapy: Secondary | ICD-10-CM | POA: Diagnosis not present

## 2018-12-19 DIAGNOSIS — Z87891 Personal history of nicotine dependence: Secondary | ICD-10-CM | POA: Insufficient documentation

## 2018-12-19 DIAGNOSIS — C3490 Malignant neoplasm of unspecified part of unspecified bronchus or lung: Secondary | ICD-10-CM | POA: Diagnosis not present

## 2018-12-19 LAB — COMPREHENSIVE METABOLIC PANEL WITH GFR
ALT: 16 U/L (ref 0–44)
AST: 34 U/L (ref 15–41)
Albumin: 3.7 g/dL (ref 3.5–5.0)
Alkaline Phosphatase: 112 U/L (ref 38–126)
Anion gap: 11 (ref 5–15)
BUN: 12 mg/dL (ref 8–23)
CO2: 28 mmol/L (ref 22–32)
Calcium: 7.8 mg/dL — ABNORMAL LOW (ref 8.9–10.3)
Chloride: 100 mmol/L (ref 98–111)
Creatinine, Ser: 1.2 mg/dL — ABNORMAL HIGH (ref 0.44–1.00)
GFR calc Af Amer: 56 mL/min — ABNORMAL LOW
GFR calc non Af Amer: 48 mL/min — ABNORMAL LOW
Glucose, Bld: 149 mg/dL — ABNORMAL HIGH (ref 70–99)
Potassium: 2.6 mmol/L — CL (ref 3.5–5.1)
Sodium: 139 mmol/L (ref 135–145)
Total Bilirubin: 0.4 mg/dL (ref 0.3–1.2)
Total Protein: 7.1 g/dL (ref 6.5–8.1)

## 2018-12-19 LAB — CBC WITH DIFFERENTIAL/PLATELET
Abs Immature Granulocytes: 1.05 K/uL — ABNORMAL HIGH (ref 0.00–0.07)
Basophils Absolute: 0.2 K/uL — ABNORMAL HIGH (ref 0.0–0.1)
Basophils Relative: 3 %
Eosinophils Absolute: 0 K/uL (ref 0.0–0.5)
Eosinophils Relative: 0 %
HCT: 28 % — ABNORMAL LOW (ref 36.0–46.0)
Hemoglobin: 9.1 g/dL — ABNORMAL LOW (ref 12.0–15.0)
Immature Granulocytes: 19 %
Lymphocytes Relative: 4 %
Lymphs Abs: 0.2 K/uL — ABNORMAL LOW (ref 0.7–4.0)
MCH: 32.5 pg (ref 26.0–34.0)
MCHC: 32.5 g/dL (ref 30.0–36.0)
MCV: 100 fL (ref 80.0–100.0)
Monocytes Absolute: 0.1 K/uL (ref 0.1–1.0)
Monocytes Relative: 2 %
Neutro Abs: 4.1 K/uL (ref 1.7–7.7)
Neutrophils Relative %: 72 %
Platelets: 105 K/uL — ABNORMAL LOW (ref 150–400)
RBC: 2.8 MIL/uL — ABNORMAL LOW (ref 3.87–5.11)
RDW: 13.9 % (ref 11.5–15.5)
Smear Review: ADEQUATE
WBC: 5.6 K/uL (ref 4.0–10.5)
nRBC: 0 % (ref 0.0–0.2)

## 2018-12-19 LAB — MAGNESIUM: Magnesium: 1.3 mg/dL — ABNORMAL LOW (ref 1.7–2.4)

## 2018-12-19 LAB — TSH: TSH: 5.122 u[IU]/mL — ABNORMAL HIGH (ref 0.350–4.500)

## 2018-12-19 MED ORDER — IOHEXOL 300 MG/ML  SOLN
75.0000 mL | Freq: Once | INTRAMUSCULAR | Status: AC | PRN
  Administered 2018-12-19: 75 mL via INTRAVENOUS

## 2018-12-19 MED ORDER — POTASSIUM CHLORIDE 20 MEQ PO PACK
40.0000 meq | PACK | Freq: Two times a day (BID) | ORAL | Status: DC
  Administered 2018-12-19: 40 meq via ORAL
  Filled 2018-12-19: qty 2

## 2018-12-19 MED ORDER — MAGNESIUM SULFATE 2 GM/50ML IV SOLN
2.0000 g | Freq: Once | INTRAVENOUS | Status: AC
  Administered 2018-12-19: 2 g via INTRAVENOUS
  Filled 2018-12-19: qty 50

## 2018-12-19 MED ORDER — POTASSIUM CHLORIDE 10 MEQ/100ML IV SOLN: 10.0000 meq | Freq: Once | INTRAVENOUS | Status: DC

## 2018-12-19 MED ORDER — POTASSIUM CHLORIDE 10 MEQ/100ML IV SOLN
10.0000 meq | Freq: Once | INTRAVENOUS | Status: AC
  Administered 2018-12-19: 10 meq via INTRAVENOUS
  Filled 2018-12-19: qty 100

## 2018-12-19 MED ORDER — POTASSIUM CHLORIDE CRYS ER 20 MEQ PO TBCR: 40.0000 meq | EXTENDED_RELEASE_TABLET | Freq: Two times a day (BID) | ORAL | Status: DC

## 2018-12-19 MED ORDER — POTASSIUM CHLORIDE ER 10 MEQ PO TBCR: 10.0000 meq | EXTENDED_RELEASE_TABLET | Freq: Every day | ORAL | 0 refills | Status: DC

## 2018-12-19 MED ORDER — LIDOCAINE VISCOUS HCL 2 % MT SOLN: 15.0000 mL | Freq: Once | OROMUCOSAL | Status: DC

## 2018-12-19 MED ORDER — POTASSIUM CHLORIDE CRYS ER 20 MEQ PO TBCR: 40.0000 meq | EXTENDED_RELEASE_TABLET | Freq: Once | ORAL | Status: DC

## 2018-12-19 NOTE — ED Triage Notes (Signed)
States trouble swallowing x 2 days. Denies sore throat. Smile symmetrical. Grips equal.

## 2018-12-19 NOTE — Discharge Instructions (Addendum)
I have talked to GI and they are planning to call you on Monday for an appointment either Monday or Wednesday to discuss your difficulty swallowing and schedule you for additional testing. Your potassium and magnesium were low in the emergency department and you were given supplements for this.  I have given you a prescription for potassium for a couple of days.  Please follow-up with oncology on Tuesday for your repeat blood blood work.  You thyroid function was also a bit low. Please follow up with primary care this week to recheck and discuss your medication dose.

## 2018-12-19 NOTE — ED Provider Notes (Signed)
[facility] Emergency Department Provider Note  ____________________________________________  Time seen: Approximately 12:24 PM  I have reviewed the triage vital signs and the nursing notes.   HISTORY  Chief Complaint Dysphagia    HPI Janet [name] is a 63 y.o. female with stage IV lung cancer presents to the emergency department for evaluation of difficulty swallowing solids for a while that worsened this week.  She feels like her food is getting stuck.  She was able to drink her Ensure this morning.  She had one episode of vomiting this morning.  Patient states that she has talked with her oncologist about this in the past.  She had chemo last Tuesday.  No fevers, shortness of breath, chest pain, diarrhea.   Past Medical History:  Diagnosis Date  . Arthritis    right shoulder  . Cancer of right lung ([place]) 08/2017   Chemo + rad tx's.   Marland Kitchen Hyperlipidemia   . Hypertension   . Hypothyroidism   . Menopausal state   . Personal history of chemotherapy 2019   lung ca  . Personal history of radiation therapy 2019   lung ca  . Thyroid disease     Patient Active Problem List   Diagnosis Date Noted  . Goals of care, counseling/discussion 08/25/2017  . Cancer of upper lobe of right lung ([place]) 07/09/2017  . Hypertension 08/04/2015  . Hypothyroidism 08/04/2015  . Hyperlipidemia 08/04/2015  . Osteoma 01/16/2015    Past Surgical History:  Procedure Laterality Date  . ABDOMINAL HYSTERECTOMY  2005  . BREAST EXCISIONAL BIOPSY Right 1979   benign  . COLONOSCOPY WITH PROPOFOL N/A 05/13/2017   Procedure: COLONOSCOPY WITH PROPOFOL;  Surgeon: [name], MD;  Location: [facility] ENDOSCOPY;  Service: Gastroenterology;  Laterality: N/A;  . CYSTECTOMY Right    breast  . CYSTECTOMY  02/2015   back of neck  . ESOPHAGOGASTRODUODENOSCOPY (EGD) WITH PROPOFOL N/A 05/13/2017   Procedure: ESOPHAGOGASTRODUODENOSCOPY (EGD) WITH PROPOFOL;  Surgeon: [name], MD;  Location:  [facility] ENDOSCOPY;  Service: Gastroenterology;  Laterality: N/A;  . PORTA CATH INSERTION N/A 09/01/2017   Procedure: PORTA CATH INSERTION;  Surgeon: Algernon Huxley, MD;  Location: [facility] CV LAB;  Service: Cardiovascular;  Laterality: N/A;  . [place]    Prior to Admission medications   Medication Sig Start Date End Date Taking? Authorizing Provider  amLODipine (NORVASC) 5 MG tablet TAKE 1 TABLET BY MOUTH DAILY Patient taking differently: Take 5 mg by mouth daily.  09/24/18  Yes Volney American, PA-C  atorvastatin (LIPITOR) 10 MG tablet TAKE 1 TABLET DAILY AT 6 P.M. Patient taking differently: Take 10 mg by mouth daily at 6 PM.  09/23/18  Yes Volney American, PA-C  DULoxetine (CYMBALTA) 30 MG capsule Take 1 capsule (30 mg total) by mouth daily. 10/06/18  Yes Borders, Kirt Boys, NP  folic acid (FOLVITE) 1 MG tablet Take 1 tablet (1 mg total) by mouth daily. 09/22/18  Yes Lloyd Huger, MD  levothyroxine (SYNTHROID, LEVOTHROID) 50 MCG tablet TAKE 1 TABLET DAILY Patient taking differently: Take 50 mcg by mouth daily.  10/20/17  Yes Kathrine Haddock, NP  lidocaine-prilocaine (EMLA) cream Apply 1 application topically daily as needed (access).  03/26/18  Yes [provider]  Oxycodone HCl 10 MG TABS Take 1 tablet (10 mg total) by mouth every 6 (six) hours as needed. 05/13/18  Yes Lloyd Huger, MD  potassium chloride (K-DUR) 10 MEQ tablet Take 1 tablet (10 mEq  total) by mouth daily. 12/19/18   Laban Emperor, PA-C  prochlorperazine (COMPAZINE) 10 MG tablet Take 1 tablet (10 mg total) by mouth every 6 (six) hours as needed (Nausea or vomiting). 08/25/17 10/22/17  Lloyd Huger, MD    Allergies Patient has no known allergies.  Family History  Problem Relation Age of Onset  . Hypertension Mother   . Stroke Mother   . Leukemia Mother   . Stroke Father   . Pneumonia Father   . Diabetes Sister   . Hyperlipidemia Sister   . Breast cancer Maternal Aunt 70     Social History Social History   Tobacco Use  . Smoking status: Former Smoker    Packs/day: 0.25    Types: Cigarettes    Last attempt to quit: 02/01/2017    Years since quitting: 1.8  . Smokeless tobacco: Never Used  Substance Use Topics  . Alcohol use: No    Alcohol/week: 0.0 standard drinks  . Drug use: No     Review of Systems  Constitutional: No fever/chills ENT: No upper respiratory complaints. Cardiovascular: No chest pain. Respiratory:  No SOB. Gastrointestinal: No abdominal pain.  No nausea, no vomiting.  Musculoskeletal: Negative for musculoskeletal pain. Skin: Negative for rash, abrasions, lacerations, ecchymosis. Neurological: Negative for headaches   ____________________________________________   PHYSICAL EXAM:  VITAL SIGNS: ED Triage Vitals  Enc Vitals Group     BP 12/19/18 1151 121/66     Pulse Rate 12/19/18 1151 99     Resp 12/19/18 1151 18     Temp 12/19/18 1151 98.2 F (36.8 C)     Temp Source 12/19/18 1151 Oral     SpO2 12/19/18 1151 100 %     Weight 12/19/18 1152 103 lb (46.7 kg)     Height 12/19/18 1152 5' (1.524 m)     Head Circumference --      Peak Flow --      Pain Score 12/19/18 1152 0     Pain Loc --      Pain Edu? --      Excl. in [place]? --      Constitutional: Alert and oriented. Well appearing and in no acute distress. Eyes: Conjunctivae are normal. PERRL. EOMI. Head: Atraumatic. ENT:      Ears:      Nose: No congestion/rhinnorhea.      Mouth/Throat: Mucous membranes are moist.  Neck: No stridor.  Cardiovascular: Normal rate, regular rhythm.  Good peripheral circulation. Respiratory: Normal respiratory effort without tachypnea or retractions. Lungs CTAB. Good air entry to the bases with no decreased or absent breath sounds. Musculoskeletal: Full range of motion to all extremities. No gross deformities appreciated. Neurologic:  Normal speech and language. No gross focal neurologic deficits are appreciated.  Skin:  Skin is  warm, dry and intact. No rash noted. Psychiatric: Mood and affect are normal. Speech and behavior are normal. Patient exhibits appropriate insight and judgement.   ____________________________________________   LABS (all labs ordered are listed, but only abnormal results are displayed)  Labs Reviewed  CBC WITH DIFFERENTIAL/PLATELET - Abnormal; Notable for the following components:      Result Value   RBC 2.80 (*)    Hemoglobin 9.1 (*)    HCT 28.0 (*)    Platelets 105 (*)    Lymphs Abs 0.2 (*)    Basophils Absolute 0.2 (*)    Abs Immature Granulocytes 1.05 (*)    All other components within normal limits  COMPREHENSIVE METABOLIC PANEL - Abnormal;  Notable for the following components:   Potassium 2.6 (*)    Glucose, Bld 149 (*)    Creatinine, Ser 1.20 (*)    Calcium 7.8 (*)    GFR calc non Af Amer 48 (*)    GFR calc Af Amer 56 (*)    All other components within normal limits  TSH - Abnormal; Notable for the following components:   TSH 5.122 (*)    All other components within normal limits  MAGNESIUM - Abnormal; Notable for the following components:   Magnesium 1.3 (*)    All other components within normal limits   ____________________________________________  EKG   ____________________________________________  RADIOLOGY Robinette Haines, personally viewed and evaluated these images (plain radiographs) as part of my medical decision making, as well as reviewing the written report by the radiologist.  Ct Soft Tissue Neck W Contrast  Result Date: 12/19/2018 CLINICAL DATA:  Difficulty swallowing. Stage IV lung cancer. Dysphagia. EXAM: CT NECK WITH CONTRAST TECHNIQUE: Multidetector CT imaging of the neck was performed using the standard protocol following the bolus administration of intravenous contrast. CONTRAST:  34mL OMNIPAQUE IOHEXOL 300 MG/ML  SOLN COMPARISON:  CT of the neck 11/21/2014 FINDINGS: Pharynx and larynx: No focal mucosal or submucosal lesions are present.  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 Oropharynx is within normal limits. Epiglottis is normal. Vocal cords are midline and symmetric. Trachea is normal. Salivary glands: Submandibular and parotid glands are within normal limits bilaterally. Thyroid: 8 mm nodule in the left lobe of the thyroid is stable. Lymph nodes: No significant cervical adenopathy is present. Vascular: Atherosclerotic calcifications are present within the carotid There is no significant arteries stenosis relative to the more distal vessels. Limited intracranial: Within normal limits. Visualized orbits: The globes and orbits are within normal limits. Mastoids and visualized paranasal sinuses: The paranasal sinuses and mastoid air cells are clear. Skeleton: Previously noted exostosis adjacent to the left mastoid has been resected. No focal lytic or blastic lesions are present. Upper chest: Post radiation changes in the right upper lobe are stable. No new lesions are present. Extensive atherosclerotic changes are noted at the aortic arch. Left IJ Port-A-Cath is in place. Other: There is generalized loss of subcutaneous fat. IMPRESSION: 1. No evidence for metastatic disease to the neck or other focal etiology to explain dysphagia. 2. Generalized cachexia. 3. Post treatment changes in the right upper lobe. Electronically Signed   By: San Morelle M.D.   On: 12/19/2018 14:02   Dg Chest Portable 1 View  Result Date: 12/19/2018 CLINICAL DATA:  Known lung cancer.  Dysphagia. EXAM: PORTABLE CHEST 1 VIEW COMPARISON:  CT scan Dec 14, 2018 FINDINGS: A left Port-A-Cath terminates near the caval atrial junction. Post therapeutic changes are seen in the right paramediastinal region. The pulmonary metastases seen on the comparison CT scan are not visualized on this study. No other interval changes. IMPRESSION: Post therapeutic changes in the paramediastinal right upper lobe. No other acute abnormalities. Electronically  Signed   By: Dorise Bullion III M.D   On: 12/19/2018 13:40    ____________________________________________    PROCEDURES  Procedure(s) performed:    Procedures    Medications  iohexol (OMNIPAQUE) 300 MG/ML solution 75 mL (75 mLs Intravenous Contrast Given 12/19/18 1319)  potassium chloride 10 mEq in 100 mL IVPB (0 mEq Intravenous Stopped 12/19/18 1515)  magnesium sulfate IVPB 2 g 50 mL (0 g Intravenous Stopped 12/19/18 1639)     ____________________________________________   INITIAL IMPRESSION /  ASSESSMENT AND PLAN / ED COURSE  Pertinent labs & imaging results that were available during my care of the patient were reviewed by me and considered in my medical decision making (see chart for details).  Review of the [place] CSRS was performed in accordance of the [place] prior to dispensing any controlled drugs.   Patient presented to the emergency department for evaluation of some chronic dysphasia.  Vital signs and exam are reassuring. CT negative for acute processes. Lab work remarkable for hypokalemia at 2.5, which is fairly chronic and patient sees oncology for.  Potassium was supplemented in the emergency department.  Magnesium was also low at 1.3, which we have also supplemented.  Creatinine and GFR are stable.  Patient is mildly anemic, which is chronic. TSH is elevated and patient is on synthroid. She will follow up with primary care for recheck and medication management.  Patient is eating crackers and drinking juice in the emergency department.  Dr. Vicente Males with GI was consulted and has plans to follow-up with patient outpatient on Monday or Wednesday.  Patient has an appointment with oncology on Tuesday.  Dr. Cherylann Banas has evaluated the patient and is agreeable with plan of care.  Patient will be discharged home with prescriptions for potassium. Patient is to follow up with primary care, oncology, and GI as directed. Patient is given ED precautions to return to the ED for any worsening or  new symptoms.  Deniese Oberry Afzal was evaluated in Emergency Department on 12/19/2018 for the symptoms described in the history of present illness. She was evaluated in the context of the global COVID-19 pandemic, which necessitated consideration that the patient might be at risk for infection with the SARS-CoV-2 virus that causes COVID-19. Institutional protocols and algorithms that pertain to the evaluation of patients at risk for COVID-19 are in a state of rapid change based on information released by regulatory bodies including the CDC and federal and state organizations. These policies and algorithms were followed during the patient's care in the ED.   ____________________________________________  FINAL CLINICAL IMPRESSION(S) / ED DIAGNOSES  Final diagnoses:  Dysphagia, unspecified type  Hypokalemia  Hypomagnesemia  Hypothyroidism, unspecified type      NEW MEDICATIONS STARTED DURING THIS VISIT:  ED Discharge Orders         Ordered    potassium chloride (K-DUR) 10 MEQ tablet  Daily     12/19/18 1556              This chart was dictated using voice recognition software/Dragon. Despite best efforts to proofread, errors can occur which can change the meaning. Any change was purely unintentional.    Laban Emperor, PA-C 12/19/18 2058    Arta Silence, MD 12/20/18 [phone]

## 2018-12-21 ENCOUNTER — Other Ambulatory Visit: Payer: Self-pay

## 2018-12-21 ENCOUNTER — Ambulatory Visit (INDEPENDENT_AMBULATORY_CARE_PROVIDER_SITE_OTHER): Payer: BLUE CROSS/BLUE SHIELD | Admitting: Gastroenterology

## 2018-12-21 DIAGNOSIS — R1310 Dysphagia, unspecified: Secondary | ICD-10-CM | POA: Diagnosis not present

## 2018-12-21 NOTE — Progress Notes (Signed)
Janet [name] , MD [address]  [place], [place] [address]  Main: [phone]  Fax: [phone]   Primary Care Physician: Volney American, PA-C  Virtual Visit via Telephone Note  I connected with patient on 12/21/18 at 11:30 AM EDT by telephone and verified that I am speaking with the correct person using two identifiers.   I discussed the limitations, risks, security and privacy concerns of performing an evaluation and management service by telephone and the availability of in person appointments. I also discussed with the patient that there may be Janet patient responsible charge related to this service. The patient expressed understanding and agreed to proceed.  Location of Patient: Home Location of Provider: Home Persons involved: Patient and provider only   History of Present Illness: Chief Complaint  Patient presents with   Hospitalization Follow-up    Dysphagia, discuss EGD    HPI: Janet [name] is Janet 63 y.o. female has Janet history of stage IV lung cancer and presented to the emergency room on 12/19/2018 with issues of food getting stuck.  She is on chemotherapy.  She underwent Janet CT scan of the neck which showed no evidence of metastatic disease.  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  Mildly progressive left adrenal metastasis.  I have myself performed of upper endoscopy in October 2018 and similarly was for evaluation of dysphagia but no abnormality was seen.  Esophageal biopsies were negative for eosinophils.  She is undergoing palliative care treatment.   She says that she has been having difficulty swallowing solids and liquids for Janet few months which has been gradually getting worse.  She states that it at the level of the Adam's apple that she feels it is abnormal.  Denies any coughing.  Denies any clear heartburn.  Current Outpatient Medications  Medication Sig  Dispense Refill   amLODipine (NORVASC) 5 MG tablet TAKE 1 TABLET BY MOUTH DAILY (Patient taking differently: Take 5 mg by mouth daily. ) 30 tablet 0   atorvastatin (LIPITOR) 10 MG tablet TAKE 1 TABLET DAILY AT 6 P.M. (Patient taking differently: Take 10 mg by mouth daily at 6 PM. ) 10 tablet 0   DULoxetine (CYMBALTA) 30 MG capsule Take 1 capsule (30 mg total) by mouth daily. 30 capsule 3   folic acid (FOLVITE) 1 MG tablet Take 1 tablet (1 mg total) by mouth daily. 90 tablet 4   levothyroxine (SYNTHROID, LEVOTHROID) 50 MCG tablet TAKE 1 TABLET DAILY (Patient taking differently: Take 50 mcg by mouth daily. ) 90 tablet 3   lidocaine-prilocaine (EMLA) cream Apply 1 application topically daily as needed (access).   3   Oxycodone HCl 10 MG TABS Take 1 tablet (10 mg total) by mouth every 6 (six) hours as needed. 120 tablet 0   potassium chloride (K-DUR) 10 MEQ tablet Take 1 tablet (10 mEq total) by mouth daily. 5 tablet 0   No current facility-administered medications for this visit.     Allergies as of 12/21/2018   (No Known Allergies)    Review of Systems:    All systems reviewed and negative except where noted in HPI.   Observations/Objective:  Labs: CMP     Component Value Date/Time   NA 139 12/19/2018 1227   NA 147 (H) 07/09/2017 1615   K 2.6 (LL) 12/19/2018 1227   CL 100 12/19/2018 1227   CO2 28 12/19/2018 1227  GLUCOSE 149 (H) 12/19/2018 1227   BUN 12 12/19/2018 1227   BUN 8 07/09/2017 1615   CREATININE 1.20 (H) 12/19/2018 1227   CALCIUM 7.8 (L) 12/19/2018 1227   PROT 7.1 12/19/2018 1227   PROT 6.6 07/09/2017 1615   ALBUMIN 3.7 12/19/2018 1227   ALBUMIN 4.3 07/09/2017 1615   AST 34 12/19/2018 1227   ALT 16 12/19/2018 1227   ALKPHOS 112 12/19/2018 1227   BILITOT 0.4 12/19/2018 1227   BILITOT <0.2 07/09/2017 1615   GFRNONAA 48 (L) 12/19/2018 1227   GFRAA 56 (L) 12/19/2018 1227   Lab Results  Component Value Date   WBC 5.6 12/19/2018   HGB 9.1 (L) 12/19/2018     HCT 28.0 (L) 12/19/2018   MCV 100.0 12/19/2018   PLT 105 (L) 12/19/2018    Imaging Studies: Ct Soft Tissue Neck W Contrast  Result Date: 12/19/2018 CLINICAL DATA:  Difficulty swallowing. Stage IV lung cancer. Dysphagia. EXAM: CT NECK WITH CONTRAST TECHNIQUE: Multidetector CT imaging of the neck was performed using the standard protocol following the bolus administration of intravenous contrast. CONTRAST:  36mL OMNIPAQUE IOHEXOL 300 MG/ML  SOLN COMPARISON:  CT of the neck 11/21/2014 FINDINGS: Pharynx and larynx: No focal mucosal or submucosal lesions are present. 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 Oropharynx is within normal limits. Epiglottis is normal. Vocal cords are midline and symmetric. Trachea is normal. Salivary glands: Submandibular and parotid glands are within normal limits bilaterally. Thyroid: 8 mm nodule in the left lobe of the thyroid is stable. Lymph nodes: No significant cervical adenopathy is present. Vascular: Atherosclerotic calcifications are present within the carotid There is no significant arteries stenosis relative to the more distal vessels. Limited intracranial: Within normal limits. Visualized orbits: The globes and orbits are within normal limits. Mastoids and visualized paranasal sinuses: The paranasal sinuses and mastoid air cells are clear. Skeleton: Previously noted exostosis adjacent to the left mastoid has been resected. No focal lytic or blastic lesions are present. Upper chest: Post radiation changes in the right upper lobe are stable. No new lesions are present. Extensive atherosclerotic changes are noted at the aortic arch. Left IJ Port-Janet-Cath is in place. Other: There is generalized loss of subcutaneous fat. IMPRESSION: 1. No evidence for metastatic disease to the neck or other focal etiology to explain dysphagia. 2. Generalized cachexia. 3. Post treatment changes in the right upper lobe. Electronically Signed   By: San Morelle M.D.   On: 12/19/2018 14:02   Ct Chest W Contrast  Result Date: 12/04/2018 CLINICAL DATA:  Right upper lobe lung cancer, status post chemotherapy and radiation EXAM: CT CHEST, ABDOMEN, AND PELVIS WITH CONTRAST TECHNIQUE: Multidetector CT imaging of the chest, abdomen and pelvis was performed following the standard protocol during bolus administration of intravenous contrast. CONTRAST:  74mL OMNIPAQUE IOHEXOL 300 MG/ML  SOLN COMPARISON:  CT chest abdomen pelvis dated 07/08/2018. PET-CT dated 03/20/2018. FINDINGS: CT CHEST FINDINGS Cardiovascular: Heart is normal in size.  No pericardial effusion. No evidence of thoracic aortic aneurysm. Atherosclerotic calcifications of the aortic arch. Right chest port terminates the cavoatrial junction. Mediastinum/Nodes: No suspicious mediastinal lymphadenopathy. 8 mm left thyroid nodule. Lungs/Pleura: Paramediastinal radiation changes, right upper lung predominant. Additional radiation changes in the superior segment right upper lobe (series 5/image 49). 8 x 11 mm thick-walled cavitary right lower lobe nodule (series 5/image 84), previously 7 x 9 mm. 8 mm left lower lobe nodule (series 5/image 86), previously 6 mm. Additional scattered small bilateral pulmonary nodules,  many of which are subpleural, mildly progressive. No focal consolidation. No pleural effusion or pneumothorax. Musculoskeletal: Degenerative changes of the thoracic spine. CT ABDOMEN PELVIS FINDINGS Hepatobiliary: Progressive hepatic metastases, including: --3.1 x 2.3 cm metastasis in segment 7 (series 4/image 40), previously 1.7 x 1.8 cm --2.5 x 2.7 cm metastasis in segment 4B (series 4/image 47), previously 1.3 cm Gallbladder is unremarkable. No intrahepatic or extrahepatic ductal dilatation. Pancreas: Within normal limits. Spleen: Within normal limits. Adrenals/Urinary Tract: 1.5 x 0.7 cm left adrenal metastasis (series 4/image 48), previously 12 mm. Right adrenal glands are within normal limits.  Kidneys are within normal limits.  No hydronephrosis. Bladder is within normal limits. Stomach/Bowel: Stomach is within normal limits. No evidence of bowel obstruction. Normal appendix (series 4/image 80). Vascular/Lymphatic: No evidence of abdominal aortic aneurysm. Atherosclerotic calcifications of the abdominal aorta and branch vessels. No suspicious abdominopelvic lymphadenopathy. Reproductive: Status post hysterectomy. No adnexal masses. Other: No abdominopelvic ascites. Musculoskeletal: Visualized osseous structures are within normal limits. IMPRESSION: Progressive pulmonary metastases, including Janet dominant 11 mm right lower lobe nodule. Progressive hepatic metastases, with index lesions as above. Mildly progressive left adrenal metastasis. Known osseous metastases are poorly evaluated on CT. Electronically Signed   By: Julian Hy M.D.   On: 12/04/2018 14:09   Nm Bone Scan Whole Body  Result Date: 12/03/2018 CLINICAL DATA:  Cancer of upper lobe of right lung. EXAM: NUCLEAR MEDICINE WHOLE BODY BONE SCAN TECHNIQUE: Whole body anterior and posterior images were obtained approximately 3 hours after intravenous injection of radiopharmaceutical. RADIOPHARMACEUTICALS:  21.65 mCi Technetium-55m MDP IV COMPARISON:  Bone scan and CT scans of July 08, 2018. PET scan of March 20, 2018. FINDINGS: Stable focus of abnormal uptake is seen involving the left acromion consistent with metastatic disease. Stable abnormal uptake is seen involving left iliac bone anteriorly consistent with metastatic disease. Stable focus of abnormal uptake is seen involving the posterior portion of the left iliac bone also consistent with metastatic disease. Abnormal uptake is seen involving the anterior portion of right lower rib which is slightly improved compared to prior exam. Abnormal uptake is noted in both feet consistent with degenerative change. Mild focus of abnormal uptake is seen involving the lower sternum concerning  for metastatic disease. Abnormal uptake is also seen involving the left inferior pubic ramus which is unchanged compared to prior exam. No new areas of abnormal uptake are noted. IMPRESSION: Continued presence of abnormal foci of abnormal uptake is seen involving the left inferior pubic ramus, left acromion, left iliac bone, lower sternum and right lower rib consistent with metastatic disease. No new areas of abnormal uptake are noted. Electronically Signed   By: Marijo Conception M.D.   On: 12/03/2018 14:58   Ct Abdomen Pelvis W Contrast  Result Date: 12/04/2018 CLINICAL DATA:  Right upper lobe lung cancer, status post chemotherapy and radiation EXAM: CT CHEST, ABDOMEN, AND PELVIS WITH CONTRAST TECHNIQUE: Multidetector CT imaging of the chest, abdomen and pelvis was performed following the standard protocol during bolus administration of intravenous contrast. CONTRAST:  51mL OMNIPAQUE IOHEXOL 300 MG/ML  SOLN COMPARISON:  CT chest abdomen pelvis dated 07/08/2018. PET-CT dated 03/20/2018. FINDINGS: CT CHEST FINDINGS Cardiovascular: Heart is normal in size.  No pericardial effusion. No evidence of thoracic aortic aneurysm. Atherosclerotic calcifications of the aortic arch. Right chest port terminates the cavoatrial junction. Mediastinum/Nodes: No suspicious mediastinal lymphadenopathy. 8 mm left thyroid nodule. Lungs/Pleura: Paramediastinal radiation changes, right upper lung predominant. Additional radiation changes in the superior segment right upper lobe (  series 5/image 49). 8 x 11 mm thick-walled cavitary right lower lobe nodule (series 5/image 84), previously 7 x 9 mm. 8 mm left lower lobe nodule (series 5/image 86), previously 6 mm. Additional scattered small bilateral pulmonary nodules, many of which are subpleural, mildly progressive. No focal consolidation. No pleural effusion or pneumothorax. Musculoskeletal: Degenerative changes of the thoracic spine. CT ABDOMEN PELVIS FINDINGS Hepatobiliary: Progressive  hepatic metastases, including: --3.1 x 2.3 cm metastasis in segment 7 (series 4/image 40), previously 1.7 x 1.8 cm --2.5 x 2.7 cm metastasis in segment 4B (series 4/image 47), previously 1.3 cm Gallbladder is unremarkable. No intrahepatic or extrahepatic ductal dilatation. Pancreas: Within normal limits. Spleen: Within normal limits. Adrenals/Urinary Tract: 1.5 x 0.7 cm left adrenal metastasis (series 4/image 48), previously 12 mm. Right adrenal glands are within normal limits. Kidneys are within normal limits.  No hydronephrosis. Bladder is within normal limits. Stomach/Bowel: Stomach is within normal limits. No evidence of bowel obstruction. Normal appendix (series 4/image 80). Vascular/Lymphatic: No evidence of abdominal aortic aneurysm. Atherosclerotic calcifications of the abdominal aorta and branch vessels. No suspicious abdominopelvic lymphadenopathy. Reproductive: Status post hysterectomy. No adnexal masses. Other: No abdominopelvic ascites. Musculoskeletal: Visualized osseous structures are within normal limits. IMPRESSION: Progressive pulmonary metastases, including Janet dominant 11 mm right lower lobe nodule. Progressive hepatic metastases, with index lesions as above. Mildly progressive left adrenal metastasis. Known osseous metastases are poorly evaluated on CT. Electronically Signed   By: Julian Hy M.D.   On: 12/04/2018 14:09   Dg Chest Portable 1 View  Result Date: 12/19/2018 CLINICAL DATA:  Known lung cancer.  Dysphagia. EXAM: PORTABLE CHEST 1 VIEW COMPARISON:  CT scan Dec 14, 2018 FINDINGS: Janet left Port-Janet-Cath terminates near the caval atrial junction. Post therapeutic changes are seen in the right paramediastinal region. The pulmonary metastases seen on the comparison CT scan are not visualized on this study. No other interval changes. IMPRESSION: Post therapeutic changes in the paramediastinal right upper lobe. No other acute abnormalities. Electronically Signed   By: Dorise Bullion III  M.D   On: 12/19/2018 13:40    Assessment and Plan:   AYDIN [name] is Janet 63 y.o. y/o female with Janet history of stage IV lung cancer, on palliative care treatment.  Presented to the emergency room Janet few days back with dysphagia.  The patient states that the dysphagia has been for solids and liquids, for Janet few months gradually getting worse.  Suggest at the level of the Adam's apple that she is having difficulty swallowing.  Unclear from with the history over the phone whether it is Janet transfer dysphagia or abnormal esophageal dysphagia.  She undergone radiation in the past and this could be part of the reason causing difficulty swallowing.  Plan :   1. Obtain modified barium swallow along with esophagogram to determine if there is an issue with transfer dysphagia or luminal issue due to Janet stricture from radiation.  EGD in 2018 showed no abnormalities.  F/uy in 2-3 weeks     I discussed the assessment and treatment plan with the patient. The patient was provided an opportunity to ask questions and all were answered. The patient agreed with the plan and demonstrated an understanding of the instructions.   The patient was advised to call back or seek an in-person evaluation if the symptoms worsen or if the condition fails to improve as anticipated.  I provided 11 minutes of non-face-to-face time during this encounter.  Dr Janet Bellows MD,MRCP [facility]) Gastroenterology/Hepatology Pager: [phone]  Speech recognition software was used to dictate this note.

## 2018-12-22 ENCOUNTER — Telehealth: Payer: Self-pay | Admitting: *Deleted

## 2018-12-22 ENCOUNTER — Other Ambulatory Visit: Payer: Self-pay

## 2018-12-22 ENCOUNTER — Telehealth: Payer: Self-pay

## 2018-12-22 ENCOUNTER — Inpatient Hospital Stay: Payer: BC Managed Care – PPO

## 2018-12-22 VITALS — BP 121/72 | HR 72 | Temp 98.0°F | Resp 20

## 2018-12-22 DIAGNOSIS — N189 Chronic kidney disease, unspecified: Secondary | ICD-10-CM | POA: Diagnosis not present

## 2018-12-22 DIAGNOSIS — D649 Anemia, unspecified: Secondary | ICD-10-CM | POA: Diagnosis not present

## 2018-12-22 DIAGNOSIS — Z5111 Encounter for antineoplastic chemotherapy: Secondary | ICD-10-CM | POA: Diagnosis not present

## 2018-12-22 DIAGNOSIS — C787 Secondary malignant neoplasm of liver and intrahepatic bile duct: Secondary | ICD-10-CM | POA: Diagnosis not present

## 2018-12-22 DIAGNOSIS — C3411 Malignant neoplasm of upper lobe, right bronchus or lung: Secondary | ICD-10-CM | POA: Diagnosis not present

## 2018-12-22 DIAGNOSIS — R5381 Other malaise: Secondary | ICD-10-CM | POA: Diagnosis not present

## 2018-12-22 DIAGNOSIS — E785 Hyperlipidemia, unspecified: Secondary | ICD-10-CM | POA: Diagnosis not present

## 2018-12-22 DIAGNOSIS — C7951 Secondary malignant neoplasm of bone: Secondary | ICD-10-CM | POA: Diagnosis not present

## 2018-12-22 DIAGNOSIS — R1310 Dysphagia, unspecified: Secondary | ICD-10-CM | POA: Diagnosis not present

## 2018-12-22 DIAGNOSIS — Z5112 Encounter for antineoplastic immunotherapy: Secondary | ICD-10-CM | POA: Diagnosis not present

## 2018-12-22 DIAGNOSIS — Z7689 Persons encountering health services in other specified circumstances: Secondary | ICD-10-CM | POA: Diagnosis not present

## 2018-12-22 DIAGNOSIS — E876 Hypokalemia: Secondary | ICD-10-CM | POA: Diagnosis not present

## 2018-12-22 DIAGNOSIS — R5382 Chronic fatigue, unspecified: Secondary | ICD-10-CM | POA: Diagnosis not present

## 2018-12-22 DIAGNOSIS — Z923 Personal history of irradiation: Secondary | ICD-10-CM | POA: Diagnosis not present

## 2018-12-22 DIAGNOSIS — I129 Hypertensive chronic kidney disease with stage 1 through stage 4 chronic kidney disease, or unspecified chronic kidney disease: Secondary | ICD-10-CM | POA: Diagnosis not present

## 2018-12-22 DIAGNOSIS — R531 Weakness: Secondary | ICD-10-CM | POA: Diagnosis not present

## 2018-12-22 MED ORDER — SODIUM CHLORIDE 0.9% FLUSH
10.0000 mL | Freq: Once | INTRAVENOUS | Status: AC
  Administered 2018-12-22: 10 mL via INTRAVENOUS
  Filled 2018-12-22: qty 10

## 2018-12-22 MED ORDER — SODIUM CHLORIDE 0.9 % IV SOLN
40.0000 meq | Freq: Once | INTRAVENOUS | Status: AC
  Administered 2018-12-22: 40 meq via INTRAVENOUS
  Filled 2018-12-22: qty 20

## 2018-12-22 MED ORDER — HEPARIN SOD (PORK) LOCK FLUSH 100 UNIT/ML IV SOLN
500.0000 [IU] | Freq: Once | INTRAVENOUS | Status: AC
  Administered 2018-12-22: 500 [IU] via INTRAVENOUS
  Filled 2018-12-22: qty 5

## 2018-12-22 NOTE — Telephone Encounter (Signed)
Patient called asking why she has to have labs drawn again. (She had labs drawn by another provider 5/16). Her appointment is at 1015 today. Please advise  Magnesium  Order: [id number]  Status:  Final result Visible to patient:  No (Not Released) Next appt:  Today at 10:15 AM in Oncology ([facility])   Ref Range & Units 3d ago  Magnesium 1.7 - 2.4 mg/dL 1.3Low    Comment: Performed at [facility], [address] [place], [place] [address]  Resulting Agency  [facility] CLIN LAB      Specimen Collected: 12/19/18 12:27 Last Resulted: 12/19/18 14:24     Lab Flowsheet    Order Details    View Encounter    Lab and Collection Details    Routing    Result History          Other Results from 12/19/2018   Contains abnormal data CBC with Differential  Order: [id number]   Status:  Final result Visible to patient:  No (Not Released) Next appt:  Today at 10:15 AM in Oncology ([facility])   Ref Range & Units 3d ago 7d ago  WBC 4.0 - 10.5 K/uL 5.6  4.3   RBC 3.87 - 5.11 MIL/uL 2.80Low   2.90Low    Hemoglobin 12.0 - 15.0 g/dL 9.1Low   9.5Low    HCT 36.0 - 46.0 % 28.0Low   29.4Low    MCV 80.0 - 100.0 fL 100.0  101.4High    MCH 26.0 - 34.0 pg 32.5  32.8   MCHC 30.0 - 36.0 g/dL 32.5  32.3   RDW 11.5 - 15.5 % 13.9  14.0   Platelets 150 - 400 K/uL 105Low   233   Comment: Immature Platelet Fraction may be  clinically indicated, consider  ordering this additional test  [id number]   nRBC 0.0 - 0.2 % 0.0  0.0   Neutrophils Relative % % 72  74   Neutro Abs 1.7 - 7.7 K/uL 4.1  3.2   Lymphocytes Relative % 4  13   Lymphs Abs 0.7 - 4.0 K/uL 0.2Low   0.6Low    Monocytes Relative % 2  11   Monocytes Absolute 0.1 - 1.0 K/uL 0.1  0.5   Eosinophils Relative % 0  0   Eosinophils Absolute 0.0 - 0.5 K/uL 0.0  0.0   Basophils Relative % 3  1   Basophils Absolute 0.0 - 0.1 K/uL 0.2High   0.0   WBC Morphology  MILD LEFT SHIFT (1-5% METAS, OCC MYELO, OCC BANDS)    RBC Morphology  MORPHOLOGY  UNREMARKABLE    Smear Review  PLATELETS APPEAR ADEQUATE    Immature Granulocytes % 19  1   Abs Immature Granulocytes 0.00 - 0.07 K/uL 1.05High   0.02 CM  Comment: Performed at [facility], [address] [place], [place] [address]  Resulting Agency  [facility] CLIN LAB [facility] CLIN LAB      Specimen Collected: 12/19/18 12:27 Last Resulted: 12/19/18 12:56     Lab Flowsheet    Order Details    View Encounter    Lab and Collection Details    Routing    Result History      CM=Additional comments        Contains critical data Comprehensive metabolic panel  Order: [id number]   Status:  Final result Visible to patient:  No (Not Released) Next appt:  Today at 10:15 AM in Oncology ([facility])   Ref Range & Units 3d ago  7d ago  Sodium 135 - 145 mmol/L 139  140   Potassium 3.5 - 5.1 mmol/L 2.6Low Panic   2.6Low Panic  CM  Comment: CRITICAL RESULT CALLED TO, READ BACK BY AND VERIFIED WITH SUSAN NEAL AT 5573 12/19/2018.PMF  Chloride 98 - 111 mmol/L 100  100   CO2 22 - 32 mmol/L 28  30   Glucose, Bld 70 - 99 mg/dL 149High   110High    BUN 8 - 23 mg/dL 12  7Low    Creatinine, Ser 0.44 - 1.00 mg/dL 1.20High   1.20High    Calcium 8.9 - 10.3 mg/dL 7.8Low   8.5Low    Total Protein 6.5 - 8.1 g/dL 7.1  7.8   Albumin 3.5 - 5.0 g/dL 3.7  3.9   AST 15 - 41 U/L 34  28   ALT 0 - 44 U/L 16  11   Alkaline Phosphatase 38 - 126 U/L 112  87   Total Bilirubin 0.3 - 1.2 mg/dL 0.4  0.3   GFR calc non Af Amer >60 mL/min 48Low   48Low    GFR calc Af Amer >60 mL/min 56Low   56Low    Anion gap 5 - 15 11  10  CM  Comment: Performed at [facility], [address] [place], [place] [address]  Resulting Agency  [facility] CLIN LAB [facility] CLIN LAB      Specimen Collected: 12/19/18 12:27 Last Resulted: 12/19/18 13:09     Lab Flowsheet    Order Details    View Encounter    Lab and Collection Details    Routing    Result History      CM=Additional comments        Contains abnormal  data TSH  Order: [id number]   Status:  Final result Visible to patient:  No (Not Released) Next appt:  Today at 10:15 AM in Oncology ([facility])   Ref Range & Units 3d ago 36mo ago  TSH 0.350 - 4.500 uIU/mL 5.122High   3.060 R  Comment: Performed by a 3rd Generation assay with a functional sensitivity of <=0.01 uIU/mL.  Performed at [facility], [address] [place],  [address]   [place]  [facility] CLIN [place]      Specimen Collected: 12/19/18 12:27 Last Resulted: 12/19/18 13:09

## 2018-12-22 NOTE — Telephone Encounter (Signed)
Called pt to inform her of modified barium swallow appointment.  Unable to speak with pt, left message to return call

## 2018-12-22 NOTE — Telephone Encounter (Signed)
She does not need repeat labs, but would like her to have more IV potassium this week.  Thanks!

## 2018-12-22 NOTE — Telephone Encounter (Signed)
Patient has been scheduled for IV potassium today, in clinic now. Thanks.

## 2018-12-22 NOTE — Telephone Encounter (Signed)
Patient returned Jadijah's call °

## 2018-12-24 ENCOUNTER — Other Ambulatory Visit: Payer: Self-pay | Admitting: Unknown Physician Specialty

## 2018-12-24 NOTE — Telephone Encounter (Signed)
Spoke with pt and informed her of modified barium swallow appointment information.

## 2018-12-29 ENCOUNTER — Ambulatory Visit
Admission: RE | Admit: 2018-12-29 | Discharge: 2018-12-29 | Disposition: A | Discharge status: Home / Self Care | Payer: BLUE CROSS/BLUE SHIELD | Source: Ambulatory Visit | Attending: Gastroenterology | Admitting: Gastroenterology

## 2018-12-29 ENCOUNTER — Other Ambulatory Visit: Payer: Self-pay

## 2018-12-29 DIAGNOSIS — K219 Gastro-esophageal reflux disease without esophagitis: Secondary | ICD-10-CM | POA: Diagnosis not present

## 2018-12-29 DIAGNOSIS — R1310 Dysphagia, unspecified: Secondary | ICD-10-CM | POA: Diagnosis not present

## 2018-12-31 ENCOUNTER — Telehealth: Payer: Self-pay | Admitting: *Deleted

## 2018-12-31 ENCOUNTER — Other Ambulatory Visit: Payer: Self-pay

## 2018-12-31 ENCOUNTER — Inpatient Hospital Stay: Payer: BC Managed Care – PPO

## 2018-12-31 ENCOUNTER — Encounter: Payer: Self-pay | Admitting: Nurse Practitioner

## 2018-12-31 ENCOUNTER — Inpatient Hospital Stay (HOSPITAL_BASED_OUTPATIENT_CLINIC_OR_DEPARTMENT_OTHER): Payer: BLUE CROSS/BLUE SHIELD | Admitting: Nurse Practitioner

## 2018-12-31 VITALS — BP 115/73 | HR 94 | Temp 97.6°F | Resp 16 | Ht 60.0 in | Wt 96.9 lb

## 2018-12-31 DIAGNOSIS — Z923 Personal history of irradiation: Secondary | ICD-10-CM

## 2018-12-31 DIAGNOSIS — I129 Hypertensive chronic kidney disease with stage 1 through stage 4 chronic kidney disease, or unspecified chronic kidney disease: Secondary | ICD-10-CM | POA: Diagnosis not present

## 2018-12-31 DIAGNOSIS — R1319 Other dysphagia: Secondary | ICD-10-CM

## 2018-12-31 DIAGNOSIS — E876 Hypokalemia: Secondary | ICD-10-CM

## 2018-12-31 DIAGNOSIS — C3411 Malignant neoplasm of upper lobe, right bronchus or lung: Secondary | ICD-10-CM

## 2018-12-31 DIAGNOSIS — Z79899 Other long term (current) drug therapy: Secondary | ICD-10-CM

## 2018-12-31 DIAGNOSIS — C787 Secondary malignant neoplasm of liver and intrahepatic bile duct: Secondary | ICD-10-CM

## 2018-12-31 DIAGNOSIS — R1310 Dysphagia, unspecified: Secondary | ICD-10-CM | POA: Diagnosis not present

## 2018-12-31 DIAGNOSIS — R634 Abnormal weight loss: Secondary | ICD-10-CM

## 2018-12-31 DIAGNOSIS — N189 Chronic kidney disease, unspecified: Secondary | ICD-10-CM

## 2018-12-31 DIAGNOSIS — R531 Weakness: Secondary | ICD-10-CM

## 2018-12-31 DIAGNOSIS — E785 Hyperlipidemia, unspecified: Secondary | ICD-10-CM | POA: Diagnosis not present

## 2018-12-31 DIAGNOSIS — R5381 Other malaise: Secondary | ICD-10-CM | POA: Diagnosis not present

## 2018-12-31 DIAGNOSIS — T451X5A Adverse effect of antineoplastic and immunosuppressive drugs, initial encounter: Secondary | ICD-10-CM

## 2018-12-31 DIAGNOSIS — R5382 Chronic fatigue, unspecified: Secondary | ICD-10-CM | POA: Diagnosis not present

## 2018-12-31 DIAGNOSIS — Z7689 Persons encountering health services in other specified circumstances: Secondary | ICD-10-CM | POA: Diagnosis not present

## 2018-12-31 DIAGNOSIS — C7951 Secondary malignant neoplasm of bone: Secondary | ICD-10-CM | POA: Diagnosis not present

## 2018-12-31 DIAGNOSIS — D649 Anemia, unspecified: Secondary | ICD-10-CM

## 2018-12-31 DIAGNOSIS — K1379 Other lesions of oral mucosa: Secondary | ICD-10-CM

## 2018-12-31 DIAGNOSIS — Z5189 Encounter for other specified aftercare: Secondary | ICD-10-CM

## 2018-12-31 DIAGNOSIS — Z803 Family history of malignant neoplasm of breast: Secondary | ICD-10-CM

## 2018-12-31 DIAGNOSIS — Z87891 Personal history of nicotine dependence: Secondary | ICD-10-CM

## 2018-12-31 DIAGNOSIS — K117 Disturbances of salivary secretion: Secondary | ICD-10-CM

## 2018-12-31 DIAGNOSIS — R0989 Other specified symptoms and signs involving the circulatory and respiratory systems: Secondary | ICD-10-CM | POA: Insufficient documentation

## 2018-12-31 DIAGNOSIS — Z5111 Encounter for antineoplastic chemotherapy: Secondary | ICD-10-CM | POA: Diagnosis not present

## 2018-12-31 DIAGNOSIS — M1990 Unspecified osteoarthritis, unspecified site: Secondary | ICD-10-CM

## 2018-12-31 DIAGNOSIS — R09A2 Foreign body sensation, throat: Secondary | ICD-10-CM

## 2018-12-31 DIAGNOSIS — E039 Hypothyroidism, unspecified: Secondary | ICD-10-CM

## 2018-12-31 DIAGNOSIS — Z5112 Encounter for antineoplastic immunotherapy: Secondary | ICD-10-CM | POA: Diagnosis not present

## 2018-12-31 DIAGNOSIS — Z95828 Presence of other vascular implants and grafts: Secondary | ICD-10-CM

## 2018-12-31 LAB — COMPREHENSIVE METABOLIC PANEL
ALT: 11 U/L (ref 0–44)
AST: 28 U/L (ref 15–41)
Albumin: 3.8 g/dL (ref 3.5–5.0)
Alkaline Phosphatase: 93 U/L (ref 38–126)
Anion gap: 15 (ref 5–15)
BUN: 15 mg/dL (ref 8–23)
CO2: 27 mmol/L (ref 22–32)
Calcium: 7.4 mg/dL — ABNORMAL LOW (ref 8.9–10.3)
Chloride: 93 mmol/L — ABNORMAL LOW (ref 98–111)
Creatinine, Ser: 1.63 mg/dL — ABNORMAL HIGH (ref 0.44–1.00)
GFR calc Af Amer: 38 mL/min — ABNORMAL LOW (ref >60)
GFR calc non Af Amer: 33 mL/min — ABNORMAL LOW (ref >60)
Glucose, Bld: 118 mg/dL — ABNORMAL HIGH (ref 70–99)
Potassium: 2.1 mmol/L — CL (ref 3.5–5.1)
Sodium: 135 mmol/L (ref 135–145)
Total Bilirubin: 0.6 mg/dL (ref 0.3–1.2)
Total Protein: 7.9 g/dL (ref 6.5–8.1)

## 2018-12-31 LAB — CBC WITH DIFFERENTIAL/PLATELET
Abs Immature Granulocytes: 0.79 10*3/uL — ABNORMAL HIGH (ref 0.00–0.07)
Basophils Absolute: 0 10*3/uL (ref 0.0–0.1)
Basophils Relative: 0 %
Eosinophils Absolute: 0 10*3/uL (ref 0.0–0.5)
Eosinophils Relative: 0 %
HCT: 29.3 % — ABNORMAL LOW (ref 36.0–46.0)
Hemoglobin: 9.7 g/dL — ABNORMAL LOW (ref 12.0–15.0)
Immature Granulocytes: 8 %
Lymphocytes Relative: 10 %
Lymphs Abs: 1.1 10*3/uL (ref 0.7–4.0)
MCH: 31.9 pg (ref 26.0–34.0)
MCHC: 33.1 g/dL (ref 30.0–36.0)
MCV: 96.4 fL (ref 80.0–100.0)
Monocytes Absolute: 0.8 10*3/uL (ref 0.1–1.0)
Monocytes Relative: 7 %
Neutro Abs: 7.8 10*3/uL — ABNORMAL HIGH (ref 1.7–7.7)
Neutrophils Relative %: 75 %
Platelets: 137 10*3/uL — ABNORMAL LOW (ref 150–400)
RBC: 3.04 MIL/uL — ABNORMAL LOW (ref 3.87–5.11)
RDW: 14.2 % (ref 11.5–15.5)
WBC: 10.4 10*3/uL (ref 4.0–10.5)
nRBC: 0.5 % — ABNORMAL HIGH (ref 0.0–0.2)

## 2018-12-31 MED ORDER — PANTOPRAZOLE SODIUM 40 MG PO TBEC: 40.0000 mg | DELAYED_RELEASE_TABLET | Freq: Every day | ORAL | 0 refills | Status: AC

## 2018-12-31 MED ORDER — POTASSIUM CHLORIDE 20 MEQ/15ML (10%) PO SOLN: 20.0000 meq | Freq: Two times a day (BID) | ORAL | 0 refills | Status: AC

## 2018-12-31 MED ORDER — SODIUM CHLORIDE 0.9% FLUSH
10.0000 mL | Freq: Once | INTRAVENOUS | Status: AC
  Administered 2018-12-31: 10 mL via INTRAVENOUS
  Filled 2018-12-31: qty 10

## 2018-12-31 MED ORDER — POTASSIUM CHLORIDE 10 MEQ/100ML IV SOLN: 10.0000 meq | INTRAVENOUS | Status: DC

## 2018-12-31 MED ORDER — SODIUM CHLORIDE 0.9 % IV SOLN
Freq: Once | INTRAVENOUS | Status: AC
  Administered 2018-12-31: 13:00:00 via INTRAVENOUS
  Filled 2018-12-31: qty 250

## 2018-12-31 MED ORDER — SODIUM CHLORIDE 0.9 % IV SOLN
INTRAVENOUS | Status: DC
  Administered 2018-12-31: 13:00:00 via INTRAVENOUS
  Filled 2018-12-31 (×2): qty 250

## 2018-12-31 MED ORDER — MAGIC MOUTHWASH W/LIDOCAINE: 5.0000 mL | Freq: Four times a day (QID) | ORAL | 3 refills | Status: DC | PRN

## 2018-12-31 MED ORDER — HEPARIN SOD (PORK) LOCK FLUSH 100 UNIT/ML IV SOLN
500.0000 [IU] | Freq: Once | INTRAVENOUS | Status: AC
  Administered 2018-12-31: 500 [IU]
  Filled 2018-12-31: qty 5

## 2018-12-31 NOTE — Telephone Encounter (Signed)
Patient called reporting that she has several issues going on and that she went to Hospital 2 weeks ago and was given IV Potassium and that she was having trouble swallowing for which she is seeing Dr Vicente Males. She states she is now drooling . I advised that she contact Dr Georgeann Oppenheim office about this and if he feels we need to see her, I will get her in with someone today. She reports he did a swallowing study on her Tursday and she will call his office.

## 2018-12-31 NOTE — Telephone Encounter (Signed)
I called patient and she said she was going to go to ER, but agreed to come to [facility] and did not want to wait until after lunch to do so. Ptt for 1115 lab, 1130 NP accepted

## 2018-12-31 NOTE — Telephone Encounter (Signed)
Lets just have her see [facility] with lab today.  Thanks!

## 2018-12-31 NOTE — Telephone Encounter (Signed)
Left message on patient voice mail that doctor wants her to come in to see NP and have labs drawn. I asked she return my call

## 2018-12-31 NOTE — Addendum Note (Signed)
Addended by: Jarrett Soho C on: 12/31/2018 11:11 AM   Modules accepted: Orders

## 2018-12-31 NOTE — Progress Notes (Signed)
Symptom Management [place]  [phone] [phone]) [phone]  Patient Care Team: Volney American, PA-C as PCP - General (Family Medicine) [name], RN as Registered Nurse   Name of the patient: Janet [name]  [id number]  1955/10/28   Date of visit: 12/31/18  Diagnosis-stage IV lung cancer  Chief complaint/ Reason for visit-difficulty swallowing  Heme/Onc history:    Cancer of upper lobe of right lung ([place])   07/09/2017 Initial Diagnosis    Cancer of upper lobe of right lung ([place])    04/01/2018 - 12/07/2018 Chemotherapy    The patient had palonosetron (ALOXI) injection 0.25 mg, 0.25 mg, Intravenous,  Once, 12 of 18 cycles Administration: 0.25 mg (04/01/2018), 0.25 mg (04/22/2018), 0.25 mg (05/13/2018), 0.25 mg (06/03/2018), 0.25 mg (06/23/2018), 0.25 mg (07/14/2018), 0.25 mg (08/04/2018), 0.25 mg (08/25/2018), 0.25 mg (09/15/2018), 0.25 mg (10/06/2018), 0.25 mg (10/27/2018), 0.25 mg (11/17/2018) bevacizumab (AVASTIN) 900 mg in sodium chloride 0.9 % 100 mL chemo infusion, 925 mg, Intravenous,  Once, 12 of 18 cycles Administration: 900 mg (04/01/2018), 900 mg (04/22/2018), 800 mg (06/03/2018), 800 mg (06/23/2018), 800 mg (07/14/2018), 800 mg (08/04/2018), 800 mg (08/25/2018), 800 mg (09/15/2018), 800 mg (10/06/2018), 800 mg (10/27/2018), 800 mg (11/17/2018) PEMEtrexed (ALIMTA) 800 mg in sodium chloride 0.9 % 100 mL chemo infusion, 800 mg, Intravenous,  Once, 12 of 18 cycles Administration: 800 mg (04/01/2018), 800 mg (04/22/2018), 800 mg (05/13/2018), 800 mg (06/03/2018), 800 mg (06/23/2018), 800 mg (07/14/2018), 800 mg (08/04/2018), 800 mg (08/25/2018), 800 mg (09/15/2018), 800 mg (10/06/2018), 800 mg (10/27/2018), 800 mg (11/17/2018) CARBOplatin (PARAPLATIN) 480 mg in sodium chloride 0.9 % 250 mL chemo infusion, 480 mg (100 % of original dose 480.5 mg), Intravenous,  Once, 8 of 8 cycles Dose modification:   (original dose 480.5 mg, Cycle 1) Administration:  480 mg (04/01/2018), 480 mg (04/22/2018), 480 mg (05/13/2018), 400 mg (06/03/2018), 440 mg (06/23/2018), 440 mg (07/14/2018), 440 mg (08/04/2018), 430 mg (08/25/2018) fosaprepitant (EMEND) 150 mg, dexamethasone (DECADRON) 12 mg in sodium chloride 0.9 % 145 mL IVPB, , Intravenous,  Once, 12 of 18 cycles Administration:  (04/01/2018),  (04/22/2018),  (05/13/2018),  (06/03/2018),  (06/23/2018),  (07/14/2018),  (08/04/2018),  (08/25/2018),  (09/15/2018),  (10/06/2018),  (10/27/2018),  (11/17/2018)  for chemotherapy treatment.     12/15/2018 -  Chemotherapy    The patient had pegfilgrastim (NEULASTA ONPRO KIT) injection 6 mg, 6 mg, Subcutaneous, Once, 1 of 6 cycles Administration: 6 mg (12/15/2018) DOCEtaxel (TAXOTERE) 110 mg in sodium chloride 0.9 % 250 mL chemo infusion, 75 mg/m2 = 110 mg, Intravenous,  Once, 1 of 6 cycles Administration: 110 mg (12/15/2018) ramucirumab (CYRAMZA) 500 mg in sodium chloride 0.9 % 200 mL chemo infusion, 10 mg/kg = 500 mg, Intravenous, Once, 1 of 6 cycles Administration: 500 mg (12/15/2018)  for chemotherapy treatment.      Interval history- Janet [name], 63 year old female diagnosed with stage IV lung cancer who presents to symptom management clinic for globus sensation & dysphagia.   She initially presented to ER on 12/19/2018 reporting sensation of food getting stuck x2 days.  Was tolerating liquids at that time.  Imaging was negative for acute process and she received electrolyte replacement.  Prior to discharge, she was able to tolerate crackers and juice.  She was referred to GI and saw Dr. Vicente Males on 12/21/2018.  Modified barium swallow with esophagram was performed on 12/29/2018 and showed minimal gastroesophageal reflux and otherwise unremarkable.  Barium tablet was not administered due to difficulty swallowing.  She previously had upper endoscopy in October 2018 for similar complaints of dysphagia.  Endoscopy was negative at that time.    Today, she continues to report globus  sensation, dysphasia, odynophagia. States symptoms started couple weeks ago and have continued since that time, not worsening or improving. She states feeling that at level of Adam's apple, feeling that food is stuck.  This occurs primarily after eating solid foods and taking medications.  Says often food feels like it gets "hung" in her throat and she has to swallow several times to get it to pass.  She also complains of mouth sores, painful gums that have caused her not to be able to wear her dentures, and spitting up saliva.  This spitting of saliva occurs intermittently throughout the day.  No coughing.  She denies tongue weakness.  She does have difficulty chewing food but says this is primarily due to not wearing her dentures and mouth sores.  She denies emesis, bilious reflux, regurgitation of undigested food.  Denies heartburn.  She suspects her potassium level is low today and has not been taking her potassium tablets due to their large size and difficulty swallowing.  ECOG FS:1 - Symptomatic but completely ambulatory  Review of systems- Review of Systems  Constitutional: Positive for malaise/fatigue and weight loss. Negative for chills, diaphoresis and fever.  HENT: Negative for congestion, ear pain, hearing loss, sinus pain and sore throat.   Eyes: Negative for pain and redness.  Respiratory: Negative for cough, hemoptysis, sputum production, shortness of breath and wheezing.   Cardiovascular: Negative for chest pain, palpitations and leg swelling.  Gastrointestinal: Negative for abdominal pain, heartburn, nausea and vomiting.  Genitourinary: Negative.   Musculoskeletal: Negative for falls and myalgias.  Skin: Negative for itching and rash.  Neurological: Positive for speech change (hoarseness but none currently). Negative for dizziness, weakness and headaches.  Endo/Heme/Allergies: Negative for environmental allergies.  Psychiatric/Behavioral: Negative for depression. The patient is not  nervous/anxious.      Current treatment-Taxotere-Cyramza with zometa s/p cycle 1 12/15/2018  No Known Allergies  Past Medical History:  Diagnosis Date   Arthritis    right shoulder   Cancer of right lung ([place]) 08/2017   Chemo + rad tx's.    Hyperlipidemia    Hypertension    Hypothyroidism    Menopausal state    Personal history of chemotherapy 2019   lung ca   Personal history of radiation therapy 2019   lung ca   Thyroid disease     Past Surgical History:  Procedure Laterality Date   ABDOMINAL HYSTERECTOMY  2005   BREAST EXCISIONAL BIOPSY Right 1979   benign   COLONOSCOPY WITH PROPOFOL N/A 05/13/2017   Procedure: COLONOSCOPY WITH PROPOFOL;  Surgeon: [name], MD;  Location: [facility] ENDOSCOPY;  Service: Gastroenterology;  Laterality: N/A;   CYSTECTOMY Right    breast   CYSTECTOMY  02/2015   back of neck   ESOPHAGOGASTRODUODENOSCOPY (EGD) WITH PROPOFOL N/A 05/13/2017   Procedure: ESOPHAGOGASTRODUODENOSCOPY (EGD) WITH PROPOFOL;  Surgeon: [name], MD;  Location[facility] ENDOSCOPY;  Service: Gastroenterology;  Laterality: N/A;   PORTA CATH INSERTION N/A 09/01/2017   Procedure: PORTA CATH INSERTION;  Surgeon: Algernon Huxley, MD;  Location: [facility] CV LAB;  Service: Cardiovascular;  Laterality: N/A;   TUBAL LIGATION  1986    Social History   Socioeconomic History   Marital status: Married    Spouse name: Not on file   Number of children: Not on file   Years  of education: Not on file   Highest education level: Not on file  Occupational History   Not on file  Social Needs   Financial resource strain: Not on file   Food insecurity:    Worry: Not on file    Inability: Not on file   Transportation needs:    Medical: Not on file    Non-medical: Not on file  Tobacco Use   Smoking status: Former Smoker    Packs/day: 0.25    Types: Cigarettes    Last attempt to quit: 02/01/2017    Years since quitting: 1.9   Smokeless tobacco: Never Used    Substance and Sexual Activity   Alcohol use: No    Alcohol/week: 0.0 standard drinks   Drug use: No   Sexual activity: Not Currently  Lifestyle   Physical activity:    Days per week: Not on file    Minutes per session: Not on file   Stress: Not on file  Relationships   Social connections:    Talks on phone: Not on file    Gets together: Not on file    Attends religious service: Not on file    Active member of club or organization: Not on file    Attends meetings of clubs or organizations: Not on file    Relationship status: Not on file   Intimate partner violence:    Fear of current or ex partner: Not on file    Emotionally abused: Not on file    Physically abused: Not on file    Forced sexual activity: Not on file  Other Topics Concern   Not on file  Social History Narrative   Not on file    Family History  Problem Relation Age of Onset   Hypertension Mother    Stroke Mother    Leukemia Mother    Stroke Father    Pneumonia Father    Diabetes Sister    Hyperlipidemia Sister    Breast cancer Maternal Aunt 8     Current Outpatient Medications:    amLODipine (NORVASC) 5 MG tablet, TAKE 1 TABLET BY MOUTH DAILY (Patient taking differently: Take 5 mg by mouth daily. ), Disp: 30 tablet, Rfl: 0   atorvastatin (LIPITOR) 10 MG tablet, TAKE 1 TABLET DAILY AT 6 P.M. (Patient taking differently: Take 10 mg by mouth daily at 6 PM. ), Disp: 10 tablet, Rfl: 0   DULoxetine (CYMBALTA) 30 MG capsule, Take 1 capsule (30 mg total) by mouth daily., Disp: 30 capsule, Rfl: 3   folic acid (FOLVITE) 1 MG tablet, Take 1 tablet (1 mg total) by mouth daily., Disp: 90 tablet, Rfl: 4   levothyroxine (SYNTHROID) 50 MCG tablet, TAKE 1 TABLET DAILY, Disp: 90 tablet, Rfl: 0   lidocaine-prilocaine (EMLA) cream, Apply 1 application topically daily as needed (access). , Disp: , Rfl: 3   magic mouthwash w/lidocaine SOLN, Take 5-10 mLs by mouth 4 (four) times daily as needed  (mouth sores & pain)., Disp: 480 mL, Rfl: 3   Oxycodone HCl 10 MG TABS, Take 1 tablet (10 mg total) by mouth every 6 (six) hours as needed. (Patient not taking: Reported on 12/31/2018), Disp: 120 tablet, Rfl: 0   pantoprazole (PROTONIX) 40 MG tablet, Take 1 tablet (40 mg total) by mouth daily., Disp: 30 tablet, Rfl: 0   potassium chloride 20 MEQ/15ML (10%) SOLN, Take 15 mLs (20 mEq total) by mouth 2 (two) times daily., Disp: 473 mL, Rfl: 0 No current facility-administered medications for  this visit.   Facility-Administered Medications Ordered in Other Visits:    0.9 %  sodium chloride infusion, , Intravenous, Continuous, Verlon Au, NP, Last Rate: 500 mL/hr at 12/31/18 1246   heparin lock flush 100 unit/mL, 500 Units, Intracatheter, Once, Verlon Au, NP  Physical exam:  Vitals:   12/31/18 1141  BP: 115/73  Pulse: 94  Resp: 16  Temp: 97.6 F (36.4 C)  TempSrc: Tympanic  Weight: 96 lb 14.4 oz (44 kg)  Height: 5' (1.524 m)   Physical Exam Constitutional:      General: She is not in acute distress.    Comments: Thin built/cachexic female in exam room; unaccompanied  HENT:     Head: Normocephalic and atraumatic.     Nose: No congestion or rhinorrhea.     Mouth/Throat:     Pharynx: Posterior oropharyngeal erythema (redness & mouth sores across gums, worse on lower and back of lips) present.     Comments: Managing secretions well but at times spits up saliva into bag. Tolerating liquids and solids w/o difficulty or coughing; teeth absent Eyes:     Conjunctiva/sclera: Conjunctivae normal.  Neck:     Musculoskeletal: Neck supple. No neck rigidity or muscular tenderness.  Cardiovascular:     Rate and Rhythm: Normal rate and regular rhythm.  Pulmonary:     Comments: decreased Lymphadenopathy:     Cervical: No cervical adenopathy.  Skin:    General: Skin is warm and dry.  Neurological:     Mental Status: She is oriented to person, place, and time.  Psychiatric:         Mood and Affect: Mood normal.        Behavior: Behavior normal.      CMP Latest Ref Rng & Units 12/31/2018  Glucose 70 - 99 mg/dL 118(H)  BUN 8 - 23 mg/dL 15  Creatinine 0.44 - 1.00 mg/dL 1.63(H)  Sodium 135 - 145 mmol/L 135  Potassium 3.5 - 5.1 mmol/L 2.1(LL)  Chloride 98 - 111 mmol/L 93(L)  CO2 22 - 32 mmol/L 27  Calcium 8.9 - 10.3 mg/dL 7.4(L)  Total Protein 6.5 - 8.1 g/dL 7.9  Total Bilirubin 0.3 - 1.2 mg/dL 0.6  Alkaline Phos 38 - 126 U/L 93  AST 15 - 41 U/L 28  ALT 0 - 44 U/L 11   CBC Latest Ref Rng & Units 12/31/2018  WBC 4.0 - 10.5 K/uL 10.4  Hemoglobin 12.0 - 15.0 g/dL 9.7(L)  Hematocrit 36.0 - 46.0 % 29.3(L)  Platelets 150 - 400 K/uL 137(L)    No images are attached to the encounter.  Ct Soft Tissue Neck W Contrast  Result Date: 12/19/2018 CLINICAL DATA:  Difficulty swallowing. Stage IV lung cancer. Dysphagia. EXAM: CT NECK WITH CONTRAST TECHNIQUE: Multidetector CT imaging of the neck was performed using the standard protocol following the bolus administration of intravenous contrast. CONTRAST:  51m OMNIPAQUE IOHEXOL 300 MG/ML  SOLN COMPARISON:  CT of the neck 11/21/2014 FINDINGS: Pharynx and larynx: No focal mucosal or submucosal lesions are present. 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 Oropharynx is within normal limits. Epiglottis is normal. Vocal cords are midline and symmetric. Trachea is normal. Salivary glands: Submandibular and parotid glands are within normal limits bilaterally. Thyroid: 8 mm nodule in the left lobe of the thyroid is stable. Lymph nodes: No significant cervical adenopathy is present. Vascular: Atherosclerotic calcifications are present within the carotid There is no significant arteries stenosis relative to the more distal vessels.  Limited intracranial: Within normal limits. Visualized orbits: The globes and orbits are within normal limits. Mastoids and visualized paranasal sinuses: The paranasal sinuses and mastoid  air cells are clear. Skeleton: Previously noted exostosis adjacent to the left mastoid has been resected. No focal lytic or blastic lesions are present. Upper chest: Post radiation changes in the right upper lobe are stable. No new lesions are present. Extensive atherosclerotic changes are noted at the aortic arch. Left IJ Port-A-Cath is in place. Other: There is generalized loss of subcutaneous fat. IMPRESSION: 1. No evidence for metastatic disease to the neck or other focal etiology to explain dysphagia. 2. Generalized cachexia. 3. Post treatment changes in the right upper lobe. Electronically Signed   By: San Morelle M.D.   On: 12/19/2018 14:02   Ct Chest W Contrast  Result Date: 12/04/2018 CLINICAL DATA:  Right upper lobe lung cancer, status post chemotherapy and radiation EXAM: CT CHEST, ABDOMEN, AND PELVIS WITH CONTRAST TECHNIQUE: Multidetector CT imaging of the chest, abdomen and pelvis was performed following the standard protocol during bolus administration of intravenous contrast. CONTRAST:  6m OMNIPAQUE IOHEXOL 300 MG/ML  SOLN COMPARISON:  CT chest abdomen pelvis dated 07/08/2018. PET-CT dated 03/20/2018. FINDINGS: CT CHEST FINDINGS Cardiovascular: Heart is normal in size.  No pericardial effusion. No evidence of thoracic aortic aneurysm. Atherosclerotic calcifications of the aortic arch. Right chest port terminates the cavoatrial junction. Mediastinum/Nodes: No suspicious mediastinal lymphadenopathy. 8 mm left thyroid nodule. Lungs/Pleura: Paramediastinal radiation changes, right upper lung predominant. Additional radiation changes in the superior segment right upper lobe (series 5/image 49). 8 x 11 mm thick-walled cavitary right lower lobe nodule (series 5/image 84), previously 7 x 9 mm. 8 mm left lower lobe nodule (series 5/image 86), previously 6 mm. Additional scattered small bilateral pulmonary nodules, many of which are subpleural, mildly progressive. No focal consolidation. No  pleural effusion or pneumothorax. Musculoskeletal: Degenerative changes of the thoracic spine. CT ABDOMEN PELVIS FINDINGS Hepatobiliary: Progressive hepatic metastases, including: --3.1 x 2.3 cm metastasis in segment 7 (series 4/image 40), previously 1.7 x 1.8 cm --2.5 x 2.7 cm metastasis in segment 4B (series 4/image 47), previously 1.3 cm Gallbladder is unremarkable. No intrahepatic or extrahepatic ductal dilatation. Pancreas: Within normal limits. Spleen: Within normal limits. Adrenals/Urinary Tract: 1.5 x 0.7 cm left adrenal metastasis (series 4/image 48), previously 12 mm. Right adrenal glands are within normal limits. Kidneys are within normal limits.  No hydronephrosis. Bladder is within normal limits. Stomach/Bowel: Stomach is within normal limits. No evidence of bowel obstruction. Normal appendix (series 4/image 80). Vascular/Lymphatic: No evidence of abdominal aortic aneurysm. Atherosclerotic calcifications of the abdominal aorta and branch vessels. No suspicious abdominopelvic lymphadenopathy. Reproductive: Status post hysterectomy. No adnexal masses. Other: No abdominopelvic ascites. Musculoskeletal: Visualized osseous structures are within normal limits. IMPRESSION: Progressive pulmonary metastases, including a dominant 11 mm right lower lobe nodule. Progressive hepatic metastases, with index lesions as above. Mildly progressive left adrenal metastasis. Known osseous metastases are poorly evaluated on CT. Electronically Signed   By: SJulian HyM.D.   On: 12/04/2018 14:09   Nm Bone Scan Whole Body  Result Date: 12/03/2018 CLINICAL DATA:  Cancer of upper lobe of right lung. EXAM: NUCLEAR MEDICINE WHOLE BODY BONE SCAN TECHNIQUE: Whole body anterior and posterior images were obtained approximately 3 hours after intravenous injection of radiopharmaceutical. RADIOPHARMACEUTICALS:  21.65 mCi Technetium-953mDP IV COMPARISON:  Bone scan and CT scans of July 08, 2018. PET scan of March 20, 2018.  FINDINGS: Stable focus of abnormal uptake is  seen involving the left acromion consistent with metastatic disease. Stable abnormal uptake is seen involving left iliac bone anteriorly consistent with metastatic disease. Stable focus of abnormal uptake is seen involving the posterior portion of the left iliac bone also consistent with metastatic disease. Abnormal uptake is seen involving the anterior portion of right lower rib which is slightly improved compared to prior exam. Abnormal uptake is noted in both feet consistent with degenerative change. Mild focus of abnormal uptake is seen involving the lower sternum concerning for metastatic disease. Abnormal uptake is also seen involving the left inferior pubic ramus which is unchanged compared to prior exam. No new areas of abnormal uptake are noted. IMPRESSION: Continued presence of abnormal foci of abnormal uptake is seen involving the left inferior pubic ramus, left acromion, left iliac bone, lower sternum and right lower rib consistent with metastatic disease. No new areas of abnormal uptake are noted. Electronically Signed   By: Marijo Conception M.D.   On: 12/03/2018 14:58   Ct Abdomen Pelvis W Contrast  Result Date: 12/04/2018 CLINICAL DATA:  Right upper lobe lung cancer, status post chemotherapy and radiation EXAM: CT CHEST, ABDOMEN, AND PELVIS WITH CONTRAST TECHNIQUE: Multidetector CT imaging of the chest, abdomen and pelvis was performed following the standard protocol during bolus administration of intravenous contrast. CONTRAST:  35m OMNIPAQUE IOHEXOL 300 MG/ML  SOLN COMPARISON:  CT chest abdomen pelvis dated 07/08/2018. PET-CT dated 03/20/2018. FINDINGS: CT CHEST FINDINGS Cardiovascular: Heart is normal in size.  No pericardial effusion. No evidence of thoracic aortic aneurysm. Atherosclerotic calcifications of the aortic arch. Right chest port terminates the cavoatrial junction. Mediastinum/Nodes: No suspicious mediastinal lymphadenopathy. 8 mm left  thyroid nodule. Lungs/Pleura: Paramediastinal radiation changes, right upper lung predominant. Additional radiation changes in the superior segment right upper lobe (series 5/image 49). 8 x 11 mm thick-walled cavitary right lower lobe nodule (series 5/image 84), previously 7 x 9 mm. 8 mm left lower lobe nodule (series 5/image 86), previously 6 mm. Additional scattered small bilateral pulmonary nodules, many of which are subpleural, mildly progressive. No focal consolidation. No pleural effusion or pneumothorax. Musculoskeletal: Degenerative changes of the thoracic spine. CT ABDOMEN PELVIS FINDINGS Hepatobiliary: Progressive hepatic metastases, including: --3.1 x 2.3 cm metastasis in segment 7 (series 4/image 40), previously 1.7 x 1.8 cm --2.5 x 2.7 cm metastasis in segment 4B (series 4/image 47), previously 1.3 cm Gallbladder is unremarkable. No intrahepatic or extrahepatic ductal dilatation. Pancreas: Within normal limits. Spleen: Within normal limits. Adrenals/Urinary Tract: 1.5 x 0.7 cm left adrenal metastasis (series 4/image 48), previously 12 mm. Right adrenal glands are within normal limits. Kidneys are within normal limits.  No hydronephrosis. Bladder is within normal limits. Stomach/Bowel: Stomach is within normal limits. No evidence of bowel obstruction. Normal appendix (series 4/image 80). Vascular/Lymphatic: No evidence of abdominal aortic aneurysm. Atherosclerotic calcifications of the abdominal aorta and branch vessels. No suspicious abdominopelvic lymphadenopathy. Reproductive: Status post hysterectomy. No adnexal masses. Other: No abdominopelvic ascites. Musculoskeletal: Visualized osseous structures are within normal limits. IMPRESSION: Progressive pulmonary metastases, including a dominant 11 mm right lower lobe nodule. Progressive hepatic metastases, with index lesions as above. Mildly progressive left adrenal metastasis. Known osseous metastases are poorly evaluated on CT. Electronically Signed    By: SJulian HyM.D.   On: 12/04/2018 14:09   Dg Chest Portable 1 View  Result Date: 12/19/2018 CLINICAL DATA:  Known lung cancer.  Dysphagia. EXAM: PORTABLE CHEST 1 VIEW COMPARISON:  CT scan Dec 14, 2018 FINDINGS: A left Port-A-Cath terminates near  the caval atrial junction. Post therapeutic changes are seen in the right paramediastinal region. The pulmonary metastases seen on the comparison CT scan are not visualized on this study. No other interval changes. IMPRESSION: Post therapeutic changes in the paramediastinal right upper lobe. No other acute abnormalities. Electronically Signed   By: Dorise Bullion III M.D   On: 12/19/2018 13:40   Dg Esophagus W Double Cm (hd)  Result Date: 12/29/2018 CLINICAL DATA:  Difficulty swallowing. EXAM: ESOPHOGRAM/BARIUM SWALLOW TECHNIQUE: Combined double contrast and single contrast examination performed using effervescent crystals, thick barium liquid, and thin barium liquid. FLUOROSCOPY TIME:  Fluoroscopy Time:  2 minutes 12 seconds Radiation Exposure Index (if provided by the fluoroscopic device): 42.5 mGy Number of Acquired Spot Images: 48 COMPARISON:  Chest CT 12/04/2018. FINDINGS: Cervical esophagus is widely patent. Tiny anterior cervical web noted. Thoracic esophagus is widely patent pliable. No obstructing lesion noted. Peristalsis was normal. Minimal reflux noted. A barium tablet was not administered due to the patient's difficulty with swallowing. IMPRESSION: Minimal gastroesophageal reflux noted.  Exam otherwise unremarkable. Electronically Signed   By: Marcello Moores  Register   On: 12/29/2018 11:16    Assessment and plan- Patient is a 63 y.o. female diagnosed with progressive stage IV adenocarcinoma of the lung who presents to symptom management clinic for dysphagia.   1.  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  12/04/2018 imaging showed mild progression of pulmonary and adrenal  mets with significant progression of liver mets.  Patient wished to continue with palliative treatment & initiated Taxotere-Cyramza with neulasta support on 12/15/2018.   2. Globus Sensation/Dysphagia- etiology unclear. Barium swallow unremarkable without evidence of obstruction which is reassuring. Discussed with Dr. Grayland Ormond who recommends brain mri to evaluate for brain mets in setting of progressive lung cancer. Also discussed with Dr. Vicente Males who agrees with MRI and recommends PPI (see below). If persistent dysphagia and negative MRI, Dr. Vicente Males will consider EGD.  3. Mouth sores- likely secondary to chemotherapy. Prescription sent to pharmacy for magic mouthwash with nystatin & lidocaine.   4. Medication Compliance- given her difficulty swallowing she has not been able to tolerate many of her medications which has reduced compliance. I spoke to pharmacy who did not have liquid ppi on formulary and they are not able to compound but we did discuss that based on tablet size I selected pantoprazole daily. Encouraged patient to take 30-60 minutes before meals and use applesauce to help swallow tablet whole. We reviewed her medications today and discussed which medications could be crushed vs need to be taken whole.   5. Malnutrition- encouraged oral intake using liquids and soft foods. Discussed options for softening foods such as blending or mashing. Encouraged consumption of Ensure - ~ 3/day to maintain calorie intake. Consider referral to dietitian.  6. Hypokalemia- Potassium critically low today at 2.1 which is likely secondary to poor oral intake and intolerance to supplementation. I changed her potassium to a liquid formulation which she may tolerate better.   7. AKI- kidney function slightly worse today. Cr 1.63. Likely secondary to loss of fluid and minimal oral intake. IV fluids in clinic today.   8. Goals of Care- given general decline and symptoms I will refer her back to palliative care to  follow up with her.   MRI scheduled for Monday and she will follow up with Dr. Grayland Ormond for labs & results on Tuesday. She has follow up scheduled with Dr. Vicente Males  on 6/4. Follow up with palliative care based on results.    Visit Diagnosis 1. Globus sensation   2. Cancer of upper lobe of right lung ([place])   3. Dysphagia, unspecified type   4. Mouth sore secondary to chemotherapy   5. Hypokalemia     Patient expressed understanding and was in agreement with this plan. She also understands that She can call clinic at any time with any questions, concerns, or complaints.   Thank you for allowing me to participate in the care of this very pleasant patient.   Beckey Rutter, DNP, AGNP-C [facility] (work cell) [phone] (office)  CC: Dr. Hoy Finlay, NP

## 2019-01-01 NOTE — Progress Notes (Signed)
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    INTERVAL HISTORY: Patient returns to clinic today for further evaluation and consideration of cycle 2 of Taxotere and Cyramza.  She continues to have difficulty with dysphasia and swallowing.  Because of this she has a poor appetite.  She denies any pain.  She continues to have chronic weakness and fatigue.  She has no neurologic complaints.  She denies any recent fevers or illnesses. She denies any chest pain, shortness of breath, hemoptysis, or cough.  She has no nausea, vomiting, constipation, or diarrhea.  She has no melena or hematochezia.  She has no urinary complaints.  Patient offers no further specific complaints today.  REVIEW OF SYSTEMS:   Review of Systems  Constitutional: Positive for malaise/fatigue. Negative for fever and weight loss.  HENT:       Dysphasia.  Respiratory: Negative.  Negative for cough and shortness of breath.   Cardiovascular: Negative.  Negative for chest pain and leg swelling.  Gastrointestinal: Negative.  Negative for abdominal pain, blood in stool and melena.  Genitourinary: Negative.  Negative for dysuria and flank pain.  Musculoskeletal: Negative for back pain and joint pain.  Skin: Negative.  Negative for rash.  Neurological: Positive for weakness. Negative for sensory change, focal weakness and headaches.  Psychiatric/Behavioral: Negative.  Negative for depression. The patient is not nervous/anxious.     As per HPI. Otherwise, a complete review of systems is negative.  PAST MEDICAL HISTORY: Past Medical History:  Diagnosis Date  . Arthritis    right shoulder  .  Cancer of right lung ([facility]) 08/2017   Chemo + rad tx's.   Marland Kitchen Hyperlipidemia   . Hypertension   . Hypothyroidism   . Menopausal state   . Personal history of chemotherapy 2019   lung ca  . Personal history of radiation therapy 2019   lung ca  . Thyroid disease     PAST SURGICAL HISTORY: Past Surgical History:  Procedure Laterality Date  . ABDOMINAL HYSTERECTOMY  2005  . BREAST EXCISIONAL BIOPSY Right 1979   benign  . COLONOSCOPY WITH PROPOFOL N/A 05/13/2017   Procedure: COLONOSCOPY WITH PROPOFOL;  Surgeon: [name], MD;  Location: [facility] ENDOSCOPY;  Service: Gastroenterology;  Laterality: N/A;  . CYSTECTOMY Right    breast  . CYSTECTOMY  02/2015   back of neck  . ESOPHAGOGASTRODUODENOSCOPY (EGD) WITH PROPOFOL N/A 05/13/2017   Procedure: ESOPHAGOGASTRODUODENOSCOPY (EGD) WITH PROPOFOL;  Surgeon: [name], MD;  Location: [facility] ENDOSCOPY;  Service: Gastroenterology;  Laterality: N/A;  . PORTA CATH INSERTION N/A 09/01/2017   Procedure: PORTA CATH INSERTION;  Surgeon: Algernon Huxley, MD;  Location: [facility] CV LAB;  Service: Cardiovascular;  Laterality: N/A;  . TUBAL LIGATION  1986    FAMILY HISTORY: Family History  Problem Relation Age of Onset  . Hypertension Mother   . Stroke Mother   . Leukemia Mother   . Stroke Father   . Pneumonia Father   . Diabetes Sister   . Hyperlipidemia Sister   . Breast cancer Maternal Aunt 29    ADVANCED DIRECTIVES (Y/N):  N  HEALTH MAINTENANCE: Social History   Tobacco Use  . Smoking status: Former Smoker  Packs/day: 0.25    Types: Cigarettes    Last attempt to quit: 02/01/2017    Years since quitting: 1.9  . Smokeless tobacco: Never Used  Substance Use Topics  . Alcohol use: No    Alcohol/week: 0.0 standard drinks  . Drug use: No     Colonoscopy:  PAP:  Bone density:  Lipid panel:  No Known Allergies  Current Outpatient Medications  Medication Sig Dispense Refill  . amLODipine (NORVASC) 5 MG tablet TAKE 1 TABLET BY  MOUTH DAILY (Patient taking differently: Take 5 mg by mouth daily. ) 30 tablet 0  . atorvastatin (LIPITOR) 10 MG tablet TAKE 1 TABLET DAILY AT 6 P.M. (Patient taking differently: Take 10 mg by mouth daily at 6 PM. ) 10 tablet 0  . DULoxetine (CYMBALTA) 30 MG capsule Take 1 capsule (30 mg total) by mouth daily. 30 capsule 3  . folic acid (FOLVITE) 1 MG tablet Take 1 tablet (1 mg total) by mouth daily. 90 tablet 4  . levothyroxine (SYNTHROID) 50 MCG tablet TAKE 1 TABLET DAILY 90 tablet 0  . lidocaine-prilocaine (EMLA) cream Apply 1 application topically daily as needed (access).   3  . magic mouthwash w/lidocaine SOLN Take 5-10 mLs by mouth 4 (four) times daily as needed (mouth sores & pain). 480 mL 3  . Oxycodone HCl 10 MG TABS Take 1 tablet (10 mg total) by mouth every 6 (six) hours as needed. 120 tablet 0  . pantoprazole (PROTONIX) 40 MG tablet Take 1 tablet (40 mg total) by mouth daily. 30 tablet 0  . potassium chloride 20 MEQ/15ML (10%) SOLN Take 15 mLs (20 mEq total) by mouth 2 (two) times daily. 473 mL 0  . fluconazole (DIFLUCAN) 200 MG tablet Take 1 tablet (200 mg total) by mouth daily for 7 days. For oropharyngeal candidiasis 7 tablet 0   No current facility-administered medications for this visit.    Facility-Administered Medications Ordered in Other Visits  Medication Dose Route Frequency Provider Last Rate Last Dose  . acetaminophen (TYLENOL) tablet 650 mg  650 mg Oral Once Lloyd Huger, MD      . DOCEtaxel (TAXOTERE) 110 mg in sodium chloride 0.9 % 250 mL chemo infusion  75 mg/m2 (Treatment Plan Recorded) Intravenous Once Lloyd Huger, MD 261 mL/hr at 01/05/19 1322 110 mg at 01/05/19 1322  . heparin lock flush 100 unit/mL  500 Units Intravenous Once Lloyd Huger, MD      . pegfilgrastim (NEULASTA ONPRO KIT) injection 6 mg  6 mg Subcutaneous Once Lloyd Huger, MD        OBJECTIVE: Vitals:   01/05/19 0917  BP: 127/82  Pulse: 80  Temp: 97.9 F (36.6 C)      Body mass index is 19.33 kg/m.    ECOG FS:1 - Symptomatic but completely ambulatory  General: Thin, no acute distress. Eyes: Pink conjunctiva, anicteric sclera. HEENT: Normocephalic, moist mucous membranes, clear oropharnyx. Lungs: Clear to auscultation bilaterally. Heart: Regular rate and rhythm. No rubs, murmurs, or gallops. Abdomen: Soft, nontender, nondistended. No organomegaly noted, normoactive bowel sounds. Musculoskeletal: No edema, cyanosis, or clubbing. Neuro: Alert, answering all questions appropriately. Cranial nerves grossly intact. Skin: No rashes or petechiae noted. Psych: Normal affect.  LAB RESULTS:  Lab Results  Component Value Date   NA 137 01/05/2019   K 2.0 (LL) 01/05/2019   CL 95 (L) 01/05/2019   CO2 31 01/05/2019   GLUCOSE 96 01/05/2019   BUN 7 (L) 01/05/2019   CREATININE 1.07 (  H) 01/05/2019   CALCIUM 7.4 (L) 01/05/2019   PROT 7.1 01/05/2019   ALBUMIN 3.4 (L) 01/05/2019   AST 28 01/05/2019   ALT 14 01/05/2019   ALKPHOS 78 01/05/2019   BILITOT 0.5 01/05/2019   GFRNONAA 55 (L) 01/05/2019   GFRAA >60 01/05/2019    Lab Results  Component Value Date   WBC 9.6 01/05/2019   NEUTROABS 7.2 01/05/2019   HGB 8.8 (L) 01/05/2019   HCT 27.3 (L) 01/05/2019   MCV 100.0 01/05/2019   PLT 312 01/05/2019     STUDIES: Ct Soft Tissue Neck W Contrast  Result Date: 12/19/2018 CLINICAL DATA:  Difficulty swallowing. Stage IV lung cancer. Dysphagia. EXAM: CT NECK WITH CONTRAST TECHNIQUE: Multidetector CT imaging of the neck was performed using the standard protocol following the bolus administration of intravenous contrast. CONTRAST:  38m OMNIPAQUE IOHEXOL 300 MG/ML  SOLN COMPARISON:  CT of the neck 11/21/2014 FINDINGS: Pharynx and larynx: No focal mucosal or submucosal lesions are present. 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 Oropharynx is within normal limits. Epiglottis is normal. Vocal cords are midline and symmetric. Trachea is  normal. Salivary glands: Submandibular and parotid glands are within normal limits bilaterally. Thyroid: 8 mm nodule in the left lobe of the thyroid is stable. Lymph nodes: No significant cervical adenopathy is present. Vascular: Atherosclerotic calcifications are present within the carotid There is no significant arteries stenosis relative to the more distal vessels. Limited intracranial: Within normal limits. Visualized orbits: The globes and orbits are within normal limits. Mastoids and visualized paranasal sinuses: The paranasal sinuses and mastoid air cells are clear. Skeleton: Previously noted exostosis adjacent to the left mastoid has been resected. No focal lytic or blastic lesions are present. Upper chest: Post radiation changes in the right upper lobe are stable. No new lesions are present. Extensive atherosclerotic changes are noted at the aortic arch. Left IJ Port-A-Cath is in place. Other: There is generalized loss of subcutaneous fat. IMPRESSION: 1. No evidence for metastatic disease to the neck or other focal etiology to explain dysphagia. 2. Generalized cachexia. 3. Post treatment changes in the right upper lobe. Electronically Signed   By: CSan MorelleM.D.   On: 12/19/2018 14:02   Dg Chest Portable 1 View  Result Date: 12/19/2018 CLINICAL DATA:  Known lung cancer.  Dysphagia. EXAM: PORTABLE CHEST 1 VIEW COMPARISON:  CT scan Dec 14, 2018 FINDINGS: A left Port-A-Cath terminates near the caval atrial junction. Post therapeutic changes are seen in the right paramediastinal region. The pulmonary metastases seen on the comparison CT scan are not visualized on this study. No other interval changes. IMPRESSION: Post therapeutic changes in the paramediastinal right upper lobe. No other acute abnormalities. Electronically Signed   By: DDorise BullionIII M.D   On: 12/19/2018 13:40   Dg Esophagus W Double Cm (hd)  Result Date: 12/29/2018 CLINICAL DATA:  Difficulty swallowing. EXAM:  ESOPHOGRAM/BARIUM SWALLOW TECHNIQUE: Combined double contrast and single contrast examination performed using effervescent crystals, thick barium liquid, and thin barium liquid. FLUOROSCOPY TIME:  Fluoroscopy Time:  2 minutes 12 seconds Radiation Exposure Index (if provided by the fluoroscopic device): 42.5 mGy Number of Acquired Spot Images: 48 COMPARISON:  Chest CT 12/04/2018. FINDINGS: Cervical esophagus is widely patent. Tiny anterior cervical web noted. Thoracic esophagus is widely patent pliable. No obstructing lesion noted. Peristalsis was normal. Minimal reflux noted. A barium tablet was not administered due to the patient's difficulty with swallowing. IMPRESSION: Minimal gastroesophageal reflux noted.  Exam otherwise  unremarkable. 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     PLAN:    1. 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  CT scan results from Dec 04, 2018 reviewed independently and reported as above with mild progression of pulmonary and adrenal metastasis, and significant progression of liver metastasis.  Patient wishes to continue with palliative treatment.  Proceed with cycle 2 of Taxotere and Cyramza today along with On-Pro Neulasta.  Return to clinic in 3 weeks for further evaluation and consideration of cycle 3.  Will reimage at the conclusion of cycle 4.  2.  Anemia: Hemoglobin trending down slightly to 8.8.  Monitor. 3.  Pain: Patient does not complain of pain today.  Continue current narcotic regimen as prescribed. 4.  Palliative care: Appreciate input. 5.  Left shoulder mobility: Appreciate rehab input. 6.  Hypokalemia: Potassium significantly worse today at 2.0.  Continue oral potassium supplementation.  Patient will also receive  40 mEq IV potassium today, on Thursday, in 1 week.  She will also have further evaluation in 1 week. 7.  Dysphasia: Unclear etiology.  Swallow study and CT scan of the neck are negative.  Patient has been evaluation by GI later this week and an MRI of her brain on Saturday.  Follow-up in 1 week as above. 8.  Chronic renal insufficiency: Patient's creatinine essentially within normal limits at 1.07.  Patient expressed understanding and was in agreement with this plan. She also understands that She can call clinic at any time with any questions, concerns, or complaints.   Cancer Staging Cancer of upper lobe of right lung [facility]) Staging form: Lung, AJCC 8th Edition - Clinical stage from 08/24/2017: Stage IV (cT2a, cN3, cM1c) - Signed by Lloyd Huger, MD on 03/29/2018   Lloyd Huger, MD   01/05/2019 1:24 PM

## 2019-01-04 ENCOUNTER — Encounter: Payer: Self-pay | Admitting: Gastroenterology

## 2019-01-05 ENCOUNTER — Inpatient Hospital Stay (HOSPITAL_BASED_OUTPATIENT_CLINIC_OR_DEPARTMENT_OTHER): Payer: BC Managed Care – PPO | Admitting: Nurse Practitioner

## 2019-01-05 ENCOUNTER — Other Ambulatory Visit: Payer: Self-pay

## 2019-01-05 ENCOUNTER — Encounter: Payer: Self-pay | Admitting: Oncology

## 2019-01-05 ENCOUNTER — Encounter: Payer: Self-pay | Admitting: Nurse Practitioner

## 2019-01-05 ENCOUNTER — Inpatient Hospital Stay: Payer: BC Managed Care – PPO

## 2019-01-05 ENCOUNTER — Inpatient Hospital Stay: Payer: BC Managed Care – PPO | Attending: Oncology

## 2019-01-05 ENCOUNTER — Inpatient Hospital Stay (HOSPITAL_BASED_OUTPATIENT_CLINIC_OR_DEPARTMENT_OTHER): Payer: BC Managed Care – PPO | Admitting: Oncology

## 2019-01-05 VITALS — BP 127/82 | HR 80 | Temp 97.9°F | Ht 60.0 in | Wt 99.0 lb

## 2019-01-05 DIAGNOSIS — N189 Chronic kidney disease, unspecified: Secondary | ICD-10-CM | POA: Insufficient documentation

## 2019-01-05 DIAGNOSIS — C3411 Malignant neoplasm of upper lobe, right bronchus or lung: Secondary | ICD-10-CM

## 2019-01-05 DIAGNOSIS — E041 Nontoxic single thyroid nodule: Secondary | ICD-10-CM | POA: Diagnosis not present

## 2019-01-05 DIAGNOSIS — C7801 Secondary malignant neoplasm of right lung: Secondary | ICD-10-CM

## 2019-01-05 DIAGNOSIS — Z923 Personal history of irradiation: Secondary | ICD-10-CM | POA: Insufficient documentation

## 2019-01-05 DIAGNOSIS — I10 Essential (primary) hypertension: Secondary | ICD-10-CM | POA: Diagnosis not present

## 2019-01-05 DIAGNOSIS — E039 Hypothyroidism, unspecified: Secondary | ICD-10-CM | POA: Diagnosis not present

## 2019-01-05 DIAGNOSIS — Z87891 Personal history of nicotine dependence: Secondary | ICD-10-CM | POA: Diagnosis not present

## 2019-01-05 DIAGNOSIS — E785 Hyperlipidemia, unspecified: Secondary | ICD-10-CM

## 2019-01-05 DIAGNOSIS — C3490 Malignant neoplasm of unspecified part of unspecified bronchus or lung: Secondary | ICD-10-CM

## 2019-01-05 DIAGNOSIS — E876 Hypokalemia: Secondary | ICD-10-CM

## 2019-01-05 DIAGNOSIS — D649 Anemia, unspecified: Secondary | ICD-10-CM

## 2019-01-05 DIAGNOSIS — Z803 Family history of malignant neoplasm of breast: Secondary | ICD-10-CM

## 2019-01-05 DIAGNOSIS — C787 Secondary malignant neoplasm of liver and intrahepatic bile duct: Secondary | ICD-10-CM

## 2019-01-05 DIAGNOSIS — Z5111 Encounter for antineoplastic chemotherapy: Secondary | ICD-10-CM | POA: Insufficient documentation

## 2019-01-05 DIAGNOSIS — Z79899 Other long term (current) drug therapy: Secondary | ICD-10-CM | POA: Insufficient documentation

## 2019-01-05 DIAGNOSIS — Z95828 Presence of other vascular implants and grafts: Secondary | ICD-10-CM

## 2019-01-05 DIAGNOSIS — B370 Candidal stomatitis: Secondary | ICD-10-CM | POA: Insufficient documentation

## 2019-01-05 DIAGNOSIS — C7951 Secondary malignant neoplasm of bone: Secondary | ICD-10-CM | POA: Diagnosis not present

## 2019-01-05 DIAGNOSIS — Z5112 Encounter for antineoplastic immunotherapy: Secondary | ICD-10-CM | POA: Diagnosis not present

## 2019-01-05 DIAGNOSIS — Z5189 Encounter for other specified aftercare: Secondary | ICD-10-CM | POA: Diagnosis not present

## 2019-01-05 DIAGNOSIS — Z1211 Encounter for screening for malignant neoplasm of colon: Secondary | ICD-10-CM | POA: Diagnosis present

## 2019-01-05 LAB — CBC WITH DIFFERENTIAL/PLATELET
Abs Immature Granulocytes: 0.6 10*3/uL — ABNORMAL HIGH (ref 0.00–0.07)
Basophils Absolute: 0 10*3/uL (ref 0.0–0.1)
Basophils Relative: 0 %
Eosinophils Absolute: 0 10*3/uL (ref 0.0–0.5)
Eosinophils Relative: 0 %
HCT: 27.3 % — ABNORMAL LOW (ref 36.0–46.0)
Hemoglobin: 8.8 g/dL — ABNORMAL LOW (ref 12.0–15.0)
Immature Granulocytes: 6 %
Lymphocytes Relative: 9 %
Lymphs Abs: 0.9 10*3/uL (ref 0.7–4.0)
MCH: 32.2 pg (ref 26.0–34.0)
MCHC: 32.2 g/dL (ref 30.0–36.0)
MCV: 100 fL (ref 80.0–100.0)
Monocytes Absolute: 0.9 10*3/uL (ref 0.1–1.0)
Monocytes Relative: 9 %
Neutro Abs: 7.2 10*3/uL (ref 1.7–7.7)
Neutrophils Relative %: 76 %
Platelets: 312 10*3/uL (ref 150–400)
RBC: 2.73 MIL/uL — ABNORMAL LOW (ref 3.87–5.11)
RDW: 15.6 % — ABNORMAL HIGH (ref 11.5–15.5)
WBC: 9.6 10*3/uL (ref 4.0–10.5)
nRBC: 0.9 % — ABNORMAL HIGH (ref 0.0–0.2)

## 2019-01-05 LAB — PROTEIN, URINE, RANDOM: Total Protein, Urine: 39 mg/dL

## 2019-01-05 LAB — COMPREHENSIVE METABOLIC PANEL
ALT: 14 U/L (ref 0–44)
AST: 28 U/L (ref 15–41)
Albumin: 3.4 g/dL — ABNORMAL LOW (ref 3.5–5.0)
Alkaline Phosphatase: 78 U/L (ref 38–126)
Anion gap: 11 (ref 5–15)
BUN: 7 mg/dL — ABNORMAL LOW (ref 8–23)
CO2: 31 mmol/L (ref 22–32)
Calcium: 7.4 mg/dL — ABNORMAL LOW (ref 8.9–10.3)
Chloride: 95 mmol/L — ABNORMAL LOW (ref 98–111)
Creatinine, Ser: 1.07 mg/dL — ABNORMAL HIGH (ref 0.44–1.00)
GFR calc Af Amer: >60 mL/min (ref >60)
GFR calc non Af Amer: 55 mL/min — ABNORMAL LOW (ref >60)
Glucose, Bld: 96 mg/dL (ref 70–99)
Potassium: 2 mmol/L — CL (ref 3.5–5.1)
Sodium: 137 mmol/L (ref 135–145)
Total Bilirubin: 0.5 mg/dL (ref 0.3–1.2)
Total Protein: 7.1 g/dL (ref 6.5–8.1)

## 2019-01-05 LAB — SAMPLE TO BLOOD BANK

## 2019-01-05 LAB — MAGNESIUM: Magnesium: 1.8 mg/dL (ref 1.7–2.4)

## 2019-01-05 MED ORDER — HEPARIN SOD (PORK) LOCK FLUSH 100 UNIT/ML IV SOLN
500.0000 [IU] | Freq: Once | INTRAVENOUS | Status: AC
  Administered 2019-01-05: 500 [IU] via INTRAVENOUS

## 2019-01-05 MED ORDER — ACETAMINOPHEN 325 MG PO TABS
650.0000 mg | ORAL_TABLET | Freq: Once | ORAL | Status: DC
  Filled 2019-01-05: qty 2

## 2019-01-05 MED ORDER — SODIUM CHLORIDE 0.9 % IV SOLN
40.0000 meq | Freq: Once | INTRAVENOUS | Status: AC
  Administered 2019-01-05: 40 meq via INTRAVENOUS
  Filled 2019-01-05: qty 20

## 2019-01-05 MED ORDER — SODIUM CHLORIDE 0.9 % IV SOLN
10.0000 mg/kg | Freq: Once | INTRAVENOUS | Status: AC
  Administered 2019-01-05: 500 mg via INTRAVENOUS
  Filled 2019-01-05: qty 50

## 2019-01-05 MED ORDER — SODIUM CHLORIDE 0.9 % IV SOLN
75.0000 mg/m2 | Freq: Once | INTRAVENOUS | Status: AC
  Administered 2019-01-05: 110 mg via INTRAVENOUS
  Filled 2019-01-05: qty 11

## 2019-01-05 MED ORDER — SODIUM CHLORIDE 0.9% FLUSH
10.0000 mL | Freq: Once | INTRAVENOUS | Status: AC
  Administered 2019-01-05: 10 mL via INTRAVENOUS
  Filled 2019-01-05: qty 10

## 2019-01-05 MED ORDER — DEXAMETHASONE SODIUM PHOSPHATE 10 MG/ML IJ SOLN
10.0000 mg | Freq: Once | INTRAMUSCULAR | Status: AC
  Administered 2019-01-05: 10 mg via INTRAVENOUS
  Filled 2019-01-05: qty 1

## 2019-01-05 MED ORDER — DIPHENHYDRAMINE HCL 50 MG/ML IJ SOLN
25.0000 mg | Freq: Once | INTRAMUSCULAR | Status: AC
  Administered 2019-01-05: 25 mg via INTRAVENOUS
  Filled 2019-01-05: qty 1

## 2019-01-05 MED ORDER — SODIUM CHLORIDE 0.9 % IV SOLN
Freq: Once | INTRAVENOUS | Status: AC
  Administered 2019-01-05: 11:00:00 via INTRAVENOUS
  Filled 2019-01-05: qty 250

## 2019-01-05 MED ORDER — PEGFILGRASTIM 6 MG/0.6ML ~~LOC~~ PSKT
6.0000 mg | PREFILLED_SYRINGE | Freq: Once | SUBCUTANEOUS | Status: AC
  Administered 2019-01-05: 6 mg via SUBCUTANEOUS
  Filled 2019-01-05: qty 0.6

## 2019-01-05 MED ORDER — FLUCONAZOLE 200 MG PO TABS: 200.0000 mg | ORAL_TABLET | Freq: Every day | ORAL | 0 refills | Status: DC

## 2019-01-05 NOTE — Progress Notes (Signed)
Patient stated that after she went home with her Neulasta pump, things went downhill for her. Patient stated that she went to the ED on 12/19/2018 due to dysphagia. Patient was given Potassium because it was low and mouth wash for her mouth sores when she was at the ED. Patient continues to have dysphagia, sore throat and mouth sores.

## 2019-01-05 NOTE — Patient Instructions (Addendum)
Oral Thrush, Adult  Oral thrush is an infection in your mouth and throat. It causes white patches on your tongue and in your mouth. Follow these instructions at home: Helping with soreness   To lessen your pain: ? Drink cold liquids, like water and iced tea. ? Eat frozen ice pops or frozen juices. ? Eat foods that are easy to swallow, like gelatin and ice cream. ? Drink from a straw if the patches in your mouth are painful. General instructions  Take or use over-the-counter and prescription medicines only as told by your doctor. Medicine for oral thrush may be something to swallow, or it may be something to put on the infected area.  Eat plain yogurt that has live cultures in it. Read the label to make sure.  If you wear dentures: ? Take out your dentures before you go to bed. ? Brush them well. ? Soak them in a denture cleaner.  Rinse your mouth with warm salt-water many times a day. To make the salt-water mixture, completely dissolve 1/2-1 teaspoon of salt in 1 cup of warm water. Contact a doctor if:  Your problems are getting worse.  Your problems do not get better in less than 7 days with treatment.  Your infection is spreading. This may show as white patches on the skin outside of your mouth.  You are nursing your baby and you have redness and pain in the nipples. This information is not intended to replace advice given to you by your health care provider. Make sure you discuss any questions you have with your health care provider. Document Released: 10/16/2009 Document Revised: 04/15/2016 Document Reviewed: 04/15/2016 Elsevier Interactive Patient Education  2019 [employer]  

## 2019-01-05 NOTE — Progress Notes (Signed)
[facility]  [phone] [phone]   Name: Janet [name] Date: 01/05/2019 MRN: [id number]  DOB: 04/16/1956  Patient Care Team: Volney American, PA-C as PCP - General (Family Medicine) [name], RN as Registered Nurse    REASON FOR CONSULTATION: Palliative Care consult requested for this 63 y.o. 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  Palliative care was asked to help support patient through treatment and address goals of care.   SOCIAL HISTORY:    Patient is married but separated.  Patient lives alone.  She has a son and daughter.  Patient used to work on an [employer], television/film set.  ADVANCE DIRECTIVES:  Does not have  CODE STATUS: Full code  PAST MEDICAL HISTORY: Past Medical History:  Diagnosis Date   Arthritis    right shoulder   Cancer of right lung ([place]) 08/2017   Chemo + rad tx's.    Hyperlipidemia    Hypertension    Hypothyroidism    Menopausal state    Personal history of chemotherapy 2019   lung ca   Personal history of radiation therapy 2019   lung ca   Thyroid disease     PAST SURGICAL HISTORY:  Past Surgical History:  Procedure Laterality Date   ABDOMINAL HYSTERECTOMY  2005   BREAST EXCISIONAL BIOPSY Right 1979   benign   COLONOSCOPY WITH PROPOFOL N/A 05/13/2017   Procedure: COLONOSCOPY WITH PROPOFOL;  Surgeon: [name], MD;  Location: [facility] ENDOSCOPY;  Service: Gastroenterology;  Laterality: N/A;   CYSTECTOMY Right    breast   CYSTECTOMY  02/2015   back of neck   ESOPHAGOGASTRODUODENOSCOPY (EGD) WITH PROPOFOL N/A 05/13/2017   Procedure: ESOPHAGOGASTRODUODENOSCOPY (EGD) WITH PROPOFOL;  Surgeon: [name], MD;  Location: [facility] ENDOSCOPY;  Service: Gastroenterology;  Laterality: N/A;   PORTA CATH INSERTION  N/A 09/01/2017   Procedure: PORTA CATH INSERTION;  Surgeon: Algernon Huxley, MD;  Location: [place] CV LAB;  Service: Cardiovascular;  Laterality: N/A;   TUBAL LIGATION  1986    HEMATOLOGY/ONCOLOGY HISTORY:    Cancer of upper lobe of right lung ([place])   07/09/2017 Initial Diagnosis    Cancer of upper lobe of right lung ([place])    04/01/2018 - 12/07/2018 Chemotherapy    The patient had palonosetron (ALOXI) injection 0.25 mg, 0.25 mg, Intravenous,  Once, 12 of 18 cycles Administration: 0.25 mg (04/01/2018), 0.25 mg (04/22/2018), 0.25 mg (05/13/2018), 0.25 mg (06/03/2018), 0.25 mg (06/23/2018), 0.25 mg (07/14/2018), 0.25 mg (08/04/2018), 0.25 mg (08/25/2018), 0.25 mg (09/15/2018), 0.25 mg (10/06/2018), 0.25 mg (10/27/2018), 0.25 mg (11/17/2018) bevacizumab (AVASTIN) 900 mg in sodium chloride 0.9 % 100 mL chemo infusion, 925 mg, Intravenous,  Once, 12 of 18 cycles Administration: 900 mg (04/01/2018), 900 mg (04/22/2018), 800 mg (06/03/2018), 800 mg (06/23/2018), 800 mg (07/14/2018), 800 mg (08/04/2018), 800 mg (08/25/2018), 800 mg (09/15/2018), 800 mg (10/06/2018), 800 mg (10/27/2018), 800 mg (11/17/2018) PEMEtrexed (ALIMTA) 800 mg in sodium chloride 0.9 % 100 mL chemo infusion, 800 mg, Intravenous,  Once, 12 of 18 cycles Administration: 800 mg (04/01/2018), 800 mg (04/22/2018), 800 mg (05/13/2018), 800 mg (06/03/2018), 800 mg (06/23/2018), 800 mg (07/14/2018), 800 mg (08/04/2018), 800 mg (08/25/2018), 800 mg (09/15/2018), 800 mg (10/06/2018), 800 mg (10/27/2018), 800 mg (11/17/2018) CARBOplatin (PARAPLATIN) 480 mg in sodium chloride 0.9 % 250 mL chemo infusion, 480 mg (100 %  of original dose 480.5 mg), Intravenous,  Once, 8 of 8 cycles Dose modification:   (original dose 480.5 mg, Cycle 1) Administration: 480 mg (04/01/2018), 480 mg (04/22/2018), 480 mg (05/13/2018), 400 mg (06/03/2018), 440 mg (06/23/2018), 440 mg (07/14/2018), 440 mg (08/04/2018), 430 mg (08/25/2018) fosaprepitant (EMEND) 150 mg, dexamethasone (DECADRON) 12 mg in  sodium chloride 0.9 % 145 mL IVPB, , Intravenous,  Once, 12 of 18 cycles Administration:  (04/01/2018),  (04/22/2018),  (05/13/2018),  (06/03/2018),  (06/23/2018),  (07/14/2018),  (08/04/2018),  (08/25/2018),  (09/15/2018),  (10/06/2018),  (10/27/2018),  (11/17/2018)  for chemotherapy treatment.     12/15/2018 -  Chemotherapy    The patient had pegfilgrastim (NEULASTA ONPRO KIT) injection 6 mg, 6 mg, Subcutaneous, Once, 2 of 6 cycles Administration: 6 mg (12/15/2018) DOCEtaxel (TAXOTERE) 110 mg in sodium chloride 0.9 % 250 mL chemo infusion, 75 mg/m2 = 110 mg, Intravenous,  Once, 2 of 6 cycles Administration: 110 mg (12/15/2018) ramucirumab (CYRAMZA) 500 mg in sodium chloride 0.9 % 200 mL chemo infusion, 10 mg/kg = 500 mg, Intravenous, Once, 2 of 6 cycles Administration: 500 mg (12/15/2018)  for chemotherapy treatment.      ALLERGIES:  has No Known Allergies.  MEDICATIONS:  Current Outpatient Medications  Medication Sig Dispense Refill   amLODipine (NORVASC) 5 MG tablet TAKE 1 TABLET BY MOUTH DAILY (Patient taking differently: Take 5 mg by mouth daily. ) 30 tablet 0   atorvastatin (LIPITOR) 10 MG tablet TAKE 1 TABLET DAILY AT 6 P.M. (Patient taking differently: Take 10 mg by mouth daily at 6 PM. ) 10 tablet 0   DULoxetine (CYMBALTA) 30 MG capsule Take 1 capsule (30 mg total) by mouth daily. 30 capsule 3   folic acid (FOLVITE) 1 MG tablet Take 1 tablet (1 mg total) by mouth daily. 90 tablet 4   levothyroxine (SYNTHROID) 50 MCG tablet TAKE 1 TABLET DAILY 90 tablet 0   lidocaine-prilocaine (EMLA) cream Apply 1 application topically daily as needed (access).   3   magic mouthwash w/lidocaine SOLN Take 5-10 mLs by mouth 4 (four) times daily as needed (mouth sores & pain). 480 mL 3   Oxycodone HCl 10 MG TABS Take 1 tablet (10 mg total) by mouth every 6 (six) hours as needed. 120 tablet 0   pantoprazole (PROTONIX) 40 MG tablet Take 1 tablet (40 mg total) by mouth daily. 30 tablet 0   potassium  chloride 20 MEQ/15ML (10%) SOLN Take 15 mLs (20 mEq total) by mouth 2 (two) times daily. 473 mL 0   No current facility-administered medications for this visit.    Facility-Administered Medications Ordered in Other Visits  Medication Dose Route Frequency Provider Last Rate Last Dose   acetaminophen (TYLENOL) tablet 650 mg  650 mg Oral Once Lloyd Huger, MD       DOCEtaxel (TAXOTERE) 110 mg in sodium chloride 0.9 % 250 mL chemo infusion  75 mg/m2 (Treatment Plan Recorded) Intravenous Once Lloyd Huger, MD       pegfilgrastim (NEULASTA ONPRO KIT) injection 6 mg  6 mg Subcutaneous Once Lloyd Huger, MD       potassium chloride 40 mEq in sodium chloride 0.9 % 270 mL (0.1481 mEq/mL) infusion  40 mEq Intravenous Once Lloyd Huger, MD 135 mL/hr at 01/05/19 1101 40 mEq at 01/05/19 1101   ramucirumab (CYRAMZA) 500 mg in sodium chloride 0.9 % 200 mL chemo infusion  10 mg/kg (Treatment Plan Recorded) Intravenous Once Lloyd Huger, MD  VITAL SIGNS: LMP 08/08/2003 (Approximate) Comment: Hysterectomy 2004 There were no vitals filed for this visit.  Estimated body mass index is 19.33 kg/m as calculated from the following:   Height as of an earlier encounter on 01/05/19: 5' (1.524 m).   Weight as of an earlier encounter on 01/05/19: 99 lb (44.9 kg).  LABS: CBC:    Component Value Date/Time   WBC 9.6 01/05/2019 0839   HGB 8.8 (L) 01/05/2019 0839   HGB 10.8 (L) 07/09/2017 1615   HCT 27.3 (L) 01/05/2019 0839   HCT 32.7 (L) 07/09/2017 1615   PLT 312 01/05/2019 0839   PLT 394 (H) 07/09/2017 1615   MCV 100.0 01/05/2019 0839   MCV 92 07/09/2017 1615   NEUTROABS 7.2 01/05/2019 0839   NEUTROABS 2.5 07/09/2017 1615   LYMPHSABS 0.9 01/05/2019 0839   LYMPHSABS 3.1 07/09/2017 1615   MONOABS 0.9 01/05/2019 0839   EOSABS 0.0 01/05/2019 0839   EOSABS 0.1 07/09/2017 1615   BASOSABS 0.0 01/05/2019 0839   BASOSABS 0.0 07/09/2017 1615   Comprehensive Metabolic  Panel:    Component Value Date/Time   NA 137 01/05/2019 0839   NA 147 (H) 07/09/2017 1615   K 2.0 (LL) 01/05/2019 0839   CL 95 (L) 01/05/2019 0839   CO2 31 01/05/2019 0839   BUN 7 (L) 01/05/2019 0839   BUN 8 07/09/2017 1615   CREATININE 1.07 (H) 01/05/2019 0839   GLUCOSE 96 01/05/2019 0839   CALCIUM 7.4 (L) 01/05/2019 0839   AST 28 01/05/2019 0839   ALT 14 01/05/2019 0839   ALKPHOS 78 01/05/2019 0839   BILITOT 0.5 01/05/2019 0839   BILITOT <0.2 07/09/2017 1615   PROT 7.1 01/05/2019 0839   PROT 6.6 07/09/2017 1615   ALBUMIN 3.4 (L) 01/05/2019 0839   ALBUMIN 4.3 07/09/2017 1615    RADIOGRAPHIC STUDIES: Ct Soft Tissue Neck W Contrast  Result Date: 12/19/2018 CLINICAL DATA:  Difficulty swallowing. Stage IV lung cancer. Dysphagia. EXAM: CT NECK WITH CONTRAST TECHNIQUE: Multidetector CT imaging of the neck was performed using the standard protocol following the bolus administration of intravenous contrast. CONTRAST:  5m OMNIPAQUE IOHEXOL 300 MG/ML  SOLN COMPARISON:  CT of the neck 11/21/2014 FINDINGS: Pharynx and larynx: No focal mucosal or submucosal lesions are present. 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 Oropharynx is within normal limits. Epiglottis is normal. Vocal cords are midline and symmetric. Trachea is normal. Salivary glands: Submandibular and parotid glands are within normal limits bilaterally. Thyroid: 8 mm nodule in the left lobe of the thyroid is stable. Lymph nodes: No significant cervical adenopathy is present. Vascular: Atherosclerotic calcifications are present within the carotid There is no significant arteries stenosis relative to the more distal vessels. Limited intracranial: Within normal limits. Visualized orbits: The globes and orbits are within normal limits. Mastoids and visualized paranasal sinuses: The paranasal sinuses and mastoid air cells are clear. Skeleton: Previously noted exostosis adjacent to the left mastoid has been  resected. No focal lytic or blastic lesions are present. Upper chest: Post radiation changes in the right upper lobe are stable. No new lesions are present. Extensive atherosclerotic changes are noted at the aortic arch. Left IJ Port-A-Cath is in place. Other: There is generalized loss of subcutaneous fat. IMPRESSION: 1. No evidence for metastatic disease to the neck or other focal etiology to explain dysphagia. 2. Generalized cachexia. 3. Post treatment changes in the right upper lobe. Electronically Signed   By: CSan MorelleM.D.   On: 12/19/2018  14:02   Dg Chest Portable 1 View  Result Date: 12/19/2018 CLINICAL DATA:  Known lung cancer.  Dysphagia. EXAM: PORTABLE CHEST 1 VIEW COMPARISON:  CT scan Dec 14, 2018 FINDINGS: A left Port-A-Cath terminates near the caval atrial junction. Post therapeutic changes are seen in the right paramediastinal region. The pulmonary metastases seen on the comparison CT scan are not visualized on this study. No other interval changes. IMPRESSION: Post therapeutic changes in the paramediastinal right upper lobe. No other acute abnormalities. Electronically Signed   By: Dorise Bullion III M.D   On: 12/19/2018 13:40   Dg Esophagus W Double Cm (hd)  Result Date: 12/29/2018 CLINICAL DATA:  Difficulty swallowing. EXAM: ESOPHOGRAM/BARIUM SWALLOW TECHNIQUE: Combined double contrast and single contrast examination performed using effervescent crystals, thick barium liquid, and thin barium liquid. FLUOROSCOPY TIME:  Fluoroscopy Time:  2 minutes 12 seconds Radiation Exposure Index (if provided by the fluoroscopic device): 42.5 mGy Number of Acquired Spot Images: 48 COMPARISON:  Chest CT 12/04/2018. FINDINGS: Cervical esophagus is widely patent. Tiny anterior cervical web noted. Thoracic esophagus is widely patent pliable. No obstructing lesion noted. Peristalsis was normal. Minimal reflux noted. A barium tablet was not administered due to the patient's difficulty with  swallowing. IMPRESSION: Minimal gastroesophageal reflux noted.  Exam otherwise unremarkable. Electronically Signed   By: Marcello Moores  Register   On: 12/29/2018 11:16    PERFORMANCE STATUS (ECOG) : 1 - Symptomatic but completely ambulatory  Review of Systems As noted above. Otherwise, a complete review of systems is negative.  Physical Exam General: NAD, frail appearing, thin Pulmonary: CTA ant fields Cardiac: RRR ABD: soft, nttp Extremities: no edema, no joint deformities Skin: no rashes Neurological: Weakness but otherwise nonfocal  IMPRESSION: I met with patient today in infusion area.  She was seen in symptom management clinic last week.  Today, she says that difficulty swallowing and spitting up of saliva has significantly improved.  She is no longer having to carry a bag around to spit into.  She has not yet had MRI of the brain but it scheduled for later this week.  She is also scheduled to follow-up with GI later this week.  Says she is tolerating PPI well.  She is using Magic mouthwash for mouth sores but feels it makes her sleepy.  Says this does not bother her though.  She still unable to wear dentures and notices white patches on her gums and continues to report overall soreness of her mouth and throat.  Weight is slightly up from previous visit but overall reduced.  She continues to drink nutritional supplement drinks throughout the day.  Says she misses eating big meals of potatoes, meat, and green beans.  She has not tried using a blender for some of her food intake.  Encouraged this again today.  Again encouraged taking medications with applesauce.  I will refer to RD today.  She says that she has started liquid potassium and is tolerating it better than tablets but potassium level today is still consistently low.  She will receive IV replacement today per Dr. Grayland Ormond.  Encouraged use of potassium.  And consumption of foods high in potassium.  She says pain is stable and  well-controlled.  Continue Cymbalta 30 mg.  Could consider increasing to 60 mg daily if needed in the future.  She denies needing refills today.  We will revisit most form and consider completion in vynca when patient is seen in exam room.  She denies any other acute changes or  concerns.  She says that interventions have made a "big help" and she would like to come back to clinic in a week for reevaluation.    PLAN: - start diflucan 228m tablet daily x 7 days; if improvement but not resolved consider extending for 14 days of treatment.  - Continue oxycodone as needed for pain - Continue duloxetine 333mdaily. Consider increasing to 6062mf needed - continue liquid potassium - continue PPI - MOST form on Vynca when patient seen in exam room - RTC in 1 week  Patient expressed understanding and was in agreement with this plan. She also understands that She can call clinic at any time with any questions, concerns, or complaints.   Time Total: 15 minutes  Visit consisted of counseling and education dealing with the complex and emotionally intense issues of symptom management and palliative care in the setting of serious and potentially life-threatening illness.Greater than 50%  of this time was spent counseling and coordinating care related to the above assessment and plan.  Signed by: LauBeckey RutterNP, AGNP-C [place] [facility]) [phone])  CC: Dr. FinGrayland Ormond

## 2019-01-07 ENCOUNTER — Inpatient Hospital Stay: Payer: BC Managed Care – PPO

## 2019-01-07 ENCOUNTER — Ambulatory Visit (INDEPENDENT_AMBULATORY_CARE_PROVIDER_SITE_OTHER): Payer: BC Managed Care – PPO | Admitting: Gastroenterology

## 2019-01-07 ENCOUNTER — Other Ambulatory Visit: Payer: Self-pay

## 2019-01-07 VITALS — BP 100/62 | HR 96 | Resp 20

## 2019-01-07 DIAGNOSIS — C3411 Malignant neoplasm of upper lobe, right bronchus or lung: Secondary | ICD-10-CM

## 2019-01-07 DIAGNOSIS — R1310 Dysphagia, unspecified: Secondary | ICD-10-CM | POA: Diagnosis not present

## 2019-01-07 MED ORDER — SODIUM CHLORIDE 0.9 % IV SOLN
Freq: Once | INTRAVENOUS | Status: AC
  Administered 2019-01-07: 13:00:00 via INTRAVENOUS
  Filled 2019-01-07: qty 250

## 2019-01-07 MED ORDER — HEPARIN SOD (PORK) LOCK FLUSH 100 UNIT/ML IV SOLN
500.0000 [IU] | Freq: Once | INTRAVENOUS | Status: AC | PRN
  Administered 2019-01-07: 500 [IU]

## 2019-01-07 MED ORDER — SODIUM CHLORIDE 0.9 % IV SOLN
40.0000 meq | Freq: Once | INTRAVENOUS | Status: AC
  Administered 2019-01-07: 40 meq via INTRAVENOUS
  Filled 2019-01-07: qty 20

## 2019-01-07 MED ORDER — SODIUM CHLORIDE 0.9% FLUSH
10.0000 mL | Freq: Once | INTRAVENOUS | Status: AC | PRN
  Administered 2019-01-07: 10 mL
  Filled 2019-01-07: qty 10

## 2019-01-07 NOTE — Progress Notes (Signed)
Janet [name] , MD [address]  [employer]  [place], [place] [address]  Main: [phone]  Fax: [phone]   Primary Care Physician: Volney American, PA-C  Virtual Visit via Telephone Note  I connected with patient on 01/07/19 at 11:00 AM EDT by telephone and verified that I am speaking with the correct person using two identifiers.   I discussed the limitations, risks, security and privacy concerns of performing an evaluation and management service by telephone and the availability of in person appointments. I also discussed with the patient that there may be a patient responsible charge related to this service. The patient expressed understanding and agreed to proceed.  Location of Patient: Home Location of Provider: Home Persons involved: Patient and provider only   History of Present Illness: Chief Complaint  Patient presents with   Follow-up    Dysphagia    HPI: [name] is a 63 y.o. female   Summary of history : Janet [name] is a 63 y.o. female has a history of stage IV lung cancer and presented to the emergency room on 12/19/2018 with issues of food getting stuck.  She is on chemotherapy.  She underwent a CT scan of the neck which showed no evidence of metastatic disease.  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  Mildly progressive left adrenal metastasis.  I have myself performed of upper endoscopy in October 2018 and similarly was for evaluation of dysphagia but no abnormality was seen.  Esophageal biopsies were negative for eosinophils.  She is undergoing palliative care treatment.   She says that she has been having difficulty swallowing solids and liquids for a few months which has been gradually getting worse.  She states that it at the level of the Adam's apple that she feels it is abnormal.  Denies any coughing.  Denies any clear heartburn.    Interval  history   12/21/2018-  01/07/2019  12/29/2018: Barium swallow : Mild reflux seen otherwise esophagus is patent.  Awaiting MRI brain. Doing "pretty good", swallowing is better than it was . Denies any coughing when she eats. No pain when she eats.     Current Outpatient Medications  Medication Sig Dispense Refill   amLODipine (NORVASC) 5 MG tablet TAKE 1 TABLET BY MOUTH DAILY (Patient taking differently: Take 5 mg by mouth daily. ) 30 tablet 0   atorvastatin (LIPITOR) 10 MG tablet TAKE 1 TABLET DAILY AT 6 P.M. (Patient taking differently: Take 10 mg by mouth daily at 6 PM. ) 10 tablet 0   DULoxetine (CYMBALTA) 30 MG capsule Take 1 capsule (30 mg total) by mouth daily. 30 capsule 3   fluconazole (DIFLUCAN) 200 MG tablet Take 1 tablet (200 mg total) by mouth daily for 7 days. For oropharyngeal candidiasis 7 tablet 0   folic acid (FOLVITE) 1 MG tablet Take 1 tablet (1 mg total) by mouth daily. 90 tablet 4   levothyroxine (SYNTHROID) 50 MCG tablet TAKE 1 TABLET DAILY 90 tablet 0   lidocaine-prilocaine (EMLA) cream Apply 1 application topically daily as needed (access).   3   magic mouthwash w/lidocaine SOLN Take 5-10 mLs by mouth 4 (four) times daily as needed (mouth sores & pain). 480 mL 3   Oxycodone HCl 10 MG TABS Take 1 tablet (10 mg total) by mouth every 6 (six) hours as needed. 120 tablet 0   pantoprazole (PROTONIX) 40 MG tablet Take  1 tablet (40 mg total) by mouth daily. 30 tablet 0   potassium chloride 20 MEQ/15ML (10%) SOLN Take 15 mLs (20 mEq total) by mouth 2 (two) times daily. 473 mL 0   No current facility-administered medications for this visit.     Allergies as of 01/07/2019   (No Known Allergies)    Review of Systems:    All systems reviewed and negative except where noted in HPI.   Observations/Objective:  Labs: CMP     Component Value Date/Time   NA 137 01/05/2019 0839   NA 147 (H) 07/09/2017 1615   K 2.0 (LL) 01/05/2019 0839   CL 95 (L) 01/05/2019 0839    CO2 31 01/05/2019 0839   GLUCOSE 96 01/05/2019 0839   BUN 7 (L) 01/05/2019 0839   BUN 8 07/09/2017 1615   CREATININE 1.07 (H) 01/05/2019 0839   CALCIUM 7.4 (L) 01/05/2019 0839   PROT 7.1 01/05/2019 0839   PROT 6.6 07/09/2017 1615   ALBUMIN 3.4 (L) 01/05/2019 0839   ALBUMIN 4.3 07/09/2017 1615   AST 28 01/05/2019 0839   ALT 14 01/05/2019 0839   ALKPHOS 78 01/05/2019 0839   BILITOT 0.5 01/05/2019 0839   BILITOT <0.2 07/09/2017 1615   GFRNONAA 55 (L) 01/05/2019 0839   GFRAA >60 01/05/2019 0839   Lab Results  Component Value Date   WBC 9.6 01/05/2019   HGB 8.8 (L) 01/05/2019   HCT 27.3 (L) 01/05/2019   MCV 100.0 01/05/2019   PLT 312 01/05/2019    Imaging Studies: Ct Soft Tissue Neck W Contrast  Result Date: 12/19/2018 CLINICAL DATA:  Difficulty swallowing. Stage IV lung cancer. Dysphagia. EXAM: CT NECK WITH CONTRAST TECHNIQUE: Multidetector CT imaging of the neck was performed using the standard protocol following the bolus administration of intravenous contrast. CONTRAST:  45mL OMNIPAQUE IOHEXOL 300 MG/ML  SOLN COMPARISON:  CT of the neck 11/21/2014 FINDINGS: Pharynx and larynx: No focal mucosal or submucosal lesions are present. 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 Oropharynx is within normal limits. Epiglottis is normal. Vocal cords are midline and symmetric. Trachea is normal. Salivary glands: Submandibular and parotid glands are within normal limits bilaterally. Thyroid: 8 mm nodule in the left lobe of the thyroid is stable. Lymph nodes: No significant cervical adenopathy is present. Vascular: Atherosclerotic calcifications are present within the carotid There is no significant arteries stenosis relative to the more distal vessels. Limited intracranial: Within normal limits. Visualized orbits: The globes and orbits are within normal limits. Mastoids and visualized paranasal sinuses: The paranasal sinuses and mastoid air cells are clear. Skeleton:  Previously noted exostosis adjacent to the left mastoid has been resected. No focal lytic or blastic lesions are present. Upper chest: Post radiation changes in the right upper lobe are stable. No new lesions are present. Extensive atherosclerotic changes are noted at the aortic arch. Left IJ Port-A-Cath is in place. Other: There is generalized loss of subcutaneous fat. IMPRESSION: 1. No evidence for metastatic disease to the neck or other focal etiology to explain dysphagia. 2. Generalized cachexia. 3. Post treatment changes in the right upper lobe. Electronically Signed   By: San Morelle M.D.   On: 12/19/2018 14:02   Dg Chest Portable 1 View  Result Date: 12/19/2018 CLINICAL DATA:  Known lung cancer.  Dysphagia. EXAM: PORTABLE CHEST 1 VIEW COMPARISON:  CT scan Dec 14, 2018 FINDINGS: A left Port-A-Cath terminates near the caval atrial junction. Post therapeutic changes are seen in the right paramediastinal region. The  pulmonary metastases seen on the comparison CT scan are not visualized on this study. No other interval changes. IMPRESSION: Post therapeutic changes in the paramediastinal right upper lobe. No other acute abnormalities. Electronically Signed   By: Dorise Bullion III M.D   On: 12/19/2018 13:40   Dg Esophagus W Double Cm (hd)  Result Date: 12/29/2018 CLINICAL DATA:  Difficulty swallowing. EXAM: ESOPHOGRAM/BARIUM SWALLOW TECHNIQUE: Combined double contrast and single contrast examination performed using effervescent crystals, thick barium liquid, and thin barium liquid. FLUOROSCOPY TIME:  Fluoroscopy Time:  2 minutes 12 seconds Radiation Exposure Index (if provided by the fluoroscopic device): 42.5 mGy Number of Acquired Spot Images: 48 COMPARISON:  Chest CT 12/04/2018. FINDINGS: Cervical esophagus is widely patent. Tiny anterior cervical web noted. Thoracic esophagus is widely patent pliable. No obstructing lesion noted. Peristalsis was normal. Minimal reflux noted. A barium tablet  was not administered due to the patient's difficulty with swallowing. IMPRESSION: Minimal gastroesophageal reflux noted.  Exam otherwise unremarkable. Electronically Signed   By: Marcello Moores  Register   On: 12/29/2018 11:16    Assessment and Plan:   SHAMEIKA SPEELMAN is a 63 y.o. y/o female  with a history of stage IV lung cancer, on palliative care treatment.  Presented to the emergency room a few days back with dysphagia.  The patient states that the dysphagia has been for solids and liquids, for a few months gradually getting worse.  Suggest at the level of the Adam's apple that she is having difficulty swallowing.  She undergone radiation in the past and this could be part of the reason causing difficulty swallowing.Barium swallow showed no obstruction   Plan :   1. Await MRI brain - if negative then will consider EGD to r/o any severe esophagitis from acid reflux vs candida  F/u in 2 weeks   I discussed the assessment and treatment plan with the patient. The patient was provided an opportunity to ask questions and all were answered. The patient agreed with the plan and demonstrated an understanding of the instructions.   The patient was advised to call back or seek an in-person evaluation if the symptoms worsen or if the condition fails to improve as anticipated.  I provided 8 minutes of non-face-to-face time during this encounter.  Dr Janet Bellows MD,MRCP [facility]) Gastroenterology/Hepatology Pager: [phone]   Speech recognition software was used to dictate this note.

## 2019-01-09 ENCOUNTER — Ambulatory Visit
Admission: RE | Admit: 2019-01-09 | Discharge: 2019-01-09 | Disposition: A | Discharge status: Home / Self Care | Payer: BC Managed Care – PPO | Source: Ambulatory Visit | Attending: Nurse Practitioner | Admitting: Nurse Practitioner

## 2019-01-09 ENCOUNTER — Other Ambulatory Visit: Payer: Self-pay

## 2019-01-09 DIAGNOSIS — R1310 Dysphagia, unspecified: Secondary | ICD-10-CM

## 2019-01-09 DIAGNOSIS — C3490 Malignant neoplasm of unspecified part of unspecified bronchus or lung: Secondary | ICD-10-CM | POA: Diagnosis not present

## 2019-01-09 MED ORDER — GADOBUTROL 1 MMOL/ML IV SOLN
4.0000 mL | Freq: Once | INTRAVENOUS | Status: AC | PRN
  Administered 2019-01-09: 4 mL via INTRAVENOUS

## 2019-01-10 NOTE — Progress Notes (Signed)
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    INTERVAL HISTORY: Patient returns to clinic today for further evaluation and consideration of additional IV potassium and magnesium.  She received cycle 2 of Taxotere and Cyramza last week.  She continues to have difficulty with dysphasia and swallowing.  She continues to have a poor appetite.  She denies any pain.  She continues to have chronic weakness and fatigue.  She has no neurologic complaints.  She denies any recent fevers or illnesses. She denies any chest pain, shortness of breath, hemoptysis, or cough.  She has no nausea, vomiting, constipation, or diarrhea.  She has no melena or hematochezia.  She has no urinary complaints.  Patient offers no further specific complaints today.  REVIEW OF SYSTEMS:   Review of Systems  Constitutional: Positive for malaise/fatigue. Negative for fever and weight loss.  HENT:       Dysphasia.  Respiratory: Negative.  Negative for cough and shortness of breath.   Cardiovascular: Negative.  Negative for chest pain and leg swelling.  Gastrointestinal: Negative.  Negative for abdominal pain, blood in stool and melena.  Genitourinary: Negative.  Negative for dysuria and flank pain.  Musculoskeletal: Negative for back pain and joint pain.  Skin: Negative.  Negative for rash.  Neurological: Positive for weakness. Negative for sensory change, focal weakness and headaches.  Psychiatric/Behavioral: Negative.  Negative for depression. The patient is not nervous/anxious.     As per HPI. Otherwise, a complete review of systems is negative.  PAST MEDICAL HISTORY: Past Medical  History:  Diagnosis Date  . Arthritis    right shoulder  . Cancer of right lung ([place]) 08/2017   Chemo + rad tx's.   Marland Kitchen Hyperlipidemia   . Hypertension   . Hypothyroidism   . Menopausal state   . Personal history of chemotherapy 2019   lung ca  . Personal history of radiation therapy 2019   lung ca  . Thyroid disease     PAST SURGICAL HISTORY: Past Surgical History:  Procedure Laterality Date  . ABDOMINAL HYSTERECTOMY  2005  . BREAST EXCISIONAL BIOPSY Right 1979   benign  . COLONOSCOPY WITH PROPOFOL N/A 05/13/2017   Procedure: COLONOSCOPY WITH PROPOFOL;  Surgeon: [name], MD;  Location: [facility] ENDOSCOPY;  Service: Gastroenterology;  Laterality: N/A;  . CYSTECTOMY Right    breast  . CYSTECTOMY  02/2015   back of neck  . ESOPHAGOGASTRODUODENOSCOPY (EGD) WITH PROPOFOL N/A 05/13/2017   Procedure: ESOPHAGOGASTRODUODENOSCOPY (EGD) WITH PROPOFOL;  Surgeon: [name], MD;  Location[facility] ENDOSCOPY;  Service: Gastroenterology;  Laterality: N/A;  . PORTA CATH INSERTION N/A 09/01/2017   Procedure: PORTA CATH INSERTION;  Surgeon: Algernon Huxley, MD;  Location: [place] CV LAB;  Service: Cardiovascular;  Laterality: N/A;  . TUBAL LIGATION  1986    FAMILY HISTORY: Family History  Problem Relation Age of Onset  . Hypertension Mother   . Stroke Mother   . Leukemia Mother   . Stroke Father   . Pneumonia Father   . Diabetes Sister   . Hyperlipidemia Sister   . Breast cancer Maternal Aunt 29    ADVANCED DIRECTIVES (Y/N):  N  HEALTH MAINTENANCE: Social History   Tobacco  Use  . Smoking status: Former Smoker    Packs/day: 0.25    Types: Cigarettes    Last attempt to quit: 02/01/2017    Years since quitting: 1.9  . Smokeless tobacco: Never Used  Substance Use Topics  . Alcohol use: No    Alcohol/week: 0.0 standard drinks  . Drug use: No     Colonoscopy:  PAP:  Bone density:  Lipid panel:  No Known Allergies  Current Outpatient Medications  Medication Sig Dispense  Refill  . amLODipine (NORVASC) 5 MG tablet TAKE 1 TABLET BY MOUTH DAILY (Patient taking differently: Take 5 mg by mouth daily. ) 30 tablet 0  . atorvastatin (LIPITOR) 10 MG tablet TAKE 1 TABLET DAILY AT 6 P.M. (Patient taking differently: Take 10 mg by mouth daily at 6 PM. ) 10 tablet 0  . DULoxetine (CYMBALTA) 30 MG capsule Take 1 capsule (30 mg total) by mouth daily. 30 capsule 3  . folic acid (FOLVITE) 1 MG tablet Take 1 tablet (1 mg total) by mouth daily. 90 tablet 4  . levothyroxine (SYNTHROID) 50 MCG tablet TAKE 1 TABLET DAILY 90 tablet 0  . lidocaine-prilocaine (EMLA) cream Apply 1 application topically daily as needed (access).   3  . magic mouthwash w/lidocaine SOLN Take 5-10 mLs by mouth 4 (four) times daily as needed (mouth sores & pain). 480 mL 3  . Oxycodone HCl 10 MG TABS Take 1 tablet (10 mg total) by mouth every 6 (six) hours as needed. 120 tablet 0  . pantoprazole (PROTONIX) 40 MG tablet Take 1 tablet (40 mg total) by mouth daily. 30 tablet 0  . potassium chloride 20 MEQ/15ML (10%) SOLN Take 15 mLs (20 mEq total) by mouth 2 (two) times daily. 473 mL 0  . fluconazole (DIFLUCAN) 200 MG tablet Take 1 tablet (200 mg total) by mouth daily. 5 tablet 0   No current facility-administered medications for this visit.     OBJECTIVE: Vitals:   01/12/19 0927  BP: (!) 96/57  Pulse: (!) 106  Temp: (!) 97 F (36.1 C)     Body mass index is 19.14 kg/m.    ECOG FS:1 - Symptomatic but completely ambulatory  General: Thin, no acute distress. Eyes: Pink conjunctiva, anicteric sclera. HEENT: Normocephalic, moist mucous membranes, clear oropharnyx. Lungs: Clear to auscultation bilaterally. Heart: Regular rate and rhythm. No rubs, murmurs, or gallops. Abdomen: Soft, nontender, nondistended. No organomegaly noted, normoactive bowel sounds. Musculoskeletal: No edema, cyanosis, or clubbing. Neuro: Alert, answering all questions appropriately. Cranial nerves grossly intact. Skin: No rashes or  petechiae noted. Psych: Normal affect.  LAB RESULTS:  Lab Results  Component Value Date   NA 138 01/12/2019   K 2.8 (L) 01/12/2019   CL 98 01/12/2019   CO2 29 01/12/2019   GLUCOSE 162 (H) 01/12/2019   BUN 13 01/12/2019   CREATININE 1.41 (H) 01/12/2019   CALCIUM 8.4 (L) 01/12/2019   PROT 6.9 01/12/2019   ALBUMIN 3.2 (L) 01/12/2019   AST 21 01/12/2019   ALT 12 01/12/2019   ALKPHOS 103 01/12/2019   BILITOT 0.5 01/12/2019   GFRNONAA 40 (L) 01/12/2019   GFRAA 46 (L) 01/12/2019    Lab Results  Component Value Date   WBC 10.1 01/12/2019   NEUTROABS 8.1 (H) 01/12/2019   HGB 8.9 (L) 01/12/2019   HCT 27.4 (L) 01/12/2019   MCV 100.7 (H) 01/12/2019   PLT 153 01/12/2019     STUDIES: Ct Soft Tissue Neck W Contrast  Result Date: 12/19/2018 CLINICAL DATA:  Difficulty swallowing. Stage IV lung cancer. Dysphagia. EXAM: CT NECK WITH CONTRAST TECHNIQUE: Multidetector CT imaging of the neck was performed using the standard protocol following the bolus administration of intravenous contrast. CONTRAST:  32mL OMNIPAQUE IOHEXOL 300 MG/ML  SOLN COMPARISON:  CT of the neck 11/21/2014 FINDINGS: Pharynx and larynx: No focal mucosal or submucosal lesions are present. 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 Oropharynx is within normal limits. Epiglottis is normal. Vocal cords are midline and symmetric. Trachea is normal. Salivary glands: Submandibular and parotid glands are within normal limits bilaterally. Thyroid: 8 mm nodule in the left lobe of the thyroid is stable. Lymph nodes: No significant cervical adenopathy is present. Vascular: Atherosclerotic calcifications are present within the carotid There is no significant arteries stenosis relative to the more distal vessels. Limited intracranial: Within normal limits. Visualized orbits: The globes and orbits are within normal limits. Mastoids and visualized paranasal sinuses: The paranasal sinuses and mastoid air cells are  clear. Skeleton: Previously noted exostosis adjacent to the left mastoid has been resected. No focal lytic or blastic lesions are present. Upper chest: Post radiation changes in the right upper lobe are stable. No new lesions are present. Extensive atherosclerotic changes are noted at the aortic arch. Left IJ Port-A-Cath is in place. Other: There is generalized loss of subcutaneous fat. IMPRESSION: 1. No evidence for metastatic disease to the neck or other focal etiology to explain dysphagia. 2. Generalized cachexia. 3. Post treatment changes in the right upper lobe. Electronically Signed   By: San Morelle M.D.   On: 12/19/2018 14:02   Mr Jeri Cos MH Contrast  Result Date: 01/09/2019 CLINICAL DATA:  Dysphagia.  Lung cancer, staging. EXAM: MRI HEAD WITHOUT AND WITH CONTRAST TECHNIQUE: Multiplanar, multiecho pulse sequences of the brain and surrounding structures were obtained without and with intravenous contrast. CONTRAST:  Gadavist 4 mL. COMPARISON:  CT neck 12/19/2018.  MR head 08/04/2017. FINDINGS: Brain: No acute infarction, hemorrhage, hydrocephalus, extra-axial collection or mass lesion. Normal for age cerebral volume. Minor white matter disease, not unexpected for age. 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 Otherwise, no abnormal enhancement of the brain or meninges. Vascular: Flow voids are maintained throughout the carotid, basilar, and vertebral arteries. There are no areas of chronic hemorrhage. Skull and upper cervical spine: There is slight heterogeneity of the clivus, loss of marrow T1 shortening, on sagittal T1 weighted images, concerning for early skull base metastatic disease. See for instance series 9, image 12. This also represents an interval change in appearance from previous MR 08/04/2017. Changes are more difficult to see on recent CT. Sinuses/Orbits: Negative. Other: Small Thornwaldt cyst, posterior nasopharynx, 3 x 5 mm  cross-section. No mastoid fluid. IMPRESSION: MRI of the brain demonstrates no intracranial metastatic disease. Incidental faint enhancement central pons, characteristic of capillary telangiectasia. Abnormal marrow signal in the clivus, best seen on precontrast T1 weighted images, concerning for osseous metastatic disease. Incidental small Thornwaldt cyst, of uncertain significance with regard to dysphagia. Electronically Signed   By: Staci Righter M.D.   On: 01/09/2019 10:03   Dg Chest Portable 1 View  Result Date: 12/19/2018 CLINICAL DATA:  Known lung cancer.  Dysphagia. EXAM: PORTABLE CHEST 1 VIEW COMPARISON:  CT scan Dec 14, 2018 FINDINGS: A left Port-A-Cath terminates near the caval atrial junction. Post therapeutic changes are seen in the right paramediastinal region. The pulmonary metastases seen on the comparison CT scan are not visualized on this study. No other interval  changes. IMPRESSION: Post therapeutic changes in the paramediastinal right upper lobe. No other acute abnormalities. Electronically Signed   By: Dorise Bullion III M.D   On: 12/19/2018 13:40   Dg Esophagus W Double Cm (hd)  Result Date: 12/29/2018 CLINICAL DATA:  Difficulty swallowing. EXAM: ESOPHOGRAM/BARIUM SWALLOW TECHNIQUE: Combined double contrast and single contrast examination performed using effervescent crystals, thick barium liquid, and thin barium liquid. FLUOROSCOPY TIME:  Fluoroscopy Time:  2 minutes 12 seconds Radiation Exposure Index (if provided by the fluoroscopic device): 42.5 mGy Number of Acquired Spot Images: 48 COMPARISON:  Chest CT 12/04/2018. FINDINGS: Cervical esophagus is widely patent. Tiny anterior cervical web noted. Thoracic esophagus is widely patent pliable. No obstructing lesion noted. Peristalsis was normal. Minimal reflux noted. A barium tablet was not administered due to the patient's difficulty with swallowing. IMPRESSION: Minimal gastroesophageal reflux noted.  Exam otherwise unremarkable.  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     PLAN:    1. 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  CT scan results from Dec 04, 2018 reviewed independently with mild progression of pulmonary and adrenal metastasis, and significant progression of liver metastasis.  Repeat MRI of her brain on January 09, 2019 did not reveal any metastatic disease.  Patient wishes to continue with palliative treatment.  Patient received cycle 2 of Taxotere and Cyramza today along with On-Pro Neulasta last week.  Return to clinic in 2 weeks for further evaluation and consideration of cycle 3.  Will reimage at the conclusion of cycle 4.  2.  Anemia: Hemoglobin decreased, but stable at 8.9.  Monitor. 3.  Pain: Patient does not complain of pain today.  Continue current narcotic regimen as prescribed. 4.  Palliative care: Appreciate input. 5.  Left shoulder mobility: Appreciate rehab input. 6.  Hypokalemia: Potassium improved today.  Proceed with 40 mEq IV potassium.  Continue oral potassium supplementation.  7.  Dysphasia: Unclear etiology.  Swallow study and CT scan of the neck are negative.  Continue follow-up with GI.  MRI of the brain was negative as above.  Patient was empirically given fluconazole for possible candidiasis.  8.  Chronic renal insufficiency: Creatinine has trended up slightly to 1.41.  Monitor. 9.  Hypomagnesia: Proceed with 2 g IV magnesium today.  Patient expressed understanding and was in agreement with this plan. She also understands that She can call clinic at any time with any questions, concerns, or complaints.   Cancer Staging Cancer of upper lobe of right lung [facility]) Staging form: Lung, AJCC 8th Edition - Clinical stage from 08/24/2017: Stage IV  (cT2a, cN3, cM1c) - Signed by Lloyd Huger, MD on 03/29/2018   Lloyd Huger, MD   01/12/2019 3:47 PM

## 2019-01-12 ENCOUNTER — Inpatient Hospital Stay: Payer: BC Managed Care – PPO

## 2019-01-12 ENCOUNTER — Inpatient Hospital Stay (HOSPITAL_BASED_OUTPATIENT_CLINIC_OR_DEPARTMENT_OTHER): Payer: BC Managed Care – PPO | Admitting: Oncology

## 2019-01-12 ENCOUNTER — Other Ambulatory Visit: Payer: Self-pay

## 2019-01-12 ENCOUNTER — Encounter: Payer: Self-pay | Admitting: Oncology

## 2019-01-12 VITALS — BP 96/57 | HR 106 | Temp 97.0°F | Ht 60.0 in | Wt 98.0 lb

## 2019-01-12 DIAGNOSIS — C7951 Secondary malignant neoplasm of bone: Secondary | ICD-10-CM

## 2019-01-12 DIAGNOSIS — Z923 Personal history of irradiation: Secondary | ICD-10-CM

## 2019-01-12 DIAGNOSIS — D649 Anemia, unspecified: Secondary | ICD-10-CM

## 2019-01-12 DIAGNOSIS — Z87891 Personal history of nicotine dependence: Secondary | ICD-10-CM

## 2019-01-12 DIAGNOSIS — C787 Secondary malignant neoplasm of liver and intrahepatic bile duct: Secondary | ICD-10-CM

## 2019-01-12 DIAGNOSIS — C3411 Malignant neoplasm of upper lobe, right bronchus or lung: Secondary | ICD-10-CM

## 2019-01-12 DIAGNOSIS — C7801 Secondary malignant neoplasm of right lung: Secondary | ICD-10-CM | POA: Diagnosis not present

## 2019-01-12 DIAGNOSIS — E041 Nontoxic single thyroid nodule: Secondary | ICD-10-CM

## 2019-01-12 DIAGNOSIS — Z95828 Presence of other vascular implants and grafts: Secondary | ICD-10-CM

## 2019-01-12 DIAGNOSIS — Z803 Family history of malignant neoplasm of breast: Secondary | ICD-10-CM

## 2019-01-12 DIAGNOSIS — E876 Hypokalemia: Secondary | ICD-10-CM | POA: Diagnosis not present

## 2019-01-12 DIAGNOSIS — E039 Hypothyroidism, unspecified: Secondary | ICD-10-CM

## 2019-01-12 DIAGNOSIS — I10 Essential (primary) hypertension: Secondary | ICD-10-CM

## 2019-01-12 DIAGNOSIS — N189 Chronic kidney disease, unspecified: Secondary | ICD-10-CM

## 2019-01-12 DIAGNOSIS — Z79899 Other long term (current) drug therapy: Secondary | ICD-10-CM

## 2019-01-12 DIAGNOSIS — E785 Hyperlipidemia, unspecified: Secondary | ICD-10-CM

## 2019-01-12 LAB — COMPREHENSIVE METABOLIC PANEL
ALT: 12 U/L (ref 0–44)
AST: 21 U/L (ref 15–41)
Albumin: 3.2 g/dL — ABNORMAL LOW (ref 3.5–5.0)
Alkaline Phosphatase: 103 U/L (ref 38–126)
Anion gap: 11 (ref 5–15)
BUN: 13 mg/dL (ref 8–23)
CO2: 29 mmol/L (ref 22–32)
Calcium: 8.4 mg/dL — ABNORMAL LOW (ref 8.9–10.3)
Chloride: 98 mmol/L (ref 98–111)
Creatinine, Ser: 1.41 mg/dL — ABNORMAL HIGH (ref 0.44–1.00)
GFR calc Af Amer: 46 mL/min — ABNORMAL LOW (ref >60)
GFR calc non Af Amer: 40 mL/min — ABNORMAL LOW (ref >60)
Glucose, Bld: 162 mg/dL — ABNORMAL HIGH (ref 70–99)
Potassium: 2.8 mmol/L — ABNORMAL LOW (ref 3.5–5.1)
Sodium: 138 mmol/L (ref 135–145)
Total Bilirubin: 0.5 mg/dL (ref 0.3–1.2)
Total Protein: 6.9 g/dL (ref 6.5–8.1)

## 2019-01-12 LAB — CBC WITH DIFFERENTIAL/PLATELET
Abs Immature Granulocytes: 0.64 10*3/uL — ABNORMAL HIGH (ref 0.00–0.07)
Basophils Absolute: 0 10*3/uL (ref 0.0–0.1)
Basophils Relative: 0 %
Eosinophils Absolute: 0 10*3/uL (ref 0.0–0.5)
Eosinophils Relative: 0 %
HCT: 27.4 % — ABNORMAL LOW (ref 36.0–46.0)
Hemoglobin: 8.9 g/dL — ABNORMAL LOW (ref 12.0–15.0)
Immature Granulocytes: 6 %
Lymphocytes Relative: 7 %
Lymphs Abs: 0.7 10*3/uL (ref 0.7–4.0)
MCH: 32.7 pg (ref 26.0–34.0)
MCHC: 32.5 g/dL (ref 30.0–36.0)
MCV: 100.7 fL — ABNORMAL HIGH (ref 80.0–100.0)
Monocytes Absolute: 0.6 10*3/uL (ref 0.1–1.0)
Monocytes Relative: 6 %
Neutro Abs: 8.1 10*3/uL — ABNORMAL HIGH (ref 1.7–7.7)
Neutrophils Relative %: 81 %
Platelets: 153 10*3/uL (ref 150–400)
RBC: 2.72 MIL/uL — ABNORMAL LOW (ref 3.87–5.11)
RDW: 15.9 % — ABNORMAL HIGH (ref 11.5–15.5)
Smear Review: ADEQUATE
WBC: 10.1 10*3/uL (ref 4.0–10.5)
nRBC: 1 % — ABNORMAL HIGH (ref 0.0–0.2)

## 2019-01-12 LAB — MAGNESIUM: Magnesium: 1.6 mg/dL — ABNORMAL LOW (ref 1.7–2.4)

## 2019-01-12 MED ORDER — SODIUM CHLORIDE 0.9 % IV SOLN
Freq: Once | INTRAVENOUS | Status: AC
  Administered 2019-01-12: 10:00:00 via INTRAVENOUS
  Filled 2019-01-12: qty 250

## 2019-01-12 MED ORDER — SODIUM CHLORIDE 0.9 % IV SOLN
Freq: Once | INTRAVENOUS | Status: AC
  Administered 2019-01-12: 11:00:00 via INTRAVENOUS
  Filled 2019-01-12: qty 20

## 2019-01-12 MED ORDER — HEPARIN SOD (PORK) LOCK FLUSH 100 UNIT/ML IV SOLN
500.0000 [IU] | Freq: Once | INTRAVENOUS | Status: AC | PRN
  Administered 2019-01-12: 500 [IU]
  Filled 2019-01-12: qty 5

## 2019-01-12 MED ORDER — SODIUM CHLORIDE 0.9% FLUSH
10.0000 mL | Freq: Once | INTRAVENOUS | Status: AC
  Administered 2019-01-12: 10 mL via INTRAVENOUS
  Filled 2019-01-12: qty 10

## 2019-01-12 MED ORDER — FLUCONAZOLE 200 MG PO TABS: 200.0000 mg | ORAL_TABLET | Freq: Every day | ORAL | 0 refills | Status: DC

## 2019-01-12 NOTE — Progress Notes (Signed)
Patient stated that she had not been feeling well. Patient feels fatigued, weakness and nauseated. Appetite is still not good. Patient denied fever, chills, vomiting, diarrhea, constipation.

## 2019-01-18 ENCOUNTER — Other Ambulatory Visit: Payer: Self-pay | Admitting: Oncology

## 2019-01-19 ENCOUNTER — Other Ambulatory Visit: Payer: Self-pay | Admitting: *Deleted

## 2019-01-19 NOTE — Progress Notes (Signed)
Patient not seen in infusion. Palliative care appt canceled.   Janet Casa, NP 01/19/2019 8:40 AM

## 2019-01-19 NOTE — Telephone Encounter (Signed)
This was refill yesterday

## 2019-01-20 ENCOUNTER — Other Ambulatory Visit: Payer: Self-pay | Admitting: *Deleted

## 2019-01-20 NOTE — Telephone Encounter (Signed)
EMLA was already refilled 6/15. Patient informed

## 2019-01-21 ENCOUNTER — Encounter: Payer: Self-pay | Admitting: Gastroenterology

## 2019-01-21 ENCOUNTER — Ambulatory Visit (INDEPENDENT_AMBULATORY_CARE_PROVIDER_SITE_OTHER): Payer: BC Managed Care – PPO | Admitting: Gastroenterology

## 2019-01-21 DIAGNOSIS — R1310 Dysphagia, unspecified: Secondary | ICD-10-CM | POA: Diagnosis not present

## 2019-01-21 NOTE — Progress Notes (Signed)
Janet [name] , MD [address]  [place]  [place], [place] [address]  Main: [phone]  Fax: [phone]   Primary Care Physician: Volney American, PA-C  Virtual Visit via Telephone Note  I connected with patient on 01/21/19 at  9:00 AM EDT by telephone and verified that I am speaking with the correct person using two identifiers.   I discussed the limitations, risks, security and privacy concerns of performing an evaluation and management service by telephone and the availability of in person appointments. I also discussed with the patient that there may be a patient responsible charge related to this service. The patient expressed understanding and agreed to proceed.  Location of Patient: Home Location of Provider: Home Persons involved: Patient and provider only   History of Present Illness: Chief Complaint  Patient presents with   Follow-up    Dysphagia    HPI: Janet [name] is a 63 y.o. female   Summary of history : Janet Carmer McBroomis a 63 y.o.femalehas a history of stage IV lung cancer and presented to the emergency room on 12/19/2018 with issues of food getting stuck. She is on chemotherapy. She underwent a CT scan of the neck which showed no evidence of metastatic disease. 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 Mildly progressive left adrenal metastasis. I have myself performed of upper endoscopy in October 2018 and similarly was for evaluation of dysphagia but no abnormality was seen. Esophageal biopsies were negative for eosinophils. She is undergoing palliative care treatment.   She says that she has been having difficulty swallowing solids and liquids for a few months which has been gradually getting worse. She states that it at the level of the Adam's apple that she feels it is abnormal. Denies any coughing. Denies any clear heartburn.  12/29/2018:  Barium swallow : Mild reflux seen otherwise esophagus is patent.   Interval history   01/07/2019-01/21/2019  MRI brain 01/09/2019: No intracranial metastatic disease.  She says her swallowing is better.     Current Outpatient Medications  Medication Sig Dispense Refill   amLODipine (NORVASC) 5 MG tablet TAKE 1 TABLET BY MOUTH DAILY (Patient taking differently: Take 5 mg by mouth daily. ) 30 tablet 0   atorvastatin (LIPITOR) 10 MG tablet TAKE 1 TABLET DAILY AT 6 P.M. (Patient taking differently: Take 10 mg by mouth daily at 6 PM. ) 10 tablet 0   DULoxetine (CYMBALTA) 30 MG capsule Take 1 capsule (30 mg total) by mouth daily. 30 capsule 3   fluconazole (DIFLUCAN) 200 MG tablet Take 1 tablet (200 mg total) by mouth daily. 5 tablet 0   folic acid (FOLVITE) 1 MG tablet Take 1 tablet (1 mg total) by mouth daily. 90 tablet 4   levothyroxine (SYNTHROID) 50 MCG tablet TAKE 1 TABLET DAILY 90 tablet 0   lidocaine-prilocaine (EMLA) cream APPLY EXTERNALLY TO THE AFFECTED AREA 1 TIME 30 g 1   magic mouthwash w/lidocaine SOLN Take 5-10 mLs by mouth 4 (four) times daily as needed (mouth sores & pain). 480 mL 3   Oxycodone HCl 10 MG TABS Take 1 tablet (10 mg total) by mouth every 6 (six) hours as needed. 120 tablet 0   pantoprazole (PROTONIX) 40 MG tablet Take 1 tablet (40 mg total) by mouth daily. 30 tablet 0   potassium chloride 20 MEQ/15ML (10%) SOLN Take 15 mLs (20 mEq total) by mouth 2 (two) times daily.  473 mL 0   No current facility-administered medications for this visit.     Allergies as of 01/21/2019   (No Known Allergies)    Review of Systems:    All systems reviewed and negative except where noted in HPI.   Observations/Objective:  Labs: CMP     Component Value Date/Time   NA 138 01/12/2019 0908   NA 147 (H) 07/09/2017 1615   K 2.8 (L) 01/12/2019 0908   CL 98 01/12/2019 0908   CO2 29 01/12/2019 0908   GLUCOSE 162 (H) 01/12/2019 0908   BUN 13 01/12/2019 0908   BUN 8  07/09/2017 1615   CREATININE 1.41 (H) 01/12/2019 0908   CALCIUM 8.4 (L) 01/12/2019 0908   PROT 6.9 01/12/2019 0908   PROT 6.6 07/09/2017 1615   ALBUMIN 3.2 (L) 01/12/2019 0908   ALBUMIN 4.3 07/09/2017 1615   AST 21 01/12/2019 0908   ALT 12 01/12/2019 0908   ALKPHOS 103 01/12/2019 0908   BILITOT 0.5 01/12/2019 0908   BILITOT <0.2 07/09/2017 1615   GFRNONAA 40 (L) 01/12/2019 0908   GFRAA 46 (L) 01/12/2019 0908   Lab Results  Component Value Date   WBC 10.1 01/12/2019   HGB 8.9 (L) 01/12/2019   HCT 27.4 (L) 01/12/2019   MCV 100.7 (H) 01/12/2019   PLT 153 01/12/2019    Imaging Studies: Mr Jeri Cos YD Contrast  Result Date: 01/09/2019 CLINICAL DATA:  Dysphagia.  Lung cancer, staging. EXAM: MRI HEAD WITHOUT AND WITH CONTRAST TECHNIQUE: Multiplanar, multiecho pulse sequences of the brain and surrounding structures were obtained without and with intravenous contrast. CONTRAST:  Gadavist 4 mL. COMPARISON:  CT neck 12/19/2018.  MR head 08/04/2017. FINDINGS: Brain: No acute infarction, hemorrhage, hydrocephalus, extra-axial collection or mass lesion. Normal for age cerebral volume. Minor white matter disease, not unexpected for age. 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 Otherwise, no abnormal enhancement of the brain or meninges. Vascular: Flow voids are maintained throughout the carotid, basilar, and vertebral arteries. There are no areas of chronic hemorrhage. Skull and upper cervical spine: There is slight heterogeneity of the clivus, loss of marrow T1 shortening, on sagittal T1 weighted images, concerning for early skull base metastatic disease. See for instance series 9, image 12. This also represents an interval change in appearance from previous MR 08/04/2017. Changes are more difficult to see on recent CT. Sinuses/Orbits: Negative. Other: Small Thornwaldt cyst, posterior nasopharynx, 3 x 5 mm cross-section. No mastoid fluid. IMPRESSION:  MRI of the brain demonstrates no intracranial metastatic disease. Incidental faint enhancement central pons, characteristic of capillary telangiectasia. Abnormal marrow signal in the clivus, best seen on precontrast T1 weighted images, concerning for osseous metastatic disease. Incidental small Thornwaldt cyst, of uncertain significance with regard to dysphagia. Electronically Signed   By: Staci Righter M.D.   On: 01/09/2019 10:03   Dg Esophagus W Double Cm (hd)  Result Date: 12/29/2018 CLINICAL DATA:  Difficulty swallowing. EXAM: ESOPHOGRAM/BARIUM SWALLOW TECHNIQUE: Combined double contrast and single contrast examination performed using effervescent crystals, thick barium liquid, and thin barium liquid. FLUOROSCOPY TIME:  Fluoroscopy Time:  2 minutes 12 seconds Radiation Exposure Index (if provided by the fluoroscopic device): 42.5 mGy Number of Acquired Spot Images: 48 COMPARISON:  Chest CT 12/04/2018. FINDINGS: Cervical esophagus is widely patent. Tiny anterior cervical web noted. Thoracic esophagus is widely patent pliable. No obstructing lesion noted. Peristalsis was normal. Minimal reflux noted. A barium tablet was not administered due to the patient's difficulty with  swallowing. IMPRESSION: Minimal gastroesophageal reflux noted.  Exam otherwise unremarkable. Electronically Signed   By: Marcello Moores  Register   On: 12/29/2018 11:16    Assessment and Plan:   CASHLYN HUGULEY is a 63 y.o. y/o female with a history of stage IV lung cancer, on palliative care treatment. Presented to the emergency room a few days back with dysphagia. The patient states that the dysphagia has been for solids and liquids, for a few months gradually getting worse. Suggest at the level of the Adam's apple that she is having difficulty swallowing. She undergone radiation in the past and this could be part of the reason causing difficulty swallowing.Barium swallow showed no obstruction , MRI brain non contributory.   Plan  :   1. Since swallowing is better advised to monitor for symptoms if develops difficulty with dysphagia then can proceed with endoscopy- she will call me office as needed    I discussed the assessment and treatment plan with the patient. The patient was provided an opportunity to ask questions and all were answered. The patient agreed with the plan and demonstrated an understanding of the instructions.   The patient was advised to call back or seek an in-person evaluation if the symptoms worsen or if the condition fails to improve as anticipated.  I provided 8 minutes of non-face-to-face time during this encounter.  Dr Janet Bellows MD,MRCP [facility]) Gastroenterology/Hepatology Pager: [phone]   Speech recognition software was used to dictate this note.

## 2019-01-23 NOTE — Progress Notes (Signed)
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    INTERVAL HISTORY: Patient returns to clinic today for further evaluation and consideration of cycle 3 of Taxotere and Cyramza.  Despite continued difficulties with swallowing, patient has an improved appetite.  Although she continues to lose weight.  She denies any pain.  She continues to have chronic weakness and fatigue.  She has no neurologic complaints.  She denies any recent fevers or illnesses. She denies any chest pain, shortness of breath, hemoptysis, or cough.  She has no nausea, vomiting, constipation, or diarrhea.  She has no melena or hematochezia.  She has no urinary complaints.  Patient offers no further specific complaints today.  REVIEW OF SYSTEMS:   Review of Systems  Constitutional: Positive for malaise/fatigue and weight loss. Negative for fever.  HENT:       Dysphasia.  Respiratory: Negative.  Negative for cough and shortness of breath.   Cardiovascular: Negative.  Negative for chest pain and leg swelling.  Gastrointestinal: Negative.  Negative for abdominal pain, blood in stool and melena.  Genitourinary: Negative.  Negative for dysuria and flank pain.  Musculoskeletal: Negative.  Negative for back pain and joint pain.  Skin: Negative.  Negative for rash.  Neurological: Positive for weakness. Negative for sensory change, focal weakness and headaches.  Psychiatric/Behavioral: Negative.  Negative for depression. The patient is not nervous/anxious.     As per HPI. Otherwise, a complete review of systems is negative.  PAST MEDICAL HISTORY: Past Medical History:  Diagnosis Date   Arthritis     right shoulder   Cancer of right lung ([place]) 08/2017   Chemo + rad tx's.    Hyperlipidemia    Hypertension    Hypothyroidism    Menopausal state    Personal history of chemotherapy 2019   lung ca   Personal history of radiation therapy 2019   lung ca   Thyroid disease     PAST SURGICAL HISTORY: Past Surgical History:  Procedure Laterality Date   ABDOMINAL HYSTERECTOMY  2005   BREAST EXCISIONAL BIOPSY Right 1979   benign   COLONOSCOPY WITH PROPOFOL N/A 05/13/2017   Procedure: COLONOSCOPY WITH PROPOFOL;  Surgeon: [name], MD;  Location: [facility] ENDOSCOPY;  Service: Gastroenterology;  Laterality: N/A;   CYSTECTOMY Right    breast   CYSTECTOMY  02/2015   back of neck   ESOPHAGOGASTRODUODENOSCOPY (EGD) WITH PROPOFOL N/A 05/13/2017   Procedure: ESOPHAGOGASTRODUODENOSCOPY (EGD) WITH PROPOFOL;  Surgeon: [name], MD;  Location: [facility] ENDOSCOPY;  Service: Gastroenterology;  Laterality: N/A;   PORTA CATH INSERTION N/A 09/01/2017   Procedure: PORTA CATH INSERTION;  Surgeon: Algernon Huxley, MD;  Location: [place] CV LAB;  Service: Cardiovascular;  Laterality: N/A;   TUBAL LIGATION  1986    FAMILY HISTORY: Family History  Problem Relation Age of Onset   Hypertension Mother    Stroke Mother    Leukemia Mother    Stroke Father    Pneumonia Father    Diabetes Sister    Hyperlipidemia Sister    Breast cancer Maternal Aunt 44    ADVANCED DIRECTIVES (Y/N):  N  HEALTH MAINTENANCE: Social History   Tobacco Use   Smoking status: Former Smoker  Packs/day: 0.25    Types: Cigarettes    Quit date: 02/01/2017    Years since quitting: 1.9   Smokeless tobacco: Never Used  Substance Use Topics   Alcohol use: No    Alcohol/week: 0.0 standard drinks   Drug use: No     Colonoscopy:  PAP:  Bone density:  Lipid panel:  No Known Allergies  Current Outpatient Medications  Medication Sig Dispense Refill   amLODipine (NORVASC) 5 MG tablet TAKE 1  TABLET BY MOUTH DAILY (Patient taking differently: Take 5 mg by mouth daily. ) 30 tablet 0   atorvastatin (LIPITOR) 10 MG tablet TAKE 1 TABLET DAILY AT 6 P.M. (Patient taking differently: Take 10 mg by mouth daily at 6 PM. ) 10 tablet 0   DULoxetine (CYMBALTA) 30 MG capsule Take 1 capsule (30 mg total) by mouth daily. 30 capsule 3   fluconazole (DIFLUCAN) 200 MG tablet Take 1 tablet (200 mg total) by mouth daily. 5 tablet 0   folic acid (FOLVITE) 1 MG tablet Take 1 tablet (1 mg total) by mouth daily. 90 tablet 4   levothyroxine (SYNTHROID) 50 MCG tablet TAKE 1 TABLET DAILY 90 tablet 0   lidocaine-prilocaine (EMLA) cream APPLY EXTERNALLY TO THE AFFECTED AREA 1 TIME 30 g 1   magic mouthwash w/lidocaine SOLN Take 5-10 mLs by mouth 4 (four) times daily as needed (mouth sores & pain). 480 mL 3   Oxycodone HCl 10 MG TABS Take 1 tablet (10 mg total) by mouth every 6 (six) hours as needed. 120 tablet 0   pantoprazole (PROTONIX) 40 MG tablet Take 1 tablet (40 mg total) by mouth daily. 30 tablet 0   potassium chloride 20 MEQ/15ML (10%) SOLN Take 15 mLs (20 mEq total) by mouth 2 (two) times daily. 473 mL 0   No current facility-administered medications for this visit.     OBJECTIVE: Vitals:   01/26/19 0857  BP: 112/79  Pulse: (!) 102  Temp: (!) 97.1 F (36.2 C)     Body mass index is 19.53 kg/m.    ECOG FS:1 - Symptomatic but completely ambulatory  General: Thin, no acute distress. Eyes: Pink conjunctiva, anicteric sclera. HEENT: Normocephalic, moist mucous membranes, clear oropharnyx. Lungs: Clear to auscultation bilaterally. Heart: Regular rate and rhythm. No rubs, murmurs, or gallops. Abdomen: Soft, nontender, nondistended. No organomegaly noted, normoactive bowel sounds. Musculoskeletal: No edema, cyanosis, or clubbing. Neuro: Alert, answering all questions appropriately. Cranial nerves grossly intact. Skin: No rashes or petechiae noted. Psych: Normal affect.  LAB  RESULTS:  Lab Results  Component Value Date   NA 137 01/26/2019   K 3.4 (L) 01/26/2019   CL 99 01/26/2019   CO2 26 01/26/2019   GLUCOSE 141 (H) 01/26/2019   BUN 18 01/26/2019   CREATININE 1.54 (H) 01/26/2019   CALCIUM 9.1 01/26/2019   PROT 7.1 01/26/2019   ALBUMIN 3.5 01/26/2019   AST 32 01/26/2019   ALT 13 01/26/2019   ALKPHOS 84 01/26/2019   BILITOT 0.3 01/26/2019   GFRNONAA 36 (L) 01/26/2019   GFRAA 41 (L) 01/26/2019    Lab Results  Component Value Date   WBC 6.4 01/26/2019   NEUTROABS 4.9 01/26/2019   HGB 9.0 (L) 01/26/2019   HCT 28.5 (L) 01/26/2019   MCV 105.2 (H) 01/26/2019   PLT 340 01/26/2019     STUDIES: Mr Jeri Cos RJ Contrast  Result Date: 01/09/2019 CLINICAL DATA:  Dysphagia.  Lung cancer, staging. EXAM: MRI HEAD WITHOUT AND WITH CONTRAST TECHNIQUE: Multiplanar, multiecho pulse  sequences of the brain and surrounding structures were obtained without and with intravenous contrast. CONTRAST:  Gadavist 4 mL. COMPARISON:  CT neck 12/19/2018.  MR head 08/04/2017. FINDINGS: Brain: No acute infarction, hemorrhage, hydrocephalus, extra-axial collection or mass lesion. Normal for age cerebral volume. Minor white matter disease, not unexpected for age. 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 Otherwise, no abnormal enhancement of the brain or meninges. Vascular: Flow voids are maintained throughout the carotid, basilar, and vertebral arteries. There are no areas of chronic hemorrhage. Skull and upper cervical spine: There is slight heterogeneity of the clivus, loss of marrow T1 shortening, on sagittal T1 weighted images, concerning for early skull base metastatic disease. See for instance series 9, image 12. This also represents an interval change in appearance from previous MR 08/04/2017. Changes are more difficult to see on recent CT. Sinuses/Orbits: Negative. Other: Small Thornwaldt cyst, posterior nasopharynx, 3 x 5 mm  cross-section. No mastoid fluid. IMPRESSION: MRI of the brain demonstrates no intracranial metastatic disease. Incidental faint enhancement central pons, characteristic of capillary telangiectasia. Abnormal marrow signal in the clivus, best seen on precontrast T1 weighted images, concerning for osseous metastatic disease. Incidental small Thornwaldt cyst, of uncertain significance with regard to dysphagia. Electronically Signed   By: Staci Righter M.D.   On: 01/09/2019 10:03   Dg Esophagus W Double Cm (hd)  Result Date: 12/29/2018 CLINICAL DATA:  Difficulty swallowing. EXAM: ESOPHOGRAM/BARIUM SWALLOW TECHNIQUE: Combined double contrast and single contrast examination performed using effervescent crystals, thick barium liquid, and thin barium liquid. FLUOROSCOPY TIME:  Fluoroscopy Time:  2 minutes 12 seconds Radiation Exposure Index (if provided by the fluoroscopic device): 42.5 mGy Number of Acquired Spot Images: 48 COMPARISON:  Chest CT 12/04/2018. FINDINGS: Cervical esophagus is widely patent. Tiny anterior cervical web noted. Thoracic esophagus is widely patent pliable. No obstructing lesion noted. Peristalsis was normal. Minimal reflux noted. A barium tablet was not administered due to the patient's difficulty with swallowing. IMPRESSION: Minimal gastroesophageal reflux noted.  Exam otherwise unremarkable. 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     PLAN:    1. 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  CT scan results from Dec 04, 2018 reviewed independently with mild progression of pulmonary and adrenal metastasis, and significant progression of liver metastasis.  Repeat MRI of her brain on January 09, 2019 did not reveal any metastatic disease.  Patient  wishes to continue with palliative treatment.  Proceed with cycle 3 of Taxotere and Cyramza with On-Pro Neulasta today.  Patient is also seeking a second opinion at [facility].  Return to clinic in 3 weeks for further evaluation and consideration of cycle 4.  Will reimage at the conclusion of cycle 4.  2.  Anemia: Hemoglobin remains decreased, but stable at 9.0. 3.  Pain: Patient does not complain of pain today.  Continue current narcotic regimen as prescribed. 4.  Palliative care: Appreciate input. 5.  Left shoulder mobility: Appreciate rehab input. 6.  Hypokalemia: Potassium significantly improved.  Continue oral potassium supplementation.  7.  Dysphasia: Unclear etiology.  Swallow study and CT scan of the neck are negative.  Continue follow-up with GI.  MRI of the brain was negative as above.  Patient was also empirically given fluconazole  for possible candidiasis.  8.  Chronic renal insufficiency: Patient's creatinine has trended up to 1.54.  She received an additional 1 L of fluids with treatment today. 9.  Hypomagnesia: Improved. 10.  Bony metastasis: Will hold Zometa today secondary to increased creatinine.  Patient expressed understanding and was in agreement with this plan. She also understands that She can call clinic at any time with any questions, concerns, or complaints.   Cancer Staging Cancer of upper lobe of right lung [place] [facility]) Staging form: Lung, AJCC 8th Edition - Clinical stage from 08/24/2017: Stage IV (cT2a, cN3, cM1c) - Signed by Lloyd Huger, MD on 03/29/2018   Lloyd Huger, MD   01/27/2019 6:20 AM

## 2019-01-26 ENCOUNTER — Inpatient Hospital Stay (HOSPITAL_BASED_OUTPATIENT_CLINIC_OR_DEPARTMENT_OTHER): Payer: BC Managed Care – PPO | Admitting: Oncology

## 2019-01-26 ENCOUNTER — Inpatient Hospital Stay: Payer: BC Managed Care – PPO

## 2019-01-26 ENCOUNTER — Encounter: Payer: Self-pay | Admitting: Oncology

## 2019-01-26 ENCOUNTER — Other Ambulatory Visit: Payer: Self-pay

## 2019-01-26 ENCOUNTER — Inpatient Hospital Stay: Payer: BC Managed Care – PPO | Admitting: Oncology

## 2019-01-26 VITALS — BP 112/79 | HR 102 | Temp 97.1°F | Ht 60.0 in | Wt 100.0 lb

## 2019-01-26 DIAGNOSIS — C3411 Malignant neoplasm of upper lobe, right bronchus or lung: Secondary | ICD-10-CM

## 2019-01-26 DIAGNOSIS — Z803 Family history of malignant neoplasm of breast: Secondary | ICD-10-CM

## 2019-01-26 DIAGNOSIS — Z79899 Other long term (current) drug therapy: Secondary | ICD-10-CM

## 2019-01-26 DIAGNOSIS — E039 Hypothyroidism, unspecified: Secondary | ICD-10-CM

## 2019-01-26 DIAGNOSIS — C7951 Secondary malignant neoplasm of bone: Secondary | ICD-10-CM

## 2019-01-26 DIAGNOSIS — E785 Hyperlipidemia, unspecified: Secondary | ICD-10-CM

## 2019-01-26 DIAGNOSIS — D649 Anemia, unspecified: Secondary | ICD-10-CM

## 2019-01-26 DIAGNOSIS — C787 Secondary malignant neoplasm of liver and intrahepatic bile duct: Secondary | ICD-10-CM | POA: Diagnosis not present

## 2019-01-26 DIAGNOSIS — C3490 Malignant neoplasm of unspecified part of unspecified bronchus or lung: Secondary | ICD-10-CM

## 2019-01-26 DIAGNOSIS — I10 Essential (primary) hypertension: Secondary | ICD-10-CM

## 2019-01-26 DIAGNOSIS — Z923 Personal history of irradiation: Secondary | ICD-10-CM

## 2019-01-26 DIAGNOSIS — N189 Chronic kidney disease, unspecified: Secondary | ICD-10-CM

## 2019-01-26 DIAGNOSIS — E041 Nontoxic single thyroid nodule: Secondary | ICD-10-CM

## 2019-01-26 DIAGNOSIS — E876 Hypokalemia: Secondary | ICD-10-CM

## 2019-01-26 DIAGNOSIS — Z87891 Personal history of nicotine dependence: Secondary | ICD-10-CM

## 2019-01-26 LAB — SAMPLE TO BLOOD BANK

## 2019-01-26 LAB — COMPREHENSIVE METABOLIC PANEL
ALT: 13 U/L (ref 0–44)
AST: 32 U/L (ref 15–41)
Albumin: 3.5 g/dL (ref 3.5–5.0)
Alkaline Phosphatase: 84 U/L (ref 38–126)
Anion gap: 12 (ref 5–15)
BUN: 18 mg/dL (ref 8–23)
CO2: 26 mmol/L (ref 22–32)
Calcium: 9.1 mg/dL (ref 8.9–10.3)
Chloride: 99 mmol/L (ref 98–111)
Creatinine, Ser: 1.54 mg/dL — ABNORMAL HIGH (ref 0.44–1.00)
GFR calc Af Amer: 41 mL/min — ABNORMAL LOW (ref >60)
GFR calc non Af Amer: 36 mL/min — ABNORMAL LOW (ref >60)
Glucose, Bld: 141 mg/dL — ABNORMAL HIGH (ref 70–99)
Potassium: 3.4 mmol/L — ABNORMAL LOW (ref 3.5–5.1)
Sodium: 137 mmol/L (ref 135–145)
Total Bilirubin: 0.3 mg/dL (ref 0.3–1.2)
Total Protein: 7.1 g/dL (ref 6.5–8.1)

## 2019-01-26 LAB — CBC WITH DIFFERENTIAL/PLATELET
Abs Immature Granulocytes: 0.04 10*3/uL (ref 0.00–0.07)
Basophils Absolute: 0.1 10*3/uL (ref 0.0–0.1)
Basophils Relative: 1 %
Eosinophils Absolute: 0 10*3/uL (ref 0.0–0.5)
Eosinophils Relative: 0 %
HCT: 28.5 % — ABNORMAL LOW (ref 36.0–46.0)
Hemoglobin: 9 g/dL — ABNORMAL LOW (ref 12.0–15.0)
Immature Granulocytes: 1 %
Lymphocytes Relative: 15 %
Lymphs Abs: 1 10*3/uL (ref 0.7–4.0)
MCH: 33.2 pg (ref 26.0–34.0)
MCHC: 31.6 g/dL (ref 30.0–36.0)
MCV: 105.2 fL — ABNORMAL HIGH (ref 80.0–100.0)
Monocytes Absolute: 0.4 10*3/uL (ref 0.1–1.0)
Monocytes Relative: 7 %
Neutro Abs: 4.9 10*3/uL (ref 1.7–7.7)
Neutrophils Relative %: 76 %
Platelets: 340 10*3/uL (ref 150–400)
RBC: 2.71 MIL/uL — ABNORMAL LOW (ref 3.87–5.11)
RDW: 16.3 % — ABNORMAL HIGH (ref 11.5–15.5)
WBC: 6.4 10*3/uL (ref 4.0–10.5)
nRBC: 0 % (ref 0.0–0.2)

## 2019-01-26 LAB — MAGNESIUM: Magnesium: 1.7 mg/dL (ref 1.7–2.4)

## 2019-01-26 LAB — PROTEIN, URINE, RANDOM: Total Protein, Urine: 41 mg/dL

## 2019-01-26 MED ORDER — SODIUM CHLORIDE 0.9 % IV SOLN
10.0000 mg/kg | Freq: Once | INTRAVENOUS | Status: AC
  Administered 2019-01-26: 12:00:00 500 mg via INTRAVENOUS
  Filled 2019-01-26: qty 50

## 2019-01-26 MED ORDER — SODIUM CHLORIDE 0.9% FLUSH
10.0000 mL | INTRAVENOUS | Status: DC | PRN
  Administered 2019-01-26: 08:00:00 10 mL via INTRAVENOUS
  Filled 2019-01-26: qty 10

## 2019-01-26 MED ORDER — SODIUM CHLORIDE 0.9 % IV SOLN
75.0000 mg/m2 | Freq: Once | INTRAVENOUS | Status: AC
  Administered 2019-01-26: 110 mg via INTRAVENOUS
  Filled 2019-01-26: qty 11

## 2019-01-26 MED ORDER — SODIUM CHLORIDE 0.9 % IV SOLN
Freq: Once | INTRAVENOUS | Status: AC
  Administered 2019-01-26: 1000 mL via INTRAVENOUS
  Filled 2019-01-26: qty 250

## 2019-01-26 MED ORDER — ACETAMINOPHEN 325 MG PO TABS
650.0000 mg | ORAL_TABLET | Freq: Once | ORAL | Status: AC
  Administered 2019-01-26: 650 mg via ORAL
  Filled 2019-01-26: qty 2

## 2019-01-26 MED ORDER — DIPHENHYDRAMINE HCL 50 MG/ML IJ SOLN
25.0000 mg | Freq: Once | INTRAMUSCULAR | Status: AC
  Administered 2019-01-26: 25 mg via INTRAVENOUS
  Filled 2019-01-26: qty 1

## 2019-01-26 MED ORDER — SODIUM CHLORIDE 0.9 % IV SOLN
Freq: Once | INTRAVENOUS | Status: AC
  Administered 2019-01-26: 10:00:00 via INTRAVENOUS
  Filled 2019-01-26: qty 250

## 2019-01-26 MED ORDER — DEXAMETHASONE SODIUM PHOSPHATE 10 MG/ML IJ SOLN
10.0000 mg | Freq: Once | INTRAMUSCULAR | Status: AC
  Administered 2019-01-26: 10 mg via INTRAVENOUS
  Filled 2019-01-26: qty 1

## 2019-01-26 MED ORDER — PEGFILGRASTIM 6 MG/0.6ML ~~LOC~~ PSKT
6.0000 mg | PREFILLED_SYRINGE | Freq: Once | SUBCUTANEOUS | Status: AC
  Administered 2019-01-26: 6 mg via SUBCUTANEOUS
  Filled 2019-01-26: qty 0.6

## 2019-01-26 MED ORDER — ZOLEDRONIC ACID 4 MG/100ML IV SOLN: 4.0000 mg | Freq: Once | INTRAVENOUS | Status: DC

## 2019-01-26 MED ORDER — HEPARIN SOD (PORK) LOCK FLUSH 100 UNIT/ML IV SOLN
500.0000 [IU] | Freq: Once | INTRAVENOUS | Status: AC
  Administered 2019-01-26: 14:00:00 500 [IU] via INTRAVENOUS
  Filled 2019-01-26: qty 5

## 2019-01-26 NOTE — Progress Notes (Signed)
Patient stated that she continues to have pain in her throat. Patient also stated that she lost her taste buds. Patient's son-Janet [name] would like for [place] to refer her mother to [facility].

## 2019-01-26 NOTE — Progress Notes (Signed)
[place]  Telephone:([phone] [phone]   Name: Janet [name] Date: 01/26/2019 MRN: [id number]  DOB: September 02, 1955  Patient Care Team: Volney American, PA-C as PCP - General (Family Medicine) [name], RN as Registered Nurse    REASON FOR CONSULTATION: Palliative Care consult requested for this 63 y.o. 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  Palliative care was asked to help support patient through treatment and address goals of care.  She was evaluated in our [facility] on 12/31/18 for difficulty swallowing.  She was found to have thrush and treated with Diflucan, Magic mouthwash and liquid potassium d/t chronic hypokalemia. Symptoms improved.   She was evaluated by Dr. Grayland Ormond on 01/12/2019 for electrolyte replacement and continuation of Taxotere and Cyramza.  Plan was for reimaging after 3 cycles.  She was scheduled for follow-up with GI given dysphasia.  Scheduled for swallow study on 02/04/2019.  SOCIAL HISTORY:    Patient is married but separated.  Patient lives alone.  She has a son and daughter.  Patient used to work on an Designer, television/film set.  ADVANCE DIRECTIVES:  Does not have  CODE STATUS: Full code  PAST MEDICAL HISTORY: Past Medical History:  Diagnosis Date  . Arthritis    right shoulder  . Cancer of right lung ([place]) 08/2017   Chemo + rad tx's.   Marland Kitchen Hyperlipidemia   . Hypertension   . Hypothyroidism   . Menopausal state   . Personal history of chemotherapy 2019   lung ca  . Personal history of radiation therapy 2019   lung ca  . Thyroid disease     PAST SURGICAL HISTORY:  Past Surgical History:  Procedure Laterality Date  . ABDOMINAL HYSTERECTOMY  2005  . BREAST EXCISIONAL BIOPSY Right 1979   benign  . COLONOSCOPY WITH PROPOFOL N/A  05/13/2017   Procedure: COLONOSCOPY WITH PROPOFOL;  Surgeon: [name], MD;  Location: [facility] ENDOSCOPY;  Service: Gastroenterology;  Laterality: N/A;  . CYSTECTOMY Right    breast  . CYSTECTOMY  02/2015   back of neck  . ESOPHAGOGASTRODUODENOSCOPY (EGD) WITH PROPOFOL N/A 05/13/2017   Procedure: ESOPHAGOGASTRODUODENOSCOPY (EGD) WITH PROPOFOL;  Surgeon: [name], MD;  Location: [facility] ENDOSCOPY;  Service: Gastroenterology;  Laterality: N/A;  . PORTA CATH INSERTION N/A 09/01/2017   Procedure: PORTA CATH INSERTION;  Surgeon: Algernon Huxley, MD;  Location: [facility] CV LAB;  Service: Cardiovascular;  Laterality: N/A;  . TUBAL LIGATION  1986    HEMATOLOGY/ONCOLOGY HISTORY:  Oncology History  Cancer of upper lobe of right lung ([place])  07/09/2017 Initial Diagnosis   Cancer of upper lobe of right lung ([place])   04/01/2018 - 12/07/2018 Chemotherapy   The patient had palonosetron (ALOXI) injection 0.25 mg, 0.25 mg, Intravenous,  Once, 12 of 18 cycles Administration: 0.25 mg (04/01/2018), 0.25 mg (04/22/2018), 0.25 mg (05/13/2018), 0.25 mg (06/03/2018), 0.25 mg (06/23/2018), 0.25 mg (07/14/2018), 0.25 mg (08/04/2018), 0.25 mg (08/25/2018), 0.25 mg (09/15/2018), 0.25 mg (10/06/2018), 0.25 mg (10/27/2018), 0.25 mg (11/17/2018) bevacizumab (AVASTIN) 900 mg in sodium chloride 0.9 % 100 mL chemo infusion, 925 mg, Intravenous,  Once, 12 of 18 cycles Administration: 900 mg (04/01/2018), 900 mg (04/22/2018), 800 mg (06/03/2018), 800 mg (06/23/2018), 800 mg (07/14/2018), 800 mg (08/04/2018), 800 mg (08/25/2018), 800 mg (09/15/2018), 800 mg (10/06/2018), 800 mg (10/27/2018), 800 mg (11/17/2018) PEMEtrexed (ALIMTA) 800  mg in sodium chloride 0.9 % 100 mL chemo infusion, 800 mg, Intravenous,  Once, 12 of 18 cycles Administration: 800 mg (04/01/2018), 800 mg (04/22/2018), 800 mg (05/13/2018), 800 mg (06/03/2018), 800 mg (06/23/2018), 800 mg (07/14/2018), 800 mg (08/04/2018), 800 mg (08/25/2018), 800 mg (09/15/2018), 800 mg (10/06/2018), 800 mg  (10/27/2018), 800 mg (11/17/2018) CARBOplatin (PARAPLATIN) 480 mg in sodium chloride 0.9 % 250 mL chemo infusion, 480 mg (100 % of original dose 480.5 mg), Intravenous,  Once, 8 of 8 cycles Dose modification:   (original dose 480.5 mg, Cycle 1) Administration: 480 mg (04/01/2018), 480 mg (04/22/2018), 480 mg (05/13/2018), 400 mg (06/03/2018), 440 mg (06/23/2018), 440 mg (07/14/2018), 440 mg (08/04/2018), 430 mg (08/25/2018) fosaprepitant (EMEND) 150 mg, dexamethasone (DECADRON) 12 mg in sodium chloride 0.9 % 145 mL IVPB, , Intravenous,  Once, 12 of 18 cycles Administration:  (04/01/2018),  (04/22/2018),  (05/13/2018),  (06/03/2018),  (06/23/2018),  (07/14/2018),  (08/04/2018),  (08/25/2018),  (09/15/2018),  (10/06/2018),  (10/27/2018),  (11/17/2018)  for chemotherapy treatment.    12/15/2018 -  Chemotherapy   The patient had pegfilgrastim (NEULASTA ONPRO KIT) injection 6 mg, 6 mg, Subcutaneous, Once, 3 of 6 cycles Administration: 6 mg (12/15/2018), 6 mg (01/05/2019) DOCEtaxel (TAXOTERE) 110 mg in sodium chloride 0.9 % 250 mL chemo infusion, 75 mg/m2 = 110 mg, Intravenous,  Once, 3 of 6 cycles Administration: 110 mg (12/15/2018), 110 mg (01/05/2019) ramucirumab (CYRAMZA) 500 mg in sodium chloride 0.9 % 200 mL chemo infusion, 10 mg/kg = 500 mg, Intravenous, Once, 3 of 6 cycles Administration: 500 mg (12/15/2018), 500 mg (01/05/2019)  for chemotherapy treatment.      ALLERGIES:  has No Known Allergies.  MEDICATIONS:  Current Outpatient Medications  Medication Sig Dispense Refill  . amLODipine (NORVASC) 5 MG tablet TAKE 1 TABLET BY MOUTH DAILY (Patient taking differently: Take 5 mg by mouth daily. ) 30 tablet 0  . atorvastatin (LIPITOR) 10 MG tablet TAKE 1 TABLET DAILY AT 6 P.M. (Patient taking differently: Take 10 mg by mouth daily at 6 PM. ) 10 tablet 0  . DULoxetine (CYMBALTA) 30 MG capsule Take 1 capsule (30 mg total) by mouth daily. 30 capsule 3  . fluconazole (DIFLUCAN) 200 MG tablet Take 1 tablet (200 mg total)  by mouth daily. 5 tablet 0  . folic acid (FOLVITE) 1 MG tablet Take 1 tablet (1 mg total) by mouth daily. 90 tablet 4  . levothyroxine (SYNTHROID) 50 MCG tablet TAKE 1 TABLET DAILY 90 tablet 0  . lidocaine-prilocaine (EMLA) cream APPLY EXTERNALLY TO THE AFFECTED AREA 1 TIME 30 g 1  . magic mouthwash w/lidocaine SOLN Take 5-10 mLs by mouth 4 (four) times daily as needed (mouth sores & pain). 480 mL 3  . Oxycodone HCl 10 MG TABS Take 1 tablet (10 mg total) by mouth every 6 (six) hours as needed. 120 tablet 0  . pantoprazole (PROTONIX) 40 MG tablet Take 1 tablet (40 mg total) by mouth daily. 30 tablet 0  . potassium chloride 20 MEQ/15ML (10%) SOLN Take 15 mLs (20 mEq total) by mouth 2 (two) times daily. 473 mL 0   No current facility-administered medications for this visit.    Facility-Administered Medications Ordered in Other Visits  Medication Dose Route Frequency Provider Last Rate Last Dose  . 0.9 %  sodium chloride infusion   Intravenous Once Lloyd Huger, MD 999 mL/hr at 01/26/19 0958 1,000 mL at 01/26/19 0958  . acetaminophen (TYLENOL) tablet 650 mg  650 mg Oral Once Delight Hoh  J, MD      . dexamethasone (DECADRON) injection 10 mg  10 mg Intravenous Once Lloyd Huger, MD      . diphenhydrAMINE (BENADRYL) injection 25 mg  25 mg Intravenous Once Lloyd Huger, MD      . DOCEtaxel (TAXOTERE) 110 mg in sodium chloride 0.9 % 250 mL chemo infusion  75 mg/m2 (Treatment Plan Recorded) Intravenous Once Lloyd Huger, MD      . heparin lock flush 100 unit/mL  500 Units Intravenous Once Lloyd Huger, MD      . pegfilgrastim (NEULASTA ONPRO KIT) injection 6 mg  6 mg Subcutaneous Once Lloyd Huger, MD      . ramucirumab [facility]) 500 mg in sodium chloride 0.9 % 200 mL chemo infusion  10 mg/kg (Treatment Plan Recorded) Intravenous Once Lloyd Huger, MD      . sodium chloride flush (NS) 0.9 % injection 10 mL  10 mL Intravenous PRN Lloyd Huger,  MD   10 mL at 01/26/19 0816  . Zoledronic Acid (ZOMETA) IVPB 4 mg  4 mg Intravenous Once Lloyd Huger, MD        VITAL SIGNS: LMP 08/08/2003 (Approximate) Comment: Hysterectomy 2004 There were no vitals filed for this visit.  Estimated body mass index is 19.53 kg/m as calculated from the following:   Height as of an earlier encounter on 01/26/19: 5' (1.524 m).   Weight as of an earlier encounter on 01/26/19: 100 lb (45.4 kg).  LABS: CBC:    Component Value Date/Time   WBC 6.4 01/26/2019 0816   HGB 9.0 (L) 01/26/2019 0816   HGB 10.8 (L) 07/09/2017 1615   HCT 28.5 (L) 01/26/2019 0816   HCT 32.7 (L) 07/09/2017 1615   PLT 340 01/26/2019 0816   PLT 394 (H) 07/09/2017 1615   MCV 105.2 (H) 01/26/2019 0816   MCV 92 07/09/2017 1615   NEUTROABS 4.9 01/26/2019 0816   NEUTROABS 2.5 07/09/2017 1615   LYMPHSABS 1.0 01/26/2019 0816   LYMPHSABS 3.1 07/09/2017 1615   MONOABS 0.4 01/26/2019 0816   EOSABS 0.0 01/26/2019 0816   EOSABS 0.1 07/09/2017 1615   BASOSABS 0.1 01/26/2019 0816   BASOSABS 0.0 07/09/2017 1615   Comprehensive Metabolic Panel:    Component Value Date/Time   NA 137 01/26/2019 0816   NA 147 (H) 07/09/2017 1615   K 3.4 (L) 01/26/2019 0816   CL 99 01/26/2019 0816   CO2 26 01/26/2019 0816   BUN 18 01/26/2019 0816   BUN 8 07/09/2017 1615   CREATININE 1.54 (H) 01/26/2019 0816   GLUCOSE 141 (H) 01/26/2019 0816   CALCIUM 9.1 01/26/2019 0816   AST 32 01/26/2019 0816   ALT 13 01/26/2019 0816   ALKPHOS 84 01/26/2019 0816   BILITOT 0.3 01/26/2019 0816   BILITOT <0.2 07/09/2017 1615   PROT 7.1 01/26/2019 0816   PROT 6.6 07/09/2017 1615   ALBUMIN 3.5 01/26/2019 0816   ALBUMIN 4.3 07/09/2017 1615    RADIOGRAPHIC STUDIES: Mr Jeri Cos UX Contrast  Result Date: 01/09/2019 CLINICAL DATA:  Dysphagia.  Lung cancer, staging. EXAM: MRI HEAD WITHOUT AND WITH CONTRAST TECHNIQUE: Multiplanar, multiecho pulse sequences of the brain and surrounding structures were obtained  without and with intravenous contrast. CONTRAST:  Gadavist 4 mL. COMPARISON:  CT neck 12/19/2018.  MR head 08/04/2017. FINDINGS: Brain: No acute infarction, hemorrhage, hydrocephalus, extra-axial collection or mass lesion. Normal for age cerebral volume. Minor white matter disease, not unexpected for age. 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 Otherwise, no abnormal enhancement of the brain or meninges. Vascular: Flow voids are maintained throughout the carotid, basilar, and vertebral arteries. There are no areas of chronic hemorrhage. Skull and upper cervical spine: There is slight heterogeneity of the clivus, loss of marrow T1 shortening, on sagittal T1 weighted images, concerning for early skull base metastatic disease. See for instance series 9, image 12. This also represents an interval change in appearance from previous MR 08/04/2017. Changes are more difficult to see on recent CT. Sinuses/Orbits: Negative. Other: Small Thornwaldt cyst, posterior nasopharynx, 3 x 5 mm cross-section. No mastoid fluid. IMPRESSION: MRI of the brain demonstrates no intracranial metastatic disease. Incidental faint enhancement central pons, characteristic of capillary telangiectasia. Abnormal marrow signal in the clivus, best seen on precontrast T1 weighted images, concerning for osseous metastatic disease. Incidental small Thornwaldt cyst, of uncertain significance with regard to dysphagia. Electronically Signed   By: Staci Righter M.D.   On: 01/09/2019 10:03   Dg Esophagus W Double Cm (hd)  Result Date: 12/29/2018 CLINICAL DATA:  Difficulty swallowing. EXAM: ESOPHOGRAM/BARIUM SWALLOW TECHNIQUE: Combined double contrast and single contrast examination performed using effervescent crystals, thick barium liquid, and thin barium liquid. FLUOROSCOPY TIME:  Fluoroscopy Time:  2 minutes 12 seconds Radiation Exposure Index (if provided by the fluoroscopic device): 42.5 mGy  Number of Acquired Spot Images: 48 COMPARISON:  Chest CT 12/04/2018. FINDINGS: Cervical esophagus is widely patent. Tiny anterior cervical web noted. Thoracic esophagus is widely patent pliable. No obstructing lesion noted. Peristalsis was normal. Minimal reflux noted. A barium tablet was not administered due to the patient's difficulty with swallowing. IMPRESSION: Minimal gastroesophageal reflux noted.  Exam otherwise unremarkable. Electronically Signed   By: Marcello Moores  Register   On: 12/29/2018 11:16    PERFORMANCE STATUS (ECOG) : 1 - Symptomatic but completely ambulatory  Review of Systems As noted above. Otherwise, a complete review of systems is negative.  Physical Exam General: NAD, frail appearing, thin Pulmonary: CTA ant fields Cardiac: RRR ABD: soft, nttp Extremities: no edema, no joint deformities Skin: no rashes Neurological: Weakness but otherwise nonfocal  IMPRESSION: I met with patient today in infusion area.  She was evaluated in [facility] in late May and early June for dysphasia.  She was treated and symptoms have essentially resolved.  Had MRI of brain for concerns of metastasis given rapid onset dysphasia which was negative. She continues to have mild dysphasia only with swallowing pills.  She no longer spits up saliva.  She has stopped Magic mouthwash because it made her feel drowsy.  She is able to swallow most foods and liquids at this time.  Occasionally has pills "gets stuck" in her throat but otherwise is doing well.  She is scheduled for a swallow eval next week.  She denies any pain today.  States when she has pain she takes 1 oxycodone with relief.  She is not taking Cymbalta.  She is tolerating her liquid potassium.  Potassium levels have stabilized.  Potassium today is 3.4.  Encouraged her to continue potassium rich foods.  PLAN:  - Continue oxycodone as needed for pain - continue liquid potassium - continue PPI - MOST form on Vynca when patient seen in exam room  - RTC in 3 week  Patient expressed understanding and was in agreement with this plan. She also understands that She can call clinic at any time with any questions, concerns, or complaints.   Time Total: 15 minutes  Visit consisted  of counseling and education dealing with the complex and emotionally intense issues of symptom management and palliative care in the setting of serious and potentially life-threatening illness.Greater than 50%  of this time was spent counseling and coordinating care related to the above assessment and plan.  Signed by: Faythe Casa, NP 01/26/2019 1:49 PM  CC: Dr. Grayland Ormond

## 2019-01-26 NOTE — Progress Notes (Signed)
Creatinine: 1.54. MD, Dr. Grayland Ormond, notified. Per MD order: proceed with scheduled Cyramza and Taxotere treatment today. MD to place an additional order for 0.9% Sodium Chloride infusion to be administered today, see MAR.

## 2019-02-04 ENCOUNTER — Other Ambulatory Visit: Payer: Self-pay

## 2019-02-04 ENCOUNTER — Ambulatory Visit
Admission: RE | Admit: 2019-02-04 | Discharge: 2019-02-04 | Disposition: A | Discharge status: Home / Self Care | Payer: BC Managed Care – PPO | Source: Ambulatory Visit | Attending: Gastroenterology | Admitting: Gastroenterology

## 2019-02-04 DIAGNOSIS — R1310 Dysphagia, unspecified: Secondary | ICD-10-CM | POA: Insufficient documentation

## 2019-02-04 NOTE — Therapy (Signed)
[place], [place], [address] Phone: [phone]   Fax:     Modified Barium Swallow  Patient Details  Name: ASHA GRUMBINE MRN: [id number] Date of Birth: 1955-10-10 No data recorded  Encounter Date: 02/04/2019  End of Session - 02/04/19 1343    Visit Number  1    Number of Visits  1    Date for SLP Re-Evaluation  02/04/19       Past Medical History:  Diagnosis Date  . Arthritis    right shoulder  . Cancer of right lung ([place]) 08/2017   Chemo + rad tx's.   Marland Kitchen Hyperlipidemia   . Hypertension   . Hypothyroidism   . Menopausal state   . Personal history of chemotherapy 2019   lung ca  . Personal history of radiation therapy 2019   lung ca  . Thyroid disease     Past Surgical History:  Procedure Laterality Date  . ABDOMINAL HYSTERECTOMY  2005  . BREAST EXCISIONAL BIOPSY Right 1979   benign  . COLONOSCOPY WITH PROPOFOL N/A 05/13/2017   Procedure: COLONOSCOPY WITH PROPOFOL;  Surgeon: [name], MD;  Location: [facility] ENDOSCOPY;  Service: Gastroenterology;  Laterality: N/A;  . CYSTECTOMY Right    breast  . CYSTECTOMY  02/2015   back of neck  . ESOPHAGOGASTRODUODENOSCOPY (EGD) WITH PROPOFOL N/A 05/13/2017   Procedure: ESOPHAGOGASTRODUODENOSCOPY (EGD) WITH PROPOFOL;  Surgeon: [name], MD;  Location: [facility] ENDOSCOPY;  Service: Gastroenterology;  Laterality: N/A;  . PORTA CATH INSERTION N/A 09/01/2017   Procedure: PORTA CATH INSERTION;  Surgeon: Algernon Huxley, MD;  Location: [facility] CV LAB;  Service: Cardiovascular;  Laterality: N/A;  . TUBAL LIGATION  1986    There were no vitals filed for this visit.       Subjective: Patient behavior: (alertness, ability to follow instructions, etc.):  The patient appears frail.  She is able to express her swallowing complaints and follow directions.  Chief complaint: Patient reports that pills and food get stuck. PMH: 63 y.o. 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     Objective:  Radiological Procedure: A videoflouroscopic evaluation of oral-preparatory, reflex initiation, and pharyngeal phases of the swallow was performed; as well as a screening of the upper esophageal phase.  I. POSTURE: Upright in MBS chair  II. VIEW: Lateral  III. COMPENSATORY STRATEGIES: pill in applesauce  IV. BOLUSES ADMINISTERED:   Thin Liquid: 3 consecutive   Nectar-thick Liquid: 1 moderate   Honey-thick Liquid: DNT   Puree: 1 teaspoon presentation   Mechanical Soft: 1/4 graham cracker in applesauce   Barium tablet: embedded in applesauce  V. RESULTS OF EVALUATION: A. ORAL PREPARATORY PHASE: (The lips, tongue, and velum are observed for strength and coordination)       **Overall Severity Rating: within functional limits; piecemeal swallowing  B. SWALLOW INITIATION/REFLEX: (The reflex is normal if "triggered" by the time the bolus reached the base of the tongue)  **Overall Severity Rating: within normal limits   C. PHARYNGEAL PHASE: (Pharyngeal function is normal if the bolus shows rapid, smooth, and continuous transit through the pharynx and there is no pharyngeal residue after the swallow)  **Overall Severity Rating: within normal limits   D. LARYNGEAL PENETRATION: (Material entering into the laryngeal inlet/vestibule but not aspirated) NONE  E. ASPIRATION: NONE  F.  ESOPHAGEAL PHASE: (Screening of the upper esophagus): barium tablet lodged in the cervical esophagus; no other overt abnormality within the viewable cervical esophagus  ASSESSMENT: This 63 year old woman; with complaint of foods/pills getting stuck; is presenting with functional oropharyngeal swallowing.  With the exception of piecemeal swallowing, oral control of the bolus including oral hold, rotary  mastication, and anterior to posterior transfer is within normal limits.   Timing of pharyngeal swallow initiation is within normal limits. Aspects of the pharyngeal stage of swallowing including tongue base retraction, hyolaryngeal excursion, epiglottic inversion, and duration/amplitude of UES opening are within normal limits.  There is no observed pharyngeal residue, laryngeal penetration, or tracheal aspiration.  The patient is not at significant risk for prandial aspiration.  The patient was able to swallow a barium tablet embedded in applesauce. The tablet lodged within the cervical esophagus.  The patient regurgitated, which expelled the tablet. Recommend liquid medication where possible.  PLAN/RECOMMENDATIONS:   A. Diet: Soft diet recommended for easier swallowing   B. Swallowing Precautions: No whole medications   C. Recommended consultation to: follow up with MDs as recommended   D. Therapy recommendations: speech therapy is not indicated   E. Results and recommendations were discussed with the patient immediately following the study and the final report routed to the referring provider.  1. Dysphagia, unspecified type           Problem List Patient Active Problem List   Diagnosis Date Noted  . Oropharyngeal candidiasis 01/05/2019  . Globus sensation 12/31/2018  . Dysphagia 12/31/2018  . Hypokalemia 12/31/2018  . Goals of care, counseling/discussion 08/25/2017  . Cancer of upper lobe of right lung ([place]) 07/09/2017  . Hypertension 08/04/2015  . Hypothyroidism 08/04/2015  . Hyperlipidemia 08/04/2015  . Osteoma 01/16/2015   Leroy Sea, MS/CCC- SLP  Lou Miner 02/04/2019, 1:44 PM  [facility] [place], [place], [address] Phone: [phone]   Fax:     Name: TANNIE KOSKELA MRN: [id number] Date of Birth: 06-28-1956

## 2019-02-05 DIAGNOSIS — C787 Secondary malignant neoplasm of liver and intrahepatic bile duct: Secondary | ICD-10-CM | POA: Insufficient documentation

## 2019-02-05 DIAGNOSIS — C3492 Malignant neoplasm of unspecified part of left bronchus or lung: Secondary | ICD-10-CM | POA: Insufficient documentation

## 2019-02-14 NOTE — Progress Notes (Signed)
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    INTERVAL HISTORY: Patient returns to clinic today for further evaluation and consideration of cycle 4 of Taxotere and Cyramza.  In the interim she had a second opinion completed at [facility] who agreed with current treatment plan.  She continues to have chronic weakness and fatigue. She denies any pain. She has no neurologic complaints.  She denies any recent fevers or illnesses. She denies any chest pain, shortness of breath, hemoptysis, or cough.  She has no nausea, vomiting, constipation, or diarrhea.  She has no melena or hematochezia.  She has no urinary complaints.  Patient offers no further specific complaints today.  REVIEW OF SYSTEMS:   Review of Systems  Constitutional: Positive for malaise/fatigue and weight loss. Negative for fever.  HENT:       Dysphasia.  Respiratory: Negative.  Negative for cough and shortness of breath.   Cardiovascular: Negative.  Negative for chest pain and leg swelling.  Gastrointestinal: Negative.  Negative for abdominal pain, blood in stool and melena.  Genitourinary: Negative.  Negative for dysuria and flank pain.  Musculoskeletal: Negative.  Negative for back pain and joint pain.  Skin: Negative.  Negative for rash.  Neurological: Positive for weakness. Negative for sensory change, focal weakness and headaches.  Psychiatric/Behavioral: Negative.  Negative for depression. The patient is not nervous/anxious.     As per HPI. Otherwise, a complete review of systems is negative.  PAST MEDICAL HISTORY: Past Medical History:  Diagnosis Date  . Arthritis    right  shoulder  . Cancer of right lung ([place]) 08/2017   Chemo + rad tx's.   Marland Kitchen Hyperlipidemia   . Hypertension   . Hypothyroidism   . Menopausal state   . Personal history of chemotherapy 2019   lung ca  . Personal history of radiation therapy 2019   lung ca  . Thyroid disease     PAST SURGICAL HISTORY: Past Surgical History:  Procedure Laterality Date  . ABDOMINAL HYSTERECTOMY  2005  . BREAST EXCISIONAL BIOPSY Right 1979   benign  . COLONOSCOPY WITH PROPOFOL N/A 05/13/2017   Procedure: COLONOSCOPY WITH PROPOFOL;  Surgeon: [name], MD;  Location: [facility] ENDOSCOPY;  Service: Gastroenterology;  Laterality: N/A;  . CYSTECTOMY Right    breast  . CYSTECTOMY  02/2015   back of neck  . ESOPHAGOGASTRODUODENOSCOPY (EGD) WITH PROPOFOL N/A 05/13/2017   Procedure: ESOPHAGOGASTRODUODENOSCOPY (EGD) WITH PROPOFOL;  Surgeon: [name], MD;  Location: [facility] ENDOSCOPY;  Service: Gastroenterology;  Laterality: N/A;  . PORTA CATH INSERTION N/A 09/01/2017   Procedure: PORTA CATH INSERTION;  Surgeon: Algernon Huxley, MD;  Location: [place] [facility] LAB;  Service: Cardiovascular;  Laterality: N/A;  . TUBAL LIGATION  1986    FAMILY HISTORY: Family History  Problem Relation Age of Onset  . Hypertension Mother   . Stroke Mother   . Leukemia Mother   . Stroke Father   . Pneumonia Father   . Diabetes Sister   . Hyperlipidemia Sister   . Breast cancer Maternal Aunt 28    ADVANCED DIRECTIVES (Y/N):  N  HEALTH MAINTENANCE: Social History   Tobacco Use  . Smoking status: Former Smoker  Packs/day: 0.25    Types: Cigarettes    Quit date: 02/01/2017    Years since quitting: 2.0  . Smokeless tobacco: Never Used  Substance Use Topics  . Alcohol use: No    Alcohol/week: 0.0 standard drinks  . Drug use: No     Colonoscopy:  PAP:  Bone density:  Lipid panel:  No Known Allergies  Current Outpatient Medications  Medication Sig Dispense Refill  . amLODipine (NORVASC) 5 MG tablet TAKE 1 TABLET BY  MOUTH DAILY (Patient taking differently: Take 5 mg by mouth daily. ) 30 tablet 0  . DULoxetine (CYMBALTA) 30 MG capsule Take 1 capsule (30 mg total) by mouth daily. 30 capsule 3  . fluconazole (DIFLUCAN) 200 MG tablet Take 1 tablet (200 mg total) by mouth daily. 5 tablet 0  . folic acid (FOLVITE) 1 MG tablet Take 1 tablet (1 mg total) by mouth daily. 90 tablet 4  . levothyroxine (SYNTHROID) 50 MCG tablet TAKE 1 TABLET DAILY 90 tablet 0  . lidocaine-prilocaine (EMLA) cream APPLY EXTERNALLY TO THE AFFECTED AREA 1 TIME 30 g 1  . magic mouthwash w/lidocaine SOLN Take 5-10 mLs by mouth 4 (four) times daily as needed (mouth sores & pain). 480 mL 3  . Oxycodone HCl 10 MG TABS Take 1 tablet (10 mg total) by mouth every 6 (six) hours as needed. 120 tablet 0  . pantoprazole (PROTONIX) 40 MG tablet Take 1 tablet (40 mg total) by mouth daily. 30 tablet 0  . potassium chloride 20 MEQ/15ML (10%) SOLN Take 15 mLs (20 mEq total) by mouth 2 (two) times daily. 473 mL 0   No current facility-administered medications for this visit.     OBJECTIVE: Vitals:   02/16/19 0849  BP: 111/78  Pulse: 95  Resp: 18     Body mass index is 18.73 kg/m.    ECOG FS:2 - Symptomatic, <50% confined to bed  General: Thin, no acute distress.  Sitting in a wheelchair. Eyes: Pink conjunctiva, anicteric sclera. HEENT: Normocephalic, moist mucous membranes, clear oropharnyx. Lungs: Clear to auscultation bilaterally. Heart: Regular rate and rhythm. No rubs, murmurs, or gallops. Abdomen: Soft, nontender, nondistended. No organomegaly noted, normoactive bowel sounds. Musculoskeletal: No edema, cyanosis, or clubbing. Neuro: Alert, answering all questions appropriately. Cranial nerves grossly intact. Skin: No rashes or petechiae noted. Psych: Normal affect.  LAB RESULTS:  Lab Results  Component Value Date   NA 137 02/16/2019   K 3.6 02/16/2019   CL 98 02/16/2019   CO2 27 02/16/2019   GLUCOSE 144 (H) 02/16/2019   BUN 18  02/16/2019   CREATININE 1.29 (H) 02/16/2019   CALCIUM 9.2 02/16/2019   PROT 7.0 02/16/2019   ALBUMIN 3.3 (L) 02/16/2019   AST 31 02/16/2019   ALT 12 02/16/2019   ALKPHOS 80 02/16/2019   BILITOT 0.4 02/16/2019   GFRNONAA 44 (L) 02/16/2019   GFRAA 51 (L) 02/16/2019    Lab Results  Component Value Date   WBC 7.0 02/16/2019   NEUTROABS 5.3 02/16/2019   HGB 9.3 (L) 02/16/2019   HCT 29.4 (L) 02/16/2019   MCV 105.4 (H) 02/16/2019   PLT 307 02/16/2019     STUDIES: Dg Swallow Func Speech Path  Result Date: 02/15/2019 Please refer to "Notes" tab for Speech Pathology notes.   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     PLAN:    1. Progressive stage IV adenocarcinoma of the lung with metastatic disease in bones and  liver: Previously, OmniSeq testing did not reveal any actionable mutations.  CT scan results from Dec 04, 2018 reviewed independently with mild progression of pulmonary and adrenal metastasis, and significant progression of liver metastasis.  Repeat MRI of her brain on January 09, 2019 did not reveal any metastatic disease.  Patient wishes to continue with palliative treatment.  Proceed with cycle 4 of Taxotere and Cyramza today.  Patient also received Neulasta support.  Return to clinic in 3 weeks for further evaluation and consideration of cycle 5.  Will reimage 1 to 2 days prior to her next treatment.    2.  Anemia: Hemoglobin remains decreased, but stable at 9.3. 3.  Pain: Patient does not complain of pain today.  Continue current narcotic regimen as prescribed. 4.  Palliative care: Appreciate input. 5.  Left shoulder mobility: Appreciate rehab input. 6.  Hypokalemia: Resolved.  Continue oral potassium supplementation.  7.  Dysphasia: Unclear etiology.  Swallow study and CT scan of the neck are negative.  Continue follow-up with GI.  MRI of the brain was negative as above.  Patient was also empirically given  fluconazole for possible candidiasis.  8.  Chronic renal insufficiency: Creatinine improved to 1.29.  Monitor. 9.  Hypomagnesia: Resolved. 10.  Bony metastasis: Proceed with Zometa today.  Patient expressed understanding and was in agreement with this plan. She also understands that She can call clinic at any time with any questions, concerns, or complaints.   Cancer Staging Cancer of upper lobe of right lung [facility]) Staging form: Lung, AJCC 8th Edition - Clinical stage from 08/24/2017: Stage IV (cT2a, cN3, cM1c) - Signed by Lloyd Huger, MD on 03/29/2018   Lloyd Huger, MD   02/17/2019 1:09 PM

## 2019-02-16 ENCOUNTER — Encounter: Payer: Self-pay | Admitting: Oncology

## 2019-02-16 ENCOUNTER — Other Ambulatory Visit: Payer: Self-pay

## 2019-02-16 ENCOUNTER — Inpatient Hospital Stay: Payer: Self-pay | Attending: Oncology

## 2019-02-16 ENCOUNTER — Encounter: Payer: Self-pay | Admitting: Pharmacy Technician

## 2019-02-16 ENCOUNTER — Inpatient Hospital Stay: Payer: Self-pay

## 2019-02-16 ENCOUNTER — Inpatient Hospital Stay (HOSPITAL_BASED_OUTPATIENT_CLINIC_OR_DEPARTMENT_OTHER): Payer: Self-pay | Admitting: Oncology

## 2019-02-16 VITALS — BP 111/78 | HR 95 | Resp 18 | Wt 95.9 lb

## 2019-02-16 VITALS — Temp 97.0°F

## 2019-02-16 DIAGNOSIS — Z923 Personal history of irradiation: Secondary | ICD-10-CM

## 2019-02-16 DIAGNOSIS — Z87891 Personal history of nicotine dependence: Secondary | ICD-10-CM | POA: Insufficient documentation

## 2019-02-16 DIAGNOSIS — C7801 Secondary malignant neoplasm of right lung: Secondary | ICD-10-CM | POA: Insufficient documentation

## 2019-02-16 DIAGNOSIS — I10 Essential (primary) hypertension: Secondary | ICD-10-CM

## 2019-02-16 DIAGNOSIS — C3411 Malignant neoplasm of upper lobe, right bronchus or lung: Secondary | ICD-10-CM

## 2019-02-16 DIAGNOSIS — E785 Hyperlipidemia, unspecified: Secondary | ICD-10-CM

## 2019-02-16 DIAGNOSIS — N189 Chronic kidney disease, unspecified: Secondary | ICD-10-CM

## 2019-02-16 DIAGNOSIS — Z9221 Personal history of antineoplastic chemotherapy: Secondary | ICD-10-CM

## 2019-02-16 DIAGNOSIS — C7951 Secondary malignant neoplasm of bone: Secondary | ICD-10-CM

## 2019-02-16 DIAGNOSIS — D649 Anemia, unspecified: Secondary | ICD-10-CM | POA: Insufficient documentation

## 2019-02-16 DIAGNOSIS — E039 Hypothyroidism, unspecified: Secondary | ICD-10-CM

## 2019-02-16 DIAGNOSIS — C787 Secondary malignant neoplasm of liver and intrahepatic bile duct: Secondary | ICD-10-CM

## 2019-02-16 DIAGNOSIS — C3490 Malignant neoplasm of unspecified part of unspecified bronchus or lung: Secondary | ICD-10-CM

## 2019-02-16 DIAGNOSIS — Z79899 Other long term (current) drug therapy: Secondary | ICD-10-CM

## 2019-02-16 DIAGNOSIS — Z5111 Encounter for antineoplastic chemotherapy: Secondary | ICD-10-CM | POA: Insufficient documentation

## 2019-02-16 DIAGNOSIS — Z803 Family history of malignant neoplasm of breast: Secondary | ICD-10-CM

## 2019-02-16 DIAGNOSIS — Z5112 Encounter for antineoplastic immunotherapy: Secondary | ICD-10-CM | POA: Insufficient documentation

## 2019-02-16 LAB — COMPREHENSIVE METABOLIC PANEL
ALT: 12 U/L (ref 0–44)
AST: 31 U/L (ref 15–41)
Albumin: 3.3 g/dL — ABNORMAL LOW (ref 3.5–5.0)
Alkaline Phosphatase: 80 U/L (ref 38–126)
Anion gap: 12 (ref 5–15)
BUN: 18 mg/dL (ref 8–23)
CO2: 27 mmol/L (ref 22–32)
Calcium: 9.2 mg/dL (ref 8.9–10.3)
Chloride: 98 mmol/L (ref 98–111)
Creatinine, Ser: 1.29 mg/dL — ABNORMAL HIGH (ref 0.44–1.00)
GFR calc Af Amer: 51 mL/min — ABNORMAL LOW (ref >60)
GFR calc non Af Amer: 44 mL/min — ABNORMAL LOW (ref >60)
Glucose, Bld: 144 mg/dL — ABNORMAL HIGH (ref 70–99)
Potassium: 3.6 mmol/L (ref 3.5–5.1)
Sodium: 137 mmol/L (ref 135–145)
Total Bilirubin: 0.4 mg/dL (ref 0.3–1.2)
Total Protein: 7 g/dL (ref 6.5–8.1)

## 2019-02-16 LAB — CBC WITH DIFFERENTIAL/PLATELET
Abs Immature Granulocytes: 0.04 10*3/uL (ref 0.00–0.07)
Basophils Absolute: 0 10*3/uL (ref 0.0–0.1)
Basophils Relative: 1 %
Eosinophils Absolute: 0 10*3/uL (ref 0.0–0.5)
Eosinophils Relative: 0 %
HCT: 29.4 % — ABNORMAL LOW (ref 36.0–46.0)
Hemoglobin: 9.3 g/dL — ABNORMAL LOW (ref 12.0–15.0)
Immature Granulocytes: 1 %
Lymphocytes Relative: 12 %
Lymphs Abs: 0.9 10*3/uL (ref 0.7–4.0)
MCH: 33.3 pg (ref 26.0–34.0)
MCHC: 31.6 g/dL (ref 30.0–36.0)
MCV: 105.4 fL — ABNORMAL HIGH (ref 80.0–100.0)
Monocytes Absolute: 0.7 10*3/uL (ref 0.1–1.0)
Monocytes Relative: 10 %
Neutro Abs: 5.3 10*3/uL (ref 1.7–7.7)
Neutrophils Relative %: 76 %
Platelets: 307 10*3/uL (ref 150–400)
RBC: 2.79 MIL/uL — ABNORMAL LOW (ref 3.87–5.11)
RDW: 15.9 % — ABNORMAL HIGH (ref 11.5–15.5)
WBC: 7 10*3/uL (ref 4.0–10.5)
nRBC: 0 % (ref 0.0–0.2)

## 2019-02-16 LAB — MAGNESIUM: Magnesium: 2 mg/dL (ref 1.7–2.4)

## 2019-02-16 LAB — SAMPLE TO BLOOD BANK

## 2019-02-16 LAB — PROTEIN, URINE, RANDOM: Total Protein, Urine: 38 mg/dL

## 2019-02-16 MED ORDER — SODIUM CHLORIDE 0.9 % IV SOLN
430.0000 mg | Freq: Once | INTRAVENOUS | Status: AC
  Administered 2019-02-16: 11:00:00 430 mg via INTRAVENOUS
  Filled 2019-02-16: qty 43

## 2019-02-16 MED ORDER — ACETAMINOPHEN 160 MG/5ML PO SOLN
650.0000 mg | Freq: Once | ORAL | Status: AC
  Administered 2019-02-16: 650 mg via ORAL
  Filled 2019-02-16: qty 20.3

## 2019-02-16 MED ORDER — SODIUM CHLORIDE 0.9 % IV SOLN
Freq: Once | INTRAVENOUS | Status: AC
  Administered 2019-02-16: 10:00:00 via INTRAVENOUS
  Filled 2019-02-16: qty 250

## 2019-02-16 MED ORDER — DIPHENHYDRAMINE HCL 50 MG/ML IJ SOLN
25.0000 mg | Freq: Once | INTRAMUSCULAR | Status: AC
  Administered 2019-02-16: 11:00:00 25 mg via INTRAVENOUS
  Filled 2019-02-16: qty 1

## 2019-02-16 MED ORDER — SODIUM CHLORIDE 0.9 % IV SOLN
75.0000 mg/m2 | Freq: Once | INTRAVENOUS | Status: AC
  Administered 2019-02-16: 110 mg via INTRAVENOUS
  Filled 2019-02-16: qty 11

## 2019-02-16 MED ORDER — ACETAMINOPHEN 160 MG/5ML PO SUSP: 650.0000 mg | Freq: Once | ORAL | Status: DC

## 2019-02-16 MED ORDER — ACETAMINOPHEN 325 MG PO TABS
650.0000 mg | ORAL_TABLET | Freq: Once | ORAL | Status: DC
  Filled 2019-02-16: qty 2

## 2019-02-16 MED ORDER — DEXAMETHASONE SODIUM PHOSPHATE 10 MG/ML IJ SOLN
10.0000 mg | Freq: Once | INTRAMUSCULAR | Status: AC
  Administered 2019-02-16: 10 mg via INTRAVENOUS
  Filled 2019-02-16: qty 1

## 2019-02-16 MED ORDER — HEPARIN SOD (PORK) LOCK FLUSH 100 UNIT/ML IV SOLN
500.0000 [IU] | Freq: Once | INTRAVENOUS | Status: AC | PRN
  Administered 2019-02-16: 500 [IU]
  Filled 2019-02-16: qty 5

## 2019-02-16 MED ORDER — SODIUM CHLORIDE 0.9% FLUSH
10.0000 mL | Freq: Once | INTRAVENOUS | Status: AC
  Administered 2019-02-16: 10 mL via INTRAVENOUS
  Filled 2019-02-16: qty 10

## 2019-02-16 MED ORDER — ZOLEDRONIC ACID 4 MG/100ML IV SOLN
4.0000 mg | Freq: Once | INTRAVENOUS | Status: AC
  Administered 2019-02-16: 4 mg via INTRAVENOUS
  Filled 2019-02-16: qty 100

## 2019-02-16 MED ORDER — PEGFILGRASTIM 6 MG/0.6ML ~~LOC~~ PSKT
6.0000 mg | PREFILLED_SYRINGE | Freq: Once | SUBCUTANEOUS | Status: AC
  Administered 2019-02-16: 6 mg via SUBCUTANEOUS
  Filled 2019-02-16: qty 0.6

## 2019-02-16 NOTE — Progress Notes (Signed)
Cyramza dose decreased to 430mg  due to weightloss and calculated dose more than 10% from previous dose per MD.

## 2019-02-16 NOTE — Progress Notes (Signed)
Patient denies any concerns today.  

## 2019-02-16 NOTE — Progress Notes (Signed)
Patient has been approved for drug assistance by [employer] for Cyramza. The enrollment period is from 02/12/19-02/11/20 based on self pay. First DOS covered is 02/16/19.

## 2019-02-18 ENCOUNTER — Encounter: Payer: Self-pay | Admitting: Pharmacy Technician

## 2019-02-18 NOTE — Progress Notes (Signed)
Patient has been approved for drug assistance by [employer] for Neulasta. The enrollment period is from 02/16/19-02/16/20 based on self pay. First DOS covered is 02/16/19.

## 2019-03-04 ENCOUNTER — Other Ambulatory Visit: Payer: Self-pay

## 2019-03-04 ENCOUNTER — Ambulatory Visit
Admission: RE | Admit: 2019-03-04 | Discharge: 2019-03-04 | Disposition: A | Discharge status: Home / Self Care | Payer: Self-pay | Source: Ambulatory Visit | Attending: Oncology | Admitting: Oncology

## 2019-03-04 DIAGNOSIS — C3411 Malignant neoplasm of upper lobe, right bronchus or lung: Secondary | ICD-10-CM | POA: Insufficient documentation

## 2019-03-04 MED ORDER — IOHEXOL 300 MG/ML  SOLN
75.0000 mL | Freq: Once | INTRAMUSCULAR | Status: AC | PRN
  Administered 2019-03-04: 12:00:00 75 mL via INTRAVENOUS

## 2019-03-07 NOTE — Progress Notes (Signed)
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    INTERVAL HISTORY: Patient returns to clinic today for further evaluation, discussion of her imaging results, and consideration of cycle 5 of Taxotere and Cyramza.  She continues to have chronic weakness and fatigue, but otherwise feels well.  She continues to have a poor appetite, but her weight has remained relatively stable.  She denies any pain. She has no neurologic complaints.  She denies any recent fevers or illnesses. She denies any chest pain, shortness of breath, hemoptysis, or cough.  She has no nausea, vomiting, constipation, or diarrhea.  She has no melena or hematochezia.  She has no urinary complaints.  Patient offers no further specific complaints today.  REVIEW OF SYSTEMS:   Review of Systems  Constitutional: Positive for malaise/fatigue. Negative for fever and weight loss.  Respiratory: Negative.  Negative for cough and shortness of breath.   Cardiovascular: Negative.  Negative for chest pain and leg swelling.  Gastrointestinal: Negative.  Negative for abdominal pain, blood in stool and melena.  Genitourinary: Negative.  Negative for dysuria and flank pain.  Musculoskeletal: Negative.  Negative for back pain and joint pain.  Skin: Negative.  Negative for rash.  Neurological: Positive for weakness. Negative for dizziness, sensory change, focal weakness and headaches.  Psychiatric/Behavioral: Negative.  Negative for depression. The patient is not nervous/anxious.     As per HPI. Otherwise, a complete review of systems is negative.  PAST MEDICAL HISTORY: Past Medical History:  Diagnosis Date    Arthritis    right shoulder   Cancer of right lung ([place]) 08/2017   Chemo + rad tx's.    Hyperlipidemia    Hypertension    Hypothyroidism    Menopausal state    Personal history of chemotherapy 2019   lung ca   Personal history of radiation therapy 2019   lung ca   Thyroid disease     PAST SURGICAL HISTORY: Past Surgical History:  Procedure Laterality Date   ABDOMINAL HYSTERECTOMY  2005   BREAST EXCISIONAL BIOPSY Right 1979   benign   COLONOSCOPY WITH PROPOFOL N/A 05/13/2017   Procedure: COLONOSCOPY WITH PROPOFOL;  Surgeon: [name], MD;  Location: [facility] ENDOSCOPY;  Service: Gastroenterology;  Laterality: N/A;   CYSTECTOMY Right    breast   CYSTECTOMY  02/2015   back of neck   ESOPHAGOGASTRODUODENOSCOPY (EGD) WITH PROPOFOL N/A 05/13/2017   Procedure: ESOPHAGOGASTRODUODENOSCOPY (EGD) WITH PROPOFOL;  Surgeon: [name], MD;  Location: [facility] ENDOSCOPY;  Service: Gastroenterology;  Laterality: N/A;   PORTA CATH INSERTION N/A 09/01/2017   Procedure: PORTA CATH INSERTION;  Surgeon: Algernon Huxley, MD;  Location: [place] CV LAB;  Service: Cardiovascular;  Laterality: N/A;   TUBAL LIGATION  1986    FAMILY HISTORY: Family History  Problem Relation Age of Onset   Hypertension Mother    Stroke Mother    Leukemia Mother    Stroke Father    Pneumonia Father    Diabetes Sister    Hyperlipidemia Sister    Breast cancer Maternal Aunt 10    ADVANCED DIRECTIVES (Y/N):  N  HEALTH MAINTENANCE: Social History   Tobacco Use   Smoking status: Former Smoker    Packs/day:  0.25    Types: Cigarettes    Quit date: 02/01/2017    Years since quitting: 2.0   Smokeless tobacco: Never Used  Substance Use Topics   Alcohol use: No    Alcohol/week: 0.0 standard drinks   Drug use: No     Colonoscopy:  PAP:  Bone density:  Lipid panel:  No Known Allergies  Current Outpatient Medications  Medication Sig Dispense Refill   amLODipine (NORVASC) 5 MG  tablet TAKE 1 TABLET BY MOUTH DAILY (Patient taking differently: Take 5 mg by mouth daily. ) 30 tablet 0   DULoxetine (CYMBALTA) 30 MG capsule Take 1 capsule (30 mg total) by mouth daily. 30 capsule 3   fluconazole (DIFLUCAN) 200 MG tablet Take 1 tablet (200 mg total) by mouth daily. 5 tablet 0   folic acid (FOLVITE) 1 MG tablet Take 1 tablet (1 mg total) by mouth daily. 90 tablet 4   levothyroxine (SYNTHROID) 50 MCG tablet TAKE 1 TABLET DAILY 90 tablet 0   lidocaine-prilocaine (EMLA) cream APPLY EXTERNALLY TO THE AFFECTED AREA 1 TIME 30 g 1   magic mouthwash w/lidocaine SOLN Take 5-10 mLs by mouth 4 (four) times daily as needed (mouth sores & pain). 480 mL 3   Oxycodone HCl 10 MG TABS Take 1 tablet (10 mg total) by mouth every 6 (six) hours as needed. 120 tablet 0   pantoprazole (PROTONIX) 40 MG tablet Take 1 tablet (40 mg total) by mouth daily. 30 tablet 0   potassium chloride 20 MEQ/15ML (10%) SOLN Take 15 mLs (20 mEq total) by mouth 2 (two) times daily. 473 mL 0   No current facility-administered medications for this visit.    Facility-Administered Medications Ordered in Other Visits  Medication Dose Route Frequency Provider Last Rate Last Dose   [COMPLETED] heparin lock flush 100 unit/mL  500 Units Intravenous Once Lloyd Huger, MD   500 Units at 03/09/19 1253   sodium chloride flush (NS) 0.9 % injection 10 mL  10 mL Intravenous PRN Lloyd Huger, MD   10 mL at 03/09/19 0830    OBJECTIVE: Vitals:   03/09/19 0848  BP: 110/68  Pulse: 94  Resp: 18  Temp: (!) 96.1 F (35.6 C)     Body mass index is 18.51 kg/m.    ECOG FS:2 - Symptomatic, <50% confined to bed  General: Thin, no acute distress.  Sitting in wheelchair. Eyes: Pink conjunctiva, anicteric sclera. HEENT: Normocephalic, moist mucous membranes, clear oropharnyx. Lungs: Clear to auscultation bilaterally. Heart: Regular rate and rhythm. No rubs, murmurs, or gallops. Abdomen: Soft, nontender,  nondistended. No organomegaly noted, normoactive bowel sounds. Musculoskeletal: No edema, cyanosis, or clubbing. Neuro: Alert, answering all questions appropriately. Cranial nerves grossly intact. Skin: No rashes or petechiae noted. Psych: Normal affect.  LAB RESULTS:  Lab Results  Component Value Date   NA 134 (L) 03/09/2019   K 3.3 (L) 03/09/2019   CL 95 (L) 03/09/2019   CO2 28 03/09/2019   GLUCOSE 165 (H) 03/09/2019   BUN 23 03/09/2019   CREATININE 1.64 (H) 03/09/2019   CALCIUM 9.4 03/09/2019   PROT 6.8 03/09/2019   ALBUMIN 3.5 03/09/2019   AST 31 03/09/2019   ALT 10 03/09/2019   ALKPHOS 75 03/09/2019   BILITOT 0.3 03/09/2019   GFRNONAA 33 (L) 03/09/2019   GFRAA 38 (L) 03/09/2019    Lab Results  Component Value Date   WBC 7.8 03/09/2019   NEUTROABS 6.1 03/09/2019   HGB 9.7 (L) 03/09/2019   HCT  30.2 (L) 03/09/2019   MCV 103.8 (H) 03/09/2019   PLT 267 03/09/2019     STUDIES: Ct Chest W Contrast  Result Date: 03/04/2019 CLINICAL DATA:  Progressive stage IV adenocarcinoma of the lung, metastatic disease to the skeleton and liver, surveillance and restaging. EXAM: CT CHEST, ABDOMEN, AND PELVIS WITH CONTRAST TECHNIQUE: Multidetector CT imaging of the chest, abdomen and pelvis was performed following the standard protocol during bolus administration of intravenous contrast. CONTRAST:  43mL OMNIPAQUE IOHEXOL 300 MG/ML  SOLN COMPARISON:  Multiple exams, including 12/04/2018 FINDINGS: CT CHEST FINDINGS Cardiovascular: Left Port-A-Cath tip: SVC. Atherosclerotic calcification of the aortic arch and branch vessels. Mediastinum/Nodes: No pathologic thoracic adenopathy is identified. Lungs/Pleura: Right paramediastinal volume loss and bandlike opacity probably from radiation fibrosis. In a central area of clearing medially in the left upper lobe, spiculated density which could be therapy related or a true pulmonary nodule measures 0.9 by 0.8 cm, stable. Partially cavitary 1.1 by 0.9 cm  right lower lobe pulmonary nodule on image 83/4, previously 1.0 by 0.8 cm. The other scattered generally small pulmonary nodules are variable, with most of the nodules appearing slightly larger than on the prior exam, although with an occasional nodule measuring smaller. For example the nodule along the left hemidiaphragm is smaller, measuring 0.6 by 0.5 cm on image 91/4, previously 0.8 by 0.6 cm. Musculoskeletal: Mild sclerosis of the right anterior seventh rib and, indistinct and essentially stable. CT ABDOMEN PELVIS FINDINGS Hepatobiliary: Irregular primarily hypodense lesion posteriorly in the right hepatic lobe measures 2.9 by 2.0 cm on image 41/2, formerly 2.9 by 1.9 cm, essentially stable. The hypodense lesion below the gallbladder fossa measures 1.9 by 1.5 cm on image 45/2, formerly 2.5 by 2.3 cm (improved). No new hepatic lesions are identified. Gallbladder unremarkable. Pancreas: Unremarkable Spleen: Unremarkable Adrenals/Urinary Tract: The medial limb of the left adrenal gland has previously been hypermetabolic and thickened. Currently the medial limb measures 6 mm in thickness, formerly 8 mm on 12/04/2018. Stomach/Bowel: 4 cm jejuno jejunal intussusception in the left upper quadrant as shown on image 56/5. I do see an obvious cause/lead point. No obstruction. Vascular/Lymphatic: Aortoiliac atherosclerotic vascular disease. Reproductive: Uterus absent.  Adnexa unremarkable. Other: No supplemental non-categorized findings. Musculoskeletal: Subtle sclerosis along the iliac crests, left pubic rami, and right superior pubic ramus compatible with prior osseous metastatic disease, not significantly changed from prior CT, and much more conspicuous on bone scan. IMPRESSION: 1. Mixed appearance of the pulmonary nodules, although generally mildly enlarged compared to the prior exam. At least 2 of the pulmonary nodules have reduced in size. 2. Generally improved appearance of the 2 metastatic lesions in the liver,  with both reduced intensity and with 1 of the lesions significantly reduced in size. 3. Stable very subtle sclerotic osseous metastatic lesions involving the bony pelvis and right anterior seventh rib. These lesions are more conspicuous at bone scan. 4. New 4 cm in length jejuno jejunal intussusception in the left upper quadrant, without a visible lead point. No dilated bowel to suggest obstruction. 5. Mild thickening of the medial limb of the left adrenal gland at 0.6 cm, previously 0.8 cm, suggests improving metastatic lesion. 6. Aortic Atherosclerosis (ICD10-I70.0). Electronically Signed   By: Van Clines M.D.   On: 03/04/2019 14:21   Ct Abdomen Pelvis W Contrast  Result Date: 03/04/2019 CLINICAL DATA:  Progressive stage IV adenocarcinoma of the lung, metastatic disease to the skeleton and liver, surveillance and restaging. EXAM: CT CHEST, ABDOMEN, AND PELVIS WITH CONTRAST TECHNIQUE: Multidetector CT  imaging of the chest, abdomen and pelvis was performed following the standard protocol during bolus administration of intravenous contrast. CONTRAST:  37mL OMNIPAQUE IOHEXOL 300 MG/ML  SOLN COMPARISON:  Multiple exams, including 12/04/2018 FINDINGS: CT CHEST FINDINGS Cardiovascular: Left Port-A-Cath tip: SVC. Atherosclerotic calcification of the aortic arch and branch vessels. Mediastinum/Nodes: No pathologic thoracic adenopathy is identified. Lungs/Pleura: Right paramediastinal volume loss and bandlike opacity probably from radiation fibrosis. In a central area of clearing medially in the left upper lobe, spiculated density which could be therapy related or a true pulmonary nodule measures 0.9 by 0.8 cm, stable. Partially cavitary 1.1 by 0.9 cm right lower lobe pulmonary nodule on image 83/4, previously 1.0 by 0.8 cm. The other scattered generally small pulmonary nodules are variable, with most of the nodules appearing slightly larger than on the prior exam, although with an occasional nodule measuring  smaller. For example the nodule along the left hemidiaphragm is smaller, measuring 0.6 by 0.5 cm on image 91/4, previously 0.8 by 0.6 cm. Musculoskeletal: Mild sclerosis of the right anterior seventh rib and, indistinct and essentially stable. CT ABDOMEN PELVIS FINDINGS Hepatobiliary: Irregular primarily hypodense lesion posteriorly in the right hepatic lobe measures 2.9 by 2.0 cm on image 41/2, formerly 2.9 by 1.9 cm, essentially stable. The hypodense lesion below the gallbladder fossa measures 1.9 by 1.5 cm on image 45/2, formerly 2.5 by 2.3 cm (improved). No new hepatic lesions are identified. Gallbladder unremarkable. Pancreas: Unremarkable Spleen: Unremarkable Adrenals/Urinary Tract: The medial limb of the left adrenal gland has previously been hypermetabolic and thickened. Currently the medial limb measures 6 mm in thickness, formerly 8 mm on 12/04/2018. Stomach/Bowel: 4 cm jejuno jejunal intussusception in the left upper quadrant as shown on image 56/5. I do see an obvious cause/lead point. No obstruction. Vascular/Lymphatic: Aortoiliac atherosclerotic vascular disease. Reproductive: Uterus absent.  Adnexa unremarkable. Other: No supplemental non-categorized findings. Musculoskeletal: Subtle sclerosis along the iliac crests, left pubic rami, and right superior pubic ramus compatible with prior osseous metastatic disease, not significantly changed from prior CT, and much more conspicuous on bone scan. IMPRESSION: 1. Mixed appearance of the pulmonary nodules, although generally mildly enlarged compared to the prior exam. At least 2 of the pulmonary nodules have reduced in size. 2. Generally improved appearance of the 2 metastatic lesions in the liver, with both reduced intensity and with 1 of the lesions significantly reduced in size. 3. Stable very subtle sclerotic osseous metastatic lesions involving the bony pelvis and right anterior seventh rib. These lesions are more conspicuous at bone scan. 4. New 4 cm  in length jejuno jejunal intussusception in the left upper quadrant, without a visible lead point. No dilated bowel to suggest obstruction. 5. Mild thickening of the medial limb of the left adrenal gland at 0.6 cm, previously 0.8 cm, suggests improving metastatic lesion. 6. Aortic Atherosclerosis (ICD10-I70.0). Electronically Signed   By: Van Clines M.D.   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     PLAN:    1. 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  CT scan results from March 04, 2019 reviewed independently and report as above with essentially stable disease with a mixed appearance of pulmonary nodules. MRI of her brain on January 09, 2019 did not reveal any metastatic disease.  Patient wishes to continue with palliative treatment.  Proceed with  cycle 5 of Taxotere and Cyramza today.  Continue with OnPro Neulasta support.  Return to clinic in 3 weeks for further evaluation and consideration of cycle 6.  Will reimage again at the conclusion of cycle 8.  2.  Anemia: Hemoglobin decreased, but mildly improved to 9.7. 3.  Pain: Patient does not complain of pain today.  Continue current narcotic regimen as prescribed. 4.  Palliative care: Appreciate input. 5.  Left shoulder mobility: Appreciate rehab input. 6.  Hypokalemia: Potassium slightly low today.  Continue oral potassium supplementation.  7.  Dysphasia: Patient does not complain of this today.  Swallow study and CT scan of the neck are negative.  Continue follow-up with GI.  MRI of the brain was negative as above.  Patient was also empirically given fluconazole for possible candidiasis.  8.  Chronic renal insufficiency: Patient's creatinine has trended up to 1.64.  She will receive an additional liter of IV fluids along with her treatment  today. 9.  Hypomagnesia: Resolved. 10.  Bony metastasis: Continue Zometa on even-numbered cycles.  Patient expressed understanding and was in agreement with this plan. She also understands that She can call clinic at any time with any questions, concerns, or complaints.   Cancer Staging Cancer of upper lobe of right lung [facility]) Staging form: Lung, AJCC 8th Edition - Clinical stage from 08/24/2017: Stage IV (cT2a, cN3, cM1c) - Signed by Lloyd Huger, MD on 03/29/2018   Lloyd Huger, MD   03/09/2019 12:51 PM

## 2019-03-08 ENCOUNTER — Other Ambulatory Visit: Payer: Self-pay

## 2019-03-09 ENCOUNTER — Inpatient Hospital Stay: Payer: Self-pay

## 2019-03-09 ENCOUNTER — Other Ambulatory Visit: Payer: Self-pay

## 2019-03-09 ENCOUNTER — Inpatient Hospital Stay (HOSPITAL_BASED_OUTPATIENT_CLINIC_OR_DEPARTMENT_OTHER): Payer: Self-pay | Admitting: Oncology

## 2019-03-09 ENCOUNTER — Encounter: Payer: Self-pay | Admitting: Oncology

## 2019-03-09 ENCOUNTER — Inpatient Hospital Stay: Payer: Self-pay | Attending: Oncology

## 2019-03-09 VITALS — BP 110/68 | HR 94 | Temp 96.1°F | Resp 18 | Wt 94.8 lb

## 2019-03-09 DIAGNOSIS — R5382 Chronic fatigue, unspecified: Secondary | ICD-10-CM | POA: Insufficient documentation

## 2019-03-09 DIAGNOSIS — C3411 Malignant neoplasm of upper lobe, right bronchus or lung: Secondary | ICD-10-CM | POA: Insufficient documentation

## 2019-03-09 DIAGNOSIS — I129 Hypertensive chronic kidney disease with stage 1 through stage 4 chronic kidney disease, or unspecified chronic kidney disease: Secondary | ICD-10-CM | POA: Insufficient documentation

## 2019-03-09 DIAGNOSIS — E785 Hyperlipidemia, unspecified: Secondary | ICD-10-CM | POA: Insufficient documentation

## 2019-03-09 DIAGNOSIS — Z7689 Persons encountering health services in other specified circumstances: Secondary | ICD-10-CM | POA: Insufficient documentation

## 2019-03-09 DIAGNOSIS — E876 Hypokalemia: Secondary | ICD-10-CM | POA: Insufficient documentation

## 2019-03-09 DIAGNOSIS — R5381 Other malaise: Secondary | ICD-10-CM | POA: Insufficient documentation

## 2019-03-09 DIAGNOSIS — C3490 Malignant neoplasm of unspecified part of unspecified bronchus or lung: Secondary | ICD-10-CM

## 2019-03-09 DIAGNOSIS — D649 Anemia, unspecified: Secondary | ICD-10-CM | POA: Insufficient documentation

## 2019-03-09 DIAGNOSIS — C787 Secondary malignant neoplasm of liver and intrahepatic bile duct: Secondary | ICD-10-CM | POA: Insufficient documentation

## 2019-03-09 DIAGNOSIS — Z803 Family history of malignant neoplasm of breast: Secondary | ICD-10-CM | POA: Insufficient documentation

## 2019-03-09 DIAGNOSIS — C7951 Secondary malignant neoplasm of bone: Secondary | ICD-10-CM | POA: Insufficient documentation

## 2019-03-09 DIAGNOSIS — Z87891 Personal history of nicotine dependence: Secondary | ICD-10-CM | POA: Insufficient documentation

## 2019-03-09 DIAGNOSIS — R630 Anorexia: Secondary | ICD-10-CM | POA: Insufficient documentation

## 2019-03-09 DIAGNOSIS — Z79899 Other long term (current) drug therapy: Secondary | ICD-10-CM | POA: Insufficient documentation

## 2019-03-09 DIAGNOSIS — R531 Weakness: Secondary | ICD-10-CM | POA: Insufficient documentation

## 2019-03-09 DIAGNOSIS — Z5111 Encounter for antineoplastic chemotherapy: Secondary | ICD-10-CM | POA: Insufficient documentation

## 2019-03-09 DIAGNOSIS — N189 Chronic kidney disease, unspecified: Secondary | ICD-10-CM | POA: Insufficient documentation

## 2019-03-09 DIAGNOSIS — E039 Hypothyroidism, unspecified: Secondary | ICD-10-CM | POA: Insufficient documentation

## 2019-03-09 DIAGNOSIS — Z5112 Encounter for antineoplastic immunotherapy: Secondary | ICD-10-CM | POA: Insufficient documentation

## 2019-03-09 DIAGNOSIS — M1990 Unspecified osteoarthritis, unspecified site: Secondary | ICD-10-CM | POA: Insufficient documentation

## 2019-03-09 LAB — COMPREHENSIVE METABOLIC PANEL
ALT: 10 U/L (ref 0–44)
AST: 31 U/L (ref 15–41)
Albumin: 3.5 g/dL (ref 3.5–5.0)
Alkaline Phosphatase: 75 U/L (ref 38–126)
Anion gap: 11 (ref 5–15)
BUN: 23 mg/dL (ref 8–23)
CO2: 28 mmol/L (ref 22–32)
Calcium: 9.4 mg/dL (ref 8.9–10.3)
Chloride: 95 mmol/L — ABNORMAL LOW (ref 98–111)
Creatinine, Ser: 1.64 mg/dL — ABNORMAL HIGH (ref 0.44–1.00)
GFR calc Af Amer: 38 mL/min — ABNORMAL LOW (ref >60)
GFR calc non Af Amer: 33 mL/min — ABNORMAL LOW (ref >60)
Glucose, Bld: 165 mg/dL — ABNORMAL HIGH (ref 70–99)
Potassium: 3.3 mmol/L — ABNORMAL LOW (ref 3.5–5.1)
Sodium: 134 mmol/L — ABNORMAL LOW (ref 135–145)
Total Bilirubin: 0.3 mg/dL (ref 0.3–1.2)
Total Protein: 6.8 g/dL (ref 6.5–8.1)

## 2019-03-09 LAB — CBC WITH DIFFERENTIAL/PLATELET
Abs Immature Granulocytes: 0.05 10*3/uL (ref 0.00–0.07)
Basophils Absolute: 0.1 10*3/uL (ref 0.0–0.1)
Basophils Relative: 1 %
Eosinophils Absolute: 0 10*3/uL (ref 0.0–0.5)
Eosinophils Relative: 0 %
HCT: 30.2 % — ABNORMAL LOW (ref 36.0–46.0)
Hemoglobin: 9.7 g/dL — ABNORMAL LOW (ref 12.0–15.0)
Immature Granulocytes: 1 %
Lymphocytes Relative: 14 %
Lymphs Abs: 1.1 10*3/uL (ref 0.7–4.0)
MCH: 33.3 pg (ref 26.0–34.0)
MCHC: 32.1 g/dL (ref 30.0–36.0)
MCV: 103.8 fL — ABNORMAL HIGH (ref 80.0–100.0)
Monocytes Absolute: 0.5 10*3/uL (ref 0.1–1.0)
Monocytes Relative: 6 %
Neutro Abs: 6.1 10*3/uL (ref 1.7–7.7)
Neutrophils Relative %: 78 %
Platelets: 267 10*3/uL (ref 150–400)
RBC: 2.91 MIL/uL — ABNORMAL LOW (ref 3.87–5.11)
RDW: 15.5 % (ref 11.5–15.5)
WBC: 7.8 10*3/uL (ref 4.0–10.5)
nRBC: 0 % (ref 0.0–0.2)

## 2019-03-09 LAB — SAMPLE TO BLOOD BANK

## 2019-03-09 LAB — MAGNESIUM: Magnesium: 1.9 mg/dL (ref 1.7–2.4)

## 2019-03-09 LAB — PROTEIN, URINE, RANDOM: Total Protein, Urine: 33 mg/dL

## 2019-03-09 MED ORDER — SODIUM CHLORIDE 0.9 % IV SOLN
Freq: Once | INTRAVENOUS | Status: AC
  Administered 2019-03-09: 10:00:00 via INTRAVENOUS
  Filled 2019-03-09: qty 250

## 2019-03-09 MED ORDER — PEGFILGRASTIM 6 MG/0.6ML ~~LOC~~ PSKT
6.0000 mg | PREFILLED_SYRINGE | Freq: Once | SUBCUTANEOUS | Status: AC
  Administered 2019-03-09: 12:00:00 6 mg via SUBCUTANEOUS
  Filled 2019-03-09: qty 0.6

## 2019-03-09 MED ORDER — HEPARIN SOD (PORK) LOCK FLUSH 100 UNIT/ML IV SOLN
500.0000 [IU] | Freq: Once | INTRAVENOUS | Status: AC
  Administered 2019-03-09: 13:00:00 500 [IU] via INTRAVENOUS
  Filled 2019-03-09: qty 5

## 2019-03-09 MED ORDER — SODIUM CHLORIDE 0.9 % IV SOLN
10.0000 mg/kg | Freq: Once | INTRAVENOUS | Status: AC
  Administered 2019-03-09: 11:00:00 500 mg via INTRAVENOUS
  Filled 2019-03-09: qty 50

## 2019-03-09 MED ORDER — ACETAMINOPHEN 160 MG/5ML PO SOLN
650.0000 mg | Freq: Once | ORAL | Status: AC
  Administered 2019-03-09: 650 mg via ORAL
  Filled 2019-03-09: qty 20.3

## 2019-03-09 MED ORDER — ACETAMINOPHEN 325 MG PO TABS
ORAL_TABLET | ORAL | Status: AC
  Filled 2019-03-09: qty 2

## 2019-03-09 MED ORDER — DEXAMETHASONE SODIUM PHOSPHATE 10 MG/ML IJ SOLN
10.0000 mg | Freq: Once | INTRAMUSCULAR | Status: AC
  Administered 2019-03-09: 10:00:00 10 mg via INTRAVENOUS
  Filled 2019-03-09: qty 1

## 2019-03-09 MED ORDER — DIPHENHYDRAMINE HCL 50 MG/ML IJ SOLN
25.0000 mg | Freq: Once | INTRAMUSCULAR | Status: AC
  Administered 2019-03-09: 10:00:00 25 mg via INTRAVENOUS
  Filled 2019-03-09: qty 1

## 2019-03-09 MED ORDER — SODIUM CHLORIDE 0.9% FLUSH
10.0000 mL | INTRAVENOUS | Status: AC | PRN
  Administered 2019-03-09: 09:00:00 10 mL via INTRAVENOUS
  Filled 2019-03-09: qty 10

## 2019-03-09 MED ORDER — SODIUM CHLORIDE 0.9 % IV SOLN
75.0000 mg/m2 | Freq: Once | INTRAVENOUS | Status: AC
  Administered 2019-03-09: 12:00:00 110 mg via INTRAVENOUS
  Filled 2019-03-09: qty 11

## 2019-03-09 NOTE — Progress Notes (Signed)
Patient denies any concerns today.  

## 2019-03-09 NOTE — Progress Notes (Unsigned)
Creatinine outside treatment parameters at 1.64, ok to proceed with treatment per Dr. Grayland Ormond

## 2019-03-09 NOTE — Progress Notes (Signed)
Cr 1.64, ok to proceed per MD

## 2019-03-11 ENCOUNTER — Other Ambulatory Visit: Payer: Self-pay | Admitting: Nurse Practitioner

## 2019-03-24 ENCOUNTER — Other Ambulatory Visit: Payer: Self-pay | Admitting: Unknown Physician Specialty

## 2019-03-26 NOTE — Progress Notes (Signed)
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    INTERVAL HISTORY: Patient returns to clinic today for further evaluation and consideration of cycle 6 of Taxotere and Cyramza.  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  She continues to have chronic weakness and fatigue.  She has a fair appetite and her weight has remained relatively stable. She denies any pain. She has no neurologic complaints.  She denies any recent fevers or illnesses. She denies any chest pain, shortness of breath, hemoptysis, or cough.  She has no nausea, vomiting, constipation, or diarrhea.  She has no melena or hematochezia.  She has no urinary complaints.  Patient offers no further specific complaints today.  REVIEW OF SYSTEMS:   Review of Systems  Constitutional: Positive for malaise/fatigue. Negative for fever and weight loss.  Respiratory: Negative.  Negative for cough and shortness of breath.   Cardiovascular: Negative.  Negative for chest pain and leg swelling.  Gastrointestinal: Negative.  Negative for abdominal pain, blood in stool and melena.  Genitourinary: Negative.  Negative for dysuria and flank pain.  Musculoskeletal: Negative.  Negative for back pain and joint pain.  Skin: Negative.  Negative for rash.  Neurological: Positive for tingling, sensory change and weakness. Negative for dizziness, focal weakness and headaches.  Psychiatric/Behavioral: Negative.  Negative for depression. The patient is not nervous/anxious.     As per HPI. Otherwise, a complete review of systems is negative.  PAST MEDICAL HISTORY: Past Medical  History:  Diagnosis Date   Arthritis    right shoulder   Cancer of right lung ([place]) 08/2017   Chemo + rad tx's.    Hyperlipidemia    Hypertension    Hypothyroidism    Menopausal state    Personal history of chemotherapy 2019   lung ca   Personal history of radiation therapy 2019   lung ca   Thyroid disease     PAST SURGICAL HISTORY: Past Surgical History:  Procedure Laterality Date   ABDOMINAL HYSTERECTOMY  2005   BREAST EXCISIONAL BIOPSY Right 1979   benign   COLONOSCOPY WITH PROPOFOL N/A 05/13/2017   Procedure: COLONOSCOPY WITH PROPOFOL;  Surgeon: [name], MD;  Location[facility] ENDOSCOPY;  Service: Gastroenterology;  Laterality: N/A;   CYSTECTOMY Right    breast   CYSTECTOMY  02/2015   back of neck   ESOPHAGOGASTRODUODENOSCOPY (EGD) WITH PROPOFOL N/A 05/13/2017   Procedure: ESOPHAGOGASTRODUODENOSCOPY (EGD) WITH PROPOFOL;  Surgeon: [name], MD;  Location[facility] ENDOSCOPY;  Service: Gastroenterology;  Laterality: N/A;   PORTA CATH INSERTION N/A 09/01/2017   Procedure: PORTA CATH INSERTION;  Surgeon: Algernon Huxley, MD;  Location: [place] CV LAB;  Service: Cardiovascular;  Laterality: N/A;   TUBAL LIGATION  1986    FAMILY HISTORY: Family History  Problem Relation Age of Onset   Hypertension Mother    Stroke Mother    Leukemia Mother    Stroke Father    Pneumonia Father    Diabetes Sister    Hyperlipidemia Sister    Breast cancer Maternal Aunt 60    ADVANCED DIRECTIVES (Y/N):  N  HEALTH MAINTENANCE: Social History   Tobacco Use   Smoking status:  Former Smoker    Packs/day: 0.25    Types: Cigarettes    Quit date: 02/01/2017    Years since quitting: 2.1   Smokeless tobacco: Never Used  Substance Use Topics   Alcohol use: No    Alcohol/week: 0.0 standard drinks   Drug use: No     Colonoscopy:  PAP:  Bone density:  Lipid panel:  No Known Allergies  Current Outpatient Medications  Medication Sig Dispense Refill    amLODipine (NORVASC) 5 MG tablet TAKE 1 TABLET DAILY 90 tablet 3   DULoxetine (CYMBALTA) 30 MG capsule Take 1 capsule (30 mg total) by mouth daily. 30 capsule 3   fluconazole (DIFLUCAN) 200 MG tablet Take 1 tablet (200 mg total) by mouth daily. 5 tablet 0   folic acid (FOLVITE) 1 MG tablet Take 1 tablet (1 mg total) by mouth daily. 90 tablet 4   levothyroxine (SYNTHROID) 50 MCG tablet TAKE 1 TABLET DAILY 90 tablet 1   lidocaine-prilocaine (EMLA) cream APPLY EXTERNALLY TO THE AFFECTED AREA 1 TIME 30 g 1   magic mouthwash w/lidocaine SOLN Take 5-10 mLs by mouth 4 (four) times daily as needed (mouth sores & pain). 480 mL 3   Oxycodone HCl 10 MG TABS Take 1 tablet (10 mg total) by mouth every 6 (six) hours as needed. 120 tablet 0   pantoprazole (PROTONIX) 40 MG tablet Take 1 tablet (40 mg total) by mouth daily. 30 tablet 0   potassium chloride 20 MEQ/15ML (10%) SOLN Take 15 mLs (20 mEq total) by mouth 2 (two) times daily. 473 mL 0   No current facility-administered medications for this visit.    Facility-Administered Medications Ordered in Other Visits  Medication Dose Route Frequency Provider Last Rate Last Dose   diphenhydrAMINE (BENADRYL) injection 25 mg  25 mg Intravenous Once Lloyd Huger, MD       DOCEtaxel (TAXOTERE) 110 mg in sodium chloride 0.9 % 250 mL chemo infusion  75 mg/m2 (Treatment Plan Recorded) Intravenous Once Lloyd Huger, MD       heparin lock flush 100 unit/mL  500 Units Intracatheter Once PRN Lloyd Huger, MD       pegfilgrastim (NEULASTA ONPRO KIT) injection 6 mg  6 mg Subcutaneous Once Lloyd Huger, MD       ramucirumab [facility]) 430 mg in sodium chloride 0.9 % 207 mL chemo infusion  430 mg Intravenous Once Lloyd Huger, MD       sodium chloride flush (NS) 0.9 % injection 10 mL  10 mL Intravenous PRN Lloyd Huger, MD   10 mL at 03/09/19 0830   Zoledronic Acid (ZOMETA) IVPB 4 mg  4 mg Intravenous Once Lloyd Huger, MD        OBJECTIVE: Vitals:   03/30/19 0852  BP: 111/67  Pulse: 89  Resp: 18  Temp: (!) 97.4 F (36.3 C)     Body mass index is 18.85 kg/m.    ECOG FS:2 - Symptomatic, <50% confined to bed  General: Thin, no acute distress.  Sitting in a wheelchair. Eyes: Pink conjunctiva, anicteric sclera. HEENT: Normocephalic, moist mucous membranes, clear oropharnyx. Lungs: Clear to auscultation bilaterally. Heart: Regular rate and rhythm. No rubs, murmurs, or gallops. Abdomen: Soft, nontender, nondistended. No organomegaly noted, normoactive bowel sounds. Musculoskeletal: No edema, cyanosis, or clubbing. Neuro: Alert, answering all questions appropriately. Cranial nerves grossly intact. Skin: No rashes or petechiae noted. Psych: Normal affect.  LAB RESULTS:  Lab Results  Component Value Date  NA 137 03/30/2019   K 3.3 (L) 03/30/2019   CL 99 03/30/2019   CO2 30 03/30/2019   GLUCOSE 151 (H) 03/30/2019   BUN 21 03/30/2019   CREATININE 1.52 (H) 03/30/2019   CALCIUM 8.7 (L) 03/30/2019   PROT 6.6 03/30/2019   ALBUMIN 3.2 (L) 03/30/2019   AST 35 03/30/2019   ALT 12 03/30/2019   ALKPHOS 71 03/30/2019   BILITOT 0.4 03/30/2019   GFRNONAA 36 (L) 03/30/2019   GFRAA 42 (L) 03/30/2019    Lab Results  Component Value Date   WBC 6.6 03/30/2019   NEUTROABS 4.9 03/30/2019   HGB 9.2 (L) 03/30/2019   HCT 29.1 (L) 03/30/2019   MCV 105.1 (H) 03/30/2019   PLT 243 03/30/2019     STUDIES: Ct Chest W Contrast  Result Date: 03/04/2019 CLINICAL DATA:  Progressive stage IV adenocarcinoma of the lung, metastatic disease to the skeleton and liver, surveillance and restaging. EXAM: CT CHEST, ABDOMEN, AND PELVIS WITH CONTRAST TECHNIQUE: Multidetector CT imaging of the chest, abdomen and pelvis was performed following the standard protocol during bolus administration of intravenous contrast. CONTRAST:  79m OMNIPAQUE IOHEXOL 300 MG/ML  SOLN COMPARISON:  Multiple exams, including 12/04/2018  FINDINGS: CT CHEST FINDINGS Cardiovascular: Left Port-A-Cath tip: SVC. Atherosclerotic calcification of the aortic arch and branch vessels. Mediastinum/Nodes: No pathologic thoracic adenopathy is identified. Lungs/Pleura: Right paramediastinal volume loss and bandlike opacity probably from radiation fibrosis. In a central area of clearing medially in the left upper lobe, spiculated density which could be therapy related or a true pulmonary nodule measures 0.9 by 0.8 cm, stable. Partially cavitary 1.1 by 0.9 cm right lower lobe pulmonary nodule on image 83/4, previously 1.0 by 0.8 cm. The other scattered generally small pulmonary nodules are variable, with most of the nodules appearing slightly larger than on the prior exam, although with an occasional nodule measuring smaller. For example the nodule along the left hemidiaphragm is smaller, measuring 0.6 by 0.5 cm on image 91/4, previously 0.8 by 0.6 cm. Musculoskeletal: Mild sclerosis of the right anterior seventh rib and, indistinct and essentially stable. CT ABDOMEN PELVIS FINDINGS Hepatobiliary: Irregular primarily hypodense lesion posteriorly in the right hepatic lobe measures 2.9 by 2.0 cm on image 41/2, formerly 2.9 by 1.9 cm, essentially stable. The hypodense lesion below the gallbladder fossa measures 1.9 by 1.5 cm on image 45/2, formerly 2.5 by 2.3 cm (improved). No new hepatic lesions are identified. Gallbladder unremarkable. Pancreas: Unremarkable Spleen: Unremarkable Adrenals/Urinary Tract: The medial limb of the left adrenal gland has previously been hypermetabolic and thickened. Currently the medial limb measures 6 mm in thickness, formerly 8 mm on 12/04/2018. Stomach/Bowel: 4 cm jejuno jejunal intussusception in the left upper quadrant as shown on image 56/5. I do see an obvious cause/lead point. No obstruction. Vascular/Lymphatic: Aortoiliac atherosclerotic vascular disease. Reproductive: Uterus absent.  Adnexa unremarkable. Other: No supplemental  non-categorized findings. Musculoskeletal: Subtle sclerosis along the iliac crests, left pubic rami, and right superior pubic ramus compatible with prior osseous metastatic disease, not significantly changed from prior CT, and much more conspicuous on bone scan. IMPRESSION: 1. Mixed appearance of the pulmonary nodules, although generally mildly enlarged compared to the prior exam. At least 2 of the pulmonary nodules have reduced in size. 2. Generally improved appearance of the 2 metastatic lesions in the liver, with both reduced intensity and with 1 of the lesions significantly reduced in size. 3. Stable very subtle sclerotic osseous metastatic lesions involving the bony pelvis and right anterior seventh rib. These lesions  are more conspicuous at bone scan. 4. New 4 cm in length jejuno jejunal intussusception in the left upper quadrant, without a visible lead point. No dilated bowel to suggest obstruction. 5. Mild thickening of the medial limb of the left adrenal gland at 0.6 cm, previously 0.8 cm, suggests improving metastatic lesion. 6. Aortic Atherosclerosis (ICD10-I70.0). Electronically Signed   By: Van Clines M.D.   On: 03/04/2019 14:21   Ct Abdomen Pelvis W Contrast  Result Date: 03/04/2019 CLINICAL DATA:  Progressive stage IV adenocarcinoma of the lung, metastatic disease to the skeleton and liver, surveillance and restaging. EXAM: CT CHEST, ABDOMEN, AND PELVIS WITH CONTRAST TECHNIQUE: Multidetector CT imaging of the chest, abdomen and pelvis was performed following the standard protocol during bolus administration of intravenous contrast. CONTRAST:  55m OMNIPAQUE IOHEXOL 300 MG/ML  SOLN COMPARISON:  Multiple exams, including 12/04/2018 FINDINGS: CT CHEST FINDINGS Cardiovascular: Left Port-A-Cath tip: SVC. Atherosclerotic calcification of the aortic arch and branch vessels. Mediastinum/Nodes: No pathologic thoracic adenopathy is identified. Lungs/Pleura: Right paramediastinal volume loss and  bandlike opacity probably from radiation fibrosis. In a central area of clearing medially in the left upper lobe, spiculated density which could be therapy related or a true pulmonary nodule measures 0.9 by 0.8 cm, stable. Partially cavitary 1.1 by 0.9 cm right lower lobe pulmonary nodule on image 83/4, previously 1.0 by 0.8 cm. The other scattered generally small pulmonary nodules are variable, with most of the nodules appearing slightly larger than on the prior exam, although with an occasional nodule measuring smaller. For example the nodule along the left hemidiaphragm is smaller, measuring 0.6 by 0.5 cm on image 91/4, previously 0.8 by 0.6 cm. Musculoskeletal: Mild sclerosis of the right anterior seventh rib and, indistinct and essentially stable. CT ABDOMEN PELVIS FINDINGS Hepatobiliary: Irregular primarily hypodense lesion posteriorly in the right hepatic lobe measures 2.9 by 2.0 cm on image 41/2, formerly 2.9 by 1.9 cm, essentially stable. The hypodense lesion below the gallbladder fossa measures 1.9 by 1.5 cm on image 45/2, formerly 2.5 by 2.3 cm (improved). No new hepatic lesions are identified. Gallbladder unremarkable. Pancreas: Unremarkable Spleen: Unremarkable Adrenals/Urinary Tract: The medial limb of the left adrenal gland has previously been hypermetabolic and thickened. Currently the medial limb measures 6 mm in thickness, formerly 8 mm on 12/04/2018. Stomach/Bowel: 4 cm jejuno jejunal intussusception in the left upper quadrant as shown on image 56/5. I do see an obvious cause/lead point. No obstruction. Vascular/Lymphatic: Aortoiliac atherosclerotic vascular disease. Reproductive: Uterus absent.  Adnexa unremarkable. Other: No supplemental non-categorized findings. Musculoskeletal: Subtle sclerosis along the iliac crests, left pubic rami, and right superior pubic ramus compatible with prior osseous metastatic disease, not significantly changed from prior CT, and much more conspicuous on bone scan.  IMPRESSION: 1. Mixed appearance of the pulmonary nodules, although generally mildly enlarged compared to the prior exam. At least 2 of the pulmonary nodules have reduced in size. 2. Generally improved appearance of the 2 metastatic lesions in the liver, with both reduced intensity and with 1 of the lesions significantly reduced in size. 3. Stable very subtle sclerotic osseous metastatic lesions involving the bony pelvis and right anterior seventh rib. These lesions are more conspicuous at bone scan. 4. New 4 cm in length jejuno jejunal intussusception in the left upper quadrant, without a visible lead point. No dilated bowel to suggest obstruction. 5. Mild thickening of the medial limb of the left adrenal gland at 0.6 cm, previously 0.8 cm, suggests improving metastatic lesion. 6. Aortic Atherosclerosis (ICD10-I70.0). Electronically  Signed   By: Van Clines M.D.   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     PLAN:    1. 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  CT scan results from March 04, 2019 reviewed independently with essentially stable disease with a mixed appearance of pulmonary nodules. MRI of her brain on January 09, 2019 did not reveal any metastatic disease.  Patient wishes to continue with palliative treatment.  Proceed with cycle 6 of Taxotere and Cyramza today.  Continue with OnPro Neulasta support as well.  Return to clinic in 3 weeks for further evaluation and consideration of cycle 7.  Will reimage at the conclusion of cycle 8. 2.  Anemia: Hemoglobin has trended down slightly to 9.2, monitor. 3.  Pain: Patient does not complain of pain today.  Continue current narcotic regimen as prescribed. 4.  Palliative care: Appreciate input. 5.  Left shoulder mobility: Appreciate rehab  input. 6.  Hypokalemia: Potassium decreased, but unchanged at 3.3.  Continue oral potassium supplementation.  7.  Dysphasia: Patient does not complain of this today.  Swallow study and CT scan of the neck are negative.  Continue follow-up with GI.  MRI of the brain was negative as above.  Patient was also empirically given fluconazole for possible candidiasis.  8.  Chronic renal insufficiency: Creatinine has improved to 1.52.  She will receive an additional liter of IV fluids along with her treatment today. 9.  Hypomagnesia: Resolved. 10.  Bony metastasis: Continue Zometa on even-numbered cycles.  Patient expressed understanding and was in agreement with this plan. She also understands that She can call clinic at any time with any questions, concerns, or complaints.   Cancer Staging Cancer of upper lobe of right lung [facility]) Staging form: Lung, AJCC 8th Edition - Clinical stage from 08/24/2017: Stage IV (cT2a, cN3, cM1c) - Signed by Lloyd Huger, MD on 03/29/2018   Lloyd Huger, MD   03/30/2019 10:00 AM

## 2019-03-29 ENCOUNTER — Other Ambulatory Visit: Payer: Self-pay

## 2019-03-30 ENCOUNTER — Inpatient Hospital Stay: Payer: Self-pay

## 2019-03-30 ENCOUNTER — Other Ambulatory Visit: Payer: Self-pay

## 2019-03-30 ENCOUNTER — Encounter: Payer: Self-pay | Admitting: Oncology

## 2019-03-30 ENCOUNTER — Inpatient Hospital Stay (HOSPITAL_BASED_OUTPATIENT_CLINIC_OR_DEPARTMENT_OTHER): Payer: Self-pay | Admitting: Oncology

## 2019-03-30 VITALS — BP 111/67 | HR 89 | Temp 97.4°F | Resp 18 | Wt 96.5 lb

## 2019-03-30 DIAGNOSIS — C3411 Malignant neoplasm of upper lobe, right bronchus or lung: Secondary | ICD-10-CM

## 2019-03-30 DIAGNOSIS — Z95828 Presence of other vascular implants and grafts: Secondary | ICD-10-CM

## 2019-03-30 DIAGNOSIS — C3490 Malignant neoplasm of unspecified part of unspecified bronchus or lung: Secondary | ICD-10-CM

## 2019-03-30 LAB — CBC WITH DIFFERENTIAL/PLATELET
Abs Immature Granulocytes: 0.04 10*3/uL (ref 0.00–0.07)
Basophils Absolute: 0.1 10*3/uL (ref 0.0–0.1)
Basophils Relative: 1 %
Eosinophils Absolute: 0 10*3/uL (ref 0.0–0.5)
Eosinophils Relative: 0 %
HCT: 29.1 % — ABNORMAL LOW (ref 36.0–46.0)
Hemoglobin: 9.2 g/dL — ABNORMAL LOW (ref 12.0–15.0)
Immature Granulocytes: 1 %
Lymphocytes Relative: 16 %
Lymphs Abs: 1.1 10*3/uL (ref 0.7–4.0)
MCH: 33.2 pg (ref 26.0–34.0)
MCHC: 31.6 g/dL (ref 30.0–36.0)
MCV: 105.1 fL — ABNORMAL HIGH (ref 80.0–100.0)
Monocytes Absolute: 0.6 10*3/uL (ref 0.1–1.0)
Monocytes Relative: 9 %
Neutro Abs: 4.9 10*3/uL (ref 1.7–7.7)
Neutrophils Relative %: 73 %
Platelets: 243 10*3/uL (ref 150–400)
RBC: 2.77 MIL/uL — ABNORMAL LOW (ref 3.87–5.11)
RDW: 15.8 % — ABNORMAL HIGH (ref 11.5–15.5)
WBC: 6.6 10*3/uL (ref 4.0–10.5)
nRBC: 0 % (ref 0.0–0.2)

## 2019-03-30 LAB — COMPREHENSIVE METABOLIC PANEL
ALT: 12 U/L (ref 0–44)
AST: 35 U/L (ref 15–41)
Albumin: 3.2 g/dL — ABNORMAL LOW (ref 3.5–5.0)
Alkaline Phosphatase: 71 U/L (ref 38–126)
Anion gap: 8 (ref 5–15)
BUN: 21 mg/dL (ref 8–23)
CO2: 30 mmol/L (ref 22–32)
Calcium: 8.7 mg/dL — ABNORMAL LOW (ref 8.9–10.3)
Chloride: 99 mmol/L (ref 98–111)
Creatinine, Ser: 1.52 mg/dL — ABNORMAL HIGH (ref 0.44–1.00)
GFR calc Af Amer: 42 mL/min — ABNORMAL LOW (ref >60)
GFR calc non Af Amer: 36 mL/min — ABNORMAL LOW (ref >60)
Glucose, Bld: 151 mg/dL — ABNORMAL HIGH (ref 70–99)
Potassium: 3.3 mmol/L — ABNORMAL LOW (ref 3.5–5.1)
Sodium: 137 mmol/L (ref 135–145)
Total Bilirubin: 0.4 mg/dL (ref 0.3–1.2)
Total Protein: 6.6 g/dL (ref 6.5–8.1)

## 2019-03-30 LAB — SAMPLE TO BLOOD BANK

## 2019-03-30 LAB — MAGNESIUM: Magnesium: 1.8 mg/dL (ref 1.7–2.4)

## 2019-03-30 LAB — PROTEIN, URINE, RANDOM: Total Protein, Urine: 48 mg/dL

## 2019-03-30 MED ORDER — ZOLEDRONIC ACID 4 MG/100ML IV SOLN
4.0000 mg | Freq: Once | INTRAVENOUS | Status: AC
  Administered 2019-03-30: 4 mg via INTRAVENOUS
  Filled 2019-03-30: qty 100

## 2019-03-30 MED ORDER — PEGFILGRASTIM 6 MG/0.6ML ~~LOC~~ PSKT
6.0000 mg | PREFILLED_SYRINGE | Freq: Once | SUBCUTANEOUS | Status: AC
  Administered 2019-03-30: 13:00:00 6 mg via SUBCUTANEOUS
  Filled 2019-03-30: qty 0.6

## 2019-03-30 MED ORDER — DEXAMETHASONE SODIUM PHOSPHATE 10 MG/ML IJ SOLN
10.0000 mg | Freq: Once | INTRAMUSCULAR | Status: AC
  Administered 2019-03-30: 10 mg via INTRAVENOUS
  Filled 2019-03-30: qty 1

## 2019-03-30 MED ORDER — SODIUM CHLORIDE 0.9 % IV SOLN
430.0000 mg | Freq: Once | INTRAVENOUS | Status: AC
  Administered 2019-03-30: 11:00:00 430 mg via INTRAVENOUS
  Filled 2019-03-30: qty 43

## 2019-03-30 MED ORDER — SODIUM CHLORIDE 0.9 % IV SOLN
Freq: Once | INTRAVENOUS | Status: AC
  Administered 2019-03-30: 10:00:00 via INTRAVENOUS
  Filled 2019-03-30: qty 250

## 2019-03-30 MED ORDER — HEPARIN SOD (PORK) LOCK FLUSH 100 UNIT/ML IV SOLN
500.0000 [IU] | Freq: Once | INTRAVENOUS | Status: AC | PRN
  Administered 2019-03-30: 500 [IU]
  Filled 2019-03-30: qty 5

## 2019-03-30 MED ORDER — SODIUM CHLORIDE 0.9% FLUSH
10.0000 mL | Freq: Once | INTRAVENOUS | Status: AC
  Administered 2019-03-30: 08:00:00 10 mL via INTRAVENOUS
  Filled 2019-03-30: qty 10

## 2019-03-30 MED ORDER — ACETAMINOPHEN 160 MG/5ML PO SOLN
650.0000 mg | Freq: Once | ORAL | Status: AC
  Administered 2019-03-30: 10:00:00 650 mg via ORAL
  Filled 2019-03-30: qty 20.3

## 2019-03-30 MED ORDER — SODIUM CHLORIDE 0.9 % IV SOLN: 10.0000 mg/kg | Freq: Once | INTRAVENOUS | Status: DC

## 2019-03-30 MED ORDER — SODIUM CHLORIDE 0.9 % IV SOLN
75.0000 mg/m2 | Freq: Once | INTRAVENOUS | Status: AC
  Administered 2019-03-30: 12:00:00 110 mg via INTRAVENOUS
  Filled 2019-03-30: qty 11

## 2019-03-30 MED ORDER — DIPHENHYDRAMINE HCL 50 MG/ML IJ SOLN
25.0000 mg | Freq: Once | INTRAMUSCULAR | Status: AC
  Administered 2019-03-30: 25 mg via INTRAVENOUS
  Filled 2019-03-30: qty 1

## 2019-03-30 NOTE — Progress Notes (Signed)
Pt in for follow up, reports having more numbness in pinky fingers.  Pt also stats she is more short of breath at bedtime when lying down.

## 2019-03-30 NOTE — Progress Notes (Signed)
Per MD, will decrease dose of cyramza to 430mg  due to more than 10% difference from calculated dose.

## 2019-03-30 NOTE — Progress Notes (Signed)
Per MD to continue with treatment today with creatinine 1.52 and to continue with Zometa infusion with Ca- 8.7. Pt updated and all questions answered at this time.   Janet [name] [employer]

## 2019-04-09 NOTE — Progress Notes (Signed)
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    INTERVAL HISTORY: Patient returns to clinic today for further evaluation and consideration of cycle 7 of Taxotere and Cyramza.  She continues to have a mild peripheral neuropathy, but this does not affect her day-to-day activity.  She continues to have chronic weakness and fatigue.  She has a fair appetite and her weight has remained relatively stable. She denies any pain. She has no neurologic complaints.  She denies any recent fevers or illnesses. She denies any chest pain, shortness of breath, hemoptysis, or cough.  She has no nausea, vomiting, constipation, or diarrhea.  She has no melena or hematochezia.  She has no urinary complaints.  Patient offers no further specific complaints today.   REVIEW OF SYSTEMS:   Review of Systems  Constitutional: Positive for malaise/fatigue. Negative for fever and weight loss.  Respiratory: Negative.  Negative for cough and shortness of breath.   Cardiovascular: Negative.  Negative for chest pain and leg swelling.  Gastrointestinal: Negative.  Negative for abdominal pain, blood in stool and melena.  Genitourinary: Negative.  Negative for dysuria and flank pain.  Musculoskeletal: Negative.  Negative for back pain and joint pain.  Skin: Negative.  Negative for rash.  Neurological: Positive for tingling, sensory change and weakness. Negative for dizziness, focal weakness and headaches.  Psychiatric/Behavioral: Negative.  Negative for depression. The patient is not nervous/anxious.     As per HPI. Otherwise, a complete review of systems is negative.  PAST MEDICAL HISTORY:  Past Medical History:  Diagnosis Date  . Arthritis    right shoulder  . Cancer of right lung ([place]) 08/2017   Chemo + rad tx's.   Marland Kitchen Hyperlipidemia   . Hypertension   . Hypothyroidism   . Menopausal state   . Personal history of chemotherapy 2019   lung ca  . Personal history of radiation therapy 2019   lung ca  . Thyroid disease     PAST SURGICAL HISTORY: Past Surgical History:  Procedure Laterality Date  . ABDOMINAL HYSTERECTOMY  2005  . BREAST EXCISIONAL BIOPSY Right 1979   benign  . COLONOSCOPY WITH PROPOFOL N/A 05/13/2017   Procedure: COLONOSCOPY WITH PROPOFOL;  Surgeon: [name], MD;  Location[facility] ENDOSCOPY;  Service: Gastroenterology;  Laterality: N/A;  . CYSTECTOMY Right    breast  . CYSTECTOMY  02/2015   back of neck  . ESOPHAGOGASTRODUODENOSCOPY (EGD) WITH PROPOFOL N/A 05/13/2017   Procedure: ESOPHAGOGASTRODUODENOSCOPY (EGD) WITH PROPOFOL;  Surgeon: [name], MD;  Location: [facility] ENDOSCOPY;  Service: Gastroenterology;  Laterality: N/A;  . PORTA CATH INSERTION N/A 09/01/2017   Procedure: PORTA CATH INSERTION;  Surgeon: Algernon Huxley, MD;  Location: [place] CV LAB;  Service: Cardiovascular;  Laterality: N/A;  . TUBAL LIGATION  1986    FAMILY HISTORY: Family History  Problem Relation Age of Onset  . Hypertension Mother   . Stroke Mother   . Leukemia Mother   . Stroke Father   . Pneumonia Father   . Diabetes Sister   . Hyperlipidemia Sister   . Breast cancer Maternal Aunt 85    ADVANCED DIRECTIVES (Y/N):  N  HEALTH MAINTENANCE: Social History   Tobacco Use  .  Smoking status: Former Smoker    Packs/day: 0.25    Types: Cigarettes    Quit date: 02/01/2017    Years since quitting: 2.2  . Smokeless tobacco: Never Used  Substance Use Topics  . Alcohol use: No    Alcohol/week: 0.0 standard drinks  . Drug use: No     Colonoscopy:  PAP:  Bone density:  Lipid panel:  No Known Allergies  Current Outpatient Medications  Medication Sig  Dispense Refill  . DULoxetine (CYMBALTA) 30 MG capsule Take 1 capsule (30 mg total) by mouth daily. 30 capsule 3  . levothyroxine (SYNTHROID) 75 MCG tablet Take 1 tablet (75 mcg total) by mouth daily. 90 tablet 0  . lidocaine-prilocaine (EMLA) cream APPLY EXTERNALLY TO THE AFFECTED AREA 1 TIME 30 g 1  . Oxycodone HCl 10 MG TABS Take 1 tablet (10 mg total) by mouth every 6 (six) hours as needed. 120 tablet 0  . pantoprazole (PROTONIX) 40 MG tablet Take 1 tablet (40 mg total) by mouth daily. 30 tablet 0  . potassium chloride 20 MEQ/15ML (10%) SOLN Take 15 mLs (20 mEq total) by mouth 2 (two) times daily. 473 mL 0   No current facility-administered medications for this visit.    Facility-Administered Medications Ordered in Other Visits  Medication Dose Route Frequency Provider Last Rate Last Dose  . heparin lock flush 100 unit/mL  500 Units Intravenous Once Lloyd Huger, MD      . sodium chloride flush (NS) 0.9 % injection 10 mL  10 mL Intravenous PRN Lloyd Huger, MD   10 mL at 03/09/19 0830  . sodium chloride flush (NS) 0.9 % injection 10 mL  10 mL Intravenous PRN Lloyd Huger, MD   10 mL at 04/20/19 0839    OBJECTIVE: Vitals:   04/20/19 0907  BP: 102/65  Pulse: (!) 101  Resp: 16  Temp: (!) 95.5 F (35.3 C)  SpO2: 100%     Body mass index is 19.12 kg/m.    ECOG FS:2 - Symptomatic, <50% confined to bed  General: Thin, no acute distress.  Sitting in a wheelchair. Eyes: Pink conjunctiva, anicteric sclera. HEENT: Normocephalic, moist mucous membranes, clear oropharnyx. Lungs: Clear to auscultation bilaterally. Heart: Regular rate and rhythm. No rubs, murmurs, or gallops. Abdomen: Soft, nontender, nondistended. No organomegaly noted, normoactive bowel sounds. Musculoskeletal: No edema, cyanosis, or clubbing. Neuro: Alert, answering all questions appropriately. Cranial nerves grossly intact. Skin: No rashes or petechiae noted. Psych: Normal affect.  LAB RESULTS:   Lab Results  Component Value Date   NA 138 04/20/2019   K 3.6 04/20/2019   CL 100 04/20/2019   CO2 29 04/20/2019   GLUCOSE 157 (H) 04/20/2019   BUN 21 04/20/2019   CREATININE 1.44 (H) 04/20/2019   CALCIUM 8.9 04/20/2019   PROT 6.5 04/20/2019   ALBUMIN 3.1 (L) 04/20/2019   AST 34 04/20/2019   ALT 11 04/20/2019   ALKPHOS 75 04/20/2019   BILITOT 0.3 04/20/2019   GFRNONAA 39 (L) 04/20/2019   GFRAA 45 (L) 04/20/2019    Lab Results  Component Value Date   WBC 6.5 04/20/2019   NEUTROABS 5.0 04/20/2019   HGB 9.4 (L) 04/20/2019   HCT 29.6 (L) 04/20/2019   MCV 105.3 (H) 04/20/2019   PLT 257 04/20/2019     STUDIES: No results found.  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     PLAN:    1. Progressive stage IV  adenocarcinoma of the lung with metastatic disease in bones and liver: Previously, OmniSeq testing did not reveal any actionable mutations.  CT scan results from March 04, 2019 reviewed independently with essentially stable disease with a mixed appearance of pulmonary nodules. MRI of her brain on January 09, 2019 did not reveal any metastatic disease.  Patient wishes to continue with palliative treatment.  Proceed with cycle 7 of Taxotere and Cyramza today.  Patient will also receive OnProNeulasta support.  Return to clinic in 3 weeks for further evaluation and consideration of cycle 8.  Will reimage at the conclusion of cycle 8. 2.  Anemia: Hemoglobin is chronic and unchanged at 9.4. 3.  Pain: Patient does not complain of pain today.  Continue current narcotic regimen as prescribed. 4.  Palliative care: Appreciate input. 5.  Left shoulder mobility: Appreciate rehab input. 6.  Hypokalemia: Resolved.  Continue oral supplementation. 7.  Dysphasia: Patient does not complain of this today.  Swallow study and CT scan of the neck are negative.  Continue follow-up with GI.  MRI of the brain was negative as above.  Patient was  also empirically given fluconazole for possible candidiasis.  8.  Chronic renal insufficiency: Creatinine is slightly improved to 1.44. 9.  Hypomagnesia: Resolved. 10.  Bony metastasis: Continue Zometa on even-numbered cycles.  Patient expressed understanding and was in agreement with this plan. She also understands that She can call clinic at any time with any questions, concerns, or complaints.   Cancer Staging Cancer of upper lobe of right lung [facility]) Staging form: Lung, AJCC 8th Edition - Clinical stage from 08/24/2017: Stage IV (cT2a, cN3, cM1c) - Signed by Lloyd Huger, MD on 03/29/2018   Lloyd Huger, MD   04/20/2019 9:20 AM

## 2019-04-15 ENCOUNTER — Other Ambulatory Visit: Payer: Self-pay

## 2019-04-15 ENCOUNTER — Encounter: Payer: Self-pay | Admitting: Family Medicine

## 2019-04-15 ENCOUNTER — Ambulatory Visit (INDEPENDENT_AMBULATORY_CARE_PROVIDER_SITE_OTHER): Payer: Self-pay | Admitting: Family Medicine

## 2019-04-15 VITALS — BP 95/62 | HR 99 | Temp 98.6°F | Ht 60.0 in | Wt 95.0 lb

## 2019-04-15 DIAGNOSIS — I10 Essential (primary) hypertension: Secondary | ICD-10-CM

## 2019-04-15 DIAGNOSIS — E785 Hyperlipidemia, unspecified: Secondary | ICD-10-CM

## 2019-04-15 DIAGNOSIS — C3411 Malignant neoplasm of upper lobe, right bronchus or lung: Secondary | ICD-10-CM

## 2019-04-15 DIAGNOSIS — E039 Hypothyroidism, unspecified: Secondary | ICD-10-CM

## 2019-04-15 NOTE — Progress Notes (Signed)
BP 95/62   Pulse 99   Temp 98.6 F (37 C) (Oral)   Ht 5' (1.524 m)   Wt 95 lb (43.1 kg)   LMP 08/08/2003 (Approximate) Comment: Hysterectomy 2004  SpO2 98%   BMI 18.55 kg/m    Subjective:    Patient ID: Janet [name], female    DOB: February 21, 1956, 63 y.o.   MRN: [id number]  HPI: TAMANTHA [name] is a 63 y.o. female  Chief Complaint  Patient presents with  . Hypertension    80m f/u  . Hyperlipidemia   Patient presenting today for 6 month f/u chronic conditions.   HTN - Does not regularly check BPs at home, but always low normal when checked at her doctor appts. Does feel dizzy sometimes when getting up. Taking her medication faithfully. Has lost a significant amount of weight since being diagnosed and undergoing treatment for her lung cancer 08/2017. Currently being followed closely by Oncology receiving treatments for this. Feeling "as well as can be expected".   HLD - diet controlled now as she has stopped her lipitor. Does not eat very much due to cancer treatments. Does not exercise due to her chronic conditions.   Hypothyroidism - on 50 mcg synthroid, feels very tired but this is not abnormal for her with her oncology course.   Relevant past medical, surgical, family and social history reviewed and updated as indicated. Interim medical history since our last visit reviewed. Allergies and medications reviewed and updated.  Review of Systems  Per HPI unless specifically indicated above     Objective:    BP 95/62   Pulse 99   Temp 98.6 F (37 C) (Oral)   Ht 5' (1.524 m)   Wt 95 lb (43.1 kg)   LMP 08/08/2003 (Approximate) Comment: Hysterectomy 2004  SpO2 98%   BMI 18.55 kg/m   Wt Readings from Last 3 Encounters:  04/15/19 95 lb (43.1 kg)  03/30/19 96 lb 8 oz (43.8 kg)  03/09/19 94 lb 12.8 oz (43 kg)    Physical Exam Vitals signs and nursing note reviewed.  Constitutional:      Appearance: She is not ill-appearing.     Comments: Underweight   HENT:   Head: Atraumatic.  Eyes:     Extraocular Movements: Extraocular movements intact.     Conjunctiva/sclera: Conjunctivae normal.  Neck:     Musculoskeletal: Normal range of motion and neck supple.     Thyroid: No thyromegaly.  Cardiovascular:     Rate and Rhythm: Normal rate and regular rhythm.     Heart sounds: Normal heart sounds.  Pulmonary:     Effort: Pulmonary effort is normal. No respiratory distress.  Abdominal:     General: Bowel sounds are normal.     Palpations: Abdomen is soft.     Tenderness: There is no abdominal tenderness. There is no guarding.  Musculoskeletal: Normal range of motion.  Skin:    General: Skin is warm and dry.  Neurological:     Mental Status: She is alert and oriented to person, place, and time.  Psychiatric:        Mood and Affect: Mood normal.        Thought Content: Thought content normal.        Judgment: Judgment normal.     Results for orders placed or performed in visit on 04/15/19  Lipid Panel w/o Chol/HDL Ratio  Result Value Ref Range   Cholesterol, Total 156 100 - 199 mg/dL   Triglycerides  140 0 - 149 mg/dL   HDL 51 >39 mg/dL   VLDL Cholesterol Cal 24 5 - 40 mg/dL   LDL Chol Calc (NIH) 81 0 - 99 mg/dL  Comprehensive metabolic panel  Result Value Ref Range   Glucose 108 (H) 65 - 99 mg/dL   BUN 16 8 - 27 mg/dL   Creatinine, Ser 1.56 (H) 0.57 - 1.00 mg/dL   GFR calc non Af Amer 35 (L) >59 mL/min/1.73   GFR calc Af Amer 40 (L) >59 mL/min/1.73   BUN/Creatinine Ratio 10 (L) 12 - 28   Sodium 141 134 - 144 mmol/L   Potassium 3.6 3.5 - 5.2 mmol/L   Chloride 97 96 - 106 mmol/L   CO2 24 20 - 29 mmol/L   Calcium 9.0 8.7 - 10.3 mg/dL   Total Protein 6.1 6.0 - 8.5 g/dL   Albumin 3.3 (L) 3.8 - 4.8 g/dL   Globulin, Total 2.8 1.5 - 4.5 g/dL   Albumin/Globulin Ratio 1.2 1.2 - 2.2   Bilirubin Total 0.3 0.0 - 1.2 mg/dL   Alkaline Phosphatase 93 39 - 117 IU/L   AST 32 0 - 40 IU/L   ALT 9 0 - 32 IU/L  TSH  Result Value Ref Range   TSH  96.700 (H) 0.450 - 4.500 uIU/mL      Assessment & Plan:   Problem List Items Addressed This Visit      Cardiovascular and Mediastinum   Hypertension    D/c amlodipine, suspect no longer needed due to significant weight loss. Patient will check home readings and call if persistently elevated after d/c      Relevant Orders   Comprehensive metabolic panel (Completed)     Respiratory   Cancer of upper lobe of right lung [facility])    Following closely with Oncology for treatments        Endocrine   Hypothyroidism - Primary    Recheck TSH, adjust synthroid dose as needed.       Relevant Orders   TSH (Completed)     Other   Hyperlipidemia    Off lipitor, recheck lipids and adjust as needed      Relevant Orders   Lipid Panel w/o Chol/HDL Ratio (Completed)       Follow up plan: Return in about 6 months (around 10/13/2019) for 6 month.

## 2019-04-16 LAB — COMPREHENSIVE METABOLIC PANEL
ALT: 9 IU/L (ref 0–32)
AST: 32 IU/L (ref 0–40)
Albumin/Globulin Ratio: 1.2 (ref 1.2–2.2)
Albumin: 3.3 g/dL — ABNORMAL LOW (ref 3.8–4.8)
Alkaline Phosphatase: 93 IU/L (ref 39–117)
BUN/Creatinine Ratio: 10 — ABNORMAL LOW (ref 12–28)
BUN: 16 mg/dL (ref 8–27)
Bilirubin Total: 0.3 mg/dL (ref 0.0–1.2)
CO2: 24 mmol/L (ref 20–29)
Calcium: 9 mg/dL (ref 8.7–10.3)
Chloride: 97 mmol/L (ref 96–106)
Creatinine, Ser: 1.56 mg/dL — ABNORMAL HIGH (ref 0.57–1.00)
GFR calc Af Amer: 40 mL/min/{1.73_m2} — ABNORMAL LOW (ref >59)
GFR calc non Af Amer: 35 mL/min/{1.73_m2} — ABNORMAL LOW (ref >59)
Globulin, Total: 2.8 g/dL (ref 1.5–4.5)
Glucose: 108 mg/dL — ABNORMAL HIGH (ref 65–99)
Potassium: 3.6 mmol/L (ref 3.5–5.2)
Sodium: 141 mmol/L (ref 134–144)
Total Protein: 6.1 g/dL (ref 6.0–8.5)

## 2019-04-16 LAB — LIPID PANEL W/O CHOL/HDL RATIO
Cholesterol, Total: 156 mg/dL (ref 100–199)
HDL: 51 mg/dL (ref >39)
LDL Chol Calc (NIH): 81 mg/dL (ref 0–99)
Triglycerides: 140 mg/dL (ref 0–149)
VLDL Cholesterol Cal: 24 mg/dL (ref 5–40)

## 2019-04-16 LAB — TSH: TSH: 96.7 u[IU]/mL — ABNORMAL HIGH (ref 0.450–4.500)

## 2019-04-19 ENCOUNTER — Other Ambulatory Visit: Payer: Self-pay

## 2019-04-19 ENCOUNTER — Encounter: Payer: Self-pay | Admitting: Oncology

## 2019-04-19 ENCOUNTER — Other Ambulatory Visit: Payer: Self-pay | Admitting: Family Medicine

## 2019-04-19 MED ORDER — LEVOTHYROXINE SODIUM 75 MCG PO TABS: 75.0000 ug | ORAL_TABLET | Freq: Every day | ORAL | 0 refills | Status: AC

## 2019-04-19 NOTE — Progress Notes (Signed)
Patient denies any concerns today.  

## 2019-04-20 ENCOUNTER — Inpatient Hospital Stay: Payer: Self-pay

## 2019-04-20 ENCOUNTER — Other Ambulatory Visit: Payer: Self-pay | Admitting: Oncology

## 2019-04-20 ENCOUNTER — Encounter: Payer: Self-pay | Admitting: Oncology

## 2019-04-20 ENCOUNTER — Other Ambulatory Visit: Payer: Self-pay

## 2019-04-20 ENCOUNTER — Inpatient Hospital Stay (HOSPITAL_BASED_OUTPATIENT_CLINIC_OR_DEPARTMENT_OTHER): Payer: Self-pay | Admitting: Oncology

## 2019-04-20 ENCOUNTER — Inpatient Hospital Stay: Payer: Self-pay | Attending: Oncology

## 2019-04-20 VITALS — BP 102/65 | HR 101 | Temp 95.5°F | Resp 16 | Wt 97.9 lb

## 2019-04-20 DIAGNOSIS — G629 Polyneuropathy, unspecified: Secondary | ICD-10-CM | POA: Insufficient documentation

## 2019-04-20 DIAGNOSIS — Z5112 Encounter for antineoplastic immunotherapy: Secondary | ICD-10-CM | POA: Insufficient documentation

## 2019-04-20 DIAGNOSIS — Z87891 Personal history of nicotine dependence: Secondary | ICD-10-CM | POA: Insufficient documentation

## 2019-04-20 DIAGNOSIS — E785 Hyperlipidemia, unspecified: Secondary | ICD-10-CM | POA: Insufficient documentation

## 2019-04-20 DIAGNOSIS — C3411 Malignant neoplasm of upper lobe, right bronchus or lung: Secondary | ICD-10-CM | POA: Insufficient documentation

## 2019-04-20 DIAGNOSIS — N189 Chronic kidney disease, unspecified: Secondary | ICD-10-CM | POA: Insufficient documentation

## 2019-04-20 DIAGNOSIS — R531 Weakness: Secondary | ICD-10-CM | POA: Insufficient documentation

## 2019-04-20 DIAGNOSIS — E039 Hypothyroidism, unspecified: Secondary | ICD-10-CM | POA: Insufficient documentation

## 2019-04-20 DIAGNOSIS — Z5111 Encounter for antineoplastic chemotherapy: Secondary | ICD-10-CM | POA: Insufficient documentation

## 2019-04-20 DIAGNOSIS — C787 Secondary malignant neoplasm of liver and intrahepatic bile duct: Secondary | ICD-10-CM | POA: Insufficient documentation

## 2019-04-20 DIAGNOSIS — Z79899 Other long term (current) drug therapy: Secondary | ICD-10-CM | POA: Insufficient documentation

## 2019-04-20 DIAGNOSIS — R5383 Other fatigue: Secondary | ICD-10-CM | POA: Insufficient documentation

## 2019-04-20 DIAGNOSIS — R5381 Other malaise: Secondary | ICD-10-CM | POA: Insufficient documentation

## 2019-04-20 DIAGNOSIS — D649 Anemia, unspecified: Secondary | ICD-10-CM | POA: Insufficient documentation

## 2019-04-20 DIAGNOSIS — I129 Hypertensive chronic kidney disease with stage 1 through stage 4 chronic kidney disease, or unspecified chronic kidney disease: Secondary | ICD-10-CM | POA: Insufficient documentation

## 2019-04-20 DIAGNOSIS — Z803 Family history of malignant neoplasm of breast: Secondary | ICD-10-CM | POA: Insufficient documentation

## 2019-04-20 DIAGNOSIS — C7951 Secondary malignant neoplasm of bone: Secondary | ICD-10-CM | POA: Insufficient documentation

## 2019-04-20 DIAGNOSIS — C3490 Malignant neoplasm of unspecified part of unspecified bronchus or lung: Secondary | ICD-10-CM

## 2019-04-20 LAB — COMPREHENSIVE METABOLIC PANEL
ALT: 11 U/L (ref 0–44)
AST: 34 U/L (ref 15–41)
Albumin: 3.1 g/dL — ABNORMAL LOW (ref 3.5–5.0)
Alkaline Phosphatase: 75 U/L (ref 38–126)
Anion gap: 9 (ref 5–15)
BUN: 21 mg/dL (ref 8–23)
CO2: 29 mmol/L (ref 22–32)
Calcium: 8.9 mg/dL (ref 8.9–10.3)
Chloride: 100 mmol/L (ref 98–111)
Creatinine, Ser: 1.44 mg/dL — ABNORMAL HIGH (ref 0.44–1.00)
GFR calc Af Amer: 45 mL/min — ABNORMAL LOW (ref >60)
GFR calc non Af Amer: 39 mL/min — ABNORMAL LOW (ref >60)
Glucose, Bld: 157 mg/dL — ABNORMAL HIGH (ref 70–99)
Potassium: 3.6 mmol/L (ref 3.5–5.1)
Sodium: 138 mmol/L (ref 135–145)
Total Bilirubin: 0.3 mg/dL (ref 0.3–1.2)
Total Protein: 6.5 g/dL (ref 6.5–8.1)

## 2019-04-20 LAB — CBC WITH DIFFERENTIAL/PLATELET
Abs Immature Granulocytes: 0.02 10*3/uL (ref 0.00–0.07)
Basophils Absolute: 0.1 10*3/uL (ref 0.0–0.1)
Basophils Relative: 1 %
Eosinophils Absolute: 0 10*3/uL (ref 0.0–0.5)
Eosinophils Relative: 0 %
HCT: 29.6 % — ABNORMAL LOW (ref 36.0–46.0)
Hemoglobin: 9.4 g/dL — ABNORMAL LOW (ref 12.0–15.0)
Immature Granulocytes: 0 %
Lymphocytes Relative: 15 %
Lymphs Abs: 1 10*3/uL (ref 0.7–4.0)
MCH: 33.5 pg (ref 26.0–34.0)
MCHC: 31.8 g/dL (ref 30.0–36.0)
MCV: 105.3 fL — ABNORMAL HIGH (ref 80.0–100.0)
Monocytes Absolute: 0.5 10*3/uL (ref 0.1–1.0)
Monocytes Relative: 7 %
Neutro Abs: 5 10*3/uL (ref 1.7–7.7)
Neutrophils Relative %: 77 %
Platelets: 257 10*3/uL (ref 150–400)
RBC: 2.81 MIL/uL — ABNORMAL LOW (ref 3.87–5.11)
RDW: 16.4 % — ABNORMAL HIGH (ref 11.5–15.5)
WBC: 6.5 10*3/uL (ref 4.0–10.5)
nRBC: 0 % (ref 0.0–0.2)

## 2019-04-20 LAB — SAMPLE TO BLOOD BANK

## 2019-04-20 LAB — PROTEIN, URINE, RANDOM: Total Protein, Urine: 56 mg/dL

## 2019-04-20 LAB — MAGNESIUM: Magnesium: 1.9 mg/dL (ref 1.7–2.4)

## 2019-04-20 MED ORDER — SODIUM CHLORIDE 0.9 % IV SOLN
Freq: Once | INTRAVENOUS | Status: AC
  Administered 2019-04-20: 10:00:00 via INTRAVENOUS
  Filled 2019-04-20: qty 250

## 2019-04-20 MED ORDER — PEGFILGRASTIM 6 MG/0.6ML ~~LOC~~ PSKT
6.0000 mg | PREFILLED_SYRINGE | Freq: Once | SUBCUTANEOUS | Status: AC
  Administered 2019-04-20: 6 mg via SUBCUTANEOUS
  Filled 2019-04-20: qty 0.6

## 2019-04-20 MED ORDER — SODIUM CHLORIDE 0.9% FLUSH
10.0000 mL | INTRAVENOUS | Status: DC | PRN
  Administered 2019-04-20: 10 mL via INTRAVENOUS
  Filled 2019-04-20: qty 10

## 2019-04-20 MED ORDER — ACETAMINOPHEN 160 MG/5ML PO SOLN
650.0000 mg | Freq: Once | ORAL | Status: AC
  Administered 2019-04-20: 650 mg via ORAL
  Filled 2019-04-20: qty 20.3

## 2019-04-20 MED ORDER — HEPARIN SOD (PORK) LOCK FLUSH 100 UNIT/ML IV SOLN
500.0000 [IU] | Freq: Once | INTRAVENOUS | Status: AC
  Administered 2019-04-20: 500 [IU] via INTRAVENOUS
  Filled 2019-04-20: qty 5

## 2019-04-20 MED ORDER — SODIUM CHLORIDE 0.9 % IV SOLN
75.0000 mg/m2 | Freq: Once | INTRAVENOUS | Status: AC
  Administered 2019-04-20: 110 mg via INTRAVENOUS
  Filled 2019-04-20: qty 11

## 2019-04-20 MED ORDER — DEXAMETHASONE SODIUM PHOSPHATE 10 MG/ML IJ SOLN
10.0000 mg | Freq: Once | INTRAMUSCULAR | Status: AC
  Administered 2019-04-20: 10 mg via INTRAVENOUS
  Filled 2019-04-20: qty 1

## 2019-04-20 MED ORDER — SODIUM CHLORIDE 0.9 % IV SOLN
430.0000 mg | Freq: Once | INTRAVENOUS | Status: AC
  Administered 2019-04-20: 430 mg via INTRAVENOUS
  Filled 2019-04-20: qty 43

## 2019-04-20 MED ORDER — DIPHENHYDRAMINE HCL 50 MG/ML IJ SOLN
25.0000 mg | Freq: Once | INTRAMUSCULAR | Status: AC
  Administered 2019-04-20: 25 mg via INTRAVENOUS
  Filled 2019-04-20: qty 1

## 2019-04-20 NOTE — Assessment & Plan Note (Signed)
D/c amlodipine, suspect no longer needed due to significant weight loss. Patient will check home readings and call if persistently elevated after d/c

## 2019-04-20 NOTE — Progress Notes (Signed)
Pt in for follow up, denies any changes since telephone pre assessment.

## 2019-04-20 NOTE — Assessment & Plan Note (Signed)
Recheck TSH, adjust synthroid dose as needed.

## 2019-04-20 NOTE — Assessment & Plan Note (Signed)
Off lipitor, recheck lipids and adjust as needed

## 2019-04-20 NOTE — Assessment & Plan Note (Signed)
Following closely with Oncology for treatments

## 2019-05-09 NOTE — Progress Notes (Signed)
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    INTERVAL HISTORY: Patient returns clinic today for further evaluation and consideration of cycle 8 of Taxotere and Cyramza.  Her peripheral neuropathy is unchanged and does not affect her day-to-day activity.  She continues to have chronic weakness and fatigue.  She has a fair appetite and her weight has remained relatively stable. She denies any pain. She has no neurologic complaints.  She denies any recent fevers or illnesses. She denies any chest pain, shortness of breath, hemoptysis, or cough.  She has no nausea, vomiting, constipation, or diarrhea.  She has no melena or hematochezia.  She has no urinary complaints.  Patient offers no further specific complaints today.  REVIEW OF SYSTEMS:   Review of Systems  Constitutional: Positive for malaise/fatigue. Negative for fever and weight loss.  Respiratory: Negative.  Negative for cough and shortness of breath.   Cardiovascular: Negative.  Negative for chest pain and leg swelling.  Gastrointestinal: Negative.  Negative for abdominal pain, blood in stool and melena.  Genitourinary: Negative.  Negative for dysuria and flank pain.  Musculoskeletal: Negative.  Negative for back pain and joint pain.  Skin: Negative.  Negative for rash.  Neurological: Positive for tingling, sensory change and weakness. Negative for dizziness, focal weakness and headaches.  Psychiatric/Behavioral: Negative.  Negative for depression. The patient is not nervous/anxious.     As per HPI. Otherwise, a complete review of systems is negative.  PAST MEDICAL HISTORY: Past Medical History:   Diagnosis Date  . Arthritis    right shoulder  . Cancer of right lung ([place]) 08/2017   Chemo + rad tx's.   Marland Kitchen Hyperlipidemia   . Hypertension   . Hypothyroidism   . Menopausal state   . Personal history of chemotherapy 2019   lung ca  . Personal history of radiation therapy 2019   lung ca  . Thyroid disease     PAST SURGICAL HISTORY: Past Surgical History:  Procedure Laterality Date  . ABDOMINAL HYSTERECTOMY  2005  . BREAST EXCISIONAL BIOPSY Right 1979   benign  . COLONOSCOPY WITH PROPOFOL N/A 05/13/2017   Procedure: COLONOSCOPY WITH PROPOFOL;  Surgeon: [name], MD;  Location: [facility] ENDOSCOPY;  Service: Gastroenterology;  Laterality: N/A;  . CYSTECTOMY Right    breast  . CYSTECTOMY  02/2015   back of neck  . ESOPHAGOGASTRODUODENOSCOPY (EGD) WITH PROPOFOL N/A 05/13/2017   Procedure: ESOPHAGOGASTRODUODENOSCOPY (EGD) WITH PROPOFOL;  Surgeon: [name], MD;  Location: [facility] ENDOSCOPY;  Service: Gastroenterology;  Laterality: N/A;  . PORTA CATH INSERTION N/A 09/01/2017   Procedure: PORTA CATH INSERTION;  Surgeon: Algernon Huxley, MD;  Location: [place] CV LAB;  Service: Cardiovascular;  Laterality: N/A;  . TUBAL LIGATION  1986    FAMILY HISTORY: Family History  Problem Relation Age of Onset  . Hypertension Mother   . Stroke Mother   . Leukemia Mother   . Stroke Father   . Pneumonia Father   . Diabetes Sister   . Hyperlipidemia Sister   . Breast cancer Maternal Aunt 76    ADVANCED DIRECTIVES (Y/N):  N  HEALTH MAINTENANCE: Social History   Tobacco Use  . Smoking status: Former Smoker  Packs/day: 0.25    Types: Cigarettes    Quit date: 02/01/2017    Years since quitting: 2.2  . Smokeless tobacco: Never Used  Substance Use Topics  . Alcohol use: No    Alcohol/week: 0.0 standard drinks  . Drug use: No     Colonoscopy:  PAP:  Bone density:  Lipid panel:  No Known Allergies  Current Outpatient Medications  Medication Sig Dispense Refill  . DULoxetine  (CYMBALTA) 30 MG capsule Take 1 capsule (30 mg total) by mouth daily. 30 capsule 3  . levothyroxine (SYNTHROID) 75 MCG tablet Take 1 tablet (75 mcg total) by mouth daily. 90 tablet 0  . lidocaine-prilocaine (EMLA) cream APPLY EXTERNALLY TO THE AFFECTED AREA 1 TIME 30 g 1  . Oxycodone HCl 10 MG TABS Take 1 tablet (10 mg total) by mouth every 6 (six) hours as needed. 120 tablet 0  . pantoprazole (PROTONIX) 40 MG tablet Take 1 tablet (40 mg total) by mouth daily. 30 tablet 0  . potassium chloride 20 MEQ/15ML (10%) SOLN Take 15 mLs (20 mEq total) by mouth 2 (two) times daily. 473 mL 0   No current facility-administered medications for this visit.    Facility-Administered Medications Ordered in Other Visits  Medication Dose Route Frequency Provider Last Rate Last Dose  . sodium chloride flush (NS) 0.9 % injection 10 mL  10 mL Intravenous PRN Lloyd Huger, MD   10 mL at 03/09/19 0830    OBJECTIVE: Vitals:   05/11/19 0940  BP: 108/73  Pulse: 91  Resp: 18  Temp: 97.6 F (36.4 C)     Body mass index is 19.82 kg/m.    ECOG FS:2 - Symptomatic, <50% confined to bed  General: Thin, no acute distress.  Sitting in a wheelchair. Eyes: Pink conjunctiva, anicteric sclera. HEENT: Normocephalic, moist mucous membranes. Lungs: Clear to auscultation bilaterally. Heart: Regular rate and rhythm. No rubs, murmurs, or gallops. Abdomen: Soft, nontender, nondistended. No organomegaly noted, normoactive bowel sounds. Musculoskeletal: No edema, cyanosis, or clubbing. Neuro: Alert, answering all questions appropriately. Cranial nerves grossly intact. Skin: No rashes or petechiae noted. Psych: Normal affect.  LAB RESULTS:  Lab Results  Component Value Date   NA 135 05/11/2019   K 3.9 05/11/2019   CL 101 05/11/2019   CO2 27 05/11/2019   GLUCOSE 98 05/11/2019   BUN 28 (H) 05/11/2019   CREATININE 1.53 (H) 05/11/2019   CALCIUM 8.7 (L) 05/11/2019   PROT 6.3 (L) 05/11/2019   ALBUMIN 2.8 (L)  05/11/2019   AST 37 05/11/2019   ALT 17 05/11/2019   ALKPHOS 79 05/11/2019   BILITOT 0.3 05/11/2019   GFRNONAA 36 (L) 05/11/2019   GFRAA 42 (L) 05/11/2019    Lab Results  Component Value Date   WBC 7.1 05/11/2019   NEUTROABS 5.3 05/11/2019   HGB 8.7 (L) 05/11/2019   HCT 27.2 (L) 05/11/2019   MCV 106.3 (H) 05/11/2019   PLT 262 05/11/2019     STUDIES: No results found.  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     PLAN:    1. 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  CT scan results from March 04, 2019 reviewed independently with essentially stable disease with a mixed appearance of pulmonary nodules. MRI of her brain on January 09, 2019 did not reveal any metastatic disease.  Patient wishes to  continue with palliative treatment.  Proceed with cycle 8 of Taxotere and Cyramza today. Patient will also receive OnPro Neulasta support.  Return to clinic in 3 weeks for further evaluation and consideration of cycle 9.  Will reimage with CT scan 1 to 2 days prior to next treatment.   2.  Anemia: Hemoglobin has trended down slightly to 8.7, monitor. 3.  Pain: Patient does not complain of pain today.  Continue current narcotic regimen as prescribed. 4.  Palliative care: Appreciate input. 5.  Left shoulder mobility: Appreciate rehab input. 6.  Hypokalemia: Resolved.  Continue oral supplementation. 7.  Dysphasia: Patient does not complain of this today.  Swallow study and CT scan of the neck are negative.  Continue follow-up with GI.  MRI of the brain was negative as above.  Patient was also empirically given fluconazole for possible candidiasis.  8.  Chronic renal insufficiency: Creatinine is slightly worse at 1.53.  Encouraged improved fluid intake. 9.  Hypomagnesia: Resolved. 10.  Bony metastasis: Continue Zometa on  even-numbered cycles.  Patient expressed understanding and was in agreement with this plan. She also understands that She can call clinic at any time with any questions, concerns, or complaints.   Cancer Staging Cancer of upper lobe of right lung [facility]) Staging form: Lung, AJCC 8th Edition - Clinical stage from 08/24/2017: Stage IV (cT2a, cN3, cM1c) - Signed by Lloyd Huger, MD on 03/29/2018   Lloyd Huger, MD   05/12/2019 6:15 AM

## 2019-05-11 ENCOUNTER — Inpatient Hospital Stay (HOSPITAL_BASED_OUTPATIENT_CLINIC_OR_DEPARTMENT_OTHER): Payer: Self-pay | Admitting: Oncology

## 2019-05-11 ENCOUNTER — Other Ambulatory Visit: Payer: Self-pay

## 2019-05-11 ENCOUNTER — Inpatient Hospital Stay: Payer: Self-pay | Attending: Oncology | Admitting: *Deleted

## 2019-05-11 ENCOUNTER — Encounter: Payer: Self-pay | Admitting: Oncology

## 2019-05-11 ENCOUNTER — Inpatient Hospital Stay: Payer: Self-pay

## 2019-05-11 VITALS — BP 108/73 | HR 91 | Temp 97.6°F | Resp 18 | Wt 101.5 lb

## 2019-05-11 DIAGNOSIS — Z95828 Presence of other vascular implants and grafts: Secondary | ICD-10-CM

## 2019-05-11 DIAGNOSIS — C3490 Malignant neoplasm of unspecified part of unspecified bronchus or lung: Secondary | ICD-10-CM

## 2019-05-11 DIAGNOSIS — C7801 Secondary malignant neoplasm of right lung: Secondary | ICD-10-CM | POA: Insufficient documentation

## 2019-05-11 DIAGNOSIS — D649 Anemia, unspecified: Secondary | ICD-10-CM | POA: Insufficient documentation

## 2019-05-11 DIAGNOSIS — R531 Weakness: Secondary | ICD-10-CM | POA: Insufficient documentation

## 2019-05-11 DIAGNOSIS — Z515 Encounter for palliative care: Secondary | ICD-10-CM | POA: Insufficient documentation

## 2019-05-11 DIAGNOSIS — Z87891 Personal history of nicotine dependence: Secondary | ICD-10-CM | POA: Insufficient documentation

## 2019-05-11 DIAGNOSIS — N189 Chronic kidney disease, unspecified: Secondary | ICD-10-CM | POA: Insufficient documentation

## 2019-05-11 DIAGNOSIS — Z9221 Personal history of antineoplastic chemotherapy: Secondary | ICD-10-CM | POA: Insufficient documentation

## 2019-05-11 DIAGNOSIS — R5383 Other fatigue: Secondary | ICD-10-CM | POA: Insufficient documentation

## 2019-05-11 DIAGNOSIS — C3411 Malignant neoplasm of upper lobe, right bronchus or lung: Secondary | ICD-10-CM

## 2019-05-11 DIAGNOSIS — Z5112 Encounter for antineoplastic immunotherapy: Secondary | ICD-10-CM | POA: Insufficient documentation

## 2019-05-11 DIAGNOSIS — Z923 Personal history of irradiation: Secondary | ICD-10-CM | POA: Insufficient documentation

## 2019-05-11 DIAGNOSIS — Z803 Family history of malignant neoplasm of breast: Secondary | ICD-10-CM | POA: Insufficient documentation

## 2019-05-11 DIAGNOSIS — C787 Secondary malignant neoplasm of liver and intrahepatic bile duct: Secondary | ICD-10-CM | POA: Insufficient documentation

## 2019-05-11 DIAGNOSIS — E785 Hyperlipidemia, unspecified: Secondary | ICD-10-CM | POA: Insufficient documentation

## 2019-05-11 DIAGNOSIS — R5381 Other malaise: Secondary | ICD-10-CM | POA: Insufficient documentation

## 2019-05-11 DIAGNOSIS — E039 Hypothyroidism, unspecified: Secondary | ICD-10-CM | POA: Insufficient documentation

## 2019-05-11 DIAGNOSIS — Z79899 Other long term (current) drug therapy: Secondary | ICD-10-CM | POA: Insufficient documentation

## 2019-05-11 DIAGNOSIS — Z7689 Persons encountering health services in other specified circumstances: Secondary | ICD-10-CM | POA: Insufficient documentation

## 2019-05-11 DIAGNOSIS — I129 Hypertensive chronic kidney disease with stage 1 through stage 4 chronic kidney disease, or unspecified chronic kidney disease: Secondary | ICD-10-CM | POA: Insufficient documentation

## 2019-05-11 DIAGNOSIS — C7951 Secondary malignant neoplasm of bone: Secondary | ICD-10-CM | POA: Insufficient documentation

## 2019-05-11 DIAGNOSIS — M1990 Unspecified osteoarthritis, unspecified site: Secondary | ICD-10-CM | POA: Insufficient documentation

## 2019-05-11 DIAGNOSIS — G629 Polyneuropathy, unspecified: Secondary | ICD-10-CM | POA: Insufficient documentation

## 2019-05-11 LAB — COMPREHENSIVE METABOLIC PANEL
ALT: 17 U/L (ref 0–44)
AST: 37 U/L (ref 15–41)
Albumin: 2.8 g/dL — ABNORMAL LOW (ref 3.5–5.0)
Alkaline Phosphatase: 79 U/L (ref 38–126)
Anion gap: 7 (ref 5–15)
BUN: 28 mg/dL — ABNORMAL HIGH (ref 8–23)
CO2: 27 mmol/L (ref 22–32)
Calcium: 8.7 mg/dL — ABNORMAL LOW (ref 8.9–10.3)
Chloride: 101 mmol/L (ref 98–111)
Creatinine, Ser: 1.53 mg/dL — ABNORMAL HIGH (ref 0.44–1.00)
GFR calc Af Amer: 42 mL/min — ABNORMAL LOW (ref >60)
GFR calc non Af Amer: 36 mL/min — ABNORMAL LOW (ref >60)
Glucose, Bld: 98 mg/dL (ref 70–99)
Potassium: 3.9 mmol/L (ref 3.5–5.1)
Sodium: 135 mmol/L (ref 135–145)
Total Bilirubin: 0.3 mg/dL (ref 0.3–1.2)
Total Protein: 6.3 g/dL — ABNORMAL LOW (ref 6.5–8.1)

## 2019-05-11 LAB — CBC WITH DIFFERENTIAL/PLATELET
Abs Immature Granulocytes: 0.03 10*3/uL (ref 0.00–0.07)
Basophils Absolute: 0.1 10*3/uL (ref 0.0–0.1)
Basophils Relative: 1 %
Eosinophils Absolute: 0 10*3/uL (ref 0.0–0.5)
Eosinophils Relative: 0 %
HCT: 27.2 % — ABNORMAL LOW (ref 36.0–46.0)
Hemoglobin: 8.7 g/dL — ABNORMAL LOW (ref 12.0–15.0)
Immature Granulocytes: 0 %
Lymphocytes Relative: 15 %
Lymphs Abs: 1.1 10*3/uL (ref 0.7–4.0)
MCH: 34 pg (ref 26.0–34.0)
MCHC: 32 g/dL (ref 30.0–36.0)
MCV: 106.3 fL — ABNORMAL HIGH (ref 80.0–100.0)
Monocytes Absolute: 0.7 10*3/uL (ref 0.1–1.0)
Monocytes Relative: 10 %
Neutro Abs: 5.3 10*3/uL (ref 1.7–7.7)
Neutrophils Relative %: 74 %
Platelets: 262 10*3/uL (ref 150–400)
RBC: 2.56 MIL/uL — ABNORMAL LOW (ref 3.87–5.11)
RDW: 17.2 % — ABNORMAL HIGH (ref 11.5–15.5)
WBC: 7.1 10*3/uL (ref 4.0–10.5)
nRBC: 0 % (ref 0.0–0.2)

## 2019-05-11 LAB — PROTEIN, URINE, RANDOM: Total Protein, Urine: 57 mg/dL

## 2019-05-11 LAB — MAGNESIUM: Magnesium: 2.2 mg/dL (ref 1.7–2.4)

## 2019-05-11 MED ORDER — HEPARIN SOD (PORK) LOCK FLUSH 100 UNIT/ML IV SOLN
500.0000 [IU] | Freq: Once | INTRAVENOUS | Status: AC | PRN
  Administered 2019-05-11: 15:00:00 500 [IU]
  Filled 2019-05-11: qty 5

## 2019-05-11 MED ORDER — SODIUM CHLORIDE 0.9 % IV SOLN
Freq: Once | INTRAVENOUS | Status: AC
  Administered 2019-05-11: 11:00:00 via INTRAVENOUS
  Filled 2019-05-11: qty 250

## 2019-05-11 MED ORDER — SODIUM CHLORIDE 0.9 % IV SOLN
75.0000 mg/m2 | Freq: Once | INTRAVENOUS | Status: AC
  Administered 2019-05-11: 110 mg via INTRAVENOUS
  Filled 2019-05-11: qty 11

## 2019-05-11 MED ORDER — SODIUM CHLORIDE 0.9 % IV SOLN
430.0000 mg | Freq: Once | INTRAVENOUS | Status: AC
  Administered 2019-05-11: 430 mg via INTRAVENOUS
  Filled 2019-05-11: qty 43

## 2019-05-11 MED ORDER — DEXAMETHASONE SODIUM PHOSPHATE 10 MG/ML IJ SOLN
10.0000 mg | Freq: Once | INTRAMUSCULAR | Status: AC
  Administered 2019-05-11: 12:00:00 10 mg via INTRAVENOUS
  Filled 2019-05-11: qty 1

## 2019-05-11 MED ORDER — ZOLEDRONIC ACID 4 MG/100ML IV SOLN
4.0000 mg | Freq: Once | INTRAVENOUS | Status: AC
  Administered 2019-05-11: 4 mg via INTRAVENOUS
  Filled 2019-05-11: qty 100

## 2019-05-11 MED ORDER — ACETAMINOPHEN 160 MG/5ML PO SOLN
650.0000 mg | Freq: Once | ORAL | Status: AC
  Administered 2019-05-11: 12:00:00 650 mg via ORAL
  Filled 2019-05-11: qty 20.3

## 2019-05-11 MED ORDER — PEGFILGRASTIM 6 MG/0.6ML ~~LOC~~ PSKT
6.0000 mg | PREFILLED_SYRINGE | Freq: Once | SUBCUTANEOUS | Status: AC
  Administered 2019-05-11: 6 mg via SUBCUTANEOUS
  Filled 2019-05-11: qty 0.6

## 2019-05-11 MED ORDER — SODIUM CHLORIDE 0.9% FLUSH
10.0000 mL | Freq: Once | INTRAVENOUS | Status: AC
  Administered 2019-05-11: 10 mL via INTRAVENOUS
  Filled 2019-05-11: qty 10

## 2019-05-11 MED ORDER — DIPHENHYDRAMINE HCL 50 MG/ML IJ SOLN
25.0000 mg | Freq: Once | INTRAMUSCULAR | Status: AC
  Administered 2019-05-11: 12:00:00 25 mg via INTRAVENOUS
  Filled 2019-05-11: qty 1

## 2019-05-11 NOTE — Progress Notes (Signed)
Reviewed labs with MD and treatment team. Verbal order to continue with treatment and zometa infusion today. Pt updated and all questions answered at this time.   Tigran Haynie [employer]

## 2019-05-11 NOTE — Progress Notes (Signed)
Pt in for follow up, denies any concerns today. 

## 2019-05-28 ENCOUNTER — Other Ambulatory Visit: Payer: Self-pay

## 2019-05-28 ENCOUNTER — Ambulatory Visit
Admission: RE | Admit: 2019-05-28 | Discharge: 2019-05-28 | Disposition: A | Discharge status: Home / Self Care | Payer: Self-pay | Source: Ambulatory Visit | Attending: Oncology | Admitting: Oncology

## 2019-05-28 DIAGNOSIS — C3411 Malignant neoplasm of upper lobe, right bronchus or lung: Secondary | ICD-10-CM | POA: Insufficient documentation

## 2019-05-28 MED ORDER — IOHEXOL 300 MG/ML  SOLN
60.0000 mL | Freq: Once | INTRAMUSCULAR | Status: AC | PRN
  Administered 2019-05-28: 60 mL via INTRAVENOUS

## 2019-05-30 NOTE — Progress Notes (Signed)
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    INTERVAL HISTORY: Patient returns to clinic today for further evaluation and discussion of her imaging results.  She continues to have chronic weakness and fatigue and a decreased performance status.  Her peripheral neuropathy is unchanged.  She has no other neurologic complaints.  She has a fair appetite and her weight has remained relatively stable. She denies any pain. She denies any recent fevers or illnesses. She denies any chest pain, shortness of breath, hemoptysis, or cough.  She has no nausea, vomiting, constipation, or diarrhea.  She has no melena or hematochezia.  She has no urinary complaints.  Patient offers no further specific complaints today.  REVIEW OF SYSTEMS:   Review of Systems  Constitutional: Positive for malaise/fatigue. Negative for fever and weight loss.  Respiratory: Negative.  Negative for cough and shortness of breath.   Cardiovascular: Negative.  Negative for chest pain and leg swelling.  Gastrointestinal: Negative.  Negative for abdominal pain, blood in stool and melena.  Genitourinary: Negative.  Negative for dysuria and flank pain.  Musculoskeletal: Negative.  Negative for back pain and joint pain.  Skin: Negative.  Negative for rash.  Neurological: Positive for tingling, sensory change and weakness. Negative for dizziness, focal weakness and headaches.  Psychiatric/Behavioral: Negative.  Negative for depression. The patient is not nervous/anxious.     As per HPI. Otherwise, a complete review of systems is negative.  PAST MEDICAL HISTORY: Past Medical History:  Diagnosis Date     Arthritis    right shoulder   Cancer of right lung ([place]) 08/2017   Chemo + rad tx's.    Hyperlipidemia    Hypertension    Hypothyroidism    Menopausal state    Personal history of chemotherapy 2019   lung ca   Personal history of radiation therapy 2019   lung ca   Thyroid disease     PAST SURGICAL HISTORY: Past Surgical History:  Procedure Laterality Date   ABDOMINAL HYSTERECTOMY  2005   BREAST EXCISIONAL BIOPSY Right 1979   benign   COLONOSCOPY WITH PROPOFOL N/A 05/13/2017   Procedure: COLONOSCOPY WITH PROPOFOL;  Surgeon: [name], MD;  Location: [facility] ENDOSCOPY;  Service: Gastroenterology;  Laterality: N/A;   CYSTECTOMY Right    breast   CYSTECTOMY  02/2015   back of neck   ESOPHAGOGASTRODUODENOSCOPY (EGD) WITH PROPOFOL N/A 05/13/2017   Procedure: ESOPHAGOGASTRODUODENOSCOPY (EGD) WITH PROPOFOL;  Surgeon: [name], MD;  Location[facility] ENDOSCOPY;  Service: Gastroenterology;  Laterality: N/A;   PORTA CATH INSERTION N/A 09/01/2017   Procedure: PORTA CATH INSERTION;  Surgeon: Algernon Huxley, MD;  Location: [place] CV LAB;  Service: Cardiovascular;  Laterality: N/A;   TUBAL LIGATION  1986    FAMILY HISTORY: Family History  Problem Relation Age of Onset   Hypertension Mother    Stroke Mother    Leukemia Mother    Stroke Father    Pneumonia Father    Diabetes Sister    Hyperlipidemia Sister    Breast cancer Maternal Aunt 65    ADVANCED DIRECTIVES (Y/N):  N  HEALTH MAINTENANCE: Social History   Tobacco Use   Smoking status: Former Smoker    Packs/day:  0.25    Types: Cigarettes    Quit date: 02/01/2017    Years since quitting: 2.3   Smokeless tobacco: Never Used  Substance Use Topics   Alcohol use: No    Alcohol/week: 0.0 standard drinks   Drug use: No     Colonoscopy:  PAP:  Bone density:  Lipid panel:  No Known Allergies  Current Outpatient Medications  Medication Sig Dispense Refill   DULoxetine (CYMBALTA) 30 MG  capsule Take 1 capsule (30 mg total) by mouth daily. 30 capsule 3   lidocaine-prilocaine (EMLA) cream APPLY EXTERNALLY TO THE AFFECTED AREA 1 TIME 30 g 1   potassium chloride 20 MEQ/15ML (10%) SOLN Take 15 mLs (20 mEq total) by mouth 2 (two) times daily. 473 mL 0   levothyroxine (SYNTHROID) 75 MCG tablet Take 1 tablet (75 mcg total) by mouth daily. (Patient not taking: Reported on 05/31/2019) 90 tablet 0   Oxycodone HCl 10 MG TABS Take 1 tablet (10 mg total) by mouth every 6 (six) hours as needed. (Patient not taking: Reported on 05/31/2019) 120 tablet 0   pantoprazole (PROTONIX) 40 MG tablet Take 1 tablet (40 mg total) by mouth daily. (Patient not taking: Reported on 05/31/2019) 30 tablet 0   No current facility-administered medications for this visit.    Facility-Administered Medications Ordered in Other Visits  Medication Dose Route Frequency Provider Last Rate Last Dose   sodium chloride flush (NS) 0.9 % injection 10 mL  10 mL Intravenous PRN Lloyd Huger, MD   10 mL at 03/09/19 0830    OBJECTIVE: Vitals:   06/01/19 0944  BP: 104/69  Pulse: 90  Resp: 16  Temp: (!) 96.4 F (35.8 C)     Body mass index is 20.6 kg/m.    ECOG FS:2 - Symptomatic, <50% confined to bed  General: Thin, no acute distress.  Sitting in a wheelchair. Eyes: Pink conjunctiva, anicteric sclera. HEENT: Normocephalic, moist mucous membranes. Lungs: Clear to auscultation bilaterally. Heart: Regular rate and rhythm. No rubs, murmurs, or gallops. Abdomen: Soft, nontender, nondistended. No organomegaly noted, normoactive bowel sounds. Musculoskeletal: No edema, cyanosis, or clubbing. Neuro: Alert, answering all questions appropriately. Cranial nerves grossly intact. Skin: No rashes or petechiae noted. Psych: Normal affect.   LAB RESULTS:  Lab Results  Component Value Date   NA 133 (L) 06/01/2019   K 3.5 06/01/2019   CL 99 06/01/2019   CO2 25 06/01/2019   GLUCOSE 132 (H) 06/01/2019   BUN 24  (H) 06/01/2019   CREATININE 1.52 (H) 06/01/2019   CALCIUM 8.2 (L) 06/01/2019   PROT 5.7 (L) 06/01/2019   ALBUMIN 2.6 (L) 06/01/2019   AST 34 06/01/2019   ALT 15 06/01/2019   ALKPHOS 76 06/01/2019   BILITOT 0.3 06/01/2019   GFRNONAA 36 (L) 06/01/2019   GFRAA 42 (L) 06/01/2019    Lab Results  Component Value Date   WBC 7.8 06/01/2019   NEUTROABS 6.0 06/01/2019   HGB 9.1 (L) 06/01/2019   HCT 28.7 (L) 06/01/2019   MCV 107.5 (H) 06/01/2019   PLT 265 06/01/2019     STUDIES: Ct Chest W Contrast  Result Date: 05/28/2019 CLINICAL DATA:  63 year old female with history of lung cancer. Follow-up study. EXAM: CT CHEST, ABDOMEN, AND PELVIS WITH CONTRAST TECHNIQUE: Multidetector CT imaging of the chest, abdomen and pelvis was performed following the standard protocol during bolus administration of intravenous contrast. CONTRAST:  43mL OMNIPAQUE IOHEXOL 300 MG/ML  SOLN COMPARISON:  CT the chest, abdomen and pelvis 03/04/2019. FINDINGS:  CT CHEST FINDINGS Cardiovascular: Heart size is normal. There is no significant pericardial fluid, thickening or pericardial calcification. Aortic atherosclerosis. No definite coronary artery calcifications. Left internal jugular single-lumen porta cath with tip terminating in the right atrium. Mediastinum/Nodes: No pathologically enlarged mediastinal or hilar lymph nodes. Esophagus is unremarkable in appearance. No axillary lymphadenopathy. Lungs/Pleura: Compared to the prior examination, the postradiation changes have continue to evolve in the right lung with increasing mass-like areas of architectural distortion most evident in the paramediastinal aspect of the right upper and lower lobes, most compatible with evolving postradiation mass-like fibrosis. New bilateral pleural effusions lying dependently, moderate in size. Increased number and size of numerous pulmonary nodules scattered throughout the lungs bilaterally, largest of which measures 1.4 x 1.3 cm in the right  lower lobe (axial image 87 of series 505), which previously measured only 1.0 x 0.8 cm on 03/04/2019. Dependent areas of atelectasis in the lower lobes of the lungs bilaterally. Musculoskeletal: There are no aggressive appearing lytic or blastic lesions noted in the visualized portions of the skeleton. CT ABDOMEN PELVIS FINDINGS Hepatobiliary: 2 hypovascular hepatic lesions are again noted. The largest of these in segment 7 of the liver has increased in size, currently measuring 4.3 x 2.4 cm (axial image 43 of series 504). The smaller lesion in segment 4B of the liver (axial image 48 of series 504) has decreased in size, currently measuring 1.3 x 1.0 cm. No new hepatic lesions are otherwise noted. No intra or extrahepatic biliary ductal dilatation. Gallbladder is normal in appearance. Pancreas: No pancreatic mass. No pancreatic ductal dilatation. No pancreatic or peripancreatic fluid collections or inflammatory changes. Spleen: Unremarkable. Adrenals/Urinary Tract: Bilateral kidneys are normal in appearance. Right adrenal gland is normal in appearance in the left adrenal gland there is a new 1.8 x 0.9 cm area of thickening (axial image 49 of series 504), suspicious for potential metastatic lesion. No hydroureteronephrosis. Urinary bladder is normal in appearance. Stomach/Bowel: Normal appearance of the stomach. No pathologic dilatation of small bowel or colon. Normal appendix. Vascular/Lymphatic: Aortic atherosclerosis, without evidence of aneurysm or dissection in the abdominal or pelvic vasculature. No lymphadenopathy noted in the abdomen or pelvis. Reproductive: Status post hysterectomy. Ovaries are not confidently identified may be surgically absent or trophic. Other: Small volume of ascites, new compared to the prior study. No pneumoperitoneum. Musculoskeletal: There are no aggressive appearing lytic or blastic lesions noted in the visualized portions of the skeleton. IMPRESSION: 1. Evolving postradiation  changes in the right lung, with significant increased number and size of pulmonary nodules scattered throughout the lungs bilaterally, concerning for widespread metastatic disease. 2. New moderate bilateral pleural effusions and small volume of ascites. 3. Previously noted hepatic lesions demonstrate a mixed response, with interval growth of the largest lesion and decrease in size of the smaller lesion. 4. New area of nodular thickening in the medial limb of the left adrenal gland suspicious for a new metastatic lesion. Attention on follow-up studies is recommended. 5. Aortic atherosclerosis. 6. Additional incidental findings, as above. Electronically Signed   By: Vinnie Langton M.D.   On: 05/28/2019 10:36   Ct Abdomen Pelvis W Contrast  Result Date: 05/28/2019 CLINICAL DATA:  63 year old female with history of lung cancer. Follow-up study. EXAM: CT CHEST, ABDOMEN, AND PELVIS WITH CONTRAST TECHNIQUE: Multidetector CT imaging of the chest, abdomen and pelvis was performed following the standard protocol during bolus administration of intravenous contrast. CONTRAST:  72mL OMNIPAQUE IOHEXOL 300 MG/ML  SOLN COMPARISON:  CT the chest, abdomen and  pelvis 03/04/2019. FINDINGS: CT CHEST FINDINGS Cardiovascular: Heart size is normal. There is no significant pericardial fluid, thickening or pericardial calcification. Aortic atherosclerosis. No definite coronary artery calcifications. Left internal jugular single-lumen porta cath with tip terminating in the right atrium. Mediastinum/Nodes: No pathologically enlarged mediastinal or hilar lymph nodes. Esophagus is unremarkable in appearance. No axillary lymphadenopathy. Lungs/Pleura: Compared to the prior examination, the postradiation changes have continue to evolve in the right lung with increasing mass-like areas of architectural distortion most evident in the paramediastinal aspect of the right upper and lower lobes, most compatible with evolving postradiation  mass-like fibrosis. New bilateral pleural effusions lying dependently, moderate in size. Increased number and size of numerous pulmonary nodules scattered throughout the lungs bilaterally, largest of which measures 1.4 x 1.3 cm in the right lower lobe (axial image 87 of series 505), which previously measured only 1.0 x 0.8 cm on 03/04/2019. Dependent areas of atelectasis in the lower lobes of the lungs bilaterally. Musculoskeletal: There are no aggressive appearing lytic or blastic lesions noted in the visualized portions of the skeleton. CT ABDOMEN PELVIS FINDINGS Hepatobiliary: 2 hypovascular hepatic lesions are again noted. The largest of these in segment 7 of the liver has increased in size, currently measuring 4.3 x 2.4 cm (axial image 43 of series 504). The smaller lesion in segment 4B of the liver (axial image 48 of series 504) has decreased in size, currently measuring 1.3 x 1.0 cm. No new hepatic lesions are otherwise noted. No intra or extrahepatic biliary ductal dilatation. Gallbladder is normal in appearance. Pancreas: No pancreatic mass. No pancreatic ductal dilatation. No pancreatic or peripancreatic fluid collections or inflammatory changes. Spleen: Unremarkable. Adrenals/Urinary Tract: Bilateral kidneys are normal in appearance. Right adrenal gland is normal in appearance in the left adrenal gland there is a new 1.8 x 0.9 cm area of thickening (axial image 49 of series 504), suspicious for potential metastatic lesion. No hydroureteronephrosis. Urinary bladder is normal in appearance. Stomach/Bowel: Normal appearance of the stomach. No pathologic dilatation of small bowel or colon. Normal appendix. Vascular/Lymphatic: Aortic atherosclerosis, without evidence of aneurysm or dissection in the abdominal or pelvic vasculature. No lymphadenopathy noted in the abdomen or pelvis. Reproductive: Status post hysterectomy. Ovaries are not confidently identified may be surgically absent or trophic. Other: Small  volume of ascites, new compared to the prior study. No pneumoperitoneum. Musculoskeletal: There are no aggressive appearing lytic or blastic lesions noted in the visualized portions of the skeleton. IMPRESSION: 1. Evolving postradiation changes in the right lung, with significant increased number and size of pulmonary nodules scattered throughout the lungs bilaterally, concerning for widespread metastatic disease. 2. New moderate bilateral pleural effusions and small volume of ascites. 3. Previously noted hepatic lesions demonstrate a mixed response, with interval growth of the largest lesion and decrease in size of the smaller lesion. 4. New area of nodular thickening in the medial limb of the left adrenal gland suspicious for a new metastatic lesion. Attention on follow-up studies is recommended. 5. Aortic atherosclerosis. 6. Additional incidental findings, as above. Electronically Signed   By: Vinnie Langton M.D.   On: 05/28/2019 10:36   ONCOLOGY TREATMENT HISTORY: Patient initiated treatment with concurrent weekly carboplatin and Taxol along with XRT from September 09, 2017 through October 07, 2017.  She then received maintenance Imfinzi from November 04, 2017 through March 10, 2018 at which point she was noted to have progressive disease.  Treatment was then switched to carboplatinum, pemetrexed, and Avastin which she received between April 01, 2018  and August 25, 2018.  She then received maintenance pemetrexed and Avastin from September 15, 2018 through November 23, 2018 at which point she was noted to have progressive disease once again.  She was initiated on third line treatment with Taxotere and Cyramza on Dec 15, 2018 this was discontinued on May 11, 2019 secondary to progressive disease.  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     PLAN:    1. 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  See treatment history above.  CT scan results from May 28, 2019 reviewed progressive disease yet again.  Previously, MRI of her brain on January 09, 2019 did not reveal any metastatic disease.  We had a lengthy discussion regarding the possibility of transitioning to hospice care or if patient would like to try 1 final line of treatment.  She is likely leaning towards more treatment, but plans to further discuss with her family.  Plan using vinorelbine on days 1 and 8 with a 15 off if patient chooses treatment.  Return to clinic in 1 week for further evaluation and beginning of treatment if desired.  Appreciate palliative care input. 2.  Anemia: Hemoglobin slightly improved to 9.1.  Monitor. 3.  Pain: Patient does not complain of pain today.  Continue current narcotic regimen as prescribed. 4.  Left shoulder mobility: Appreciate rehab input. 5.  Hypokalemia: Resolved.  Continue oral supplementation. 6.  Dysphasia: Patient does not complain of this today.  Swallow study and CT scan of the neck are negative.  Continue follow-up with GI.  MRI of the brain was negative as above.  Patient was also empirically given fluconazole for possible candidiasis.  7.  Chronic renal insufficiency: Creatinine remains elevated, but unchanged at 1.52. 9.  Hypomagnesia: Resolved. 10.  Bony metastasis: Patient last received Zometa on May 11, 2019.  Patient expressed understanding and was in agreement with this plan. She also understands that She can call clinic at any time with any questions, concerns, or complaints.   Cancer Staging Cancer of upper lobe of right lung [facility]) Staging form: Lung, AJCC 8th Edition - Clinical stage from 08/24/2017: Stage IV (cT2a, cN3, cM1c) - Signed by Lloyd Huger, MD on 03/29/2018   Lloyd Huger, MD   06/01/2019 1:59 PM

## 2019-05-31 ENCOUNTER — Other Ambulatory Visit: Payer: Self-pay

## 2019-05-31 ENCOUNTER — Encounter: Payer: Self-pay | Admitting: Oncology

## 2019-05-31 NOTE — Progress Notes (Signed)
Patient stated that she had been doing well with no complaints. 

## 2019-06-01 ENCOUNTER — Inpatient Hospital Stay (HOSPITAL_BASED_OUTPATIENT_CLINIC_OR_DEPARTMENT_OTHER): Payer: Self-pay | Admitting: Oncology

## 2019-06-01 ENCOUNTER — Inpatient Hospital Stay (HOSPITAL_BASED_OUTPATIENT_CLINIC_OR_DEPARTMENT_OTHER): Payer: Self-pay | Admitting: Hospice and Palliative Medicine

## 2019-06-01 ENCOUNTER — Inpatient Hospital Stay: Payer: Self-pay

## 2019-06-01 ENCOUNTER — Other Ambulatory Visit: Payer: Self-pay

## 2019-06-01 VITALS — BP 104/69 | HR 90 | Temp 96.4°F | Resp 16 | Wt 105.5 lb

## 2019-06-01 DIAGNOSIS — C3411 Malignant neoplasm of upper lobe, right bronchus or lung: Secondary | ICD-10-CM

## 2019-06-01 DIAGNOSIS — Z515 Encounter for palliative care: Secondary | ICD-10-CM

## 2019-06-01 DIAGNOSIS — Z7189 Other specified counseling: Secondary | ICD-10-CM

## 2019-06-01 DIAGNOSIS — C3490 Malignant neoplasm of unspecified part of unspecified bronchus or lung: Secondary | ICD-10-CM

## 2019-06-01 LAB — CBC WITH DIFFERENTIAL/PLATELET
Abs Immature Granulocytes: 0.04 10*3/uL (ref 0.00–0.07)
Basophils Absolute: 0 10*3/uL (ref 0.0–0.1)
Basophils Relative: 0 %
Eosinophils Absolute: 0 10*3/uL (ref 0.0–0.5)
Eosinophils Relative: 0 %
HCT: 28.7 % — ABNORMAL LOW (ref 36.0–46.0)
Hemoglobin: 9.1 g/dL — ABNORMAL LOW (ref 12.0–15.0)
Immature Granulocytes: 1 %
Lymphocytes Relative: 13 %
Lymphs Abs: 1 10*3/uL (ref 0.7–4.0)
MCH: 34.1 pg — ABNORMAL HIGH (ref 26.0–34.0)
MCHC: 31.7 g/dL (ref 30.0–36.0)
MCV: 107.5 fL — ABNORMAL HIGH (ref 80.0–100.0)
Monocytes Absolute: 0.7 10*3/uL (ref 0.1–1.0)
Monocytes Relative: 9 %
Neutro Abs: 6 10*3/uL (ref 1.7–7.7)
Neutrophils Relative %: 77 %
Platelets: 265 10*3/uL (ref 150–400)
RBC: 2.67 MIL/uL — ABNORMAL LOW (ref 3.87–5.11)
RDW: 17.9 % — ABNORMAL HIGH (ref 11.5–15.5)
WBC: 7.8 10*3/uL (ref 4.0–10.5)
nRBC: 0 % (ref 0.0–0.2)

## 2019-06-01 LAB — COMPREHENSIVE METABOLIC PANEL
ALT: 15 U/L (ref 0–44)
AST: 34 U/L (ref 15–41)
Albumin: 2.6 g/dL — ABNORMAL LOW (ref 3.5–5.0)
Alkaline Phosphatase: 76 U/L (ref 38–126)
Anion gap: 9 (ref 5–15)
BUN: 24 mg/dL — ABNORMAL HIGH (ref 8–23)
CO2: 25 mmol/L (ref 22–32)
Calcium: 8.2 mg/dL — ABNORMAL LOW (ref 8.9–10.3)
Chloride: 99 mmol/L (ref 98–111)
Creatinine, Ser: 1.52 mg/dL — ABNORMAL HIGH (ref 0.44–1.00)
GFR calc Af Amer: 42 mL/min — ABNORMAL LOW (ref >60)
GFR calc non Af Amer: 36 mL/min — ABNORMAL LOW (ref >60)
Glucose, Bld: 132 mg/dL — ABNORMAL HIGH (ref 70–99)
Potassium: 3.5 mmol/L (ref 3.5–5.1)
Sodium: 133 mmol/L — ABNORMAL LOW (ref 135–145)
Total Bilirubin: 0.3 mg/dL (ref 0.3–1.2)
Total Protein: 5.7 g/dL — ABNORMAL LOW (ref 6.5–8.1)

## 2019-06-01 LAB — MAGNESIUM: Magnesium: 2.1 mg/dL (ref 1.7–2.4)

## 2019-06-01 MED ORDER — SODIUM CHLORIDE 0.9% FLUSH
10.0000 mL | Freq: Once | INTRAVENOUS | Status: AC
  Administered 2019-06-01: 09:00:00 10 mL via INTRAVENOUS
  Filled 2019-06-01: qty 10

## 2019-06-01 MED ORDER — HEPARIN SOD (PORK) LOCK FLUSH 100 UNIT/ML IV SOLN
500.0000 [IU] | Freq: Once | INTRAVENOUS | Status: AC
  Administered 2019-06-01: 500 [IU] via INTRAVENOUS
  Filled 2019-06-01: qty 5

## 2019-06-01 NOTE — Progress Notes (Signed)
[place]  [phone] [phone]   Name: Janet [name] Date: 06/01/2019 MRN: [id number]  DOB: [birth year]  Patient Care Team: Volney American, PA-C as PCP - General (Family Medicine) [name], RN as Registered Nurse    REASON FOR CONSULTATION: Palliative Care consult requested for this 63 y.o. 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  Palliative care was asked to help support patient through treatment and address goals of care.   SOCIAL HISTORY:    Patient is married but separated.  Patient lives with her sister.  She has a son and daughter.  Patient used to work on an Designer, television/film set.  ADVANCE DIRECTIVES:  Does not have  CODE STATUS: Full code  PAST MEDICAL HISTORY: Past Medical History:  Diagnosis Date   Arthritis    right shoulder   Cancer of right lung ([place]) 08/2017   Chemo + rad tx's.    Hyperlipidemia    Hypertension    Hypothyroidism    Menopausal state    Personal history of chemotherapy 2019   lung ca   Personal history of radiation therapy 2019   lung ca   Thyroid disease     PAST SURGICAL HISTORY:  Past Surgical History:  Procedure Laterality Date   ABDOMINAL HYSTERECTOMY  2005   BREAST EXCISIONAL BIOPSY Right 1979   benign   COLONOSCOPY WITH PROPOFOL N/A 05/13/2017   Procedure: COLONOSCOPY WITH PROPOFOL;  Surgeon: [name], MD;  Location: [facility] ENDOSCOPY;  Service: Gastroenterology;  Laterality: N/A;   CYSTECTOMY Right    breast   CYSTECTOMY  02/2015   back of neck   ESOPHAGOGASTRODUODENOSCOPY (EGD) WITH PROPOFOL N/A 05/13/2017   Procedure: ESOPHAGOGASTRODUODENOSCOPY (EGD) WITH PROPOFOL;  Surgeon: [name], MD;  Location: [facility] ENDOSCOPY;  Service: Gastroenterology;  Laterality: N/A;   PORTA  CATH INSERTION N/A 09/01/2017   Procedure: PORTA CATH INSERTION;  Surgeon: Algernon Huxley, MD;  Location: [facility];  Service: Cardiovascular;  Laterality: N/A;   TUBAL LIGATION  1986    HEMATOLOGY/ONCOLOGY HISTORY:  Oncology History  Cancer of upper lobe of right lung ([place])  07/09/2017 Initial Diagnosis   Cancer of upper lobe of right lung ([place])   04/01/2018 - 12/07/2018 Chemotherapy   The patient had palonosetron (ALOXI) injection 0.25 mg, 0.25 mg, Intravenous,  Once, 12 of 18 cycles Administration: 0.25 mg (04/01/2018), 0.25 mg (04/22/2018), 0.25 mg (05/13/2018), 0.25 mg (06/03/2018), 0.25 mg (06/23/2018), 0.25 mg (07/14/2018), 0.25 mg (08/04/2018), 0.25 mg (08/25/2018), 0.25 mg (09/15/2018), 0.25 mg (10/06/2018), 0.25 mg (10/27/2018), 0.25 mg (11/17/2018) bevacizumab (AVASTIN) 900 mg in sodium chloride 0.9 % 100 mL chemo infusion, 925 mg, Intravenous,  Once, 12 of 18 cycles Administration: 900 mg (04/01/2018), 900 mg (04/22/2018), 800 mg (06/03/2018), 800 mg (06/23/2018), 800 mg (07/14/2018), 800 mg (08/04/2018), 800 mg (08/25/2018), 800 mg (09/15/2018), 800 mg (10/06/2018), 800 mg (10/27/2018), 800 mg (11/17/2018) PEMEtrexed (ALIMTA) 800 mg in sodium chloride 0.9 % 100 mL chemo infusion, 800 mg, Intravenous,  Once, 12 of 18 cycles Administration: 800 mg (04/01/2018), 800 mg (04/22/2018), 800 mg (05/13/2018), 800 mg (06/03/2018), 800 mg (06/23/2018), 800 mg (07/14/2018), 800 mg (08/04/2018), 800 mg (08/25/2018), 800 mg (09/15/2018), 800 mg (10/06/2018), 800 mg (10/27/2018), 800 mg (11/17/2018) CARBOplatin (PARAPLATIN) 480 mg in sodium chloride 0.9 % 250 mL chemo infusion, 480 mg (100 %  of original dose 480.5 mg), Intravenous,  Once, 8 of 8 cycles Dose modification:   (original dose 480.5 mg, Cycle 1) Administration: 480 mg (04/01/2018), 480 mg (04/22/2018), 480 mg (05/13/2018), 400 mg (06/03/2018), 440 mg (06/23/2018), 440 mg (07/14/2018), 440 mg (08/04/2018), 430 mg (08/25/2018) fosaprepitant (EMEND) 150 mg,  dexamethasone (DECADRON) 12 mg in sodium chloride 0.9 % 145 mL IVPB, , Intravenous,  Once, 12 of 18 cycles Administration:  (04/01/2018),  (04/22/2018),  (05/13/2018),  (06/03/2018),  (06/23/2018),  (07/14/2018),  (08/04/2018),  (08/25/2018),  (09/15/2018),  (10/06/2018),  (10/27/2018),  (11/17/2018)  for chemotherapy treatment.    12/15/2018 - 05/31/2019 Chemotherapy   The patient had pegfilgrastim (NEULASTA ONPRO KIT) injection 6 mg, 6 mg, Subcutaneous, Once, 8 of 12 cycles Administration: 6 mg (12/15/2018), 6 mg (01/05/2019), 6 mg (01/26/2019), 6 mg (02/16/2019), 6 mg (03/09/2019), 6 mg (03/30/2019), 6 mg (04/20/2019), 6 mg (05/11/2019) DOCEtaxel (TAXOTERE) 110 mg in sodium chloride 0.9 % 250 mL chemo infusion, 75 mg/m2 = 110 mg, Intravenous,  Once, 8 of 12 cycles Administration: 110 mg (12/15/2018), 110 mg (01/05/2019), 110 mg (01/26/2019), 110 mg (02/16/2019), 110 mg (03/09/2019), 110 mg (03/30/2019), 110 mg (04/20/2019), 110 mg (05/11/2019) ramucirumab (CYRAMZA) 500 mg in sodium chloride 0.9 % 200 mL chemo infusion, 10 mg/kg = 500 mg, Intravenous, Once, 8 of 12 cycles Administration: 500 mg (12/15/2018), 500 mg (01/05/2019), 500 mg (01/26/2019), 430 mg (02/16/2019), 500 mg (03/09/2019), 430 mg (03/30/2019), 430 mg (04/20/2019), 430 mg (05/11/2019)  for chemotherapy treatment.      ALLERGIES:  has No Known Allergies.  MEDICATIONS:  Current Outpatient Medications  Medication Sig Dispense Refill   DULoxetine (CYMBALTA) 30 MG capsule Take 1 capsule (30 mg total) by mouth daily. 30 capsule 3   levothyroxine (SYNTHROID) 75 MCG tablet Take 1 tablet (75 mcg total) by mouth daily. (Patient not taking: Reported on 05/31/2019) 90 tablet 0   lidocaine-prilocaine (EMLA) cream APPLY EXTERNALLY TO THE AFFECTED AREA 1 TIME 30 g 1   Oxycodone HCl 10 MG TABS Take 1 tablet (10 mg total) by mouth every 6 (six) hours as needed. (Patient not taking: Reported on 05/31/2019) 120 tablet 0   pantoprazole (PROTONIX) 40 MG tablet Take 1 tablet (40  mg total) by mouth daily. (Patient not taking: Reported on 05/31/2019) 30 tablet 0   potassium chloride 20 MEQ/15ML (10%) SOLN Take 15 mLs (20 mEq total) by mouth 2 (two) times daily. 473 mL 0   No current facility-administered medications for this visit.    Facility-Administered Medications Ordered in Other Visits  Medication Dose Route Frequency Provider Last Rate Last Dose   sodium chloride flush (NS) 0.9 % injection 10 mL  10 mL Intravenous PRN Lloyd Huger, MD   10 mL at 03/09/19 0830    VITAL SIGNS: LMP 08/08/2003 (Approximate) Comment: Hysterectomy 2004 There were no vitals filed for this visit.  Estimated body mass index is 20.6 kg/m as calculated from the following:   Height as of 04/15/19: 5' (1.524 m).   Weight as of an earlier encounter on 06/01/19: 105 lb 8 oz (47.9 kg).  LABS: CBC:    Component Value Date/Time   WBC 7.8 06/01/2019 0926   HGB 9.1 (L) 06/01/2019 0926   HGB 10.8 (L) 07/09/2017 1615   HCT 28.7 (L) 06/01/2019 0926   HCT 32.7 (L) 07/09/2017 1615   PLT 265 06/01/2019 0926   PLT 394 (H) 07/09/2017 1615   MCV 107.5 (H) 06/01/2019 0926   MCV 92 07/09/2017 1615  NEUTROABS 6.0 06/01/2019 0926   NEUTROABS 2.5 07/09/2017 1615   LYMPHSABS 1.0 06/01/2019 0926   LYMPHSABS 3.1 07/09/2017 1615   MONOABS 0.7 06/01/2019 0926   EOSABS 0.0 06/01/2019 0926   EOSABS 0.1 07/09/2017 1615   BASOSABS 0.0 06/01/2019 0926   BASOSABS 0.0 07/09/2017 1615   Comprehensive Metabolic Panel:    Component Value Date/Time   NA 133 (L) 06/01/2019 0926   NA 141 04/15/2019 0903   K 3.5 06/01/2019 0926   CL 99 06/01/2019 0926   CO2 25 06/01/2019 0926   BUN 24 (H) 06/01/2019 0926   BUN 16 04/15/2019 0903   CREATININE 1.52 (H) 06/01/2019 0926   GLUCOSE 132 (H) 06/01/2019 0926   CALCIUM 8.2 (L) 06/01/2019 0926   AST 34 06/01/2019 0926   ALT 15 06/01/2019 0926   ALKPHOS 76 06/01/2019 0926   BILITOT 0.3 06/01/2019 0926   BILITOT 0.3 04/15/2019 0903   PROT 5.7 (L)  06/01/2019 0926   PROT 6.1 04/15/2019 0903   ALBUMIN 2.6 (L) 06/01/2019 0926   ALBUMIN 3.3 (L) 04/15/2019 0903    RADIOGRAPHIC STUDIES: Ct Chest W Contrast  Result Date: 05/28/2019 CLINICAL DATA:  64 year old female with history of lung cancer. Follow-up study. EXAM: CT CHEST, ABDOMEN, AND PELVIS WITH CONTRAST TECHNIQUE: Multidetector CT imaging of the chest, abdomen and pelvis was performed following the standard protocol during bolus administration of intravenous contrast. CONTRAST:  26m OMNIPAQUE IOHEXOL 300 MG/ML  SOLN COMPARISON:  CT the chest, abdomen and pelvis 03/04/2019. FINDINGS: CT CHEST FINDINGS Cardiovascular: Heart size is normal. There is no significant pericardial fluid, thickening or pericardial calcification. Aortic atherosclerosis. No definite coronary artery calcifications. Left internal jugular single-lumen porta cath with tip terminating in the right atrium. Mediastinum/Nodes: No pathologically enlarged mediastinal or hilar lymph nodes. Esophagus is unremarkable in appearance. No axillary lymphadenopathy. Lungs/Pleura: Compared to the prior examination, the postradiation changes have continue to evolve in the right lung with increasing mass-like areas of architectural distortion most evident in the paramediastinal aspect of the right upper and lower lobes, most compatible with evolving postradiation mass-like fibrosis. New bilateral pleural effusions lying dependently, moderate in size. Increased number and size of numerous pulmonary nodules scattered throughout the lungs bilaterally, largest of which measures 1.4 x 1.3 cm in the right lower lobe (axial image 87 of series 505), which previously measured only 1.0 x 0.8 cm on 03/04/2019. Dependent areas of atelectasis in the lower lobes of the lungs bilaterally. Musculoskeletal: There are no aggressive appearing lytic or blastic lesions noted in the visualized portions of the skeleton. CT ABDOMEN PELVIS FINDINGS Hepatobiliary: 2  hypovascular hepatic lesions are again noted. The largest of these in segment 7 of the liver has increased in size, currently measuring 4.3 x 2.4 cm (axial image 43 of series 504). The smaller lesion in segment 4B of the liver (axial image 48 of series 504) has decreased in size, currently measuring 1.3 x 1.0 cm. No new hepatic lesions are otherwise noted. No intra or extrahepatic biliary ductal dilatation. Gallbladder is normal in appearance. Pancreas: No pancreatic mass. No pancreatic ductal dilatation. No pancreatic or peripancreatic fluid collections or inflammatory changes. Spleen: Unremarkable. Adrenals/Urinary Tract: Bilateral kidneys are normal in appearance. Right adrenal gland is normal in appearance in the left adrenal gland there is a new 1.8 x 0.9 cm area of thickening (axial image 49 of series 504), suspicious for potential metastatic lesion. No hydroureteronephrosis. Urinary bladder is normal in appearance. Stomach/Bowel: Normal appearance of the stomach. No pathologic  dilatation of small bowel or colon. Normal appendix. Vascular/Lymphatic: Aortic atherosclerosis, without evidence of aneurysm or dissection in the abdominal or pelvic vasculature. No lymphadenopathy noted in the abdomen or pelvis. Reproductive: Status post hysterectomy. Ovaries are not confidently identified may be surgically absent or trophic. Other: Small volume of ascites, new compared to the prior study. No pneumoperitoneum. Musculoskeletal: There are no aggressive appearing lytic or blastic lesions noted in the visualized portions of the skeleton. IMPRESSION: 1. Evolving postradiation changes in the right lung, with significant increased number and size of pulmonary nodules scattered throughout the lungs bilaterally, concerning for widespread metastatic disease. 2. New moderate bilateral pleural effusions and small volume of ascites. 3. Previously noted hepatic lesions demonstrate a mixed response, with interval growth of the  largest lesion and decrease in size of the smaller lesion. 4. New area of nodular thickening in the medial limb of the left adrenal gland suspicious for a new metastatic lesion. Attention on follow-up studies is recommended. 5. Aortic atherosclerosis. 6. Additional incidental findings, as above. Electronically Signed   By: Vinnie Langton M.D.   On: 05/28/2019 10:36   Ct Abdomen Pelvis W Contrast  Result Date: 05/28/2019 CLINICAL DATA:  63 year old female with history of lung cancer. Follow-up study. EXAM: CT CHEST, ABDOMEN, AND PELVIS WITH CONTRAST TECHNIQUE: Multidetector CT imaging of the chest, abdomen and pelvis was performed following the standard protocol during bolus administration of intravenous contrast. CONTRAST:  75m OMNIPAQUE IOHEXOL 300 MG/ML  SOLN COMPARISON:  CT the chest, abdomen and pelvis 03/04/2019. FINDINGS: CT CHEST FINDINGS Cardiovascular: Heart size is normal. There is no significant pericardial fluid, thickening or pericardial calcification. Aortic atherosclerosis. No definite coronary artery calcifications. Left internal jugular single-lumen porta cath with tip terminating in the right atrium. Mediastinum/Nodes: No pathologically enlarged mediastinal or hilar lymph nodes. Esophagus is unremarkable in appearance. No axillary lymphadenopathy. Lungs/Pleura: Compared to the prior examination, the postradiation changes have continue to evolve in the right lung with increasing mass-like areas of architectural distortion most evident in the paramediastinal aspect of the right upper and lower lobes, most compatible with evolving postradiation mass-like fibrosis. New bilateral pleural effusions lying dependently, moderate in size. Increased number and size of numerous pulmonary nodules scattered throughout the lungs bilaterally, largest of which measures 1.4 x 1.3 cm in the right lower lobe (axial image 87 of series 505), which previously measured only 1.0 x 0.8 cm on 03/04/2019. Dependent  areas of atelectasis in the lower lobes of the lungs bilaterally. Musculoskeletal: There are no aggressive appearing lytic or blastic lesions noted in the visualized portions of the skeleton. CT ABDOMEN PELVIS FINDINGS Hepatobiliary: 2 hypovascular hepatic lesions are again noted. The largest of these in segment 7 of the liver has increased in size, currently measuring 4.3 x 2.4 cm (axial image 43 of series 504). The smaller lesion in segment 4B of the liver (axial image 48 of series 504) has decreased in size, currently measuring 1.3 x 1.0 cm. No new hepatic lesions are otherwise noted. No intra or extrahepatic biliary ductal dilatation. Gallbladder is normal in appearance. Pancreas: No pancreatic mass. No pancreatic ductal dilatation. No pancreatic or peripancreatic fluid collections or inflammatory changes. Spleen: Unremarkable. Adrenals/Urinary Tract: Bilateral kidneys are normal in appearance. Right adrenal gland is normal in appearance in the left adrenal gland there is a new 1.8 x 0.9 cm area of thickening (axial image 49 of series 504), suspicious for potential metastatic lesion. No hydroureteronephrosis. Urinary bladder is normal in appearance. Stomach/Bowel: Normal appearance of the  stomach. No pathologic dilatation of small bowel or colon. Normal appendix. Vascular/Lymphatic: Aortic atherosclerosis, without evidence of aneurysm or dissection in the abdominal or pelvic vasculature. No lymphadenopathy noted in the abdomen or pelvis. Reproductive: Status post hysterectomy. Ovaries are not confidently identified may be surgically absent or trophic. Other: Small volume of ascites, new compared to the prior study. No pneumoperitoneum. Musculoskeletal: There are no aggressive appearing lytic or blastic lesions noted in the visualized portions of the skeleton. IMPRESSION: 1. Evolving postradiation changes in the right lung, with significant increased number and size of pulmonary nodules scattered throughout the  lungs bilaterally, concerning for widespread metastatic disease. 2. New moderate bilateral pleural effusions and small volume of ascites. 3. Previously noted hepatic lesions demonstrate a mixed response, with interval growth of the largest lesion and decrease in size of the smaller lesion. 4. New area of nodular thickening in the medial limb of the left adrenal gland suspicious for a new metastatic lesion. Attention on follow-up studies is recommended. 5. Aortic atherosclerosis. 6. Additional incidental findings, as above. Electronically Signed   By: Vinnie Langton M.D.   On: 05/28/2019 10:36    PERFORMANCE STATUS (ECOG) : 1 - Symptomatic but completely ambulatory  Review of Systems As noted above. Otherwise, a complete review of systems is negative.  Physical Exam General: NAD, frail appearing, thin Pulmonary: Unlabored Extremities: no edema, no joint deformities Skin: no rashes Neurological: Weakness but otherwise nonfocal  IMPRESSION: Patient was an add-on to my schedule today due to recent evidence of disease progression.  CT of the chest, abdomen, and pelvis on 05/28/2019 revealed significant increase in number and size of pulmonary nodules bilaterally, new moderate-sized bilateral pleural effusions and small volume ascites, interval growth of largest hepatic lesion, and new nodular thickening of the left adrenal gland suspicious for new metastatic lesion.  Patient saw Dr. Grayland Ormond today and is being rotated to the third line chemotherapy.  It is not felt that patient will likely have any additional treatment options in the event of further disease progression.  Hospice was also discussed but patient says that she wants to pursue further treatment for now.  I attempted to discuss CODE STATUS.  Patient had previously told me she wanted to be a full code.  Now she says she is not sure about decisions and wanted time to think.  I sent her home with a MOST Form, which hopefully she will allow  me to complete with her at time of the next visit.  Case and plan discussed with Dr. Grayland Ormond.    PLAN: -Continue current scope of treatment -Continue oxycodone as needed for pain -MOST form reviewed -RTC in 1 to 2 weeks  Patient expressed understanding and was in agreement with this plan. She also understands that She can call clinic at any time with any questions, concerns, or complaints.    Time Total: 20 minutes  Visit consisted of counseling and education dealing with the complex and emotionally intense issues of symptom management and palliative care in the setting of serious and potentially life-threatening illness.Greater than 50%  of this time was spent counseling and coordinating care related to the above assessment and plan.  Signed by: Altha Harm, PhD, DNP, NP-C, [facility] [phone] (Work Cell)

## 2019-06-01 NOTE — Progress Notes (Signed)
Pt denies any concerns today. Reports no changes since speaking with CMA on phone for pre assessment questions yesterday.

## 2019-06-06 NOTE — Progress Notes (Signed)
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    INTERVAL HISTORY: Patient returns to clinic today for further evaluation and consideration of cycle 1, day 1 of vinorelbine. She continues to have chronic weakness and fatigue and a decreased performance status.  Her peripheral neuropathy is unchanged.  She has no other neurologic complaints.  She has a fair appetite and her weight has remained relatively stable. She denies any pain. She denies any recent fevers or illnesses. She denies any chest pain, shortness of breath, hemoptysis, or cough.  She has no nausea, vomiting, constipation, or diarrhea.  She has no melena or hematochezia.  She has no urinary complaints.  Patient offers no further specific complaints today.  REVIEW OF SYSTEMS:   Review of Systems  Constitutional: Positive for malaise/fatigue. Negative for fever and weight loss.  Respiratory: Negative.  Negative for cough and shortness of breath.   Cardiovascular: Negative.  Negative for chest pain and leg swelling.  Gastrointestinal: Negative.  Negative for abdominal pain, blood in stool and melena.  Genitourinary: Negative.  Negative for dysuria and flank pain.  Musculoskeletal: Negative.  Negative for back pain and joint pain.  Skin: Negative.  Negative for rash.  Neurological: Positive for tingling, sensory change and weakness. Negative for dizziness, focal weakness and headaches.  Psychiatric/Behavioral: Negative.  Negative for depression. The patient is not nervous/anxious.     As per HPI. Otherwise, a complete review of systems is negative.  PAST MEDICAL HISTORY: Past Medical History:    Diagnosis Date   Arthritis    right shoulder   Cancer of right lung ([place]) 08/2017   Chemo + rad tx's.    Hyperlipidemia    Hypertension    Hypothyroidism    Menopausal state    Personal history of chemotherapy 2019   lung ca   Personal history of radiation therapy 2019   lung ca   Thyroid disease     PAST SURGICAL HISTORY: Past Surgical History:  Procedure Laterality Date   ABDOMINAL HYSTERECTOMY  2005   BREAST EXCISIONAL BIOPSY Right 1979   benign   COLONOSCOPY WITH PROPOFOL N/A 05/13/2017   Procedure: COLONOSCOPY WITH PROPOFOL;  Surgeon: [name], MD;  Location: [facility] ENDOSCOPY;  Service: Gastroenterology;  Laterality: N/A;   CYSTECTOMY Right    breast   CYSTECTOMY  02/2015   back of neck   ESOPHAGOGASTRODUODENOSCOPY (EGD) WITH PROPOFOL N/A 05/13/2017   Procedure: ESOPHAGOGASTRODUODENOSCOPY (EGD) WITH PROPOFOL;  Surgeon: [name], MD;  Location: [facility] ENDOSCOPY;  Service: Gastroenterology;  Laterality: N/A;   PORTA CATH INSERTION N/A 09/01/2017   Procedure: PORTA CATH INSERTION;  Surgeon: Algernon Huxley, MD;  Location: [place] CV LAB;  Service: Cardiovascular;  Laterality: N/A;   TUBAL LIGATION  1986    FAMILY HISTORY: Family History  Problem Relation Age of Onset   Hypertension Mother    Stroke Mother    Leukemia Mother    Stroke Father    Pneumonia Father    Diabetes Sister    Hyperlipidemia Sister    Breast cancer Maternal Aunt 26    ADVANCED DIRECTIVES (Y/N):  N  HEALTH MAINTENANCE: Social History   Tobacco Use   Smoking status: Former Smoker  Packs/day: 0.25    Types: Cigarettes    Quit date: 02/01/2017    Years since quitting: 2.3   Smokeless tobacco: Never Used  Substance Use Topics   Alcohol use: No    Alcohol/week: 0.0 standard drinks   Drug use: No     Colonoscopy:  PAP:  Bone density:  Lipid panel:  No Known Allergies  Current Outpatient Medications  Medication Sig Dispense Refill   DULoxetine  (CYMBALTA) 30 MG capsule Take 1 capsule (30 mg total) by mouth daily. 30 capsule 3   lidocaine-prilocaine (EMLA) cream APPLY EXTERNALLY TO THE AFFECTED AREA 1 TIME 30 g 1   Oxycodone HCl 10 MG TABS Take 1 tablet (10 mg total) by mouth every 6 (six) hours as needed. 120 tablet 0   pantoprazole (PROTONIX) 40 MG tablet Take 1 tablet (40 mg total) by mouth daily. 30 tablet 0   potassium chloride 20 MEQ/15ML (10%) SOLN Take 15 mLs (20 mEq total) by mouth 2 (two) times daily. 473 mL 0   levothyroxine (SYNTHROID) 75 MCG tablet Take 1 tablet (75 mcg total) by mouth daily. (Patient not taking: Reported on 05/31/2019) 90 tablet 0   No current facility-administered medications for this visit.    Facility-Administered Medications Ordered in Other Visits  Medication Dose Route Frequency Provider Last Rate Last Dose   sodium chloride flush (NS) 0.9 % injection 10 mL  10 mL Intravenous PRN Lloyd Huger, MD   10 mL at 03/09/19 0830    OBJECTIVE: Vitals:   06/08/19 0944  BP: 98/68  Pulse: 97  Resp: 16  Temp: (!) 96.7 F (35.9 C)  SpO2: 99%     Body mass index is 20.92 kg/m.    ECOG FS:2 - Symptomatic, <50% confined to bed  General: Thin, no acute distress.  Sitting in a wheelchair. Eyes: Pink conjunctiva, anicteric sclera. HEENT: Normocephalic, moist mucous membranes. Lungs: Clear to auscultation bilaterally. Heart: Regular rate and rhythm. No rubs, murmurs, or gallops. Abdomen: Soft, nontender, nondistended. No organomegaly noted, normoactive bowel sounds. Musculoskeletal: No edema, cyanosis, or clubbing. Neuro: Alert, answering all questions appropriately. Cranial nerves grossly intact. Skin: No rashes or petechiae noted. Psych: Normal affect.  LAB RESULTS:  Lab Results  Component Value Date   NA 137 06/08/2019   K 4.0 06/08/2019   CL 101 06/08/2019   CO2 26 06/08/2019   GLUCOSE 158 (H) 06/08/2019   BUN 27 (H) 06/08/2019   CREATININE 1.62 (H) 06/08/2019   CALCIUM 8.9  06/08/2019   PROT 6.0 (L) 06/08/2019   ALBUMIN 2.7 (L) 06/08/2019   AST 40 06/08/2019   ALT 18 06/08/2019   ALKPHOS 72 06/08/2019   BILITOT 0.3 06/08/2019   GFRNONAA 33 (L) 06/08/2019   GFRAA 39 (L) 06/08/2019    Lab Results  Component Value Date   WBC 5.5 06/08/2019   NEUTROABS 4.1 06/08/2019   HGB 10.1 (L) 06/08/2019   HCT 31.8 (L) 06/08/2019   MCV 107.8 (H) 06/08/2019   PLT 238 06/08/2019     STUDIES: Ct Chest W Contrast  Result Date: 05/28/2019 CLINICAL DATA:  63 year old female with history of lung cancer. Follow-up study. EXAM: CT CHEST, ABDOMEN, AND PELVIS WITH CONTRAST TECHNIQUE: Multidetector CT imaging of the chest, abdomen and pelvis was performed following the standard protocol during bolus administration of intravenous contrast. CONTRAST:  13mL OMNIPAQUE IOHEXOL 300 MG/ML  SOLN COMPARISON:  CT the chest, abdomen and pelvis 03/04/2019. FINDINGS: CT CHEST FINDINGS Cardiovascular: Heart size is normal. There is no  significant pericardial fluid, thickening or pericardial calcification. Aortic atherosclerosis. No definite coronary artery calcifications. Left internal jugular single-lumen porta cath with tip terminating in the right atrium. Mediastinum/Nodes: No pathologically enlarged mediastinal or hilar lymph nodes. Esophagus is unremarkable in appearance. No axillary lymphadenopathy. Lungs/Pleura: Compared to the prior examination, the postradiation changes have continue to evolve in the right lung with increasing mass-like areas of architectural distortion most evident in the paramediastinal aspect of the right upper and lower lobes, most compatible with evolving postradiation mass-like fibrosis. New bilateral pleural effusions lying dependently, moderate in size. Increased number and size of numerous pulmonary nodules scattered throughout the lungs bilaterally, largest of which measures 1.4 x 1.3 cm in the right lower lobe (axial image 87 of series 505), which previously  measured only 1.0 x 0.8 cm on 03/04/2019. Dependent areas of atelectasis in the lower lobes of the lungs bilaterally. Musculoskeletal: There are no aggressive appearing lytic or blastic lesions noted in the visualized portions of the skeleton. CT ABDOMEN PELVIS FINDINGS Hepatobiliary: 2 hypovascular hepatic lesions are again noted. The largest of these in segment 7 of the liver has increased in size, currently measuring 4.3 x 2.4 cm (axial image 43 of series 504). The smaller lesion in segment 4B of the liver (axial image 48 of series 504) has decreased in size, currently measuring 1.3 x 1.0 cm. No new hepatic lesions are otherwise noted. No intra or extrahepatic biliary ductal dilatation. Gallbladder is normal in appearance. Pancreas: No pancreatic mass. No pancreatic ductal dilatation. No pancreatic or peripancreatic fluid collections or inflammatory changes. Spleen: Unremarkable. Adrenals/Urinary Tract: Bilateral kidneys are normal in appearance. Right adrenal gland is normal in appearance in the left adrenal gland there is a new 1.8 x 0.9 cm area of thickening (axial image 49 of series 504), suspicious for potential metastatic lesion. No hydroureteronephrosis. Urinary bladder is normal in appearance. Stomach/Bowel: Normal appearance of the stomach. No pathologic dilatation of small bowel or colon. Normal appendix. Vascular/Lymphatic: Aortic atherosclerosis, without evidence of aneurysm or dissection in the abdominal or pelvic vasculature. No lymphadenopathy noted in the abdomen or pelvis. Reproductive: Status post hysterectomy. Ovaries are not confidently identified may be surgically absent or trophic. Other: Small volume of ascites, new compared to the prior study. No pneumoperitoneum. Musculoskeletal: There are no aggressive appearing lytic or blastic lesions noted in the visualized portions of the skeleton. IMPRESSION: 1. Evolving postradiation changes in the right lung, with significant increased number and  size of pulmonary nodules scattered throughout the lungs bilaterally, concerning for widespread metastatic disease. 2. New moderate bilateral pleural effusions and small volume of ascites. 3. Previously noted hepatic lesions demonstrate a mixed response, with interval growth of the largest lesion and decrease in size of the smaller lesion. 4. New area of nodular thickening in the medial limb of the left adrenal gland suspicious for a new metastatic lesion. Attention on follow-up studies is recommended. 5. Aortic atherosclerosis. 6. Additional incidental findings, as above. Electronically Signed   By: Vinnie Langton M.D.   On: 05/28/2019 10:36   Ct Abdomen Pelvis W Contrast  Result Date: 05/28/2019 CLINICAL DATA:  63 year old female with history of lung cancer. Follow-up study. EXAM: CT CHEST, ABDOMEN, AND PELVIS WITH CONTRAST TECHNIQUE: Multidetector CT imaging of the chest, abdomen and pelvis was performed following the standard protocol during bolus administration of intravenous contrast. CONTRAST:  51mL OMNIPAQUE IOHEXOL 300 MG/ML  SOLN COMPARISON:  CT the chest, abdomen and pelvis 03/04/2019. FINDINGS: CT CHEST FINDINGS Cardiovascular: Heart size is normal.  There is no significant pericardial fluid, thickening or pericardial calcification. Aortic atherosclerosis. No definite coronary artery calcifications. Left internal jugular single-lumen porta cath with tip terminating in the right atrium. Mediastinum/Nodes: No pathologically enlarged mediastinal or hilar lymph nodes. Esophagus is unremarkable in appearance. No axillary lymphadenopathy. Lungs/Pleura: Compared to the prior examination, the postradiation changes have continue to evolve in the right lung with increasing mass-like areas of architectural distortion most evident in the paramediastinal aspect of the right upper and lower lobes, most compatible with evolving postradiation mass-like fibrosis. New bilateral pleural effusions lying dependently,  moderate in size. Increased number and size of numerous pulmonary nodules scattered throughout the lungs bilaterally, largest of which measures 1.4 x 1.3 cm in the right lower lobe (axial image 87 of series 505), which previously measured only 1.0 x 0.8 cm on 03/04/2019. Dependent areas of atelectasis in the lower lobes of the lungs bilaterally. Musculoskeletal: There are no aggressive appearing lytic or blastic lesions noted in the visualized portions of the skeleton. CT ABDOMEN PELVIS FINDINGS Hepatobiliary: 2 hypovascular hepatic lesions are again noted. The largest of these in segment 7 of the liver has increased in size, currently measuring 4.3 x 2.4 cm (axial image 43 of series 504). The smaller lesion in segment 4B of the liver (axial image 48 of series 504) has decreased in size, currently measuring 1.3 x 1.0 cm. No new hepatic lesions are otherwise noted. No intra or extrahepatic biliary ductal dilatation. Gallbladder is normal in appearance. Pancreas: No pancreatic mass. No pancreatic ductal dilatation. No pancreatic or peripancreatic fluid collections or inflammatory changes. Spleen: Unremarkable. Adrenals/Urinary Tract: Bilateral kidneys are normal in appearance. Right adrenal gland is normal in appearance in the left adrenal gland there is a new 1.8 x 0.9 cm area of thickening (axial image 49 of series 504), suspicious for potential metastatic lesion. No hydroureteronephrosis. Urinary bladder is normal in appearance. Stomach/Bowel: Normal appearance of the stomach. No pathologic dilatation of small bowel or colon. Normal appendix. Vascular/Lymphatic: Aortic atherosclerosis, without evidence of aneurysm or dissection in the abdominal or pelvic vasculature. No lymphadenopathy noted in the abdomen or pelvis. Reproductive: Status post hysterectomy. Ovaries are not confidently identified may be surgically absent or trophic. Other: Small volume of ascites, new compared to the prior study. No pneumoperitoneum.  Musculoskeletal: There are no aggressive appearing lytic or blastic lesions noted in the visualized portions of the skeleton. IMPRESSION: 1. Evolving postradiation changes in the right lung, with significant increased number and size of pulmonary nodules scattered throughout the lungs bilaterally, concerning for widespread metastatic disease. 2. New moderate bilateral pleural effusions and small volume of ascites. 3. Previously noted hepatic lesions demonstrate a mixed response, with interval growth of the largest lesion and decrease in size of the smaller lesion. 4. New area of nodular thickening in the medial limb of the left adrenal gland suspicious for a new metastatic lesion. Attention on follow-up studies is recommended. 5. Aortic atherosclerosis. 6. Additional incidental findings, as above. Electronically Signed   By: Vinnie Langton M.D.   On: 05/28/2019 10:36   ONCOLOGY TREATMENT HISTORY: Patient initiated treatment with concurrent weekly carboplatin and Taxol along with XRT from September 09, 2017 through October 07, 2017.  She then received maintenance Imfinzi from November 04, 2017 through March 10, 2018 at which point she was noted to have progressive disease.  Treatment was then switched to carboplatinum, pemetrexed, and Avastin which she received between April 01, 2018 and August 25, 2018.  She then received maintenance pemetrexed and  Avastin from September 15, 2018 through November 23, 2018 at which point she was noted to have progressive disease once again.  She was initiated on third line treatment with Taxotere and Cyramza on Dec 15, 2018 this was discontinued on May 11, 2019 secondary to progressive disease.  Fourth line vinorelbine was initiated on June 08, 2019.  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     PLAN:    1. 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  See treatment history above.  CT scan results from May 28, 2019 reviewed progressive disease yet again.  Previously, MRI of her brain on January 09, 2019 did not reveal any metastatic disease.  Previously, we had a lengthy discussion regarding the possibility of transitioning to hospice care but patient wished to try 1 final line of treatment.  Plan is to give vinorelbine on days 1 and 8 with a 15 off.  Proceed with cycle 1, day 1 of treatment today.  Return to clinic in 1 week for further evaluation and consideration of cycle 1, day 8.  Appreciate palliative care input. 2.  Anemia: Hemoglobin continues to trend up and is now 10.1. 3.  Pain: Patient does not complain of pain today.  Continue current narcotic regimen as prescribed. 4.  Left shoulder mobility: Appreciate rehab input. 5.  Hypokalemia: Resolved.  Continue oral supplementation. 6.  Dysphasia: Patient does not complain of this today.  Swallow study and CT scan of the neck are negative.  Continue follow-up with GI.  MRI of the brain was negative as above.  Patient was also empirically given fluconazole for possible candidiasis.  7.  Chronic renal insufficiency: Creatinine slightly worse today at 1.62. Vinorelbine does not need to be dose adjusted for renal insufficiency. 9.  Hypomagnesia: Resolved. 10.  Bony metastasis: Patient last received Zometa on May 11, 2019.  Patient expressed understanding and was in agreement with this plan. She also understands that She can call clinic at any time with any questions, concerns, or complaints.   Cancer Staging Cancer of upper lobe of right lung [facility]) Staging form: Lung, AJCC 8th Edition - Clinical stage from 08/24/2017: Stage IV (cT2a, cN3, cM1c) - Signed by Lloyd Huger, MD on 03/29/2018   Lloyd Huger, MD   06/08/2019 1:55 PM

## 2019-06-07 NOTE — Progress Notes (Signed)
Patient is coming in for follow up she is doing well no complaints  

## 2019-06-08 ENCOUNTER — Inpatient Hospital Stay (HOSPITAL_BASED_OUTPATIENT_CLINIC_OR_DEPARTMENT_OTHER): Payer: Self-pay | Admitting: Oncology

## 2019-06-08 ENCOUNTER — Inpatient Hospital Stay (HOSPITAL_BASED_OUTPATIENT_CLINIC_OR_DEPARTMENT_OTHER): Payer: Self-pay | Admitting: Hospice and Palliative Medicine

## 2019-06-08 ENCOUNTER — Other Ambulatory Visit: Payer: Self-pay

## 2019-06-08 ENCOUNTER — Inpatient Hospital Stay: Payer: Self-pay | Attending: Oncology

## 2019-06-08 ENCOUNTER — Inpatient Hospital Stay: Payer: Self-pay

## 2019-06-08 VITALS — BP 98/68 | HR 97 | Temp 96.7°F | Resp 16 | Wt 107.1 lb

## 2019-06-08 DIAGNOSIS — Z7189 Other specified counseling: Secondary | ICD-10-CM

## 2019-06-08 DIAGNOSIS — M1990 Unspecified osteoarthritis, unspecified site: Secondary | ICD-10-CM | POA: Insufficient documentation

## 2019-06-08 DIAGNOSIS — E039 Hypothyroidism, unspecified: Secondary | ICD-10-CM | POA: Insufficient documentation

## 2019-06-08 DIAGNOSIS — Z66 Do not resuscitate: Secondary | ICD-10-CM | POA: Insufficient documentation

## 2019-06-08 DIAGNOSIS — C787 Secondary malignant neoplasm of liver and intrahepatic bile duct: Secondary | ICD-10-CM | POA: Insufficient documentation

## 2019-06-08 DIAGNOSIS — N189 Chronic kidney disease, unspecified: Secondary | ICD-10-CM | POA: Insufficient documentation

## 2019-06-08 DIAGNOSIS — Z515 Encounter for palliative care: Secondary | ICD-10-CM | POA: Insufficient documentation

## 2019-06-08 DIAGNOSIS — R531 Weakness: Secondary | ICD-10-CM | POA: Insufficient documentation

## 2019-06-08 DIAGNOSIS — D649 Anemia, unspecified: Secondary | ICD-10-CM | POA: Insufficient documentation

## 2019-06-08 DIAGNOSIS — G629 Polyneuropathy, unspecified: Secondary | ICD-10-CM | POA: Insufficient documentation

## 2019-06-08 DIAGNOSIS — T451X5A Adverse effect of antineoplastic and immunosuppressive drugs, initial encounter: Secondary | ICD-10-CM | POA: Insufficient documentation

## 2019-06-08 DIAGNOSIS — C3411 Malignant neoplasm of upper lobe, right bronchus or lung: Secondary | ICD-10-CM | POA: Insufficient documentation

## 2019-06-08 DIAGNOSIS — I129 Hypertensive chronic kidney disease with stage 1 through stage 4 chronic kidney disease, or unspecified chronic kidney disease: Secondary | ICD-10-CM | POA: Insufficient documentation

## 2019-06-08 DIAGNOSIS — Z79899 Other long term (current) drug therapy: Secondary | ICD-10-CM | POA: Insufficient documentation

## 2019-06-08 DIAGNOSIS — R197 Diarrhea, unspecified: Secondary | ICD-10-CM | POA: Insufficient documentation

## 2019-06-08 DIAGNOSIS — C7951 Secondary malignant neoplasm of bone: Secondary | ICD-10-CM | POA: Insufficient documentation

## 2019-06-08 DIAGNOSIS — D701 Agranulocytosis secondary to cancer chemotherapy: Secondary | ICD-10-CM | POA: Insufficient documentation

## 2019-06-08 DIAGNOSIS — Z923 Personal history of irradiation: Secondary | ICD-10-CM | POA: Insufficient documentation

## 2019-06-08 DIAGNOSIS — Z5111 Encounter for antineoplastic chemotherapy: Secondary | ICD-10-CM | POA: Insufficient documentation

## 2019-06-08 DIAGNOSIS — Z87891 Personal history of nicotine dependence: Secondary | ICD-10-CM | POA: Insufficient documentation

## 2019-06-08 DIAGNOSIS — R5382 Chronic fatigue, unspecified: Secondary | ICD-10-CM | POA: Insufficient documentation

## 2019-06-08 DIAGNOSIS — E785 Hyperlipidemia, unspecified: Secondary | ICD-10-CM | POA: Insufficient documentation

## 2019-06-08 LAB — CBC WITH DIFFERENTIAL/PLATELET
Abs Immature Granulocytes: 0.01 10*3/uL (ref 0.00–0.07)
Basophils Absolute: 0.1 10*3/uL (ref 0.0–0.1)
Basophils Relative: 1 %
Eosinophils Absolute: 0.2 10*3/uL (ref 0.0–0.5)
Eosinophils Relative: 3 %
HCT: 31.8 % — ABNORMAL LOW (ref 36.0–46.0)
Hemoglobin: 10.1 g/dL — ABNORMAL LOW (ref 12.0–15.0)
Immature Granulocytes: 0 %
Lymphocytes Relative: 15 %
Lymphs Abs: 0.9 10*3/uL (ref 0.7–4.0)
MCH: 34.2 pg — ABNORMAL HIGH (ref 26.0–34.0)
MCHC: 31.8 g/dL (ref 30.0–36.0)
MCV: 107.8 fL — ABNORMAL HIGH (ref 80.0–100.0)
Monocytes Absolute: 0.3 10*3/uL (ref 0.1–1.0)
Monocytes Relative: 6 %
Neutro Abs: 4.1 10*3/uL (ref 1.7–7.7)
Neutrophils Relative %: 75 %
Platelets: 238 10*3/uL (ref 150–400)
RBC: 2.95 MIL/uL — ABNORMAL LOW (ref 3.87–5.11)
RDW: 17.1 % — ABNORMAL HIGH (ref 11.5–15.5)
WBC: 5.5 10*3/uL (ref 4.0–10.5)
nRBC: 0 % (ref 0.0–0.2)

## 2019-06-08 LAB — COMPREHENSIVE METABOLIC PANEL
ALT: 18 U/L (ref 0–44)
AST: 40 U/L (ref 15–41)
Albumin: 2.7 g/dL — ABNORMAL LOW (ref 3.5–5.0)
Alkaline Phosphatase: 72 U/L (ref 38–126)
Anion gap: 10 (ref 5–15)
BUN: 27 mg/dL — ABNORMAL HIGH (ref 8–23)
CO2: 26 mmol/L (ref 22–32)
Calcium: 8.9 mg/dL (ref 8.9–10.3)
Chloride: 101 mmol/L (ref 98–111)
Creatinine, Ser: 1.62 mg/dL — ABNORMAL HIGH (ref 0.44–1.00)
GFR calc Af Amer: 39 mL/min — ABNORMAL LOW (ref >60)
GFR calc non Af Amer: 33 mL/min — ABNORMAL LOW (ref >60)
Glucose, Bld: 158 mg/dL — ABNORMAL HIGH (ref 70–99)
Potassium: 4 mmol/L (ref 3.5–5.1)
Sodium: 137 mmol/L (ref 135–145)
Total Bilirubin: 0.3 mg/dL (ref 0.3–1.2)
Total Protein: 6 g/dL — ABNORMAL LOW (ref 6.5–8.1)

## 2019-06-08 LAB — MAGNESIUM: Magnesium: 1.9 mg/dL (ref 1.7–2.4)

## 2019-06-08 MED ORDER — PROCHLORPERAZINE MALEATE 10 MG PO TABS
10.0000 mg | ORAL_TABLET | Freq: Once | ORAL | Status: AC
  Administered 2019-06-08: 10 mg via ORAL
  Filled 2019-06-08: qty 1

## 2019-06-08 MED ORDER — HEPARIN SOD (PORK) LOCK FLUSH 100 UNIT/ML IV SOLN: 500.0000 [IU] | Freq: Once | INTRAVENOUS | Status: DC | PRN

## 2019-06-08 MED ORDER — VINORELBINE TARTRATE CHEMO INJECTION 50 MG/5ML
25.0000 mg/m2 | Freq: Once | INTRAVENOUS | Status: AC
  Administered 2019-06-08: 36 mg via INTRAVENOUS
  Filled 2019-06-08: qty 3.6

## 2019-06-08 MED ORDER — SODIUM CHLORIDE 0.9% FLUSH
10.0000 mL | INTRAVENOUS | Status: DC | PRN
  Administered 2019-06-08: 10:00:00 10 mL via INTRAVENOUS
  Filled 2019-06-08: qty 10

## 2019-06-08 MED ORDER — SODIUM CHLORIDE 0.9 % IV SOLN
Freq: Once | INTRAVENOUS | Status: AC
  Administered 2019-06-08: 11:00:00 via INTRAVENOUS
  Filled 2019-06-08: qty 250

## 2019-06-08 MED ORDER — HEPARIN SOD (PORK) LOCK FLUSH 100 UNIT/ML IV SOLN
500.0000 [IU] | Freq: Once | INTRAVENOUS | Status: AC
  Administered 2019-06-08: 500 [IU] via INTRAVENOUS
  Filled 2019-06-08: qty 5

## 2019-06-08 NOTE — Progress Notes (Signed)
[place]  [phone] [phone]   Name: Janet [name] Date: 06/08/2019 MRN: [id number]  DOB: Jul 13, 1956  Patient Care Team: Volney American, PA-C as PCP - General (Family Medicine) [name], RN as Registered Nurse    REASON FOR CONSULTATION: Palliative Care consult requested for this 63 y.o. 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  Palliative care was asked to help support patient through treatment and address goals of care.   SOCIAL HISTORY:    Patient is married but separated.  Patient lives with her sister.  She has a son and daughter.  Patient used to work on an [employer], television/film set.  ADVANCE DIRECTIVES:  Does not have  CODE STATUS: Full code  PAST MEDICAL HISTORY: Past Medical History:  Diagnosis Date   Arthritis    right shoulder   Cancer of right lung ([place]) 08/2017   Chemo + rad tx's.    Hyperlipidemia    Hypertension    Hypothyroidism    Menopausal state    Personal history of chemotherapy 2019   lung ca   Personal history of radiation therapy 2019   lung ca   Thyroid disease     PAST SURGICAL HISTORY:  Past Surgical History:  Procedure Laterality Date   ABDOMINAL HYSTERECTOMY  2005   BREAST EXCISIONAL BIOPSY Right 1979   benign   COLONOSCOPY WITH PROPOFOL N/A 05/13/2017   Procedure: COLONOSCOPY WITH PROPOFOL;  Surgeon: [name], MD;  Location: [facility] ENDOSCOPY;  Service: Gastroenterology;  Laterality: N/A;   CYSTECTOMY Right    breast   CYSTECTOMY  02/2015   back of neck   ESOPHAGOGASTRODUODENOSCOPY (EGD) WITH PROPOFOL N/A 05/13/2017   Procedure: ESOPHAGOGASTRODUODENOSCOPY (EGD) WITH PROPOFOL;  Surgeon: [name], MD;  Location: [facility] ENDOSCOPY;  Service: Gastroenterology;  Laterality: N/A;   PORTA  CATH INSERTION N/A 09/01/2017   Procedure: PORTA CATH INSERTION;  Surgeon: Algernon Huxley, MD;  Location: [place] CV LAB;  Service: Cardiovascular;  Laterality: N/A;   TUBAL LIGATION  1986    HEMATOLOGY/ONCOLOGY HISTORY:  Oncology History  Cancer of upper lobe of right lung ([place])  07/09/2017 Initial Diagnosis   Cancer of upper lobe of right lung ([place])   04/01/2018 - 12/07/2018 Chemotherapy   The patient had palonosetron (ALOXI) injection 0.25 mg, 0.25 mg, Intravenous,  Once, 12 of 18 cycles Administration: 0.25 mg (04/01/2018), 0.25 mg (04/22/2018), 0.25 mg (05/13/2018), 0.25 mg (06/03/2018), 0.25 mg (06/23/2018), 0.25 mg (07/14/2018), 0.25 mg (08/04/2018), 0.25 mg (08/25/2018), 0.25 mg (09/15/2018), 0.25 mg (10/06/2018), 0.25 mg (10/27/2018), 0.25 mg (11/17/2018) bevacizumab (AVASTIN) 900 mg in sodium chloride 0.9 % 100 mL chemo infusion, 925 mg, Intravenous,  Once, 12 of 18 cycles Administration: 900 mg (04/01/2018), 900 mg (04/22/2018), 800 mg (06/03/2018), 800 mg (06/23/2018), 800 mg (07/14/2018), 800 mg (08/04/2018), 800 mg (08/25/2018), 800 mg (09/15/2018), 800 mg (10/06/2018), 800 mg (10/27/2018), 800 mg (11/17/2018) PEMEtrexed (ALIMTA) 800 mg in sodium chloride 0.9 % 100 mL chemo infusion, 800 mg, Intravenous,  Once, 12 of 18 cycles Administration: 800 mg (04/01/2018), 800 mg (04/22/2018), 800 mg (05/13/2018), 800 mg (06/03/2018), 800 mg (06/23/2018), 800 mg (07/14/2018), 800 mg (08/04/2018), 800 mg (08/25/2018), 800 mg (09/15/2018), 800 mg (10/06/2018), 800 mg (10/27/2018), 800 mg (11/17/2018) CARBOplatin (PARAPLATIN) 480 mg in sodium chloride 0.9 % 250 mL chemo infusion, 480 mg (100 %  of original dose 480.5 mg), Intravenous,  Once, 8 of 8 cycles Dose modification:   (original dose 480.5 mg, Cycle 1) Administration: 480 mg (04/01/2018), 480 mg (04/22/2018), 480 mg (05/13/2018), 400 mg (06/03/2018), 440 mg (06/23/2018), 440 mg (07/14/2018), 440 mg (08/04/2018), 430 mg (08/25/2018) fosaprepitant (EMEND) 150 mg,  dexamethasone (DECADRON) 12 mg in sodium chloride 0.9 % 145 mL IVPB, , Intravenous,  Once, 12 of 18 cycles Administration:  (04/01/2018),  (04/22/2018),  (05/13/2018),  (06/03/2018),  (06/23/2018),  (07/14/2018),  (08/04/2018),  (08/25/2018),  (09/15/2018),  (10/06/2018),  (10/27/2018),  (11/17/2018)  for chemotherapy treatment.    12/15/2018 - 05/31/2019 Chemotherapy   The patient had pegfilgrastim (NEULASTA ONPRO KIT) injection 6 mg, 6 mg, Subcutaneous, Once, 8 of 12 cycles Administration: 6 mg (12/15/2018), 6 mg (01/05/2019), 6 mg (01/26/2019), 6 mg (02/16/2019), 6 mg (03/09/2019), 6 mg (03/30/2019), 6 mg (04/20/2019), 6 mg (05/11/2019) DOCEtaxel (TAXOTERE) 110 mg in sodium chloride 0.9 % 250 mL chemo infusion, 75 mg/m2 = 110 mg, Intravenous,  Once, 8 of 12 cycles Administration: 110 mg (12/15/2018), 110 mg (01/05/2019), 110 mg (01/26/2019), 110 mg (02/16/2019), 110 mg (03/09/2019), 110 mg (03/30/2019), 110 mg (04/20/2019), 110 mg (05/11/2019) ramucirumab (CYRAMZA) 500 mg in sodium chloride 0.9 % 200 mL chemo infusion, 10 mg/kg = 500 mg, Intravenous, Once, 8 of 12 cycles Administration: 500 mg (12/15/2018), 500 mg (01/05/2019), 500 mg (01/26/2019), 430 mg (02/16/2019), 500 mg (03/09/2019), 430 mg (03/30/2019), 430 mg (04/20/2019), 430 mg (05/11/2019)  for chemotherapy treatment.    06/08/2019 -  Chemotherapy   The patient had vinorelbine (NAVELBINE) 36 mg in sodium chloride 0.9 % 50 mL chemo infusion, 25 mg/m2 = 36 mg, Intravenous,  Once, 1 of 4 cycles Administration: 36 mg (06/08/2019)  for chemotherapy treatment.      ALLERGIES:  has No Known Allergies.  MEDICATIONS:  Current Outpatient Medications  Medication Sig Dispense Refill   DULoxetine (CYMBALTA) 30 MG capsule Take 1 capsule (30 mg total) by mouth daily. 30 capsule 3   levothyroxine (SYNTHROID) 75 MCG tablet Take 1 tablet (75 mcg total) by mouth daily. (Patient not taking: Reported on 05/31/2019) 90 tablet 0   lidocaine-prilocaine (EMLA) cream APPLY EXTERNALLY TO  THE AFFECTED AREA 1 TIME 30 g 1   Oxycodone HCl 10 MG TABS Take 1 tablet (10 mg total) by mouth every 6 (six) hours as needed. 120 tablet 0   pantoprazole (PROTONIX) 40 MG tablet Take 1 tablet (40 mg total) by mouth daily. 30 tablet 0   potassium chloride 20 MEQ/15ML (10%) SOLN Take 15 mLs (20 mEq total) by mouth 2 (two) times daily. 473 mL 0   No current facility-administered medications for this visit.    Facility-Administered Medications Ordered in Other Visits  Medication Dose Route Frequency Provider Last Rate Last Dose   sodium chloride flush (NS) 0.9 % injection 10 mL  10 mL Intravenous PRN Lloyd Huger, MD   10 mL at 03/09/19 0830    VITAL SIGNS: LMP 08/08/2003 (Approximate) Comment: Hysterectomy 2004 There were no vitals filed for this visit.  Estimated body mass index is 20.92 kg/m as calculated from the following:   Height as of 04/15/19: 5' (1.524 m).   Weight as of an earlier encounter on 06/08/19: 107 lb 1.6 oz (48.6 kg).  LABS: CBC:    Component Value Date/Time   WBC 5.5 06/08/2019 0936   HGB 10.1 (L) 06/08/2019 0936   HGB 10.8 (L) 07/09/2017 1615   HCT 31.8 (L) 06/08/2019 0569  HCT 32.7 (L) 07/09/2017 1615   PLT 238 06/08/2019 0936   PLT 394 (H) 07/09/2017 1615   MCV 107.8 (H) 06/08/2019 0936   MCV 92 07/09/2017 1615   NEUTROABS 4.1 06/08/2019 0936   NEUTROABS 2.5 07/09/2017 1615   LYMPHSABS 0.9 06/08/2019 0936   LYMPHSABS 3.1 07/09/2017 1615   MONOABS 0.3 06/08/2019 0936   EOSABS 0.2 06/08/2019 0936   EOSABS 0.1 07/09/2017 1615   BASOSABS 0.1 06/08/2019 0936   BASOSABS 0.0 07/09/2017 1615   Comprehensive Metabolic Panel:    Component Value Date/Time   NA 137 06/08/2019 0936   NA 141 04/15/2019 0903   K 4.0 06/08/2019 0936   CL 101 06/08/2019 0936   CO2 26 06/08/2019 0936   BUN 27 (H) 06/08/2019 0936   BUN 16 04/15/2019 0903   CREATININE 1.62 (H) 06/08/2019 0936   GLUCOSE 158 (H) 06/08/2019 0936   CALCIUM 8.9 06/08/2019 0936   AST 40  06/08/2019 0936   ALT 18 06/08/2019 0936   ALKPHOS 72 06/08/2019 0936   BILITOT 0.3 06/08/2019 0936   BILITOT 0.3 04/15/2019 0903   PROT 6.0 (L) 06/08/2019 0936   PROT 6.1 04/15/2019 0903   ALBUMIN 2.7 (L) 06/08/2019 0936   ALBUMIN 3.3 (L) 04/15/2019 0903    RADIOGRAPHIC STUDIES: Ct Chest W Contrast  Result Date: 05/28/2019 CLINICAL DATA:  63 year old female with history of lung cancer. Follow-up study. EXAM: CT CHEST, ABDOMEN, AND PELVIS WITH CONTRAST TECHNIQUE: Multidetector CT imaging of the chest, abdomen and pelvis was performed following the standard protocol during bolus administration of intravenous contrast. CONTRAST:  33m OMNIPAQUE IOHEXOL 300 MG/ML  SOLN COMPARISON:  CT the chest, abdomen and pelvis 03/04/2019. FINDINGS: CT CHEST FINDINGS Cardiovascular: Heart size is normal. There is no significant pericardial fluid, thickening or pericardial calcification. Aortic atherosclerosis. No definite coronary artery calcifications. Left internal jugular single-lumen porta cath with tip terminating in the right atrium. Mediastinum/Nodes: No pathologically enlarged mediastinal or hilar lymph nodes. Esophagus is unremarkable in appearance. No axillary lymphadenopathy. Lungs/Pleura: Compared to the prior examination, the postradiation changes have continue to evolve in the right lung with increasing mass-like areas of architectural distortion most evident in the paramediastinal aspect of the right upper and lower lobes, most compatible with evolving postradiation mass-like fibrosis. New bilateral pleural effusions lying dependently, moderate in size. Increased number and size of numerous pulmonary nodules scattered throughout the lungs bilaterally, largest of which measures 1.4 x 1.3 cm in the right lower lobe (axial image 87 of series 505), which previously measured only 1.0 x 0.8 cm on 03/04/2019. Dependent areas of atelectasis in the lower lobes of the lungs bilaterally. Musculoskeletal: There are  no aggressive appearing lytic or blastic lesions noted in the visualized portions of the skeleton. CT ABDOMEN PELVIS FINDINGS Hepatobiliary: 2 hypovascular hepatic lesions are again noted. The largest of these in segment 7 of the liver has increased in size, currently measuring 4.3 x 2.4 cm (axial image 43 of series 504). The smaller lesion in segment 4B of the liver (axial image 48 of series 504) has decreased in size, currently measuring 1.3 x 1.0 cm. No new hepatic lesions are otherwise noted. No intra or extrahepatic biliary ductal dilatation. Gallbladder is normal in appearance. Pancreas: No pancreatic mass. No pancreatic ductal dilatation. No pancreatic or peripancreatic fluid collections or inflammatory changes. Spleen: Unremarkable. Adrenals/Urinary Tract: Bilateral kidneys are normal in appearance. Right adrenal gland is normal in appearance in the left adrenal gland there is a new 1.8 x 0.9  cm area of thickening (axial image 49 of series 504), suspicious for potential metastatic lesion. No hydroureteronephrosis. Urinary bladder is normal in appearance. Stomach/Bowel: Normal appearance of the stomach. No pathologic dilatation of small bowel or colon. Normal appendix. Vascular/Lymphatic: Aortic atherosclerosis, without evidence of aneurysm or dissection in the abdominal or pelvic vasculature. No lymphadenopathy noted in the abdomen or pelvis. Reproductive: Status post hysterectomy. Ovaries are not confidently identified may be surgically absent or trophic. Other: Small volume of ascites, new compared to the prior study. No pneumoperitoneum. Musculoskeletal: There are no aggressive appearing lytic or blastic lesions noted in the visualized portions of the skeleton. IMPRESSION: 1. Evolving postradiation changes in the right lung, with significant increased number and size of pulmonary nodules scattered throughout the lungs bilaterally, concerning for widespread metastatic disease. 2. New moderate bilateral  pleural effusions and small volume of ascites. 3. Previously noted hepatic lesions demonstrate a mixed response, with interval growth of the largest lesion and decrease in size of the smaller lesion. 4. New area of nodular thickening in the medial limb of the left adrenal gland suspicious for a new metastatic lesion. Attention on follow-up studies is recommended. 5. Aortic atherosclerosis. 6. Additional incidental findings, as above. Electronically Signed   By: Vinnie Langton M.D.   On: 05/28/2019 10:36   Ct Abdomen Pelvis W Contrast  Result Date: 05/28/2019 CLINICAL DATA:  63 year old female with history of lung cancer. Follow-up study. EXAM: CT CHEST, ABDOMEN, AND PELVIS WITH CONTRAST TECHNIQUE: Multidetector CT imaging of the chest, abdomen and pelvis was performed following the standard protocol during bolus administration of intravenous contrast. CONTRAST:  48m OMNIPAQUE IOHEXOL 300 MG/ML  SOLN COMPARISON:  CT the chest, abdomen and pelvis 03/04/2019. FINDINGS: CT CHEST FINDINGS Cardiovascular: Heart size is normal. There is no significant pericardial fluid, thickening or pericardial calcification. Aortic atherosclerosis. No definite coronary artery calcifications. Left internal jugular single-lumen porta cath with tip terminating in the right atrium. Mediastinum/Nodes: No pathologically enlarged mediastinal or hilar lymph nodes. Esophagus is unremarkable in appearance. No axillary lymphadenopathy. Lungs/Pleura: Compared to the prior examination, the postradiation changes have continue to evolve in the right lung with increasing mass-like areas of architectural distortion most evident in the paramediastinal aspect of the right upper and lower lobes, most compatible with evolving postradiation mass-like fibrosis. New bilateral pleural effusions lying dependently, moderate in size. Increased number and size of numerous pulmonary nodules scattered throughout the lungs bilaterally, largest of which measures  1.4 x 1.3 cm in the right lower lobe (axial image 87 of series 505), which previously measured only 1.0 x 0.8 cm on 03/04/2019. Dependent areas of atelectasis in the lower lobes of the lungs bilaterally. Musculoskeletal: There are no aggressive appearing lytic or blastic lesions noted in the visualized portions of the skeleton. CT ABDOMEN PELVIS FINDINGS Hepatobiliary: 2 hypovascular hepatic lesions are again noted. The largest of these in segment 7 of the liver has increased in size, currently measuring 4.3 x 2.4 cm (axial image 43 of series 504). The smaller lesion in segment 4B of the liver (axial image 48 of series 504) has decreased in size, currently measuring 1.3 x 1.0 cm. No new hepatic lesions are otherwise noted. No intra or extrahepatic biliary ductal dilatation. Gallbladder is normal in appearance. Pancreas: No pancreatic mass. No pancreatic ductal dilatation. No pancreatic or peripancreatic fluid collections or inflammatory changes. Spleen: Unremarkable. Adrenals/Urinary Tract: Bilateral kidneys are normal in appearance. Right adrenal gland is normal in appearance in the left adrenal gland there is a new  1.8 x 0.9 cm area of thickening (axial image 49 of series 504), suspicious for potential metastatic lesion. No hydroureteronephrosis. Urinary bladder is normal in appearance. Stomach/Bowel: Normal appearance of the stomach. No pathologic dilatation of small bowel or colon. Normal appendix. Vascular/Lymphatic: Aortic atherosclerosis, without evidence of aneurysm or dissection in the abdominal or pelvic vasculature. No lymphadenopathy noted in the abdomen or pelvis. Reproductive: Status post hysterectomy. Ovaries are not confidently identified may be surgically absent or trophic. Other: Small volume of ascites, new compared to the prior study. No pneumoperitoneum. Musculoskeletal: There are no aggressive appearing lytic or blastic lesions noted in the visualized portions of the skeleton. IMPRESSION: 1.  Evolving postradiation changes in the right lung, with significant increased number and size of pulmonary nodules scattered throughout the lungs bilaterally, concerning for widespread metastatic disease. 2. New moderate bilateral pleural effusions and small volume of ascites. 3. Previously noted hepatic lesions demonstrate a mixed response, with interval growth of the largest lesion and decrease in size of the smaller lesion. 4. New area of nodular thickening in the medial limb of the left adrenal gland suspicious for a new metastatic lesion. Attention on follow-up studies is recommended. 5. Aortic atherosclerosis. 6. Additional incidental findings, as above. Electronically Signed   By: Vinnie Langton M.D.   On: 05/28/2019 10:36    PERFORMANCE STATUS (ECOG) : 1 - Symptomatic but completely ambulatory  Review of Systems As noted above. Otherwise, a complete review of systems is negative.  Physical Exam General: NAD, frail appearing, thin Pulmonary: Unlabored Extremities: no edema, no joint deformities Skin: no rashes Neurological: Weakness but otherwise nonfocal  IMPRESSION: Patient feels she is doing about as well as can be expected.  She denies any acute changes or concerns today.  No distressing symptoms were reported.  Patient is being rotated to third line chemotherapy starting today.  She recognizes that there are no other treatment options available in the event of disease progression.  Hospice has been discussed.  I again discussed CODE STATUS and attempted to complete a MOST Form but patient remains undecided about end-of-life decisions.  Case and plan discussed with Dr. Grayland Ormond.    PLAN: -Continue current scope of treatment -Continue oxycodone as needed for pain -MOST form reviewed -RTC in 2 weeks  Patient expressed understanding and was in agreement with this plan. She also understands that She can call clinic at any time with any questions, concerns, or complaints.     Time Total: 15 minutes  Visit consisted of counseling and education dealing with the complex and emotionally intense issues of symptom management and palliative care in the setting of serious and potentially life-threatening illness.Greater than 50%  of this time was spent counseling and coordinating care related to the above assessment and plan.  Signed by: Altha Harm, PhD, DNP, NP-C, [facility] [phone] (Work Cell)

## 2019-06-11 ENCOUNTER — Encounter: Payer: Self-pay | Admitting: Pharmacy Technician

## 2019-06-11 NOTE — Progress Notes (Signed)
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    INTERVAL HISTORY: Patient returns to clinic today for further evaluation and consideration of cycle 1, day 8 of vinorelbine.  She had some increased diarrhea, but tolerated her treatment relatively well.  She continues to have chronic weakness and fatigue and decreased performance status.  Her peripheral neuropathy is unchanged.  She has no other neurologic complaints.  She has a fair appetite and her weight has remained relatively stable. She denies any pain. She denies any recent fevers or illnesses. She denies any chest pain, shortness of breath, hemoptysis, or cough.  She has no nausea, vomiting, or constipation.  She has no melena or hematochezia.  She has no urinary complaints.  Patient offers no further specific complaints today.  REVIEW OF SYSTEMS:   Review of Systems  Constitutional: Positive for malaise/fatigue. Negative for fever and weight loss.  Respiratory: Negative.  Negative for cough and shortness of breath.   Cardiovascular: Negative.  Negative for chest pain and leg swelling.  Gastrointestinal: Positive for diarrhea. Negative for abdominal pain, blood in stool and melena.  Genitourinary: Negative.  Negative for dysuria and flank pain.  Musculoskeletal: Negative.  Negative for back pain and joint pain.  Skin: Negative.  Negative for rash.  Neurological: Positive for tingling, sensory change and weakness. Negative for dizziness, focal weakness and headaches.  Psychiatric/Behavioral: Negative.  Negative for depression. The patient is not nervous/anxious.     As per HPI. Otherwise, a complete  review of systems is negative.  PAST MEDICAL HISTORY: Past Medical History:  Diagnosis Date   Arthritis    right shoulder   Cancer of right lung ([place]) 08/2017   Chemo + rad tx's.    Hyperlipidemia    Hypertension    Hypothyroidism    Menopausal state    Personal history of chemotherapy 2019   lung ca   Personal history of radiation therapy 2019   lung ca   Thyroid disease     PAST SURGICAL HISTORY: Past Surgical History:  Procedure Laterality Date   ABDOMINAL HYSTERECTOMY  2005   BREAST EXCISIONAL BIOPSY Right 1979   benign   COLONOSCOPY WITH PROPOFOL N/A 05/13/2017   Procedure: COLONOSCOPY WITH PROPOFOL;  Surgeon: [name], MD;  Location: [facility] ENDOSCOPY;  Service: Gastroenterology;  Laterality: N/A;   CYSTECTOMY Right    breast   CYSTECTOMY  02/2015   back of neck   ESOPHAGOGASTRODUODENOSCOPY (EGD) WITH PROPOFOL N/A 05/13/2017   Procedure: ESOPHAGOGASTRODUODENOSCOPY (EGD) WITH PROPOFOL;  Surgeon: [name], MD;  Location: [facility] ENDOSCOPY;  Service: Gastroenterology;  Laterality: N/A;   PORTA CATH INSERTION N/A 09/01/2017   Procedure: PORTA CATH INSERTION;  Surgeon: Algernon Huxley, MD;  Location: [place] CV LAB;  Service: Cardiovascular;  Laterality: N/A;   TUBAL LIGATION  1986    FAMILY HISTORY: Family History  Problem Relation Age of Onset   Hypertension Mother    Stroke Mother    Leukemia Mother    Stroke Father    Pneumonia Father    Diabetes Sister    Hyperlipidemia Sister    Breast cancer Maternal Aunt 54    ADVANCED DIRECTIVES (Y/N):  N  HEALTH MAINTENANCE: Social History  Tobacco Use   Smoking status: Former Smoker    Packs/day: 0.25    Types: Cigarettes    Quit date: 02/01/2017    Years since quitting: 2.3   Smokeless tobacco: Never Used  Substance Use Topics   Alcohol use: No    Alcohol/week: 0.0 standard drinks   Drug use: No     Colonoscopy:  PAP:  Bone density:  Lipid panel:  No Known  Allergies  Current Outpatient Medications  Medication Sig Dispense Refill   lidocaine-prilocaine (EMLA) cream APPLY EXTERNALLY TO THE AFFECTED AREA 1 TIME 30 g 1   Oxycodone HCl 10 MG TABS Take 1 tablet (10 mg total) by mouth every 6 (six) hours as needed. 120 tablet 0   pantoprazole (PROTONIX) 40 MG tablet Take 1 tablet (40 mg total) by mouth daily. 30 tablet 0   potassium chloride 20 MEQ/15ML (10%) SOLN Take 15 mLs (20 mEq total) by mouth 2 (two) times daily. 473 mL 0   DULoxetine (CYMBALTA) 30 MG capsule Take 1 capsule (30 mg total) by mouth daily. (Patient not taking: Reported on 06/14/2019) 30 capsule 3   levothyroxine (SYNTHROID) 75 MCG tablet Take 1 tablet (75 mcg total) by mouth daily. (Patient not taking: Reported on 05/31/2019) 90 tablet 0   No current facility-administered medications for this visit.    Facility-Administered Medications Ordered in Other Visits  Medication Dose Route Frequency Provider Last Rate Last Dose   sodium chloride flush (NS) 0.9 % injection 10 mL  10 mL Intravenous PRN Lloyd Huger, MD   10 mL at 03/09/19 0830    OBJECTIVE: Vitals:   06/14/19 1355 06/15/19 1037  BP: 104/66 104/66  Pulse: 98 98  Resp: 16 16  Temp: 98.3 F (36.8 C) 98.3 F (36.8 C)  SpO2: 99% 100%     Body mass index is 20.7 kg/m.    ECOG FS:2 - Symptomatic, <50% confined to bed  General: Thin, no acute distress.  Sitting in a wheelchair. Eyes: Pink conjunctiva, anicteric sclera. HEENT: Normocephalic, moist mucous membranes. Lungs: Clear to auscultation bilaterally. Heart: Regular rate and rhythm. No rubs, murmurs, or gallops. Abdomen: Soft, nontender, nondistended. No organomegaly noted, normoactive bowel sounds. Musculoskeletal: No edema, cyanosis, or clubbing. Neuro: Alert, answering all questions appropriately. Cranial nerves grossly intact. Skin: No rashes or petechiae noted. Psych: Normal affect.  LAB RESULTS:  Lab Results  Component Value Date   NA  138 06/15/2019   K 4.1 06/15/2019   CL 104 06/15/2019   CO2 28 06/15/2019   GLUCOSE 114 (H) 06/15/2019   BUN 31 (H) 06/15/2019   CREATININE 1.58 (H) 06/15/2019   CALCIUM 8.6 (L) 06/15/2019   PROT 6.2 (L) 06/15/2019   ALBUMIN 2.6 (L) 06/15/2019   AST 42 (H) 06/15/2019   ALT 18 06/15/2019   ALKPHOS 63 06/15/2019   BILITOT 0.3 06/15/2019   GFRNONAA 34 (L) 06/15/2019   GFRAA 40 (L) 06/15/2019    Lab Results  Component Value Date   WBC 2.6 (L) 06/15/2019   NEUTROABS 1.7 06/15/2019   HGB 9.4 (L) 06/15/2019   HCT 29.7 (L) 06/15/2019   MCV 106.8 (H) 06/15/2019   PLT 152 06/15/2019     STUDIES: Ct Chest W Contrast  Result Date: 05/28/2019 CLINICAL DATA:  63 year old female with history of lung cancer. Follow-up study. EXAM: CT CHEST, ABDOMEN, AND PELVIS WITH CONTRAST TECHNIQUE: Multidetector CT imaging of the chest, abdomen and pelvis was performed following the standard protocol during bolus administration of intravenous contrast. CONTRAST:  24mL OMNIPAQUE IOHEXOL 300 MG/ML  SOLN COMPARISON:  CT the chest, abdomen and pelvis 03/04/2019. FINDINGS: CT CHEST FINDINGS Cardiovascular: Heart size is normal. There is no significant pericardial fluid, thickening or pericardial calcification. Aortic atherosclerosis. No definite coronary artery calcifications. Left internal jugular single-lumen porta cath with tip terminating in the right atrium. Mediastinum/Nodes: No pathologically enlarged mediastinal or hilar lymph nodes. Esophagus is unremarkable in appearance. No axillary lymphadenopathy. Lungs/Pleura: Compared to the prior examination, the postradiation changes have continue to evolve in the right lung with increasing mass-like areas of architectural distortion most evident in the paramediastinal aspect of the right upper and lower lobes, most compatible with evolving postradiation mass-like fibrosis. New bilateral pleural effusions lying dependently, moderate in size. Increased number and size  of numerous pulmonary nodules scattered throughout the lungs bilaterally, largest of which measures 1.4 x 1.3 cm in the right lower lobe (axial image 87 of series 505), which previously measured only 1.0 x 0.8 cm on 03/04/2019. Dependent areas of atelectasis in the lower lobes of the lungs bilaterally. Musculoskeletal: There are no aggressive appearing lytic or blastic lesions noted in the visualized portions of the skeleton. CT ABDOMEN PELVIS FINDINGS Hepatobiliary: 2 hypovascular hepatic lesions are again noted. The largest of these in segment 7 of the liver has increased in size, currently measuring 4.3 x 2.4 cm (axial image 43 of series 504). The smaller lesion in segment 4B of the liver (axial image 48 of series 504) has decreased in size, currently measuring 1.3 x 1.0 cm. No new hepatic lesions are otherwise noted. No intra or extrahepatic biliary ductal dilatation. Gallbladder is normal in appearance. Pancreas: No pancreatic mass. No pancreatic ductal dilatation. No pancreatic or peripancreatic fluid collections or inflammatory changes. Spleen: Unremarkable. Adrenals/Urinary Tract: Bilateral kidneys are normal in appearance. Right adrenal gland is normal in appearance in the left adrenal gland there is a new 1.8 x 0.9 cm area of thickening (axial image 49 of series 504), suspicious for potential metastatic lesion. No hydroureteronephrosis. Urinary bladder is normal in appearance. Stomach/Bowel: Normal appearance of the stomach. No pathologic dilatation of small bowel or colon. Normal appendix. Vascular/Lymphatic: Aortic atherosclerosis, without evidence of aneurysm or dissection in the abdominal or pelvic vasculature. No lymphadenopathy noted in the abdomen or pelvis. Reproductive: Status post hysterectomy. Ovaries are not confidently identified may be surgically absent or trophic. Other: Small volume of ascites, new compared to the prior study. No pneumoperitoneum. Musculoskeletal: There are no aggressive  appearing lytic or blastic lesions noted in the visualized portions of the skeleton. IMPRESSION: 1. Evolving postradiation changes in the right lung, with significant increased number and size of pulmonary nodules scattered throughout the lungs bilaterally, concerning for widespread metastatic disease. 2. New moderate bilateral pleural effusions and small volume of ascites. 3. Previously noted hepatic lesions demonstrate a mixed response, with interval growth of the largest lesion and decrease in size of the smaller lesion. 4. New area of nodular thickening in the medial limb of the left adrenal gland suspicious for a new metastatic lesion. Attention on follow-up studies is recommended. 5. Aortic atherosclerosis. 6. Additional incidental findings, as above. Electronically Signed   By: Vinnie Langton M.D.   On: 05/28/2019 10:36   Ct Abdomen Pelvis W Contrast  Result Date: 05/28/2019 CLINICAL DATA:  63 year old female with history of lung cancer. Follow-up study. EXAM: CT CHEST, ABDOMEN, AND PELVIS WITH CONTRAST TECHNIQUE: Multidetector CT imaging of the chest, abdomen and pelvis was performed following the standard protocol during bolus administration of intravenous  contrast. CONTRAST:  45mL OMNIPAQUE IOHEXOL 300 MG/ML  SOLN COMPARISON:  CT the chest, abdomen and pelvis 03/04/2019. FINDINGS: CT CHEST FINDINGS Cardiovascular: Heart size is normal. There is no significant pericardial fluid, thickening or pericardial calcification. Aortic atherosclerosis. No definite coronary artery calcifications. Left internal jugular single-lumen porta cath with tip terminating in the right atrium. Mediastinum/Nodes: No pathologically enlarged mediastinal or hilar lymph nodes. Esophagus is unremarkable in appearance. No axillary lymphadenopathy. Lungs/Pleura: Compared to the prior examination, the postradiation changes have continue to evolve in the right lung with increasing mass-like areas of architectural distortion most  evident in the paramediastinal aspect of the right upper and lower lobes, most compatible with evolving postradiation mass-like fibrosis. New bilateral pleural effusions lying dependently, moderate in size. Increased number and size of numerous pulmonary nodules scattered throughout the lungs bilaterally, largest of which measures 1.4 x 1.3 cm in the right lower lobe (axial image 87 of series 505), which previously measured only 1.0 x 0.8 cm on 03/04/2019. Dependent areas of atelectasis in the lower lobes of the lungs bilaterally. Musculoskeletal: There are no aggressive appearing lytic or blastic lesions noted in the visualized portions of the skeleton. CT ABDOMEN PELVIS FINDINGS Hepatobiliary: 2 hypovascular hepatic lesions are again noted. The largest of these in segment 7 of the liver has increased in size, currently measuring 4.3 x 2.4 cm (axial image 43 of series 504). The smaller lesion in segment 4B of the liver (axial image 48 of series 504) has decreased in size, currently measuring 1.3 x 1.0 cm. No new hepatic lesions are otherwise noted. No intra or extrahepatic biliary ductal dilatation. Gallbladder is normal in appearance. Pancreas: No pancreatic mass. No pancreatic ductal dilatation. No pancreatic or peripancreatic fluid collections or inflammatory changes. Spleen: Unremarkable. Adrenals/Urinary Tract: Bilateral kidneys are normal in appearance. Right adrenal gland is normal in appearance in the left adrenal gland there is a new 1.8 x 0.9 cm area of thickening (axial image 49 of series 504), suspicious for potential metastatic lesion. No hydroureteronephrosis. Urinary bladder is normal in appearance. Stomach/Bowel: Normal appearance of the stomach. No pathologic dilatation of small bowel or colon. Normal appendix. Vascular/Lymphatic: Aortic atherosclerosis, without evidence of aneurysm or dissection in the abdominal or pelvic vasculature. No lymphadenopathy noted in the abdomen or pelvis.  Reproductive: Status post hysterectomy. Ovaries are not confidently identified may be surgically absent or trophic. Other: Small volume of ascites, new compared to the prior study. No pneumoperitoneum. Musculoskeletal: There are no aggressive appearing lytic or blastic lesions noted in the visualized portions of the skeleton. IMPRESSION: 1. Evolving postradiation changes in the right lung, with significant increased number and size of pulmonary nodules scattered throughout the lungs bilaterally, concerning for widespread metastatic disease. 2. New moderate bilateral pleural effusions and small volume of ascites. 3. Previously noted hepatic lesions demonstrate a mixed response, with interval growth of the largest lesion and decrease in size of the smaller lesion. 4. New area of nodular thickening in the medial limb of the left adrenal gland suspicious for a new metastatic lesion. Attention on follow-up studies is recommended. 5. Aortic atherosclerosis. 6. Additional incidental findings, as above. Electronically Signed   By: Vinnie Langton M.D.   On: 05/28/2019 10:36   ONCOLOGY TREATMENT HISTORY: Patient initiated treatment with concurrent weekly carboplatin and Taxol along with XRT from September 09, 2017 through October 07, 2017.  She then received maintenance Imfinzi from November 04, 2017 through March 10, 2018 at which point she was noted to have progressive disease.  Treatment was then switched to carboplatinum, pemetrexed, and Avastin which she received between April 01, 2018 and August 25, 2018.  She then received maintenance pemetrexed and Avastin from September 15, 2018 through November 23, 2018 at which point she was noted to have progressive disease once again.  She was initiated on third line treatment with Taxotere and Cyramza on Dec 15, 2018 this was discontinued on May 11, 2019 secondary to progressive disease.  Fourth line vinorelbine was initiated on June 08, 2019.  ASSESSMENT: Progressive stage IV  adenocarcinoma of the lung with metastatic disease in bones and liver.     PLAN:    1. 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  See treatment history above.  CT scan results from May 28, 2019 reviewed progressive disease yet again.  Previously, MRI of her brain on January 09, 2019 did not reveal any metastatic disease.  Previously, we had a lengthy discussion regarding the possibility of transitioning to hospice care but patient wished to try 1 final line of treatment.  Plan is to give vinorelbine on days 1 and 8 with a 15 off.  Proceed with cycle 1, day 8 of treatment today.  Return to clinic in 2 weeks for further evaluation and consideration of cycle 2, day 1.  Appreciate palliative care input. 2.  Anemia: Hemoglobin has trended down and is now 9.4. 3.  Pain: Patient does not complain of pain today.  Continue current narcotic regimen as prescribed. 4.  Left shoulder mobility: Appreciate rehab input. 5.  Hypokalemia: Resolved.  Continue oral supplementation. 6.  Dysphasia: Patient does not complain of this today.  Swallow study and CT scan of the neck are negative.  Continue follow-up with GI.  MRI of the brain was negative as above.  7.  Chronic renal insufficiency: Chronic and unchanged.  Patient's creatinine is 1.58 today.  Vinorelbine does not need to be dose adjusted for renal insufficiency. 9.  Hypomagnesia: Resolved. 10.  Bony metastasis: Patient last received Zometa on May 11, 2019. 11.  Leukopenia: Secondary to chemotherapy.  Monitor and proceed with treatment as above.  Patient expressed understanding and was in agreement with this plan. She also understands that She can call clinic at any time with any questions, concerns, or complaints.   Cancer Staging Cancer of upper lobe of right lung [facility]) Staging form: Lung, AJCC 8th Edition - Clinical stage from  08/24/2017: Stage IV (cT2a, cN3, cM1c) - Signed by Lloyd Huger, MD on 03/29/2018   Lloyd Huger, MD   06/15/2019 1:28 PM

## 2019-06-11 NOTE — Progress Notes (Signed)
Patient no longer getting  Neulasta from [place] or [place] form [employer] based on new regiment. Last DOS covered is 05/11/2019.

## 2019-06-14 ENCOUNTER — Other Ambulatory Visit: Payer: Self-pay

## 2019-06-14 NOTE — Progress Notes (Signed)
Patient prescreened for appointment. Patient has no concerns or questions.  

## 2019-06-15 ENCOUNTER — Inpatient Hospital Stay: Payer: Self-pay

## 2019-06-15 ENCOUNTER — Inpatient Hospital Stay (HOSPITAL_BASED_OUTPATIENT_CLINIC_OR_DEPARTMENT_OTHER): Payer: Self-pay | Admitting: Oncology

## 2019-06-15 ENCOUNTER — Other Ambulatory Visit: Payer: Self-pay

## 2019-06-15 VITALS — BP 104/66 | HR 98 | Temp 98.3°F | Resp 16 | Wt 106.0 lb

## 2019-06-15 DIAGNOSIS — C3411 Malignant neoplasm of upper lobe, right bronchus or lung: Secondary | ICD-10-CM

## 2019-06-15 DIAGNOSIS — C3490 Malignant neoplasm of unspecified part of unspecified bronchus or lung: Secondary | ICD-10-CM

## 2019-06-15 DIAGNOSIS — Z95828 Presence of other vascular implants and grafts: Secondary | ICD-10-CM

## 2019-06-15 LAB — MAGNESIUM: Magnesium: 2 mg/dL (ref 1.7–2.4)

## 2019-06-15 LAB — CBC WITH DIFFERENTIAL/PLATELET
Abs Immature Granulocytes: 0.03 10*3/uL (ref 0.00–0.07)
Basophils Absolute: 0 10*3/uL (ref 0.0–0.1)
Basophils Relative: 1 %
Eosinophils Absolute: 0.1 10*3/uL (ref 0.0–0.5)
Eosinophils Relative: 2 %
HCT: 29.7 % — ABNORMAL LOW (ref 36.0–46.0)
Hemoglobin: 9.4 g/dL — ABNORMAL LOW (ref 12.0–15.0)
Immature Granulocytes: 1 %
Lymphocytes Relative: 29 %
Lymphs Abs: 0.7 10*3/uL (ref 0.7–4.0)
MCH: 33.8 pg (ref 26.0–34.0)
MCHC: 31.6 g/dL (ref 30.0–36.0)
MCV: 106.8 fL — ABNORMAL HIGH (ref 80.0–100.0)
Monocytes Absolute: 0.1 10*3/uL (ref 0.1–1.0)
Monocytes Relative: 3 %
Neutro Abs: 1.7 10*3/uL (ref 1.7–7.7)
Neutrophils Relative %: 64 %
Platelets: 152 10*3/uL (ref 150–400)
RBC: 2.78 MIL/uL — ABNORMAL LOW (ref 3.87–5.11)
RDW: 16.6 % — ABNORMAL HIGH (ref 11.5–15.5)
Smear Review: NORMAL
WBC: 2.6 10*3/uL — ABNORMAL LOW (ref 4.0–10.5)
nRBC: 0 % (ref 0.0–0.2)

## 2019-06-15 LAB — COMPREHENSIVE METABOLIC PANEL
ALT: 18 U/L (ref 0–44)
AST: 42 U/L — ABNORMAL HIGH (ref 15–41)
Albumin: 2.6 g/dL — ABNORMAL LOW (ref 3.5–5.0)
Alkaline Phosphatase: 63 U/L (ref 38–126)
Anion gap: 6 (ref 5–15)
BUN: 31 mg/dL — ABNORMAL HIGH (ref 8–23)
CO2: 28 mmol/L (ref 22–32)
Calcium: 8.6 mg/dL — ABNORMAL LOW (ref 8.9–10.3)
Chloride: 104 mmol/L (ref 98–111)
Creatinine, Ser: 1.58 mg/dL — ABNORMAL HIGH (ref 0.44–1.00)
GFR calc Af Amer: 40 mL/min — ABNORMAL LOW (ref >60)
GFR calc non Af Amer: 34 mL/min — ABNORMAL LOW (ref >60)
Glucose, Bld: 114 mg/dL — ABNORMAL HIGH (ref 70–99)
Potassium: 4.1 mmol/L (ref 3.5–5.1)
Sodium: 138 mmol/L (ref 135–145)
Total Bilirubin: 0.3 mg/dL (ref 0.3–1.2)
Total Protein: 6.2 g/dL — ABNORMAL LOW (ref 6.5–8.1)

## 2019-06-15 MED ORDER — HEPARIN SOD (PORK) LOCK FLUSH 100 UNIT/ML IV SOLN
500.0000 [IU] | Freq: Once | INTRAVENOUS | Status: AC | PRN
  Administered 2019-06-15: 500 [IU]
  Filled 2019-06-15: qty 5

## 2019-06-15 MED ORDER — SODIUM CHLORIDE 0.9% FLUSH
10.0000 mL | Freq: Once | INTRAVENOUS | Status: AC
  Administered 2019-06-15: 10 mL via INTRAVENOUS
  Filled 2019-06-15: qty 10

## 2019-06-15 MED ORDER — PROCHLORPERAZINE MALEATE 10 MG PO TABS
10.0000 mg | ORAL_TABLET | Freq: Once | ORAL | Status: AC
  Administered 2019-06-15: 10 mg via ORAL
  Filled 2019-06-15: qty 1

## 2019-06-15 MED ORDER — SODIUM CHLORIDE 0.9 % IV SOLN
Freq: Once | INTRAVENOUS | Status: AC
  Administered 2019-06-15: 11:00:00 via INTRAVENOUS
  Filled 2019-06-15: qty 250

## 2019-06-15 MED ORDER — VINORELBINE TARTRATE CHEMO INJECTION 50 MG/5ML
25.0000 mg/m2 | Freq: Once | INTRAVENOUS | Status: AC
  Administered 2019-06-15: 36 mg via INTRAVENOUS
  Filled 2019-06-15: qty 3.6

## 2019-06-15 NOTE — Progress Notes (Signed)
Blood return noted before and after Vinorelbine infusion.

## 2019-06-21 ENCOUNTER — Other Ambulatory Visit: Payer: Self-pay

## 2019-06-22 ENCOUNTER — Inpatient Hospital Stay (HOSPITAL_BASED_OUTPATIENT_CLINIC_OR_DEPARTMENT_OTHER): Payer: Self-pay | Admitting: Hospice and Palliative Medicine

## 2019-06-22 DIAGNOSIS — Z515 Encounter for palliative care: Secondary | ICD-10-CM

## 2019-06-22 DIAGNOSIS — Z7189 Other specified counseling: Secondary | ICD-10-CM

## 2019-06-22 DIAGNOSIS — C3411 Malignant neoplasm of upper lobe, right bronchus or lung: Secondary | ICD-10-CM

## 2019-06-22 DIAGNOSIS — Z710 Person encountering health services to consult on behalf of another person: Secondary | ICD-10-CM

## 2019-06-22 NOTE — Progress Notes (Signed)
Virtual Visit via Telephone Note  I connected with Janet [name] on 06/22/19 at 11:00 AM EST by telephone and verified that I am speaking with the correct person using two identifiers.   I discussed the limitations, risks, security and privacy concerns of performing an evaluation and management service by telephone and the availability of in person appointments. I also discussed with the patient that there may be a patient responsible charge related to this service. The patient expressed understanding and agreed to proceed.   History of Present Illness: Palliative Care consult requested for this 63 y.o. 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  Patient has had recent evidence of disease progression. Palliative care was asked to help support patient through treatment and address goals of care.   Observations/Objective: I tried calling patient but did not reach her.  I spoke instead with patient's son.  He reports that patient has been declining over the last several weeks.  He recognizes that she has progressive cancer and that she is likely nearing end-of-life.  He mentioned the idea of hospice involvement, which we discussed in detail.  Patient was rotated to last line treatment and he recognizes that she cannot have hospice while simultaneously receiving chemotherapy.  Patient has follow-up visit with medical oncology next week to further discuss options.  Son says that he and his sister plan to meet with patient this evening to discuss end-of-life decision-making.  He is hopeful that she will allow him to complete advanced directives and power of attorney documents.  Patient should have in the home ACP documents and the MOST Form.  Son plans to attend the next clinic visit.  Assessment and Plan: Stage IV non-small cell lung cancer -on  last line treatment.  Follow-up medical oncology visit next week.  We will add a palliative care visit to that day.  We will plan to further discuss goals of care.  Patient would be appropriate for hospice if she is willing.  Follow Up Instructions: RTC next week   I discussed the assessment and treatment plan with the patient. The patient was provided an opportunity to ask questions and all were answered. The patient agreed with the plan and demonstrated an understanding of the instructions.   The patient was advised to call back or seek an in-person evaluation if the symptoms worsen or if the condition fails to improve as anticipated.  I provided 15 minutes of non-face-to-face time during this encounter.   Irean Hong, NP

## 2019-06-24 ENCOUNTER — Telehealth: Payer: Self-pay | Admitting: *Deleted

## 2019-06-24 NOTE — Telephone Encounter (Signed)
Yes, that is fine. 

## 2019-06-24 NOTE — Telephone Encounter (Signed)
Please send an order to [facility]

## 2019-06-24 NOTE — Telephone Encounter (Signed)
Called and left a message letting patients son that it has already been ordered.

## 2019-06-24 NOTE — Telephone Encounter (Signed)
Looks like Dr. Grayland Ormond has already authorized hospice order  Copied from [place] [phone]. Topic: Referral - Request for Referral >> Jun 24, 2019  2:05 PM Scherrie Gerlach wrote: Son Janet [name] calling to find out how he can get his mother enrolled in home hospice care.

## 2019-06-24 NOTE — Telephone Encounter (Signed)
[name] called asking for referral to [facility] for hospice services. Please return his call [phone] or [phone]

## 2019-06-25 ENCOUNTER — Telehealth: Payer: Self-pay | Admitting: *Deleted

## 2019-06-25 ENCOUNTER — Encounter: Payer: Self-pay | Admitting: Family Medicine

## 2019-06-25 ENCOUNTER — Encounter: Payer: Self-pay | Admitting: Oncology

## 2019-06-25 ENCOUNTER — Other Ambulatory Visit: Payer: Self-pay

## 2019-06-25 DIAGNOSIS — C3492 Malignant neoplasm of unspecified part of left bronchus or lung: Secondary | ICD-10-CM

## 2019-06-25 NOTE — Telephone Encounter (Signed)
Please see Previous note, I saw the other office told him this was done and it was not. I called [facility] this morning and I am waiting to do Referral for this. I have to call and do referral

## 2019-06-25 NOTE — Telephone Encounter (Signed)
Son called and states that he called [facility] and they told hm that they did not receive a referral form [place] for hospice. He requests that we send an order by fax to [phone], he spoke with Otila Kluver

## 2019-06-25 NOTE — Telephone Encounter (Signed)
Called and left message to get referral for Hospice

## 2019-06-25 NOTE — Telephone Encounter (Signed)
I called and spoke with a referral intake worker who told me that they got the referral today and will call to arrange a visit. I asked that this be done ASAP

## 2019-06-25 NOTE — Telephone Encounter (Signed)
Call received from [facility] [phone] wanting to discuss a referral for this patient. I spoke with [place] and she states she left a message early this mornig to make the referral. She will try calling Marlowe Kays again

## 2019-06-25 NOTE — Telephone Encounter (Signed)
Referral placed.

## 2019-06-28 ENCOUNTER — Other Ambulatory Visit: Payer: Self-pay

## 2019-06-28 ENCOUNTER — Encounter: Payer: Self-pay | Admitting: *Deleted

## 2019-06-28 NOTE — Progress Notes (Signed)
Patient pre screened for office appointment, no questions or concerns today. Patient reminded of upcoming appointment time and date. 

## 2019-06-29 ENCOUNTER — Other Ambulatory Visit: Payer: Self-pay

## 2019-06-29 ENCOUNTER — Inpatient Hospital Stay (HOSPITAL_BASED_OUTPATIENT_CLINIC_OR_DEPARTMENT_OTHER): Payer: Self-pay | Admitting: Oncology

## 2019-06-29 ENCOUNTER — Inpatient Hospital Stay: Payer: Self-pay | Admitting: Oncology

## 2019-06-29 ENCOUNTER — Inpatient Hospital Stay: Payer: Self-pay | Admitting: Hospice and Palliative Medicine

## 2019-06-29 ENCOUNTER — Encounter: Payer: Self-pay | Admitting: Oncology

## 2019-06-29 ENCOUNTER — Inpatient Hospital Stay: Payer: Self-pay

## 2019-06-29 ENCOUNTER — Inpatient Hospital Stay (HOSPITAL_BASED_OUTPATIENT_CLINIC_OR_DEPARTMENT_OTHER): Payer: Self-pay | Admitting: Hospice and Palliative Medicine

## 2019-06-29 VITALS — BP 111/74 | HR 97 | Temp 97.5°F | Resp 15 | Wt 104.4 lb

## 2019-06-29 DIAGNOSIS — Z515 Encounter for palliative care: Secondary | ICD-10-CM

## 2019-06-29 DIAGNOSIS — C3492 Malignant neoplasm of unspecified part of left bronchus or lung: Secondary | ICD-10-CM

## 2019-06-29 DIAGNOSIS — Z7189 Other specified counseling: Secondary | ICD-10-CM

## 2019-06-29 NOTE — Progress Notes (Signed)
[place]  Telephone:([phone] [phone]   Name: Janet [name] Date: 06/29/2019 MRN: [id number]  DOB: 12/09/1955  Patient Care Team: Volney American, PA-C as PCP - General (Family Medicine) [name], RN as Registered Nurse    REASON FOR CONSULTATION: Palliative Care consult requested for this 63 y.o. 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  Palliative care was asked to help support patient through treatment and address goals of care.   SOCIAL HISTORY:    Patient is married but separated.  Patient lives with her sister.  She has a son and daughter.  Patient used to work on an Designer, television/film set.  ADVANCE DIRECTIVES:  Does not have  CODE STATUS: DNR/DNI (DNR form completed on 06/29/2019)  PAST MEDICAL HISTORY: Past Medical History:  Diagnosis Date  . Arthritis    right shoulder  . Cancer of right lung ([place]) 08/2017   Chemo + rad tx's.   Marland Kitchen Hyperlipidemia   . Hypertension   . Hypothyroidism   . Menopausal state   . Personal history of chemotherapy 2019   lung ca  . Personal history of radiation therapy 2019   lung ca  . Thyroid disease     PAST SURGICAL HISTORY:  Past Surgical History:  Procedure Laterality Date  . ABDOMINAL HYSTERECTOMY  2005  . BREAST EXCISIONAL BIOPSY Right 1979   benign  . COLONOSCOPY WITH PROPOFOL N/A 05/13/2017   Procedure: COLONOSCOPY WITH PROPOFOL;  Surgeon: [name], MD;  Location[facility] ENDOSCOPY;  Service: Gastroenterology;  Laterality: N/A;  . CYSTECTOMY Right    breast  . CYSTECTOMY  02/2015   back of neck  . ESOPHAGOGASTRODUODENOSCOPY (EGD) WITH PROPOFOL N/A 05/13/2017   Procedure: ESOPHAGOGASTRODUODENOSCOPY (EGD) WITH PROPOFOL;  Surgeon: [name], MD;  Location: [facility] ENDOSCOPY;  Service:  Gastroenterology;  Laterality: N/A;  . PORTA CATH INSERTION N/A 09/01/2017   Procedure: PORTA CATH INSERTION;  Surgeon: Algernon Huxley, MD;  Location: [place] CV LAB;  Service: Cardiovascular;  Laterality: N/A;  . TUBAL LIGATION  1986    HEMATOLOGY/ONCOLOGY HISTORY:  Oncology History  Cancer of upper lobe of right lung ([place])  07/09/2017 Initial Diagnosis   Cancer of upper lobe of right lung ([place])   04/01/2018 - 12/07/2018 Chemotherapy   The patient had palonosetron (ALOXI) injection 0.25 mg, 0.25 mg, Intravenous,  Once, 12 of 18 cycles Administration: 0.25 mg (04/01/2018), 0.25 mg (04/22/2018), 0.25 mg (05/13/2018), 0.25 mg (06/03/2018), 0.25 mg (06/23/2018), 0.25 mg (07/14/2018), 0.25 mg (08/04/2018), 0.25 mg (08/25/2018), 0.25 mg (09/15/2018), 0.25 mg (10/06/2018), 0.25 mg (10/27/2018), 0.25 mg (11/17/2018) bevacizumab (AVASTIN) 900 mg in sodium chloride 0.9 % 100 mL chemo infusion, 925 mg, Intravenous,  Once, 12 of 18 cycles Administration: 900 mg (04/01/2018), 900 mg (04/22/2018), 800 mg (06/03/2018), 800 mg (06/23/2018), 800 mg (07/14/2018), 800 mg (08/04/2018), 800 mg (08/25/2018), 800 mg (09/15/2018), 800 mg (10/06/2018), 800 mg (10/27/2018), 800 mg (11/17/2018) PEMEtrexed (ALIMTA) 800 mg in sodium chloride 0.9 % 100 mL chemo infusion, 800 mg, Intravenous,  Once, 12 of 18 cycles Administration: 800 mg (04/01/2018), 800 mg (04/22/2018), 800 mg (05/13/2018), 800 mg (06/03/2018), 800 mg (06/23/2018), 800 mg (07/14/2018), 800 mg (08/04/2018), 800 mg (08/25/2018), 800 mg (09/15/2018), 800 mg (10/06/2018), 800 mg (10/27/2018), 800 mg (11/17/2018) CARBOplatin (PARAPLATIN) 480 mg in sodium chloride 0.9 % 250 mL chemo infusion,  480 mg (100 % of original dose 480.5 mg), Intravenous,  Once, 8 of 8 cycles Dose modification:   (original dose 480.5 mg, Cycle 1) Administration: 480 mg (04/01/2018), 480 mg (04/22/2018), 480 mg (05/13/2018), 400 mg (06/03/2018), 440 mg (06/23/2018), 440 mg (07/14/2018), 440 mg (08/04/2018), 430 mg  (08/25/2018) fosaprepitant (EMEND) 150 mg, dexamethasone (DECADRON) 12 mg in sodium chloride 0.9 % 145 mL IVPB, , Intravenous,  Once, 12 of 18 cycles Administration:  (04/01/2018),  (04/22/2018),  (05/13/2018),  (06/03/2018),  (06/23/2018),  (07/14/2018),  (08/04/2018),  (08/25/2018),  (09/15/2018),  (10/06/2018),  (10/27/2018),  (11/17/2018)  for chemotherapy treatment.    12/15/2018 - 05/31/2019 Chemotherapy   The patient had pegfilgrastim (NEULASTA ONPRO KIT) injection 6 mg, 6 mg, Subcutaneous, Once, 8 of 12 cycles Administration: 6 mg (12/15/2018), 6 mg (01/05/2019), 6 mg (01/26/2019), 6 mg (02/16/2019), 6 mg (03/09/2019), 6 mg (03/30/2019), 6 mg (04/20/2019), 6 mg (05/11/2019) DOCEtaxel (TAXOTERE) 110 mg in sodium chloride 0.9 % 250 mL chemo infusion, 75 mg/m2 = 110 mg, Intravenous,  Once, 8 of 12 cycles Administration: 110 mg (12/15/2018), 110 mg (01/05/2019), 110 mg (01/26/2019), 110 mg (02/16/2019), 110 mg (03/09/2019), 110 mg (03/30/2019), 110 mg (04/20/2019), 110 mg (05/11/2019) ramucirumab (CYRAMZA) 500 mg in sodium chloride 0.9 % 200 mL chemo infusion, 10 mg/kg = 500 mg, Intravenous, Once, 8 of 12 cycles Administration: 500 mg (12/15/2018), 500 mg (01/05/2019), 500 mg (01/26/2019), 430 mg (02/16/2019), 500 mg (03/09/2019), 430 mg (03/30/2019), 430 mg (04/20/2019), 430 mg (05/11/2019)  for chemotherapy treatment.    06/08/2019 -  Chemotherapy   The patient had vinorelbine (NAVELBINE) 36 mg in sodium chloride 0.9 % 50 mL chemo infusion, 25 mg/m2 = 36 mg, Intravenous,  Once, 1 of 4 cycles Administration: 36 mg (06/08/2019), 36 mg (06/15/2019)  for chemotherapy treatment.      ALLERGIES:  has No Known Allergies.  MEDICATIONS:  Current Outpatient Medications  Medication Sig Dispense Refill  . DULoxetine (CYMBALTA) 30 MG capsule Take 1 capsule (30 mg total) by mouth daily. 30 capsule 3  . levothyroxine (SYNTHROID) 75 MCG tablet Take 1 tablet (75 mcg total) by mouth daily. 90 tablet 0  . lidocaine-prilocaine (EMLA) cream  APPLY EXTERNALLY TO THE AFFECTED AREA 1 TIME 30 g 1  . Oxycodone HCl 10 MG TABS Take 1 tablet (10 mg total) by mouth every 6 (six) hours as needed. 120 tablet 0  . pantoprazole (PROTONIX) 40 MG tablet Take 1 tablet (40 mg total) by mouth daily. 30 tablet 0  . potassium chloride 20 MEQ/15ML (10%) SOLN Take 15 mLs (20 mEq total) by mouth 2 (two) times daily. 473 mL 0   No current facility-administered medications for this visit.    Facility-Administered Medications Ordered in Other Visits  Medication Dose Route Frequency Provider Last Rate Last Dose  . sodium chloride flush (NS) 0.9 % injection 10 mL  10 mL Intravenous PRN Lloyd Huger, MD   10 mL at 03/09/19 0830    VITAL SIGNS: LMP 08/08/2003 (Approximate) Comment: Hysterectomy 2004 There were no vitals filed for this visit.  Estimated body mass index is 20.39 kg/m as calculated from the following:   Height as of 04/15/19: 5' (1.524 m).   Weight as of an earlier encounter on 06/29/19: 104 lb 6.4 oz (47.4 kg).  LABS: CBC:    Component Value Date/Time   WBC 2.6 (L) 06/15/2019 1012   HGB 9.4 (L) 06/15/2019 1012   HGB 10.8 (L) 07/09/2017 1615   HCT 29.7 (L) 06/15/2019 1012  HCT 32.7 (L) 07/09/2017 1615   PLT 152 06/15/2019 1012   PLT 394 (H) 07/09/2017 1615   MCV 106.8 (H) 06/15/2019 1012   MCV 92 07/09/2017 1615   NEUTROABS 1.7 06/15/2019 1012   NEUTROABS 2.5 07/09/2017 1615   LYMPHSABS 0.7 06/15/2019 1012   LYMPHSABS 3.1 07/09/2017 1615   MONOABS 0.1 06/15/2019 1012   EOSABS 0.1 06/15/2019 1012   EOSABS 0.1 07/09/2017 1615   BASOSABS 0.0 06/15/2019 1012   BASOSABS 0.0 07/09/2017 1615   Comprehensive Metabolic Panel:    Component Value Date/Time   NA 138 06/15/2019 1012   NA 141 04/15/2019 0903   K 4.1 06/15/2019 1012   CL 104 06/15/2019 1012   CO2 28 06/15/2019 1012   BUN 31 (H) 06/15/2019 1012   BUN 16 04/15/2019 0903   CREATININE 1.58 (H) 06/15/2019 1012   GLUCOSE 114 (H) 06/15/2019 1012   CALCIUM 8.6 (L)  06/15/2019 1012   AST 42 (H) 06/15/2019 1012   ALT 18 06/15/2019 1012   ALKPHOS 63 06/15/2019 1012   BILITOT 0.3 06/15/2019 1012   BILITOT 0.3 04/15/2019 0903   PROT 6.2 (L) 06/15/2019 1012   PROT 6.1 04/15/2019 0903   ALBUMIN 2.6 (L) 06/15/2019 1012   ALBUMIN 3.3 (L) 04/15/2019 0903    RADIOGRAPHIC STUDIES: No results found.  PERFORMANCE STATUS (ECOG) : 1 - Symptomatic but completely ambulatory  Review of Systems As noted above. Otherwise, a complete review of systems is negative.  Physical Exam General: NAD, frail appearing, thin Pulmonary: Unlabored Extremities: no edema, no joint deformities Skin: no rashes Neurological: Weakness but otherwise nonfocal  IMPRESSION: Patient's schedule clinic visit today was initially canceled as patient was admitted to hospice last week.  However, patient and son presented to the clinic to be seen.  We had another conversation regarding her goals.  Patient confirmed that she is not interested in pursuing treatment at this point.  She understands that that would be an exclusion for hospice involvement.  Both patient and son verbalized a desire to focus on comfort and quality of life at home under hospice care.  We discussed not scheduling routine follow-up visits at the present time but patient may utilize the clinic as needed.  Most often under hospice care, patient can be adequately managed at home without the need to present to the clinic.  I discussed with and reviewed ACP documents with both patient and son.  They plan to have those documents completed and notarized and will provide [place] a copy.  We did discuss CODE STATUS.  Patient verbalized clearly that she would not want her life prolonged artificially on machines nor would she want to be resuscitated at end-of-life.  Son said that he agreed with that decision.  I completed a DNR order for her to take home.  Case and plan discussed with Dr. Grayland Ormond.    PLAN: -Continue current scope  of treatment -Continue oxycodone as needed for pain -MOST form reviewed -RTC in 2 weeks  Patient expressed understanding and was in agreement with this plan. She also understands that She can call clinic at any time with any questions, concerns, or complaints.    Time Total: 20 minutes  Visit consisted of counseling and education dealing with the complex and emotionally intense issues of symptom management and palliative care in the setting of serious and potentially life-threatening illness.Greater than 50%  of this time was spent counseling and coordinating care related to the above assessment and plan.  Signed by:  Altha Harm, PhD, DNP, NP-C, [facility] [phone] (Work Cell)

## 2019-06-29 NOTE — Telephone Encounter (Signed)
Called today and referral was received and they have her in their services

## 2019-06-30 NOTE — Progress Notes (Signed)
[place]  [phone] [phone]  ID: Janet [name] OB: 04-27-1956  MR#: [id number]  WPY#:[id number]  Patient Care Team: Volney American, PA-C as PCP - General (Family Medicine) [name], RN as Registered Nurse  CHIEF COMPLAINT: Progressive stage IV adenocarcinoma of the lung with metastatic disease in bones and liver.    INTERVAL HISTORY: Patient returns to clinic today as an add-on to further discuss her decision to enroll in hospice.  She continues to have chronic weakness and fatigue and declining performance status. Her peripheral neuropathy is unchanged.  She has no other neurologic complaints.  She has a fair appetite and her weight has remained relatively stable. She denies any pain. She denies any recent fevers or illnesses. She denies any chest pain, shortness of breath, hemoptysis, or cough.  She has no nausea, vomiting, constipation, or diarrhea.  She has no melena or hematochezia.  She has no urinary complaints.  Patient offers no further specific complaints today.  REVIEW OF SYSTEMS:   Review of Systems  Constitutional: Positive for malaise/fatigue. Negative for fever and weight loss.  Respiratory: Negative.  Negative for cough and shortness of breath.   Cardiovascular: Negative.  Negative for chest pain and leg swelling.  Gastrointestinal: Negative.  Negative for abdominal pain, blood in stool, diarrhea and melena.  Genitourinary: Negative.  Negative for dysuria and flank pain.  Musculoskeletal: Negative.  Negative for back pain and joint pain.  Skin: Negative.  Negative for rash.  Neurological: Positive for tingling, sensory change and weakness. Negative for dizziness, focal weakness and headaches.  Psychiatric/Behavioral: Negative.  Negative for depression. The patient is not nervous/anxious.     As per HPI. Otherwise, a complete review of systems is negative.  PAST MEDICAL HISTORY: Past Medical History:   Diagnosis Date  . Arthritis    right shoulder  . Cancer of right lung ([place]) 08/2017   Chemo + rad tx's.   Marland Kitchen Hyperlipidemia   . Hypertension   . Hypothyroidism   . Menopausal state   . Personal history of chemotherapy 2019   lung ca  . Personal history of radiation therapy 2019   lung ca  . Thyroid disease     PAST SURGICAL HISTORY: Past Surgical History:  Procedure Laterality Date  . ABDOMINAL HYSTERECTOMY  2005  . BREAST EXCISIONAL BIOPSY Right 1979   benign  . COLONOSCOPY WITH PROPOFOL N/A 05/13/2017   Procedure: COLONOSCOPY WITH PROPOFOL;  Surgeon: [name], MD;  Location: [facility] ENDOSCOPY;  Service: Gastroenterology;  Laterality: N/A;  . CYSTECTOMY Right    breast  . CYSTECTOMY  02/2015   back of neck  . ESOPHAGOGASTRODUODENOSCOPY (EGD) WITH PROPOFOL N/A 05/13/2017   Procedure: ESOPHAGOGASTRODUODENOSCOPY (EGD) WITH PROPOFOL;  Surgeon: [name], MD;  Location: [facility] ENDOSCOPY;  Service: Gastroenterology;  Laterality: N/A;  . PORTA CATH INSERTION N/A 09/01/2017   Procedure: PORTA CATH INSERTION;  Surgeon: Algernon Huxley, MD;  Location: [place] CV LAB;  Service: Cardiovascular;  Laterality: N/A;  . TUBAL LIGATION  1986    FAMILY HISTORY: Family History  Problem Relation Age of Onset  . Hypertension Mother   . Stroke Mother   . Leukemia Mother   . Stroke Father   . Pneumonia Father   . Diabetes Sister   . Hyperlipidemia Sister   . Breast cancer Maternal Aunt 25    ADVANCED DIRECTIVES (Y/N):  N  HEALTH MAINTENANCE: Social History   Tobacco Use  . Smoking status: Former Smoker  Packs/day: 0.25    Types: Cigarettes    Quit date: 02/01/2017    Years since quitting: 2.4  . Smokeless tobacco: Never Used  Substance Use Topics  . Alcohol use: No    Alcohol/week: 0.0 standard drinks  . Drug use: No     Colonoscopy:  PAP:  Bone density:  Lipid panel:  No Known Allergies  Current Outpatient Medications  Medication Sig Dispense Refill  . DULoxetine  (CYMBALTA) 30 MG capsule Take 1 capsule (30 mg total) by mouth daily. 30 capsule 3  . levothyroxine (SYNTHROID) 75 MCG tablet Take 1 tablet (75 mcg total) by mouth daily. 90 tablet 0  . lidocaine-prilocaine (EMLA) cream APPLY EXTERNALLY TO THE AFFECTED AREA 1 TIME 30 g 1  . Oxycodone HCl 10 MG TABS Take 1 tablet (10 mg total) by mouth every 6 (six) hours as needed. 120 tablet 0  . pantoprazole (PROTONIX) 40 MG tablet Take 1 tablet (40 mg total) by mouth daily. 30 tablet 0  . potassium chloride 20 MEQ/15ML (10%) SOLN Take 15 mLs (20 mEq total) by mouth 2 (two) times daily. 473 mL 0   No current facility-administered medications for this visit.    Facility-Administered Medications Ordered in Other Visits  Medication Dose Route Frequency Provider Last Rate Last Dose  . sodium chloride flush (NS) 0.9 % injection 10 mL  10 mL Intravenous PRN Lloyd Huger, MD   10 mL at 03/09/19 0830    OBJECTIVE: Vitals:   06/29/19 1323  BP: 111/74  Pulse: 97  Resp: 15  Temp: (!) 97.5 F (36.4 C)  SpO2: 99%     Body mass index is 20.39 kg/m.    ECOG FS:2 - Symptomatic, <50% confined to bed  General: Thin, no acute distress.  Sitting in a wheelchair. Eyes: Pink conjunctiva, anicteric sclera. HEENT: Normocephalic, moist mucous membranes, clear oropharnyx. Lungs: Clear to auscultation bilaterally. Heart: Regular rate and rhythm. No rubs, murmurs, or gallops. Abdomen: Soft, nontender, nondistended. No organomegaly noted, normoactive bowel sounds. Musculoskeletal: No edema, cyanosis, or clubbing. Neuro: Alert, answering all questions appropriately. Cranial nerves grossly intact. Skin: No rashes or petechiae noted. Psych: Normal affect.  LAB RESULTS:  Lab Results  Component Value Date   NA 138 06/15/2019   K 4.1 06/15/2019   CL 104 06/15/2019   CO2 28 06/15/2019   GLUCOSE 114 (H) 06/15/2019   BUN 31 (H) 06/15/2019   CREATININE 1.58 (H) 06/15/2019   CALCIUM 8.6 (L) 06/15/2019   PROT 6.2  (L) 06/15/2019   ALBUMIN 2.6 (L) 06/15/2019   AST 42 (H) 06/15/2019   ALT 18 06/15/2019   ALKPHOS 63 06/15/2019   BILITOT 0.3 06/15/2019   GFRNONAA 34 (L) 06/15/2019   GFRAA 40 (L) 06/15/2019    Lab Results  Component Value Date   WBC 2.6 (L) 06/15/2019   NEUTROABS 1.7 06/15/2019   HGB 9.4 (L) 06/15/2019   HCT 29.7 (L) 06/15/2019   MCV 106.8 (H) 06/15/2019   PLT 152 06/15/2019     STUDIES: No results found. ONCOLOGY TREATMENT HISTORY: Patient initiated treatment with concurrent weekly carboplatin and Taxol along with XRT from September 09, 2017 through October 07, 2017.  She then received maintenance Imfinzi from November 04, 2017 through March 10, 2018 at which point she was noted to have progressive disease.  Treatment was then switched to carboplatinum, pemetrexed, and Avastin which she received between April 01, 2018 and August 25, 2018.  She then received maintenance pemetrexed and Avastin from September 15, 2018 through November 23, 2018 at which point she was noted to have progressive disease once again.  She was initiated on third line treatment with Taxotere and Cyramza on Dec 15, 2018 this was discontinued on May 11, 2019 secondary to progressive disease.  Fourth line vinorelbine was initiated on June 08, 2019.  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     PLAN:    1. 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  See treatment history above.  CT scan results from May 28, 2019 reviewed progressive disease yet again.  Previously, MRI of her brain on January 09, 2019 did not reveal any metastatic disease.  Patient received 2 doses of vinorelbine last on June 15, 2019.  Approximately 1 week ago she elected to enroll in hospice.  Patient expressed understanding that no further treatments are planned.  No  follow-up has been scheduled.  Appreciate palliative care input.   2.  Anemia: Patient's most recent hemoglobin was 9.4. 3.  Pain: Patient does not complain of pain today.  Continue current narcotic regimen as prescribed. 4.  Left shoulder mobility: Appreciate rehab input. 5.  Hypokalemia: Resolved.  Continue oral supplementation. 6.  Dysphasia: Patient does not complain of this today.  Swallow study and CT scan of the neck are negative.  Continue follow-up with GI.  MRI of the brain was negative as above.  7.  Chronic renal insufficiency: Chronic and unchanged.  Patient's most recent creatinine is 1.58.   8.  Hypomagnesia: Resolved. 10.  Bony metastasis: Patient last received Zometa on May 11, 2019. 11.  Leukopenia: Secondary to chemotherapy.    Patient is DNR/DNI.  Patient expressed understanding and was in agreement with this plan. She also understands that She can call clinic at any time with any questions, concerns, or complaints.   Cancer Staging Cancer of upper lobe of right lung [facility]) Staging form: Lung, AJCC 8th Edition - Clinical stage from 08/24/2017: Stage IV (cT2a, cN3, cM1c) - Signed by Lloyd Huger, MD on 03/29/2018   Lloyd Huger, MD   06/30/2019 6:24 AM

## 2019-06-30 NOTE — Progress Notes (Signed)
This encounter was created in error - please disregard.

## 2019-10-14 ENCOUNTER — Ambulatory Visit: Payer: Self-pay | Admitting: Family Medicine

## 2019-10-15 ENCOUNTER — Telehealth: Payer: Medicaid Other | Admitting: Family Medicine

## 2019-10-15 ENCOUNTER — Ambulatory Visit: Payer: Self-pay | Admitting: Family Medicine

## 2019-12-04 DEATH — deceased
